# Patient Record
Sex: Female | Born: 1970 | Race: White | Hispanic: No | Smoking: Current every day smoker
Health system: Southern US, Community
[De-identification: ages and names within clinical notes are randomized; demographics above are authoritative.]

## PROBLEM LIST (undated history)

## (undated) DIAGNOSIS — R519 Headache, unspecified: Secondary | ICD-10-CM

## (undated) DIAGNOSIS — Z8619 Personal history of other infectious and parasitic diseases: Secondary | ICD-10-CM

## (undated) DIAGNOSIS — L0390 Cellulitis, unspecified: Secondary | ICD-10-CM

## (undated) DIAGNOSIS — A630 Anogenital (venereal) warts: Secondary | ICD-10-CM

## (undated) DIAGNOSIS — F32A Depression, unspecified: Secondary | ICD-10-CM

## (undated) DIAGNOSIS — F329 Major depressive disorder, single episode, unspecified: Secondary | ICD-10-CM

## (undated) DIAGNOSIS — F3181 Bipolar II disorder: Secondary | ICD-10-CM

## (undated) DIAGNOSIS — L709 Acne, unspecified: Secondary | ICD-10-CM

## (undated) DIAGNOSIS — E2740 Unspecified adrenocortical insufficiency: Secondary | ICD-10-CM

## (undated) DIAGNOSIS — R51 Headache: Secondary | ICD-10-CM

## (undated) DIAGNOSIS — E039 Hypothyroidism, unspecified: Secondary | ICD-10-CM

## (undated) HISTORY — DX: Hypothyroidism, unspecified: E03.9

## (undated) HISTORY — DX: Depression, unspecified: F32.A

## (undated) HISTORY — DX: Personal history of other infectious and parasitic diseases: Z86.19

## (undated) HISTORY — PX: INGUINAL HERNIA REPAIR: SUR1180

## (undated) HISTORY — DX: Major depressive disorder, single episode, unspecified: F32.9

## (undated) HISTORY — PX: TONSILLECTOMY: SUR1361

## (undated) HISTORY — DX: Anogenital (venereal) warts: A63.0

## (undated) HISTORY — PX: UMBILICAL HERNIA REPAIR: SHX196

---

## 1997-12-22 ENCOUNTER — Other Ambulatory Visit: Admission: RE | Admit: 1997-12-22 | Discharge: 1997-12-22 | Payer: Self-pay | Admitting: Obstetrics and Gynecology

## 2006-04-20 ENCOUNTER — Inpatient Hospital Stay (HOSPITAL_COMMUNITY): Admission: AD | Admit: 2006-04-20 | Discharge: 2006-04-27 | Payer: Self-pay | Admitting: Psychiatry

## 2006-04-20 ENCOUNTER — Ambulatory Visit: Payer: Self-pay | Admitting: Psychiatry

## 2009-01-30 ENCOUNTER — Inpatient Hospital Stay (HOSPITAL_COMMUNITY): Admission: AD | Admit: 2009-01-30 | Discharge: 2009-01-30 | Payer: Self-pay | Admitting: Obstetrics & Gynecology

## 2009-02-15 ENCOUNTER — Inpatient Hospital Stay (HOSPITAL_COMMUNITY): Admission: AD | Admit: 2009-02-15 | Discharge: 2009-02-15 | Payer: Self-pay | Admitting: Obstetrics

## 2009-05-12 ENCOUNTER — Encounter (INDEPENDENT_AMBULATORY_CARE_PROVIDER_SITE_OTHER): Payer: Self-pay | Admitting: Obstetrics & Gynecology

## 2009-05-12 ENCOUNTER — Inpatient Hospital Stay (HOSPITAL_COMMUNITY): Admission: AD | Admit: 2009-05-12 | Discharge: 2009-05-15 | Payer: Self-pay | Admitting: Obstetrics & Gynecology

## 2010-06-07 LAB — CBC
HCT: 27.6 % — ABNORMAL LOW (ref 36.0–46.0)
Hemoglobin: 12.3 g/dL (ref 12.0–15.0)
MCV: 99.1 fL (ref 78.0–100.0)
MCV: 99.3 fL (ref 78.0–100.0)
Platelets: 203 10*3/uL (ref 150–400)
Platelets: 261 10*3/uL (ref 150–400)
RBC: 3.62 MIL/uL — ABNORMAL LOW (ref 3.87–5.11)
RDW: 12.6 % (ref 11.5–15.5)
RDW: 12.7 % (ref 11.5–15.5)
WBC: 10.5 10*3/uL (ref 4.0–10.5)
WBC: 8.8 10*3/uL (ref 4.0–10.5)

## 2010-06-07 LAB — RPR: RPR Ser Ql: NONREACTIVE

## 2010-06-15 LAB — FETAL FIBRONECTIN: Fetal Fibronectin: NEGATIVE

## 2010-06-16 LAB — URINALYSIS, ROUTINE W REFLEX MICROSCOPIC
Glucose, UA: NEGATIVE mg/dL
Ketones, ur: NEGATIVE mg/dL
Nitrite: NEGATIVE
Specific Gravity, Urine: 1.01 (ref 1.005–1.030)
Urobilinogen, UA: 0.2 mg/dL (ref 0.0–1.0)

## 2010-07-30 NOTE — Discharge Summary (Signed)
NAMEEMERSON, BARRETTO NO.:  [id number]   MEDICAL RECORD NO.:  [id number]          PATIENT TYPE:  IPS   LOCATION:  0305                          FACILITY:  [facility]   PHYSICIAN:  Anselm Jungling, MD  DATE OF BIRTH:  Dec 30, 1970   DATE OF ADMISSION:  04/20/2006  DATE OF DISCHARGE:  04/27/2006                               DISCHARGE SUMMARY   IDENTIFYING DATA/REASON FOR ADMISSION:  This was an inpatient  psychiatric admission for Tina [name], a 40 year old single white female  admitted because of increasing depression, mood instability, and  irritability.  She was referred by her psychiatrist, Dr. Tomasa [name].  She had recently been begun on a trial of Lamictal, along with Depakote  and p.r.n. Klonopin.  She had a psychiatric history that stemmed back to  her teenage years.  She had had many, many unsuccessful psychotropic  medication trials in the past, including those of Tegretol, imipramine,  and Librium.  Please refer to the admission note for further details  pertaining to the symptoms, circumstances and history that led to her  hospitalization.   INITIAL DIAGNOSTIC IMPRESSION:  She was given an initial AXIS I  diagnosis of bipolar disorder not otherwise specified, depressed versus  mixed, without psychotic features.   MEDICAL/LABORATORY:  The patient was medically and physically assessed  by the psychiatric nurse practitioner.  Admission laboratory included a  negative pregnancy test, and CBC, electrolytes and urinalysis all within  normal limits.  There were no significant medical issues.   HOSPITAL COURSE:  The patient was admitted to the adult inpatient  psychiatric service.  She presented as a well-nourished, well-developed  adult female who was alert and fully oriented.  She was depressed, sad  and tearful.  Her thoughts and speech were normally organized.  There  were no signs or symptoms of psychosis or thought disorder.  She denied  suicidal intent or plan  at the time of admission and throughout the  remainder of her stay.  She verbalized a strong desire for help.   The patient had just been started on Lamictal shortly prior to  admission.  However, her initial Depakote level was subtherapeutic.  Because of this, it was felt that it made more sense to continue  Depakote and get it to therapeutic level, before proceeding ahead with a  full trial of Lamictal.  Also, to address depressive symptoms, she was  started on Prozac 10 mg daily.  Depakote was given at 1500 mg daily.  These appeared to be well-tolerated.   The patient participated in various therapeutic groups and activities  and was a good participant in treatment program.   On the fourth hospital day, there was a family session involving the  patient's husband, sister and mother.  Main concerns were around  providing education to the family and bipolar disorder, and its  medication management, as well as coping skills that the patient could  use to reduce stress after discharge.  Information sheets were given to  the patient and her family.  It was explained to the family that the  current treatment efforts  included adjustment of medications towards  better mood stability.  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  The patient spoke about  being across the street from her sister who also has small children and  utilizing that sister as a strong support.  She also spoke about finding  support groups for her and her family to attend.  The patient also  talked about wanting to find a different psychiatrist, and needing  referral to an individual therapist.  They discussed the possibility of  she getting her services at the [facility].  The family and the  patient were given information on suicide prevention as well as the  suicide prevention pamphlet and the crisis hotline numbers card.   The patient continued in the  program further for four more days.  She  complained during that time that she did not feel she was making much  progress.  However, she remained absent suicidal ideation and, on the  eighth hospital day, indicated that she felt ready for discharge.  She  agreed to the following aftercare plan.  The patient was to follow up at  the [facility], with an appointment on the day after discharge,  April 28, 2006, and another appointment at the [facility]  scheduled for May 09, 2006.   DISCHARGE MEDICATIONS:  1. Depakote ER 1500 mg q.h.s.  2. Prozac 10 mg daily.   DISCHARGE DIAGNOSES:  AXIS I:  Bipolar disorder not otherwise specified,  currently depressed without psychotic features.  AXIS II:  Deferred.  AXIS III:  No acute or chronic illnesses.  AXIS IV:  Stressors:  Severe.  AXIS V:  GAF on discharge 65.      Anselm Jungling, MD  Electronically Signed     SPB/MEDQ  D:  04/30/2006  T:  04/30/2006  Job:  [id number]

## 2010-07-30 NOTE — H&P (Signed)
NAMEARLENY, Tina [name] NO.:  [id number]   MEDICAL RECORD NO.:  [id number]          PATIENT TYPE:  IPS   LOCATION:  0305                          FACILITY:  [facility]   PHYSICIAN:  Anselm Jungling, MD  DATE OF BIRTH:  [birth year]   DATE OF ADMISSION:  04/20/2006  DATE OF DISCHARGE:                       PSYCHIATRIC ADMISSION ASSESSMENT   IDENTIFYING INFORMATION:  This is a 41 year old married white female.  This is a voluntary admission.   HISTORY OF PRESENT ILLNESS:  This 69 year old mother of 2 presented as a  walk-in yesterday referred by her primary outpatient psychiatrist for  severe mood swings, tearfulness, some impulsive anger, and  intrusiveness.  She had been calling his office frequently, crying,  upset.  He felt that she was not safe, possibly out of control.  The  patient herself endorses that she has had some wild mood swings over the  course of the past 2 weeks and is unable to cite any trigger except for  the fact that she had had a medication change, stopping her Wellbutrin  and her Lexapro and starting on Depakote.  She reports agitation,  variable sleep, feeling irritable, angry, a lot of tearfulness.  Yesterday had gotten upset with her husband, threatened to leave, locked  herself in the car and cried, then regretted her actions.  She  attributes the sleep problem also to the possibility of being the mother  of a 50-year-old and stressed out with parenting issues.  She was  agitated and oppositional with the nursing staff last night.  She  endorses some feelings of guarding and paranoia and is remorseful about  that today, believing that the nurses were against her.  She refused to  take medications last night.  She has a history of a prior overdose  attempt as a teen, denies having any suicidal ideation today, but is  concerned about her own impulsivity and mood swings, feeling unsafe.   PAST PSYCHIATRIC HISTORY:  The patient is currently followed  by Dr.  Tiajuana Amass here in [place].  This is her first admission to  [facility].  She does have a history of bipolar  disorder with prior treatment with Tegretol which stabilized her, which  she stopped prior to her last pregnancy about 18 months ago.  Since the  birth of her child a year ago, she was then placed on Lexapro 10 mg and  Wellbutrin XL 300 mg p.o. q.a.m. which was stopped a couple of weeks ago  when she was started on Depakote 1000 mg and within the last couple days  she has added Lamictal.  She has a history of at least 1 prior suicide  attempt as a teen and 2 hospitalizations at [facility] in the past  and a hospitalization in [place] in the past.   FAMILY HISTORY:  Remarkable for sister and mother with a history of  depression.   MEDICAL HISTORY:  The patient's primary care physician is [facility].  Medical problems are essentially none.  Past medical history  is remarkable for 2 prior C-sections.  This  is a healthy white female in  no distress, 5 feet tall, 122 pounds, temperature 97.5, pulse 55,  respirations 16, blood pressure 112/68.   REVIEW OF SYSTEMS:  Remarkable for sleep been decreased to about 1-2  hours at a time.  Hard to know if this is partially attributable to  childcare obligations.  She has a headache which she grades a 6-8/10  generalized across her head.  No throbbing she attributes this to  possible caffeine withdrawal.  Weight is stable.  No history of fever,  chills.  No dysuria, shortness of breath.  Bowels are regular every day  to every other day.  No dysuria, denies muscle aches or pains.  No joint  pains or swelling.  No tobacco use.  The patient denies any drug or  alcohol abuse.  Her weight is relatively stable at 122 pounds.   PAST MEDICAL HISTORY:  Remarkable for 2 C-sections; no history of  blackouts, memory loss, coma, or brain injury.  Hospitalizations for  childbirth only.   FULL  PHYSICAL EXAM:  GENERAL:  Well-nourished, well-developed female in  no acute distress, petite build.  Hygiene is adequate.  HEAD:  Normocephalic.  EENT:  PERRL.  Sclera nonicteric.  Extraocular movements are normal, no  rhinorrhea.  Oropharynx within normal limits.  No a.c. or p.c. nodes  palpable.  NECK:  Supple.  No thyromegaly.  CHEST:  Clear to auscultation.  BREAST EXAM:  Deferred.  CARDIOVASCULAR:  S1-S2 is heard.  No clicks, murmurs, or gallops.  ABDOMEN:  Soft, nontender, nondistended.  GENITOURINARY:  Deferred.  EXTREMITIES:  No clubbing, no cyanosis.  Peripheral pulses are 2+.  SKIN:  Pale in tone.  No signs of self-mutilation, no remarkable  lesions, no rashes.  NEUROLOGIC:  Cranial nerves 2-12 are intact.  Sensory and motor are intact.  Gait is within normal limits.  Romberg  without findings.  Cerebellar functions intact.  Neuro is nonfocal.   DIAGNOSTIC STUDIES:  CBC:  WBC 5.4, hemoglobin 13.2, hematocrit 36.2,  platelets 228,000.  Sodium 142, potassium 3.9, chloride 107, carbon  dioxide 30, BUN 15, creatinine 0.72, and random glucose is 84.  Liver  enzymes:  SGOT 18, SGPT 17, alkaline phosphatase is 65, and total  bilirubin is 0.7.  Urine drug screen negative for all substances.  Urine  pregnancy test is negative.  Depakote level is currently pending, and  routine UA is within normal limits.  TSH 3.802.   CURRENT MEDICATIONS:  1. Depakote 1000 mg ER p.o. q.h.s.  2. Klonopin 0.25 mg p.o. q.6 h p.r.n.  3. Multivitamin daily.  4. Lamictal.  She had been on a starter pack for the last 3 days 25 mg      p.o. every other day.   DRUG ALLERGIES:  VANCOMYCIN, PENICILLIN AND SULFA ALL OF WHICH CAUSE  RASH.   MENTAL STATUS EXAM:  Fully alert female, tearful, mildly agitated,  distractible but cooperative, directable.  Speech is normal in pace,  tone, amount, and production.  She is having some difficulty maintaining her train of thought, though, easily derailed.  Mood is  mildly agitated,  depressed.  Thought process logical, coherent.  Concentration is  decreased.  Calculation is decreased.  Impulse control is impaired.  Judgment is adequate, intellect within normal limits.   AXIS I:  Rule out bipolar disorder NOS, mixed state.  AXIS II:  Deferred.  AXIS III:  Headache NOS.  AXIS IV:  Moderate to severe parenting stress.  AXIS V:  Current  28 past year 72.   PLAN:  Voluntarily admit the patient with q. 15-minute checks in place.  We are going to check an a.m. Depakote level on her fasting.  We are  going to discontinue the Lamictal at this point and will make Geodon 20  mg IM  available for any acute agitation or 80 mg p.o. b.i.d. p.r.n. for acute  agitation until we can get her stabilized on the Depakote which she to.  We will not use any IM medications if she agrees to p.o.'s.  Meanwhile,  we are going to give her some ibuprofen for her headache.  Estimated  length of stay is 5 days.      Margaret A. Lorin Picket, [name]      Anselm Jungling, MD  Electronically Signed    MAS/MEDQ  D:  04/21/2006  T:  04/21/2006  Job:  [id number]

## 2010-09-29 ENCOUNTER — Inpatient Hospital Stay (HOSPITAL_COMMUNITY)
Admission: RE | Admit: 2010-09-29 | Discharge: 2010-10-06 | DRG: 430 | Disposition: A | Discharge status: Home / Self Care | Payer: BC Managed Care – PPO | Attending: Psychiatry | Admitting: Psychiatry

## 2010-09-29 DIAGNOSIS — Z882 Allergy status to sulfonamides status: Secondary | ICD-10-CM

## 2010-09-29 DIAGNOSIS — Z560 Unemployment, unspecified: Secondary | ICD-10-CM

## 2010-09-29 DIAGNOSIS — F3160 Bipolar disorder, current episode mixed, unspecified: Principal | ICD-10-CM

## 2010-09-29 DIAGNOSIS — Z79899 Other long term (current) drug therapy: Secondary | ICD-10-CM

## 2010-09-29 DIAGNOSIS — Z880 Allergy status to penicillin: Secondary | ICD-10-CM

## 2010-09-29 DIAGNOSIS — R45851 Suicidal ideations: Secondary | ICD-10-CM

## 2010-09-29 LAB — CBC
Hemoglobin: 13.5 g/dL (ref 12.0–15.0)
MCV: 87.6 fL (ref 78.0–100.0)
Platelets: 255 10*3/uL (ref 150–400)
RBC: 4.35 MIL/uL (ref 3.87–5.11)
RDW: 13.4 % (ref 11.5–15.5)
WBC: 7.4 10*3/uL (ref 4.0–10.5)

## 2010-09-29 LAB — COMPREHENSIVE METABOLIC PANEL
CO2: 22 mEq/L (ref 19–32)
Calcium: 9.6 mg/dL (ref 8.4–10.5)
Chloride: 104 mEq/L (ref 96–112)
GFR calc non Af Amer: >60 mL/min (ref >60)
Total Protein: 7.4 g/dL (ref 6.0–8.3)

## 2010-09-30 DIAGNOSIS — F3189 Other bipolar disorder: Secondary | ICD-10-CM

## 2010-10-02 LAB — T3 UPTAKE: T3 Uptake Ratio: 31.4 % (ref 22.5–37.0)

## 2010-10-02 LAB — TSH: TSH: 0.015 u[IU]/mL — ABNORMAL LOW (ref 0.350–4.500)

## 2010-10-02 LAB — T4, FREE: Free T4: 0.36 ng/dL — ABNORMAL LOW (ref 0.80–1.80)

## 2010-10-05 NOTE — Assessment & Plan Note (Signed)
Tina [name], PINON NO.:  [id number]  MEDICAL RECORD NO.:  [id number]  LOCATION:  0503                          FACILITY:  [facility]  PHYSICIAN:  [name], MD     DATE OF BIRTH:  [birth year]  DATE OF ADMISSION:  09/29/2010 DATE OF DISCHARGE:                      PSYCHIATRIC ADMISSION ASSESSMENT   REASON FOR ADMISSION:  Patient was admitted to [facility] for evaluation of her depression and possible suicidal thoughts.  She was thinking about cutting herself, reporting stressors at home that she did not elaborate on.  She was reporting problems with increasing sleep and increasing appetite and having severe depressive symptoms.  Denied any psychotic symptoms.  She has been compliant with her medications. Patient reports problems of crying spells, severe irritability, isolation, erratic sleep, feeling hopeless, and suicidal thoughts.  PAST PSYCHIATRIC HISTORY:  First admission to [facility]. She sees Dr. Derrek Gu who is managing her psychotropic medications.  SOCIAL HISTORY:  Patient is married.  She is unemployed.  She resides in [place] and she is a stay-at-home mom.  FAMILY HISTORY:  None.  ALCOHOL AND DRUG HISTORY:  Denies any alcohol or substance use.  PRIMARY CARE PROVIDER:  Dr. Derrek Gu.  MEDICAL PROBLEMS:  No known medical conditions.  MEDICATIONS: 1. Adderall 20 mg taking one b.i.d. 2. Symbyax 6/50 one q.h.s. 3. Topamax 100 mg taking two q.h.s. 4. Lamictal 100 mg taking two q.h.s. 5. Cytomel 50 mg one daily. 6. Wellbutrin XL 450 mg daily. 7. Probiotics daily. 8. Multivitamin daily. 9. Fish oil daily.  PHYSICAL EXAM:  This is a well-nourished middle-aged female who appears in no distress. HEAD:  Atraumatic, normocephalic. NECK:  Negative lymphadenopathy.  Neck is supple. CHEST:  Clear.  No wheezing.  No rales. BREAST:  Exam deferred. HEART:  Regular rate and rhythm.  No murmurs, gallops, or  rubs. ABDOMEN:  Soft, nontender abdomen. PELVIC:  Exam is deferred. GU:  Exam is deferred. EXTREMITIES:  Moves all extremities.  No clubbing.  No deformities. Pulses intact. SKIN:  No rashes or lacerations. NEUROLOGIC:  Findings are intact.  LABORATORY DATA:  Shows a TSH of 0.014.  CBC and CMP are within normal limits.  T4 of 0.36.  MENTAL STATUS EXAM:  Patient is alert and somewhat cooperative, lying in bed.  Her speech is soft spoken.  It took several attempts to get her to arouse.  She has poor eye contact at times and does not provide much history at this time but endorsing stressors.  Her thought process are coherent, goal directed, promises safety.  AXIS I:  Bipolar 1 disorder mixed, under poor control. AXIS II:  Deferred. AXIS III:  No known medical conditions. AXIS IV:  Psychosocial problems related to chronic mental illness. AXIS V:  Current is 35.  PLAN:  Review her medications, contact her support group, address her stressors, and increase her coping skills.  Patient to attend groups.  TENTATIVE LENGTH OF STAY:  At this time, is 4 to 6 days.     [name], [name]   ______________________________ [name], MD    JO/MEDQ  D:  10/03/2010  T:  10/03/2010  Job:  [id number]  Electronically Signed  by Limmie PatriciaP. on 10/05/2010 01:54:48 PM Electronically Signed by [name] MD on 10/05/2010 04:58:32 PM

## 2010-10-08 NOTE — Discharge Summary (Signed)
Tina [name], [name]:  [id number]  MEDICAL RECORD NO.:  [id number]  LOCATION:  0503                          FACILITY:  BH  PHYSICIAN:  [name], MD     DATE OF BIRTH:  [birth year]  DATE OF ADMISSION:  09/29/2010 DATE OF DISCHARGE:  10/06/2010                              DISCHARGE SUMMARY   REASON FOR ADMISSION:  This was a 40 year old female who was admitted for evaluation of her depression and suicidal thoughts.  She was thinking about cutting herself, reporting stressors at home that she did not elaborate on, having problems with increasing sleep, increasing appetite and having severe depressive symptoms, reporting crying spells, severe irritability, isolation, erratic sleep, feeling hopeless and again, suicidal thoughts.  FINAL IMPRESSION:   Axis I:  Bipolar I disorder, mixed, under poor control. Axis II:  Deferred. Axis III:  No known medical conditions. Axis IV:  Psychosocial problems related to chronic mental illness. Axis V:  Current is 55-60.  PERTINENT LABS:  CBC and CMP were within normal limits.  T4 of 0.36 and a TSH of 0.014.  SIGNIFICANT FINDINGS:  The patient was admitted to the adult milieu for safety and stabilization.  We reviewed her medications. Continued to identify her stressors and increased coping skills.  We contacted her family for support and concerns.  She was reporting good sleep.  Her appetite was decreased.  Having severe depressive symptoms, rating it a 10 on a scale of 1-10.  Had no suicidal thoughts on July 20th.  No psychotic symptoms.  She was tolerating her medications.  We initiated Effexor 37.5 mg to address her depressive symptoms and continued to monitor her mood and affect.  We contacted her husband. Reuel Boom, to assess any safety concerns and for [place] to provide information.  Husband reported that the patient was "fixated" on cutting herself and did not see much progress at that time and reported that  the patient was not normally suicidal.  On the weekend prior to discharge, the patient's sleep was improving.  Her appetite was improving.  She was rating her depression a 5.  Had no suicidal thoughts at that time, but she did state that she gets suicidal when she gets upset.  She denied any homicidal ideation.  On Sunday, she was feeling down with no reason, although she slept well and her appetite was good. She had no medication side effects.  She states that she did get angry at her sister who had told others that she was admitted to the hospital.  On July 24th, the patient's sleep was good.  Her appetite was good. Rating her depression a 1-1/2 on a scale of 1-10.  Denied any suicidal or homicidal thoughts.  She was expressing some anxiety that her good mood will go away.  We had some Klonopin available as needed for anxiety and continued with her other medication regimen.  Endocrinology was contacted to review the patient's thyroid results and to gather any further recommendations.  They felt that discontinuing her Cytomel and placing the patient on Armour Thyroid and having her thyroid values rechecked in approximately 4-6 weeks was appropriate.  On day of discharge, the  patient was seen in the interdisciplinary treatment team to address any safety issues.  The patient felt she was ready to be discharged.  She was reporting some anxiety about going home, that her depression would return, but she was reporting good sleep and good appetite.  Her depression had resolved, rating it a 1, adamantly denying any suicidal or homicidal thoughts.  Denying any auditory hallucinations or delusional thinking.  Her anxiety was mild, rating it a 3, and having no medication side effects.  DISCHARGE MEDICATIONS:  Included: 1. Klonopin 0.5 mg taking 1/2 tablet q.8 hours p.r.n. for anxiety. 2. Prozac 10 mg 1 daily that she was to take along with her Symbyax. 3. Armour Thyroid 30 mg 1 tablet daily. 4.  Effexor XR 75 mg 1 daily. 5. Wellbutrin XL 450 mg total. 6. Adderall 20 mg 1 b.i.d. 7. Lamictal 100 mg taking 2 q.h.s. 8. Multivitamin daily. 9. Home medicine of n-acetylcysteine 1000 mg b.i.d. 10.Symbyax 6/50 one q.h.s. 11.Topamax 100 mg taking 2 q.h.s.  The patient was to stop taking her Cytomel.  FOLLOWUP APPOINTMENT:  With [facility] with Dr. Dub Mikes with an appointment on November 16, 2010, and [facility] on Thursday, July 26, at 10:30.     Tina [name], [name]   ______________________________ [name], MD    JO/MEDQ  D:  10/07/2010  T:  10/07/2010  Job:  [id number]  Electronically Signed by Limmie PatriciaP. on 10/08/2010 10:51:01 AM Electronically Signed by [name] MD on 10/08/2010 04:32:55 PM

## 2010-10-17 ENCOUNTER — Ambulatory Visit (HOSPITAL_COMMUNITY)
Admission: RE | Admit: 2010-10-17 | Discharge: 2010-10-17 | Disposition: A | Discharge status: Home / Self Care | Payer: BC Managed Care – PPO | Source: Ambulatory Visit | Attending: Psychiatry | Admitting: Psychiatry

## 2010-10-17 ENCOUNTER — Emergency Department (HOSPITAL_COMMUNITY)
Admission: EM | Admit: 2010-10-17 | Discharge: 2010-10-18 | Disposition: A | Discharge status: Other Acute Inpatient Hospital | Payer: BC Managed Care – PPO | Attending: Emergency Medicine | Admitting: Emergency Medicine

## 2010-10-17 DIAGNOSIS — X789XXA Intentional self-harm by unspecified sharp object, initial encounter: Secondary | ICD-10-CM | POA: Insufficient documentation

## 2010-10-17 DIAGNOSIS — S61509A Unspecified open wound of unspecified wrist, initial encounter: Secondary | ICD-10-CM | POA: Insufficient documentation

## 2010-10-17 DIAGNOSIS — R45851 Suicidal ideations: Secondary | ICD-10-CM | POA: Insufficient documentation

## 2010-10-17 DIAGNOSIS — F315 Bipolar disorder, current episode depressed, severe, with psychotic features: Secondary | ICD-10-CM | POA: Insufficient documentation

## 2010-10-17 DIAGNOSIS — F3130 Bipolar disorder, current episode depressed, mild or moderate severity, unspecified: Secondary | ICD-10-CM | POA: Insufficient documentation

## 2010-10-17 LAB — URINALYSIS, ROUTINE W REFLEX MICROSCOPIC
Bilirubin Urine: NEGATIVE
Glucose, UA: NEGATIVE mg/dL
Hgb urine dipstick: NEGATIVE
Ketones, ur: NEGATIVE mg/dL
Leukocytes, UA: NEGATIVE
Nitrite: NEGATIVE
Protein, ur: NEGATIVE mg/dL
Specific Gravity, Urine: 1.01 (ref 1.005–1.030)
Urobilinogen, UA: 0.2 mg/dL (ref 0.0–1.0)
pH: 7 (ref 5.0–8.0)

## 2010-10-17 LAB — RAPID URINE DRUG SCREEN, HOSP PERFORMED
Amphetamines: POSITIVE — AB
Barbiturates: NOT DETECTED
Benzodiazepines: NOT DETECTED
Cocaine: NOT DETECTED
Opiates: NOT DETECTED
Tetrahydrocannabinol: NOT DETECTED

## 2010-10-17 LAB — POCT PREGNANCY, URINE: Preg Test, Ur: NEGATIVE

## 2010-10-18 ENCOUNTER — Inpatient Hospital Stay (HOSPITAL_COMMUNITY)
Admission: AD | Admit: 2010-10-18 | Discharge: 2010-10-25 | DRG: 430 | Disposition: A | Discharge status: Home / Self Care | Payer: BC Managed Care – PPO | Source: Ambulatory Visit | Attending: Psychiatry | Admitting: Psychiatry

## 2010-10-18 DIAGNOSIS — E039 Hypothyroidism, unspecified: Secondary | ICD-10-CM

## 2010-10-18 DIAGNOSIS — F3160 Bipolar disorder, current episode mixed, unspecified: Principal | ICD-10-CM

## 2010-10-18 DIAGNOSIS — Z882 Allergy status to sulfonamides status: Secondary | ICD-10-CM

## 2010-10-18 DIAGNOSIS — Z880 Allergy status to penicillin: Secondary | ICD-10-CM

## 2010-10-18 DIAGNOSIS — X838XXA Intentional self-harm by other specified means, initial encounter: Secondary | ICD-10-CM

## 2010-10-18 DIAGNOSIS — S61509A Unspecified open wound of unspecified wrist, initial encounter: Secondary | ICD-10-CM

## 2010-10-19 DIAGNOSIS — F3160 Bipolar disorder, current episode mixed, unspecified: Secondary | ICD-10-CM

## 2010-10-19 LAB — DIFFERENTIAL
Basophils Relative: 1 % (ref 0–1)
Eosinophils Absolute: 0 10*3/uL (ref 0.0–0.7)
Lymphocytes Relative: 31 % (ref 12–46)
Monocytes Relative: 9 % (ref 3–12)
Neutro Abs: 3.7 10*3/uL (ref 1.7–7.7)
Neutrophils Relative %: 60 % (ref 43–77)

## 2010-10-19 LAB — COMPREHENSIVE METABOLIC PANEL
ALT: 14 U/L (ref 0–35)
Alkaline Phosphatase: 81 U/L (ref 39–117)
Calcium: 10 mg/dL (ref 8.4–10.5)
Creatinine, Ser: 0.81 mg/dL (ref 0.50–1.10)
GFR calc Af Amer: >60 mL/min (ref >60)
Glucose, Bld: 96 mg/dL (ref 70–99)
Potassium: 4.4 mEq/L (ref 3.5–5.1)
Sodium: 139 mEq/L (ref 135–145)
Total Bilirubin: 0.2 mg/dL — ABNORMAL LOW (ref 0.3–1.2)
Total Protein: 7.2 g/dL (ref 6.0–8.3)

## 2010-10-19 LAB — CBC
Hemoglobin: 12.4 g/dL (ref 12.0–15.0)
MCH: 29.7 pg (ref 26.0–34.0)
MCHC: 32.5 g/dL (ref 30.0–36.0)
MCV: 91.4 fL (ref 78.0–100.0)
RDW: 13.5 % (ref 11.5–15.5)

## 2010-10-20 LAB — T4, FREE: Free T4: 0.59 ng/dL — ABNORMAL LOW (ref 0.80–1.80)

## 2010-10-20 LAB — T3, FREE: T3, Free: 1.9 pg/mL — ABNORMAL LOW (ref 2.3–4.2)

## 2010-10-20 NOTE — Assessment & Plan Note (Signed)
[name], BOROFF NO.:  [id number]  MEDICAL RECORD NO.:  [id number]  LOCATION:  0503                          FACILITY:  BH  PHYSICIAN:  [name], MD     DATE OF BIRTH:  02-13-1971  DATE OF ADMISSION:  10/18/2010 DATE OF DISCHARGE:                      PSYCHIATRIC ADMISSION ASSESSMENT   CHIEF COMPLAINT:  "I tried to kill myself."  HISTORY OF PRESENT ILLNESS:  [name] is a 40 year old married white female who presents to Behavioral Health  for readmission for evaluation of depression with suicidal ideations as well as anxiety.  The patient was recently hospitalized at [facility]  from October 03, 2010 to October 07, 2010 for a similar presentation.  The patient states that after her discharge she began to experience some rapid mood swings.  She states that she may be happy one  minute and then angry the next. She reports that over the weekend she was at the beach with family and began to feel that others were angry at her.  She states that this made the vacation uncomfortable and on Saturday she and her husband and the family returned home.  She states that Saturday night she took 4 mg of her Klonopin but did not notify her husband.  She states at that time she was attempting to sleep.  She reports that Sunday she was making waffles her son who asked her "Mom, why are you smiling."  She states she became very angry thinking that her son should not have to ask her why she is happy and that she should be happy all the time.  She states she began to think that her kids have a "crazy mother" and they would be better off without her.  She reports she went to the bathroom in her bedroom and locked the door and cut her left wrist.  She reports that her husband noticed that she had been in the bathroom and insisted that she come out at which time he discovered she has cut her wrist and brought her to the emergency department for evaluation.  She was  readmitted to [facility].  Currently the patient reports that she is having difficulty initiating and maintaining sleep, but reports that prior to admission she was having an increased desire to sleep.  She reports a good appetite and reports moderate feelings of sadness, anhedonia and depressed mood.  She reports frequent crying spells as well as periods of anger and "being mad at myself."  She also reported episodic suicidal ideations but without plan or intent.  The patient reports to experiencing moderate anxiety symptoms, stating that she feels angry at herself and the world and feels she needs to "take her anger out on someone."  The patient denies any auditory or visual hallucinations or delusional thinking.  She also denies any substance abuse history.  She presents for evaluation of her "mood swings" as well as her irritability and anxiety and suicidal ideations.  PAST PSYCHIATRIC HISTORY:  The patient has two  previous psychiatric hospitalizations at [facility],  one in February 2008 and most recently in July 2012 for similar presentation.  She is scheduled to see Dr. Dub Mikes for  outpatient care in September.  PAST MEDICAL HISTORY:  Current medications - 1. Adderall 20 mg p.o. q. 7 a.m. and 2 p.m. 2. Wellbutrin XL 450 mg p.o. in the morning. 3. Prozac 10 mg p.o. in the morning . 4. N-Acetylcysteine  1000 mg p.o. b.i.d. 5. Lamictal 200 mg p.o. at bedtime. 6. Symbyax 6/50 mg p.o. in the morning. 7. Armour Thyroid 30 mg p.o. in the morning. 8. Topamax 200 mg p.o. at bedtime. 9. Effexor XR 75 mg p.o. in the morning. 10.Klonopin 0.25 mg p.o. q.8 hours  - p.r.n. for anxiety.  ALLERGIES: 1. PENICILLIN - HIVES. 2. SULFA - RASH. 3. VANCOMYCIN - HEAD AND SCALP ITCHING.  PAST MEDICAL HISTORY:   No acute illnesses reported.  PAST SURGICAL HISTORY:  Cesarean sections x2.  FAMILY HISTORY:  The patient reports that both her sister and her mother have a  history of depression.  SOCIAL HISTORY:  The patient currently lives in [place] with her husband  and three children.  She is currently unemployed.  She is a stay-at-home mom.  She denies any past or current substance related issues.  MENTAL STATUS EXAM:  General - the patient was alert and oriented x3 and was cooperative with this provider.  Speech was appropriate in terms of rate and volume and no pressuring noted.  Mood appeared moderately depressed.  Affect was moderately constricted.  Thoughts - the patient denied any auditory or visual hallucinations or delusional thinking. She did report episodic suicidal ideations but with current plan or intent.  She denied any homicidal ideations.  Judgment and insight both appeared fair.  IMPRESSION: Axis I - Bipolar I  disorder - mixed - currently under fair to poor control. AXIS II:  - Deferred. AXIS III:  - No acute illnesses reported. AXIS IV:  - Chronic serious mental illness. AXIS V:  - GAF at time of admission approximately 35.  Highest GAF in past year approximately 60.  PLAN: 1. The patient was continued on the medication Adderall 20 mg p.o. q.     7 a.m. and 2:00 p.m. for ADHD. 2. The patient was continued on medication Wellbutrin XL at 450 mg     p.o. in the morning  for depression. 3. The patient was continued on medication Prozac 10 mg p.o. in the     morning  for depression. 4. The patient was continued on the medication Lamictal at 200 mg p.o.     at bedtime  for mood stabilizer. 5. The patient was continued on Symbyax  at 6/50 mg p.o. in the     morning  as a mood stabilizer and for depression. 6. The patient was continued on Armour Thyroid 30 mg p.o. in the     morning  to augment her antidepressant and to address possible     hypothyroidism. 7. The patient was continued on Topamax at 200 mg p.o. at bedtime  for     migraine headaches. 8. The patient was continued on Effexor XR 75 mg p.o. in the morning      also for depression. 9. The patient was continued on Klonopin 0.25 mg p.o. q.8 hours  -     p.r.n. for anxiety. 10.The patient was started on the medication lithium carbonate 300 mg     p.o. in the morning  to help with her mood stabilization. 11.The patient was continued on her home medication of N - N-     Acetylcysteine 1000 mg p.o. b.i.d. 12.The patient will continue  to be monitored for dangerousness to self     and/or others. 13.A TSH, free T3 and free T4 as well as a CMP and CBC with     differential was ordered today to assess her metabolic status. 14.The patient will participate in unit in group activities per unit     routine.    __________________________________ [name], MD     RR/MEDQ  D:  10/19/2010  T:  10/20/2010  Job:  [id number]  Electronically Signed by [name] MD on 10/20/2010 04:55:05 PM

## 2010-10-25 ENCOUNTER — Encounter (HOSPITAL_COMMUNITY): Payer: BC Managed Care – PPO | Admitting: Psychiatry

## 2010-10-26 ENCOUNTER — Encounter (HOSPITAL_COMMUNITY): Payer: BC Managed Care – PPO | Attending: Psychiatry | Admitting: Psychiatry

## 2010-10-26 DIAGNOSIS — R45851 Suicidal ideations: Secondary | ICD-10-CM | POA: Insufficient documentation

## 2010-10-26 DIAGNOSIS — E039 Hypothyroidism, unspecified: Secondary | ICD-10-CM | POA: Insufficient documentation

## 2010-10-26 DIAGNOSIS — F3160 Bipolar disorder, current episode mixed, unspecified: Secondary | ICD-10-CM | POA: Insufficient documentation

## 2010-10-27 ENCOUNTER — Encounter (HOSPITAL_COMMUNITY): Payer: BC Managed Care – PPO | Admitting: Psychiatry

## 2010-10-27 NOTE — Discharge Summary (Signed)
Tina [name], Tina [name] NO.:  [id number]  MEDICAL RECORD NO.:  [id number]  LOCATION:  0503                          FACILITY:  BH  PHYSICIAN:  [name], MD     DATE OF BIRTH:  July 05, 1970  DATE OF ADMISSION:  10/18/2010 DATE OF DISCHARGE:  10/25/2010                              DISCHARGE SUMMARY   REASON FOR ADMISSION:  This is a 40 year old female that presented for evaluation of her depression with suicidal ideations as well as anxiety. She was experiencing rapid mood swings.  She had cut her left wrist after the recent incident where her son asked her why she was smiling when she was cooking breakfast for him.  FINAL IMPRESSION:  Axis I:  Bipolar I disorder, mixed, currently under good control. Axis II:  Deferred. Axis III:  No acute illnesses, hypothyroidism. Axis IV: Chronic mental illness. Axis V:  GAF is 65.  PERTINENT LABS:  Urine pregnancy test was negative.  Urinalysis was negative.  Her urine drug screen was positive for amphetamines.  She had a 2 cm superficial laceration to left volar wrist that required suturing.  SIGNIFICANT FINDINGS:  The patient was admitted to the adult milieu for safety and stabilization.  We reviewed her medications.  We continued her psychotropic medications and started her on lithium carbonate 300 mg to help with mood stabilization.  We also ordered a TSH, free T3 and T4, a  CBC and CMP to address her metabolic status. .  The patient was reporting problems with "fitful sleep," with a good appetite, having mild depressive symptoms and crying spells, feeling very mad, angry and irritable, feeling emotionally cold, reporting her hopelessness as 7. She denied any psychotic symptoms and was having no medication side effects.  We changed her Symbyax to Prozac at bedtime and Zyprexa 10 mg at bedtime as a mood stabilizer.  We also had Ativan available for anxiety and irritability.  Her lithium at the time was  discontinued due to weight gain and for the thyroid issues and increased her Armour Thyroid to 60 mg.  The following day, her sleep was better but she had a hard time going to sleep.  Her hopelessness was rated a 5 on a scale of 1-10.  The patient want to get off her Effexor, finding that it made her angry and paranoid, and at that time we changed her Prozac to 60 mg daily.  There was contact with the patient's husband to assess any safety concerns and he had concerns about the patient's paranoia, whether she may become suicidal again.  On August 10, we discontinued her Effexor and increased her Ativan to help with anxiety. Over the weekend, the patient was reporting good sleep and good appetite, rating her depression a 1 on a scale 1-10, having no anxiety.  She said she felt a little paranoid when her husband came to lunch and continued to have a little anxiety over discontinuing her Effexor.  We removed her sutures, incision looked intact and clean.  On day of discharge, the patient was seen in the interdisciplinary treatment team.  Her sleep was good appetite was good.  Her depression had resolved, rating  it a 1 on a scale of 1-10.  Denied any suicidal or homicidal thoughts or auditory hallucinations.  No delusional thinking, having mild to moderate anxiety.  Was reporting that she was scared to go home and not sure what she would do, although she had a good plan to call and was anxious to go to the IOP program.  She did get a little tearful at the end, related to her anxiety of being discharge and returning back home.  DISCHARGE MEDICATIONS: 1. Prozac 20 mg taking 3 daily. 2. Lorazepam 1 mg one half to 1 tablet every 8 hours as needed for     anxiety. 3. Zyprexa 10 mg q.h.s. 4. Thyroid 30 mg taking 2 daily 5. Adderall 20 mg one b.i.d.. 6. Lamictal 100 mg taking two at bedtime. 7. Multivitamin one daily. 8. Topamax 100 mg taking 2 at bedtime. 9. Wellbutrin XL total 450 mg  daily. 10.N-acetylcysteine 1000 mg b.i.d.  The patient was to stop taking her Symbyax, Klonopin and Effexor.  FOLLOW-UP: 1. Appointment with Michaelle Copas at [facility], phone     number [phone]. 2. Dr. Dub Mikes on Tuesday September 4 at mental health IOP at phone     number [phone], beginning Tuesday August 14.     [name], [name]   ______________________________ [name], MD    JO/MEDQ  D:  10/26/2010  T:  10/27/2010  Job:  [id number]  Electronically Signed by Limmie Patricia.P. on 10/27/2010 08:54:17 AM Electronically Signed by [name] MD on 10/27/2010 04:06:55 PM

## 2010-10-28 ENCOUNTER — Encounter (HOSPITAL_COMMUNITY): Payer: BC Managed Care – PPO | Admitting: Psychiatry

## 2010-10-29 ENCOUNTER — Encounter (HOSPITAL_COMMUNITY): Payer: BC Managed Care – PPO | Admitting: Psychiatry

## 2010-11-01 ENCOUNTER — Telehealth: Payer: Self-pay | Admitting: Endocrinology

## 2010-11-01 ENCOUNTER — Encounter (HOSPITAL_COMMUNITY): Payer: BC Managed Care – PPO | Admitting: Psychiatry

## 2010-11-01 ENCOUNTER — Inpatient Hospital Stay (HOSPITAL_COMMUNITY)
Admission: RE | Admit: 2010-11-01 | Discharge: 2010-11-15 | DRG: 430 | Disposition: A | Discharge status: Home / Self Care | Payer: BC Managed Care – PPO | Attending: Psychiatry | Admitting: Psychiatry

## 2010-11-01 DIAGNOSIS — E039 Hypothyroidism, unspecified: Secondary | ICD-10-CM

## 2010-11-01 DIAGNOSIS — F3160 Bipolar disorder, current episode mixed, unspecified: Principal | ICD-10-CM

## 2010-11-01 DIAGNOSIS — R110 Nausea: Secondary | ICD-10-CM

## 2010-11-01 DIAGNOSIS — R45851 Suicidal ideations: Secondary | ICD-10-CM

## 2010-11-01 DIAGNOSIS — Z560 Unemployment, unspecified: Secondary | ICD-10-CM

## 2010-11-01 NOTE — Telephone Encounter (Signed)
Forwarded to Dr. Ellison for review. °

## 2010-11-02 ENCOUNTER — Encounter (HOSPITAL_COMMUNITY): Payer: BC Managed Care – PPO | Admitting: Psychiatry

## 2010-11-02 DIAGNOSIS — F3160 Bipolar disorder, current episode mixed, unspecified: Secondary | ICD-10-CM

## 2010-11-02 LAB — COMPREHENSIVE METABOLIC PANEL
ALT: 13 U/L (ref 0–35)
Albumin: 3.6 g/dL (ref 3.5–5.2)
Alkaline Phosphatase: 86 U/L (ref 39–117)
BUN: 16 mg/dL (ref 6–23)
Calcium: 9.2 mg/dL (ref 8.4–10.5)
GFR calc Af Amer: >60 mL/min (ref >60)
Potassium: 3.6 mEq/L (ref 3.5–5.1)
Sodium: 138 mEq/L (ref 135–145)
Total Protein: 7 g/dL (ref 6.0–8.3)

## 2010-11-02 LAB — CBC
MCH: 30.6 pg (ref 26.0–34.0)
MCHC: 33.6 g/dL (ref 30.0–36.0)
RDW: 13.6 % (ref 11.5–15.5)

## 2010-11-02 LAB — ETHANOL: Alcohol, Ethyl (B): <11 mg/dL (ref 0–11)

## 2010-11-03 ENCOUNTER — Encounter (HOSPITAL_COMMUNITY): Payer: BC Managed Care – PPO | Admitting: Psychiatry

## 2010-11-03 LAB — URINALYSIS, ROUTINE W REFLEX MICROSCOPIC
Hgb urine dipstick: NEGATIVE
Leukocytes, UA: NEGATIVE
Nitrite: NEGATIVE
Protein, ur: NEGATIVE mg/dL
Specific Gravity, Urine: 1.013 (ref 1.005–1.030)
Urobilinogen, UA: 0.2 mg/dL (ref 0.0–1.0)

## 2010-11-03 LAB — URINE DRUGS OF ABUSE SCREEN W ALC, ROUTINE (REF LAB)
Amphetamine Screen, Ur: NEGATIVE
Cocaine Metabolites: NEGATIVE
Creatinine,U: 54.2 mg/dL
Ethyl Alcohol: <10 mg/dL (ref <10)
Marijuana Metabolite: NEGATIVE
Opiate Screen, Urine: NEGATIVE
Propoxyphene: NEGATIVE

## 2010-11-04 ENCOUNTER — Ambulatory Visit (HOSPITAL_COMMUNITY): Payer: BC Managed Care – PPO | Attending: Psychiatry

## 2010-11-04 ENCOUNTER — Encounter (HOSPITAL_COMMUNITY): Payer: BC Managed Care – PPO | Admitting: Psychiatry

## 2010-11-04 DIAGNOSIS — H539 Unspecified visual disturbance: Secondary | ICD-10-CM | POA: Insufficient documentation

## 2010-11-04 LAB — T4, FREE: Free T4: 0.54 ng/dL — ABNORMAL LOW (ref 0.80–1.80)

## 2010-11-04 LAB — TSH: TSH: 1.647 u[IU]/mL (ref 0.350–4.500)

## 2010-11-04 MED ORDER — GADOBENATE DIMEGLUMINE 529 MG/ML IV SOLN: 7.0000 mL | Freq: Once | INTRAVENOUS | Status: AC | PRN

## 2010-11-04 MED ORDER — GADOBENATE DIMEGLUMINE 529 MG/ML IV SOLN
7.0000 mL | Freq: Once | INTRAVENOUS | Status: AC
  Administered 2010-11-04: 7 mL via INTRAVENOUS

## 2010-11-05 ENCOUNTER — Encounter (HOSPITAL_COMMUNITY): Payer: BC Managed Care – PPO | Admitting: Psychiatry

## 2010-11-05 LAB — DIFFERENTIAL
Basophils Absolute: 0 10*3/uL (ref 0.0–0.1)
Basophils Relative: 1 % (ref 0–1)
Eosinophils Absolute: 0 10*3/uL (ref 0.0–0.7)
Eosinophils Relative: 0 % (ref 0–5)
Monocytes Absolute: 0.5 10*3/uL (ref 0.1–1.0)
Monocytes Relative: 9 % (ref 3–12)
Neutro Abs: 3.4 10*3/uL (ref 1.7–7.7)

## 2010-11-05 LAB — CBC
Hemoglobin: 12 g/dL (ref 12.0–15.0)
MCH: 31.1 pg (ref 26.0–34.0)
MCHC: 33.9 g/dL (ref 30.0–36.0)
RDW: 13.5 % (ref 11.5–15.5)

## 2010-11-05 LAB — COMPREHENSIVE METABOLIC PANEL
ALT: 17 U/L (ref 0–35)
Albumin: 3.6 g/dL (ref 3.5–5.2)
Calcium: 9.7 mg/dL (ref 8.4–10.5)
GFR calc Af Amer: >60 mL/min (ref >60)
Glucose, Bld: 99 mg/dL (ref 70–99)
Potassium: 3.5 mEq/L (ref 3.5–5.1)
Sodium: 137 mEq/L (ref 135–145)
Total Protein: 7.4 g/dL (ref 6.0–8.3)

## 2010-11-06 LAB — LUTEINIZING HORMONE: LH: 4.1 m[IU]/mL

## 2010-11-06 LAB — PROGESTERONE: Progesterone: 0.2 ng/mL

## 2010-11-06 LAB — FOLLICLE STIMULATING HORMONE: FSH: 5.4 m[IU]/mL

## 2010-11-08 ENCOUNTER — Encounter (HOSPITAL_COMMUNITY): Payer: BC Managed Care – PPO | Admitting: Psychiatry

## 2010-11-09 ENCOUNTER — Encounter (HOSPITAL_COMMUNITY): Payer: BC Managed Care – PPO | Admitting: Psychiatry

## 2010-11-10 ENCOUNTER — Encounter (HOSPITAL_COMMUNITY): Payer: BC Managed Care – PPO | Admitting: Psychiatry

## 2010-11-10 LAB — COMPREHENSIVE METABOLIC PANEL
ALT: 22 U/L (ref 0–35)
AST: 19 U/L (ref 0–37)
Albumin: 3.8 g/dL (ref 3.5–5.2)
Alkaline Phosphatase: 90 U/L (ref 39–117)
Chloride: 102 mEq/L (ref 96–112)
Potassium: 3.8 mEq/L (ref 3.5–5.1)
Sodium: 134 mEq/L — ABNORMAL LOW (ref 135–145)
Total Bilirubin: 0.2 mg/dL — ABNORMAL LOW (ref 0.3–1.2)

## 2010-11-10 LAB — DIFFERENTIAL
Basophils Absolute: 0 10*3/uL (ref 0.0–0.1)
Eosinophils Absolute: 0 10*3/uL (ref 0.0–0.7)
Eosinophils Relative: 0 % (ref 0–5)
Lymphocytes Relative: 26 % (ref 12–46)
Neutrophils Relative %: 67 % (ref 43–77)

## 2010-11-10 LAB — CBC
Platelets: 308 10*3/uL (ref 150–400)
RBC: 3.95 MIL/uL (ref 3.87–5.11)
RDW: 13.6 % (ref 11.5–15.5)
WBC: 6.3 10*3/uL (ref 4.0–10.5)

## 2010-11-10 LAB — T4, FREE: Free T4: 0.6 ng/dL — ABNORMAL LOW (ref 0.80–1.80)

## 2010-11-11 ENCOUNTER — Encounter (HOSPITAL_COMMUNITY): Payer: BC Managed Care – PPO | Admitting: Psychiatry

## 2010-11-12 ENCOUNTER — Encounter (HOSPITAL_COMMUNITY): Payer: BC Managed Care – PPO | Admitting: Psychiatry

## 2010-11-13 DIAGNOSIS — F319 Bipolar disorder, unspecified: Secondary | ICD-10-CM

## 2010-11-14 LAB — CBC
MCH: 31.4 pg (ref 26.0–34.0)
MCHC: 34.6 g/dL (ref 30.0–36.0)
Platelets: 287 10*3/uL (ref 150–400)

## 2010-11-14 LAB — DIFFERENTIAL
Basophils Absolute: 0 10*3/uL (ref 0.0–0.1)
Basophils Relative: 1 % (ref 0–1)
Eosinophils Absolute: 0 10*3/uL (ref 0.0–0.7)
Monocytes Absolute: 0.4 10*3/uL (ref 0.1–1.0)
Monocytes Relative: 8 % (ref 3–12)
Neutrophils Relative %: 62 % (ref 43–77)

## 2010-11-14 LAB — COMPREHENSIVE METABOLIC PANEL
ALT: 14 U/L (ref 0–35)
AST: 15 U/L (ref 0–37)
Albumin: 3.5 g/dL (ref 3.5–5.2)
Alkaline Phosphatase: 76 U/L (ref 39–117)
Calcium: 8.6 mg/dL (ref 8.4–10.5)
Potassium: 3.9 mEq/L (ref 3.5–5.1)
Sodium: 130 mEq/L — ABNORMAL LOW (ref 135–145)
Total Protein: 6.9 g/dL (ref 6.0–8.3)

## 2010-11-16 ENCOUNTER — Ambulatory Visit: Payer: BC Managed Care – PPO | Admitting: Endocrinology

## 2010-11-16 ENCOUNTER — Encounter (HOSPITAL_COMMUNITY): Payer: BC Managed Care – PPO | Admitting: Psychiatry

## 2010-11-17 ENCOUNTER — Encounter (HOSPITAL_COMMUNITY): Payer: BC Managed Care – PPO | Admitting: Psychiatry

## 2010-11-18 ENCOUNTER — Encounter (HOSPITAL_COMMUNITY): Payer: BC Managed Care – PPO | Admitting: Psychiatry

## 2010-11-18 NOTE — Discharge Summary (Signed)
NAMEFELISIA, BALCOM NO.:  [id number]  MEDICAL RECORD NO.:  [id number]  LOCATION:  0501                          FACILITY:  BH  PHYSICIAN:  [name], MD     DATE OF BIRTH:  [birth year]  DATE OF ADMISSION:  11/01/2010 DATE OF DISCHARGE:  11/15/2010                              DISCHARGE SUMMARY   REASON FOR ADMISSION:  This was a 40 year old female who presented with an increase in suicidal thoughts over the past 3 days.  She reported feeling more and more depressed and was having strong urges to cut herself.  She did the day prior to admission make six superficial scratches to her wrist with a  box cutter.  She was reporting problems with increasing sadness, depressed mood, crying spells and feeling very anxious.  FINAL IMPRESSION:  Axis I:  Bipolar 1 disorder, mixed. AXIS II:  Deferred. Axis III:  Hypothyroidism. Axis IV:  Chronic mental illness. AXIS V:  GAF at discharge was 70.  PERTINENT LABORATORIES:  Hemoglobin 11.6, platelets 215,000.  Liver enzymes were normal.  Alcohol screen was negative.  The patient was admitted to the adult milieu for safety and stabilization.  We resumed her previous medications and continued to gather more information from her family.  She was reporting problems with sleep, feeling very restless, having a good appetite, having severe depressive symptoms, rating it a 9 on a scale of 1-10, having suicidal thoughts but no plan or intent.  She was rating her anxiety moderate, or a 5, and was having no medication side effects.  We decreased her Wellbutrin at this time, increased her Prozac to lessen her depressive symptoms, decreased her Lamictal to 200 mg at bedtime, and added Trileptal for a mood stabilizer.  The patient was attending group and actively participating.  She was reporting lots of dreams but reported good appetite and on August 23 did not have any suicidal thoughts and her anxiety was less.  We increased  her Trileptal at the time and also increased her Armour Thyroid.  We began to taper her Lamictal and contacted her OB/GYN doctor and ordered further labs.  We contacted her husband to address any safety issues and for [place] to provide information. We did an MRI of her head, which showed that her pituitary gland was grossly normal in size and had no other findings.  The patient was beginning to sleep better but still having some mood swings with a lot of anxiety.  She was having some difficulty with nausea, and we had Zofran available for her.  She continued to endorse occasional suicidal thoughts but no plan or intent.  We initiated Yaz as recommended by her OB/GYN for possible hormonal stabilization.  The patient continued to have fluctuating anxiety and we initiated Neurontin to lessen her anxiety and to help promote mood stabilization.  We also discontinued her Ativan at that time and increased her Trileptal.  The patient had decided to go to a treatment center in [place] with the name [facility], which is a 30-day mental health treatment in [place], and husband was to transport the patient to this facility.  On the day of discharge, the  patient's sleep was good.  Her appetite was good.  Her depression had resolved, rating it a 1 on a scale of 1-10, adamantly denying any suicidal or homicidal thoughts or psychotic symptoms.  She had no medication side effects.  Her discharge medications included ferrous sulfate 1 daily, Trileptal 300 mg 1 daily and 2 at bedtime, Adderall 20 mg 1 b.i.d., Armour Thyroid 30 mg taking 3 daily, fluoxetine 40 mg taking 2 daily, Lamictal 100 mg taking 1-1/2 q.h.s., Wellbutrin XL 300 mg 1 daily.  The patient was to continue her fish oil, over-the-counter multivitamins, olanzapine 10 mg q.h.s., Topamax 100 mg 2 at bedtime.  She was to stop taking her lorazepam.  Her followup appointment was with Lottie Dawson at phone number [phone]- [phone], in [place], [place].   The patient was to arrive there on September 4.     [name], [name]   ______________________________ [name], MD    JO/MEDQ  D:  11/17/2010  T:  11/17/2010  Job:  [id number]  Electronically Signed by Limmie PatriciaP. on 11/18/2010 09:09:40 AM Electronically Signed by [name] MD on 11/18/2010 01:39:15 PM

## 2010-11-19 ENCOUNTER — Encounter (HOSPITAL_COMMUNITY): Payer: BC Managed Care – PPO | Admitting: Psychiatry

## 2010-11-23 ENCOUNTER — Ambulatory Visit: Payer: BC Managed Care – PPO | Admitting: Endocrinology

## 2010-11-25 NOTE — Assessment & Plan Note (Signed)
NAMECHANTILLY, [name] NO.:  [id number]  MEDICAL RECORD NO.:  [id number]  LOCATION:  0501                          FACILITY:  BH  PHYSICIAN:  [name], MD     DATE OF BIRTH:  1971-01-23  DATE OF ADMISSION:  11/01/2010 DATE OF DISCHARGE:                      PSYCHIATRIC ADMISSION ASSESSMENT   DATE OF ASSESSMENT:  August 21st, 2012 at 1600.  IDENTIFYING INFORMATION:  This is a 40 year old Caucasian female.  This is a voluntary admission.  HISTORY OF THE PRESENT ILLNESS:  Tina [name] presents with an increase in suicidal thoughts over the past 3 days.  She reported to her family that she had been more and more depressed and was having very strong urges to cut herself.  Finally, yesterday, she did make 6 or so superficial scratches to her wrist with a small box cutter.  Family found her and brought her to the hospital.  She reports 1-2 weeks of increasing sadness, anhedonia, depressed mood, also having crying spells and getting quite anxious.  She has no history of substance abuse.  PAST PSYCHIATRIC HISTORY:  This is her 5th [facility] admission since 2008. She has a history of bipolar disorder with prominent episodes of depression.  She has a history of multiple medication trials including Prozac, Depakote, Lamictal, imipramine, Tegretol, Librium.  Most recently, she was discharged from our unit on August the 13th after being diagnosed with bipolar 1 disorder, mixed state and possible hypothyroidism.  She is followed as an outpatient at the [facility].  She has a history of previous suicide attempts many times, typically by cutting.  She has a history of previous admissions also at [facility] in [place].  SOCIAL HISTORY:  She lives in [place] with her husband and 3 children.  Currently unemployed.  She is a stay-at-home mother.  She denies any current or past substance abuse issues.  MEDICAL HISTORY: 1. Primary care physician is Dr. Derrek Gu. 2. Medical problems:  Rule out thyroid disorder.  PHYSICAL EXAM:  Done here in the unit and is noted in the record.  This is a fully alert Caucasian female in no acute distress.  Admitting vital signs are temperature 97.9, pulse 79, respirations 16, blood pressure 115/77.  Basic labs:  CBC is essentially normal with a hemoglobin of 11.6, platelets normal at 215,000.  Sodium 138, potassium 3.6, chloride 108, carbon dioxide 21, BUN 16, creatinine 0.83 and random glucose is 108.  Liver enzymes are normal.  Alcohol screen is negative.  UA, UPT and UDS are currently pending.  On her last stay here, she had a TSH on July the 20th at 0.015.  A repeat that was done on August the 7th was normal at 2.482.  Meanwhile, her free T4 was mildly decreased at 0.36 and T3 uptake at 31.4.  At that time, she reported she had taken Cytomel in the past to augment antidepressants, and she was started on 30 mg of Armour Thyroid which was titrated to 60 mg daily at discharge.  MENTAL STATUS EXAM:  Fully alert female, pleasant, cooperative, looks sad, tearful, hopeless, helpless.  Speech fairly articulate.  She is cooperative, asking for help with her depression.  She has  had fleeting suicidal thoughts.  Feels safe here on the unit with no intent to harm herself.  Thinking is nonpsychotic.  No evidence of hallucinations or internal distractions.  Axis I:  Bipolar disorder not otherwise specified, depressed. Axis II:  No diagnosis. Axis III:.  Rule out thyroid disorder not otherwise specified. Axis IV:  Deferred. Axis V:  Current is 40.  Past year not known.  PLAN:  Voluntarily admit her with a goal of alleviating her suicidal thoughts.  We are going to resume her previous medications, and we will get some more information from her family.     Tina [name], [name]   ______________________________ [name], MD    MAS/MEDQ  D:  11/02/2010  T:  11/02/2010  Job:   [id number]  Electronically Signed by Kari Baars [name] on 11/24/2010 04:46:53 PM Electronically Signed by [name] MD on 11/25/2010 08:32:34 AM

## 2010-12-23 ENCOUNTER — Encounter: Payer: Self-pay | Admitting: Endocrinology

## 2010-12-23 ENCOUNTER — Other Ambulatory Visit (INDEPENDENT_AMBULATORY_CARE_PROVIDER_SITE_OTHER): Payer: BC Managed Care – PPO

## 2010-12-23 ENCOUNTER — Ambulatory Visit (INDEPENDENT_AMBULATORY_CARE_PROVIDER_SITE_OTHER): Payer: BC Managed Care – PPO | Admitting: Endocrinology

## 2010-12-23 VITALS — BP 112/68 | HR 70 | Temp 99.1°F | Ht 60.0 in | Wt 147.0 lb

## 2010-12-23 DIAGNOSIS — R5381 Other malaise: Secondary | ICD-10-CM

## 2010-12-23 DIAGNOSIS — M791 Myalgia, unspecified site: Secondary | ICD-10-CM

## 2010-12-23 DIAGNOSIS — E569 Vitamin deficiency, unspecified: Secondary | ICD-10-CM

## 2010-12-23 DIAGNOSIS — E039 Hypothyroidism, unspecified: Secondary | ICD-10-CM

## 2010-12-23 DIAGNOSIS — F32A Depression, unspecified: Secondary | ICD-10-CM

## 2010-12-23 DIAGNOSIS — IMO0001 Reserved for inherently not codable concepts without codable children: Secondary | ICD-10-CM

## 2010-12-23 DIAGNOSIS — F329 Major depressive disorder, single episode, unspecified: Secondary | ICD-10-CM

## 2010-12-23 DIAGNOSIS — R5383 Other fatigue: Secondary | ICD-10-CM

## 2010-12-23 LAB — CBC WITH DIFFERENTIAL/PLATELET
Eosinophils Relative: 0.1 % (ref 0.0–5.0)
Lymphocytes Relative: 22.6 % (ref 12.0–46.0)
MCV: 93.7 fl (ref 78.0–100.0)
Monocytes Absolute: 0.3 10*3/uL (ref 0.1–1.0)
Monocytes Relative: 4.2 % (ref 3.0–12.0)
Neutrophils Relative %: 72.5 % (ref 43.0–77.0)
Platelets: 292 10*3/uL (ref 150.0–400.0)
WBC: 5.9 10*3/uL (ref 4.5–10.5)

## 2010-12-23 LAB — SEDIMENTATION RATE: Sed Rate: 31 mm/hr — ABNORMAL HIGH (ref 0–22)

## 2010-12-23 LAB — BASIC METABOLIC PANEL
Chloride: 111 mEq/L (ref 96–112)
Creatinine, Ser: 0.7 mg/dL (ref 0.4–1.2)
Sodium: 142 mEq/L (ref 135–145)

## 2010-12-23 LAB — TSH: TSH: 0.7 u[IU]/mL (ref 0.35–5.50)

## 2010-12-23 LAB — CK: Total CK: 53 U/L (ref 7–177)

## 2010-12-23 LAB — VITAMIN B12: Vitamin B-12: 576 pg/mL (ref 211–911)

## 2010-12-23 LAB — T3, FREE: T3, Free: 2.5 pg/mL (ref 2.3–4.2)

## 2010-12-23 NOTE — Progress Notes (Signed)
Subjective:    Patient ID: Tina [name], female    DOB: Jun 12, 1970, 40 y.o.   MRN: [id number]  HPI Pt was dx'ed with hypothyroidism approx 2 mos ago, on routine labs.  In retrospect, she has 2 mos of moderate hair loss throughout the head, and assoc fatigue.  She is 18 mos postpartum.  She is no longer breast-feeding. She was rx'ed synthroid.  She says sxs are slightly worse.  She was initally rx'ed with armour thyroid, but due to excessive diaphoresis, it was changed to synthroid.  She was stated on oral contraceptives due to freq but irreg menses. Past Medical History  Diagnosis Date  . History of chicken pox   . Depression   . Hypothyroidism   . Genital warts     Past Surgical History  Procedure Date  . Tonsillectomy   . Cesarean section 2003, 2007, 2011    History   Social History  . Marital Status: Married    Spouse Name: N/A    Number of Children: N/A  . Years of Education: 16   Occupational History  . HOMEMAKER    Social History Main Topics  . Smoking status: Current Everyday Smoker -- 0.5 packs/day    Types: Cigarettes  . Smokeless tobacco: Not on file  . Alcohol Use: No  . Drug Use: No  . Sexually Active: Not on file   Other Topics Concern  . Not on file   Social History Narrative   Regular exercise-yes    No current outpatient prescriptions on file prior to visit.    Allergies  Allergen Reactions  . Penicillins   . Sulfa Drugs Cross Reactors   . Vancomycin     Family History  Problem Relation Age of Onset  . Heart disease Other     Grandparent  hypothyroidism: mother  BP 112/68  Pulse 70  Temp(Src) 99.1 F (37.3 C) (Oral)  Ht 5' (1.524 m)  Wt 147 lb (66.679 kg)  BMI 28.71 kg/m2  SpO2 97%  LMP 11/13/2010     Review of Systems She reports myalgias, depression, doe, muscle cramps, blurry vision, easy bruising, dry skin, difficulty with concentration, weight gain, cold intolerance, and alternating constipation/diarrhea.  denies  numbness, rhinorrhea, and syncope.      Objective:   Physical Exam VS: see vs page GEN: no distress HEAD: head: no deformity.  Hair is normal to my exam. eyes: no periorbital swelling, no proptosis external nose and ears are normal mouth: no lesion seen NECK: supple, thyroid is not enlarged. CHEST WALL: no deformity LUNGS:  Clear to auscultation CV: reg rate and rhythm, no murmur ABD: abdomen is soft, nontender.  no hepatosplenomegaly.  not distended.  no hernia. MUSCULOSKELETAL: muscle bulk and strength are grossly normal.  no obvious joint swelling.  gait is normal and steady EXTEMITIES: no deformity.  no ulcer on the feet.  feet are of normal color and temp.  no edema PULSES: dorsalis pedis intact bilat.  no carotid bruit. NEURO:  cn 2-12 grossly intact.   readily moves all 4's.  sensation is intact to touch on the feet SKIN:  Normal texture and temperature.  No rash or suspicious lesion is visible.   NODES:  None palpable at the neck. PSYCH: alert, oriented x3.  Does not appear anxious nor depressed.    Lab Results  Component Value Date   TSH 0.70 12/23/2010   (i reviewed t4 and tsh values over the past 2 mos) Assessment & Plan:  Primary  hypothyroidism, well-replaced H/o simultaneous low t4 and tsh, prob due to armour thyroid rx.  She should avoid this.   Excessive diaphoresis, possibly due to oversupplementation with armour thyroid.

## 2010-12-23 NOTE — Patient Instructions (Addendum)
blood tests are being requested for you today.  please call [phone] to hear your test results.  You will be prompted to enter the 9-digit "MRN" number that appears at the top left of this page, followed by #.  Then you will hear the message. Cc dr Algie Coffer (update: i left message on phone-tree:  Same rx.  Recheck tft in 10 month).Marland Kitchen

## 2010-12-24 LAB — VITAMIN D 25 HYDROXY (VIT D DEFICIENCY, FRACTURES): Vit D, 25-Hydroxy: 36 ng/mL (ref 30–89)

## 2011-07-24 ENCOUNTER — Emergency Department (HOSPITAL_COMMUNITY)
Admission: EM | Admit: 2011-07-24 | Discharge: 2011-07-24 | Disposition: A | Discharge status: Home / Self Care | Payer: BC Managed Care – PPO | Attending: Emergency Medicine | Admitting: Emergency Medicine

## 2011-07-24 ENCOUNTER — Encounter (HOSPITAL_COMMUNITY): Payer: Self-pay

## 2011-07-24 ENCOUNTER — Emergency Department (HOSPITAL_COMMUNITY): Payer: BC Managed Care – PPO

## 2011-07-24 DIAGNOSIS — E039 Hypothyroidism, unspecified: Secondary | ICD-10-CM | POA: Insufficient documentation

## 2011-07-24 DIAGNOSIS — F17200 Nicotine dependence, unspecified, uncomplicated: Secondary | ICD-10-CM | POA: Insufficient documentation

## 2011-07-24 DIAGNOSIS — R259 Unspecified abnormal involuntary movements: Secondary | ICD-10-CM | POA: Insufficient documentation

## 2011-07-24 DIAGNOSIS — R209 Unspecified disturbances of skin sensation: Secondary | ICD-10-CM | POA: Insufficient documentation

## 2011-07-24 DIAGNOSIS — G43909 Migraine, unspecified, not intractable, without status migrainosus: Secondary | ICD-10-CM | POA: Insufficient documentation

## 2011-07-24 DIAGNOSIS — R5381 Other malaise: Secondary | ICD-10-CM | POA: Insufficient documentation

## 2011-07-24 DIAGNOSIS — R251 Tremor, unspecified: Secondary | ICD-10-CM

## 2011-07-24 LAB — URINALYSIS, ROUTINE W REFLEX MICROSCOPIC
Glucose, UA: NEGATIVE mg/dL
Hgb urine dipstick: NEGATIVE
Leukocytes, UA: NEGATIVE
pH: 7 (ref 5.0–8.0)

## 2011-07-24 LAB — COMPREHENSIVE METABOLIC PANEL
AST: 33 U/L (ref 0–37)
Albumin: 3.7 g/dL (ref 3.5–5.2)
Alkaline Phosphatase: 77 U/L (ref 39–117)
BUN: 12 mg/dL (ref 6–23)
Creatinine, Ser: 0.54 mg/dL (ref 0.50–1.10)
Potassium: 4.6 mEq/L (ref 3.5–5.1)
Total Protein: 7.2 g/dL (ref 6.0–8.3)

## 2011-07-24 LAB — CBC
Hemoglobin: 14.3 g/dL (ref 12.0–15.0)
MCH: 32 pg (ref 26.0–34.0)
MCHC: 36.9 g/dL — ABNORMAL HIGH (ref 30.0–36.0)
RDW: 11.8 % (ref 11.5–15.5)

## 2011-07-24 LAB — DIFFERENTIAL
Basophils Absolute: 0 10*3/uL (ref 0.0–0.1)
Basophils Relative: 0 % (ref 0–1)
Eosinophils Absolute: 0 10*3/uL (ref 0.0–0.7)
Monocytes Relative: 8 % (ref 3–12)
Neutro Abs: 5.6 10*3/uL (ref 1.7–7.7)
Neutrophils Relative %: 68 % (ref 43–77)

## 2011-07-24 MED ORDER — METOCLOPRAMIDE HCL 5 MG/ML IJ SOLN
10.0000 mg | Freq: Once | INTRAMUSCULAR | Status: AC
  Administered 2011-07-24: 10 mg via INTRAVENOUS
  Filled 2011-07-24: qty 2

## 2011-07-24 MED ORDER — DIPHENHYDRAMINE HCL 50 MG/ML IJ SOLN
25.0000 mg | Freq: Once | INTRAMUSCULAR | Status: AC
  Administered 2011-07-24: 50 mg via INTRAVENOUS
  Filled 2011-07-24: qty 1

## 2011-07-24 MED ORDER — DEXAMETHASONE SODIUM PHOSPHATE 10 MG/ML IJ SOLN
10.0000 mg | Freq: Once | INTRAMUSCULAR | Status: DC
  Filled 2011-07-24: qty 1

## 2011-07-24 MED ORDER — SODIUM CHLORIDE 0.9 % IV BOLUS (SEPSIS)
1000.0000 mL | Freq: Once | INTRAVENOUS | Status: AC
  Administered 2011-07-24: 1000 mL via INTRAVENOUS

## 2011-07-24 MED ORDER — LORAZEPAM 1 MG PO TABS: 1.0000 mg | ORAL_TABLET | Freq: Three times a day (TID) | ORAL | Status: AC | PRN

## 2011-07-24 MED ORDER — LORAZEPAM 2 MG/ML IJ SOLN
1.0000 mg | Freq: Once | INTRAMUSCULAR | Status: AC
  Administered 2011-07-24: 1 mg via INTRAVENOUS
  Filled 2011-07-24: qty 1

## 2011-07-24 NOTE — Discharge Instructions (Signed)
Tremor Tremor is a rhythmic, involuntary muscular contraction characterized by oscillations (to-and-fro movements) of a part of the body. The most common of all involuntary movements, tremor can affect various body parts such as the hands, head, facial structures, vocal cords, trunk, and legs; most tremors, however, occur in the hands. Tremor often accompanies neurological disorders associated with aging. Although the disorder is not life-threatening, it can be responsible for functional disability and social embarrassment. TREATMENT  There are many types of tremor and several ways in which tremor is classified. The most common classification is by behavioral context or position. There are five categories of tremor within this classification: resting, postural, kinetic, task-specific, and psychogenic. Resting or static tremor occurs when the muscle is at rest, for example when the hands are lying on the lap. This type of tremor is often seen in patients with Parkinson's disease. Postural tremor occurs when a patient attempts to maintain posture, such as holding the hands outstretched. Postural tremors include physiological tremor, essential tremor, tremor with basal ganglia disease (also seen in patients with Parkinson's disease), cerebellar postural tremor, tremor with peripheral neuropathy, post-traumatic tremor, and alcoholic tremor. Kinetic or intention (action) tremor occurs during purposeful movement, for example during finger-to-nose testing. Task-specific tremor appears when performing goal-oriented tasks such as handwriting, speaking, or standing. This group consists of primary writing tremor, vocal tremor, and orthostatic tremor. Psychogenic tremor occurs in both older and younger patients. The key feature of this tremor is that it dramatically lessens or disappears when the patient is distracted. PROGNOSIS There are some treatment options available for tremor; the appropriate treatment depends on  accurate diagnosis of the cause. Some tremors respond to treatment of the underlying condition, for example in some cases of psychogenic tremor treating the patient's underlying mental problem may cause the tremor to disappear. Also, patients with tremor due to Parkinson's disease may be treated with Levodopa drug therapy. Symptomatic drug therapy is available for several other tremors as well. For those cases of tremor in which there is no effective drug treatment, physical measures such as teaching the patient to brace the affected limb during the tremor are sometimes useful. Surgical intervention such as thalamotomy or deep brain stimulation may be useful in certain cases. Document Released: 02/18/2002 Document Revised: 02/17/2011 Document Reviewed: 02/28/2005 [employer] Patient Information 2012 [employer]. 

## 2011-07-24 NOTE — ED Provider Notes (Signed)
History     CSN: [id number]  Arrival date & time 07/24/11  1231   First MD Initiated Contact with Patient 07/24/11 1319      Chief Complaint  Patient presents with  . Tremors    (Consider location/radiation/quality/duration/timing/severity/associated sxs/prior treatment) HPI Comments: 41 yo female who presents with a migraine-type headache, generalized myalgias, and tremors. She states the tremors are relatively diffuse but worse in her lower extremities. They are also present in her upper terminate. She said similar episodes in the past which resolved him around. She was diagnosed with Lyme disease and 02/2011 has been receiving treatment in [place]. Her infectious disease physician in [place] he has ordered MRI to be performed as an outpatient. She has completed a course of azithromycin, doxycycline, Flagyl. She has no other complaints.  The history is provided by the patient. No language interpreter was used.    Past Medical History  Diagnosis Date  . History of chicken pox   . Hypothyroidism   . Genital warts   . Depression     Past Surgical History  Procedure Date  . Tonsillectomy   . Cesarean section 2003, 2007, 2011    Family History  Problem Relation Age of Onset  . Heart disease Other     Grandparent    History  Substance Use Topics  . Smoking status: Current Everyday Smoker -- 0.5 packs/day    Types: Cigarettes  . Smokeless tobacco: Not on file  . Alcohol Use: No    OB History    Grav Para Term Preterm Abortions TAB SAB Ect Mult Living                  Review of Systems  Constitutional: Positive for fatigue. Negative for fever, chills, activity change and appetite change.  HENT: Negative for congestion, sore throat, rhinorrhea, neck pain and neck stiffness.   Respiratory: Negative for cough, chest tightness and shortness of breath.   Cardiovascular: Negative for chest pain and palpitations.  Gastrointestinal: Negative for nausea,  vomiting, abdominal pain, diarrhea and constipation.  Genitourinary: Negative for dysuria, urgency, frequency and flank pain.  Musculoskeletal: Negative for myalgias, back pain and arthralgias.  Neurological: Positive for tremors and numbness. Negative for dizziness, weakness, light-headedness and headaches.  All other systems reviewed and are negative.    Allergies  Penicillins; Vancomycin; and Sulfa drugs cross reactors  Home Medications   Current Outpatient Rx  Name Route Sig Dispense Refill  . VITAMIN C PO Oral Take 1 capsule by mouth daily.    Marland Kitchen FLUOXETINE HCL 20 MG PO CAPS Oral Take 20 mg by mouth daily.    Marland Kitchen HYDROCORTISONE 5 MG PO TABS Oral Take 5 mg by mouth 2 (two) times daily.    Marland Kitchen LAMOTRIGINE 200 MG PO TABS Oral Take 200 mg by mouth at bedtime.    . MULTI-VITAMIN/MINERALS PO TABS Oral Take 1 tablet by mouth daily.    Marland Kitchen PRESCRIPTION MEDICATION Oral Take 1 tablet by mouth daily. Low dose nalteixone    . PRESCRIPTION MEDICATION Oral Take 1 tablet by mouth daily. Synthroid Compounded T4 150 mg and  T3 75 mg    . PRESCRIPTION MEDICATION Topical Apply 1 application topically daily. Testosterone cream compounded in pharmacy    . PRESCRIPTION MEDICATION Topical Apply 1 application topically daily. Progesterone cream compounded in pharmacy. Takes on cycle days 14-28    . PRESCRIPTION MEDICATION Injection Inject 1 application as directed 3 (three) times a week. Vit B-12 injection    .  LEVOTHYROXINE SODIUM 50 MCG PO TABS Oral Take 50 mcg by mouth daily.      Marland Kitchen LORAZEPAM 1 MG PO TABS Oral Take 1 tablet (1 mg total) by mouth 3 (three) times daily as needed for anxiety. 10 tablet 0    BP 116/75  Pulse 98  Temp(Src) 98 F (36.7 C) (Oral)  Resp 20  SpO2 96%  LMP 07/10/2011  Physical Exam  Nursing note and vitals reviewed. Constitutional: She is oriented to person, place, and time. She appears well-developed and well-nourished. No distress.  HENT:  Head: Normocephalic and  atraumatic.  Mouth/Throat: Oropharynx is clear and moist.  Eyes: Conjunctivae and EOM are normal. Pupils are equal, round, and reactive to light.  Neck: Normal range of motion. Neck supple.  Cardiovascular: Normal rate, regular rhythm, normal heart sounds and intact distal pulses.  Exam reveals no gallop and no friction rub.   No murmur heard. Pulmonary/Chest: Effort normal and breath sounds normal. No respiratory distress. She exhibits no tenderness.  Abdominal: Soft. Bowel sounds are normal. She exhibits no distension. There is no tenderness.  Musculoskeletal: Normal range of motion. She exhibits no edema and no tenderness.  Neurological: She is alert and oriented to person, place, and time. No cranial nerve deficit.       In the lower extremities bilaterally. She also has slightly increased reflexes on the patella bilaterally. No clonus. On later evaluation the tremor resolves in her lower extremities and moved to her right upper extremity  Skin: Skin is warm and dry. No rash noted.    ED Course  Procedures (including critical care time)  Labs Reviewed  CBC - Abnormal; Notable for the following:    MCHC 36.9 (*)    All other components within normal limits  COMPREHENSIVE METABOLIC PANEL - Abnormal; Notable for the following:    Glucose, Bld 108 (*)    All other components within normal limits  DIFFERENTIAL  URINALYSIS, ROUTINE W REFLEX MICROSCOPIC   Ct Head Wo Contrast  07/24/2011  *RADIOLOGY REPORT*  Clinical Data: Trauma, being treated for  Lyme disease  CT HEAD WITHOUT CONTRAST  Technique:  Contiguous axial images were obtained from the base of the skull through the vertex without contrast.  Comparison: Brain MRI 11/04/2010  Findings: No skull fracture is noted.  Paranasal sinuses and mastoid air cells are unremarkable.  No intracranial hemorrhage, mass effect or midline shift.  The gray and white matter differentiation is preserved.  No acute infarction.  No mass lesion is noted on  this unenhanced scan.  IMPRESSION: No acute intracranial abnormality.  Original Report Authenticated By: Natasha Mead, M.D.     1. Migraine   2. Tremor       MDM  Tremor which resolved with a migraine cocktail and Ativan. Could be due to complex migraine however this could be a component of her Lyme disease. She has an MRI scheduled as an outpatient. There is no indication to continue workup. Emergency department as her symptoms have resolved. There could be a psychogenic component as well. Instructed to followup with her primary care physician. Provided strict return precautions        Dayton Bailiff, MD 07/24/11 1455

## 2011-07-24 NOTE — ED Notes (Addendum)
Pt. Has Lyme disease and started developing tremors around 10:30 this morning, also is having a  Migraine and generalized body aches.   Pt. Is rocking back and forth in the triage chair.

## 2012-08-23 ENCOUNTER — Emergency Department (HOSPITAL_COMMUNITY)
Admission: EM | Admit: 2012-08-23 | Discharge: 2012-08-25 | Disposition: A | Discharge status: Other Acute Inpatient Hospital | Payer: BC Managed Care – PPO | Attending: Emergency Medicine | Admitting: Emergency Medicine

## 2012-08-23 ENCOUNTER — Encounter (HOSPITAL_COMMUNITY): Payer: Self-pay

## 2012-08-23 DIAGNOSIS — F319 Bipolar disorder, unspecified: Secondary | ICD-10-CM | POA: Insufficient documentation

## 2012-08-23 DIAGNOSIS — F17200 Nicotine dependence, unspecified, uncomplicated: Secondary | ICD-10-CM | POA: Insufficient documentation

## 2012-08-23 DIAGNOSIS — F411 Generalized anxiety disorder: Secondary | ICD-10-CM

## 2012-08-23 DIAGNOSIS — F22 Delusional disorders: Secondary | ICD-10-CM

## 2012-08-23 DIAGNOSIS — Z8639 Personal history of other endocrine, nutritional and metabolic disease: Secondary | ICD-10-CM | POA: Insufficient documentation

## 2012-08-23 DIAGNOSIS — Z880 Allergy status to penicillin: Secondary | ICD-10-CM | POA: Insufficient documentation

## 2012-08-23 DIAGNOSIS — Z3202 Encounter for pregnancy test, result negative: Secondary | ICD-10-CM | POA: Insufficient documentation

## 2012-08-23 DIAGNOSIS — Z862 Personal history of diseases of the blood and blood-forming organs and certain disorders involving the immune mechanism: Secondary | ICD-10-CM | POA: Insufficient documentation

## 2012-08-23 DIAGNOSIS — Z8619 Personal history of other infectious and parasitic diseases: Secondary | ICD-10-CM | POA: Insufficient documentation

## 2012-08-23 DIAGNOSIS — R443 Hallucinations, unspecified: Secondary | ICD-10-CM

## 2012-08-23 DIAGNOSIS — F39 Unspecified mood [affective] disorder: Secondary | ICD-10-CM

## 2012-08-23 HISTORY — DX: Bipolar II disorder: F31.81

## 2012-08-23 LAB — COMPREHENSIVE METABOLIC PANEL
ALT: 15 U/L (ref 0–35)
Alkaline Phosphatase: 71 U/L (ref 39–117)
CO2: 24 mEq/L (ref 19–32)
Chloride: 97 mEq/L (ref 96–112)
GFR calc Af Amer: >90 mL/min (ref >90)
GFR calc non Af Amer: >90 mL/min (ref >90)
Glucose, Bld: 91 mg/dL (ref 70–99)
Potassium: 3.6 mEq/L (ref 3.5–5.1)
Sodium: 135 mEq/L (ref 135–145)
Total Bilirubin: 0.6 mg/dL (ref 0.3–1.2)
Total Protein: 7.9 g/dL (ref 6.0–8.3)

## 2012-08-23 LAB — RAPID URINE DRUG SCREEN, HOSP PERFORMED
Barbiturates: NOT DETECTED
Cocaine: NOT DETECTED
Tetrahydrocannabinol: POSITIVE — AB

## 2012-08-23 LAB — CBC
Hemoglobin: 15.8 g/dL — ABNORMAL HIGH (ref 12.0–15.0)
RBC: 4.81 MIL/uL (ref 3.87–5.11)

## 2012-08-23 LAB — ACETAMINOPHEN LEVEL: Acetaminophen (Tylenol), Serum: <15 ug/mL (ref 10–30)

## 2012-08-23 MED ORDER — ACETAMINOPHEN 325 MG PO TABS: 650.0000 mg | ORAL_TABLET | ORAL | Status: DC | PRN

## 2012-08-23 MED ORDER — IBUPROFEN 600 MG PO TABS: 600.0000 mg | ORAL_TABLET | Freq: Three times a day (TID) | ORAL | Status: DC | PRN

## 2012-08-23 MED ORDER — ZOLPIDEM TARTRATE 5 MG PO TABS: 5.0000 mg | ORAL_TABLET | Freq: Every evening | ORAL | Status: DC | PRN

## 2012-08-23 MED ORDER — ONDANSETRON HCL 4 MG PO TABS: 4.0000 mg | ORAL_TABLET | Freq: Three times a day (TID) | ORAL | Status: DC | PRN

## 2012-08-23 MED ORDER — NICOTINE 21 MG/24HR TD PT24
21.0000 mg | MEDICATED_PATCH | Freq: Every day | TRANSDERMAL | Status: DC
Start: 2012-08-23 — End: 2012-08-25
  Filled 2012-08-23: qty 1

## 2012-08-23 MED ORDER — LORAZEPAM 1 MG PO TABS
1.0000 mg | ORAL_TABLET | Freq: Three times a day (TID) | ORAL | Status: DC | PRN
  Filled 2012-08-23: qty 1

## 2012-08-23 MED ORDER — ALUM & MAG HYDROXIDE-SIMETH 200-200-20 MG/5ML PO SUSP: 30.0000 mL | ORAL | Status: DC | PRN

## 2012-08-23 NOTE — ED Notes (Signed)
Pt. Unable urinate at this time. Nurse was notified.
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
## 2012-08-23 NOTE — Consult Note (Signed)
Reason for Consult: Evaluation for inpatient treatment Referring Physician: EDP  Tina [name] is an 42 y.o. female.  HPI: Patient brought in IVC that was taken out by patients husband.  Patient states that she is here because of "blood relatives think I'm crazy.  They are the ones that are crazy.  I was having thoughts and I just had to call my dad out on it and when I don't want to go along with them; I know 100% of what's going on; then they say I crazy."  Patient states that she was receiving outpatient services with Dr. Dub Mikes until he left to go to [facility].  Now she is not seeing anyone and has not taking her medications for 3-4 months. Patient states "I don't need medications; why would I take them; have you read the S.E on the pamphlets.  When asked about stopping her medications patient state "I been on meds since I was 42 yr old and I know how to stop taking, taper; I am not stupid."  Patient states that she is a stay at home mother and has 3 children who are with her husband (separated).  States that the police busted up in her back yard and handcuffed her for no reason; but she trusted them and that is the only reason she is her.  Past Medical History  Diagnosis Date  . History of chicken pox   . Hypothyroidism   . Genital warts   . Depression   . Bipolar 2 disorder     Past Surgical History  Procedure Laterality Date  . Tonsillectomy    . Cesarean section  2003, 2007, 2011    Family History  Problem Relation Age of Onset  . Heart disease Other     Grandparent    Social History:  reports that she has been smoking Cigarettes.  She has been smoking about 0.50 packs per day. She does not have any smokeless tobacco history on file. She reports that she does not drink alcohol or use illicit drugs.  Allergies:  Allergies  Allergen Reactions  . Penicillins Hives  . Vancomycin Itching  . Sulfa Drugs Cross Reactors Rash    Medications: I have reviewed the patient's current  medications.  Results for orders placed during the hospital encounter of 08/23/12 (from the past 48 hour(s))  ACETAMINOPHEN LEVEL     Status: None   Collection Time    08/23/12 11:28 AM      Result Value Range   Acetaminophen (Tylenol), Serum <15.0  10 - 30 ug/mL   Comment:            THERAPEUTIC CONCENTRATIONS VARY     SIGNIFICANTLY. A RANGE OF 10-30     ug/mL MAY BE AN EFFECTIVE     CONCENTRATION FOR MANY PATIENTS.     HOWEVER, SOME ARE BEST TREATED     AT CONCENTRATIONS OUTSIDE THIS     RANGE.     ACETAMINOPHEN CONCENTRATIONS     >150 ug/mL AT 4 HOURS AFTER     INGESTION AND >50 ug/mL AT 12     HOURS AFTER INGESTION ARE     OFTEN ASSOCIATED WITH TOXIC     REACTIONS.  CBC     Status: Abnormal   Collection Time    08/23/12 11:28 AM      Result Value Range   WBC 10.1  4.0 - 10.5 K/uL   RBC 4.81  3.87 - 5.11 MIL/uL   Hemoglobin 15.8 (*) 12.0 -  15.0 g/dL   HCT 40.9  81.1 - 91.4 %   MCV 92.9  78.0 - 100.0 fL   MCH 32.8  26.0 - 34.0 pg   MCHC 35.3  30.0 - 36.0 g/dL   RDW 78.2  95.6 - 21.3 %   Platelets 266  150 - 400 K/uL  COMPREHENSIVE METABOLIC PANEL     Status: None   Collection Time    08/23/12 11:28 AM      Result Value Range   Sodium 135  135 - 145 mEq/L   Potassium 3.6  3.5 - 5.1 mEq/L   Chloride 97  96 - 112 mEq/L   CO2 24  19 - 32 mEq/L   Glucose, Bld 91  70 - 99 mg/dL   BUN 7  6 - 23 mg/dL   Creatinine, Ser 0.86  0.50 - 1.10 mg/dL   Calcium 9.9  8.4 - 57.8 mg/dL   Total Protein 7.9  6.0 - 8.3 g/dL   Albumin 4.3  3.5 - 5.2 g/dL   AST 22  0 - 37 U/L   ALT 15  0 - 35 U/L   Alkaline Phosphatase 71  39 - 117 U/L   Total Bilirubin 0.6  0.3 - 1.2 mg/dL   GFR calc non Af Amer >90  >90 mL/min   GFR calc Af Amer >90  >90 mL/min   Comment:            The eGFR has been calculated     using the CKD EPI equation.     This calculation has not been     validated in all clinical     situations.     eGFR's persistently     <90 mL/min signify     possible Chronic  Kidney Disease.  ETHANOL     Status: None   Collection Time    08/23/12 11:28 AM      Result Value Range   Alcohol, Ethyl (B) <11  0 - 11 mg/dL   Comment:            LOWEST DETECTABLE LIMIT FOR     SERUM ALCOHOL IS 11 mg/dL     FOR MEDICAL PURPOSES ONLY  SALICYLATE LEVEL     Status: Abnormal   Collection Time    08/23/12 11:28 AM      Result Value Range   Salicylate Lvl <2.0 (*) 2.8 - 20.0 mg/dL  URINE RAPID DRUG SCREEN (HOSP PERFORMED)     Status: Abnormal   Collection Time    08/23/12 11:51 AM      Result Value Range   Opiates NONE DETECTED  NONE DETECTED   Cocaine NONE DETECTED  NONE DETECTED   Benzodiazepines NONE DETECTED  NONE DETECTED   Amphetamines NONE DETECTED  NONE DETECTED   Tetrahydrocannabinol POSITIVE (*) NONE DETECTED   Barbiturates NONE DETECTED  NONE DETECTED   Comment:            DRUG SCREEN FOR MEDICAL PURPOSES     ONLY.  IF CONFIRMATION IS NEEDED     FOR ANY PURPOSE, NOTIFY LAB     WITHIN 5 DAYS.                LOWEST DETECTABLE LIMITS     FOR URINE DRUG SCREEN     Drug Class       Cutoff (ng/mL)     Amphetamine      1000     Barbiturate  200     Benzodiazepine   200     Tricyclics       300     Opiates          300     Cocaine          300     THC              50  POCT PREGNANCY, URINE     Status: None   Collection Time    08/23/12 12:13 PM      Result Value Range   Preg Test, Ur NEGATIVE  NEGATIVE   Comment:            THE SENSITIVITY OF THIS     METHODOLOGY IS >24 mIU/mL    No results found.  Review of Systems  Psychiatric/Behavioral: Positive for substance abuse (denies). Depression: Denies. Suicidal ideas: Denies. Hallucinations: Denies. Memory loss: Denies. Nervous/anxious: Denies. Insomnia: Denies.        When asked about hallucinations patient responded "I don hear voices I am just very intuitive to pick up on others feelings even if they don know.  Paranoia patient responds "Yes, but legit paranoia; I know they are talking about  me." HI patient states "NO".  Sleep patient states "I don't have a problem with sleep.  I might stay up all night, few hour of sleep, or 12 hours of sleep.  No I am not bipolar and not manic either. I have lyme disease; yes I was treated but it is something that stays in your system forever you never get rid of it."  All other systems reviewed and are negative.   Blood pressure 118/96, pulse 83, temperature 97.4 F (36.3 C), temperature source Oral, resp. rate 18, SpO2 99.00%. Physical Exam  Constitutional: She is oriented to person, place, and time. She appears well-developed.  HENT:  Head: Normocephalic.  Eyes: Pupils are equal, round, and reactive to light.  Neck: Normal range of motion.  Respiratory: Effort normal.  Musculoskeletal: Normal range of motion.  Neurological: She is alert and oriented to person, place, and time.  Skin: Skin is warm and dry.  Psychiatric: Her mood appears anxious. Her affect is angry. Her speech is rapid and/or pressured. She is agitated and actively hallucinating. Thought content is paranoid. She expresses no homicidal and no suicidal ideation. She exhibits abnormal recent memory.    Assessment/Plan:  Face to face interview and consulted to with Dr. Lolly Mustache Recommendations:  Inpatient treatment  1. Admit for crisis management and stabilization.  2. Review and initiate  medications pertinent to patient illness and treatment.  3. Medication management to reduce current symptoms to base line and improve the         patient's overall level of functioning.  Accepted to [facility] after 7:30 PM   Rankin, Shuvon FNP-BC 08/23/2012, 1:11 PM     I agreed with the findings and involved in the treatment plan.

## 2012-08-23 NOTE — ED Notes (Signed)
Pt brought here by [facility] with IVC papers, pt sent here from [facility]; states pt has hx of Bipolar, SA x3, pt not taking her meds

## 2012-08-23 NOTE — ED Notes (Signed)
ACT team attempted to explain to pt. That she will be going to [facility] later, pt. Came out into the hallway and loudly yelling that she will not go there, yelling "if you want to see someone F___ing go off, I will if you send me back there.  NT attempted to escort pt. Back to room,  Pt. Redirected to room.

## 2012-08-23 NOTE — BH Assessment (Signed)
BHH Assessment Progress Note    Patient accepted to [facility] by Alvy Beal, NP to Dr. Jannifer Franklin. The room assignment is 407-1. Pt must not go over to [facility] until after 7:30pm. Support paperwork and patient's first opinion needs to be completed prior to transport to [facility]. Passed information to oncoming ACT staff-Ava.

## 2012-08-23 NOTE — ED Notes (Signed)
Pt. Making phone call to father.  Pt. Became very mad at father and yelled into phone "we are done, don't ever call me again" and hung up on father.

## 2012-08-23 NOTE — ED Provider Notes (Signed)
History     CSN: [id number]  Arrival date & time 08/23/12  1104   First MD Initiated Contact with Patient 08/23/12 1113      Chief Complaint  Patient presents with  . Medical Clearance    (Consider location/radiation/quality/duration/timing/severity/associated sxs/prior treatment) HPI A LEVEL 5 CAVEAT PERTAINS DUE TO POOR HISTORIAN/UNCOOPERATIVE Pt presenting with [place] under IVC papers that were taken out by her husband.  Pt not cooperative with history and states that she was handcuffed in her backyard and doesn't know why.  She will only tell me "it's all lies".  Per IVC papers pt has bipolar type II, is not taking any medications.  She also has been abusing substances and will not accept help.    Past Medical History  Diagnosis Date  . History of chicken pox   . Hypothyroidism   . Genital warts   . Depression   . Bipolar 2 disorder     Past Surgical History  Procedure Laterality Date  . Tonsillectomy    . Cesarean section  2003, 2007, 2011    Family History  Problem Relation Age of Onset  . Heart disease Other     Grandparent    History  Substance Use Topics  . Smoking status: Current Every Day Smoker -- 0.50 packs/day    Types: Cigarettes  . Smokeless tobacco: Not on file  . Alcohol Use: No    OB History   Grav Para Term Preterm Abortions TAB SAB Ect Mult Living                  Review of Systems UNABLE TO OBTAIN ROS DUE TO LEVEL 5 CAVEAT  Allergies  Penicillins; Vancomycin; and Sulfa drugs cross reactors  Home Medications   No current outpatient prescriptions on file.  BP 118/96  Pulse 83  Temp(Src) 97.4 F (36.3 C) (Oral)  Resp 18  SpO2 99% Vitals reviewed Physical Exam Physical Examination: General appearance - alert, well appearing, and in no distress Mental status - alert, oriented to person, place, and time Eyes - no conjunctival injection, no scleral icterus Mouth - mucous membranes moist, pharynx normal without lesions Chest  - clear to auscultation, no wheezes, rales or rhonchi, symmetric air entry Heart - normal rate, regular rhythm, normal S1, S2, no murmurs, rubs, clicks or gallops Abdomen - soft, nontender, nondistended, no masses or organomegaly Extremities - peripheral pulses normal, no pedal edema, no clubbing or cyanosis Skin - normal coloration and turgor, no rashes Psych- flat affect, calm  ED Course  Procedures (including critical care time)  12:16 PM d/w ACT team to evaluate patient, all labs obtained are reassuring- awaiting UDS but she is medically cleared now.   Labs Reviewed  CBC - Abnormal; Notable for the following:    Hemoglobin 15.8 (*)    All other components within normal limits  SALICYLATE LEVEL - Abnormal; Notable for the following:    Salicylate Lvl <2.0 (*)    All other components within normal limits  URINE RAPID DRUG SCREEN (HOSP PERFORMED) - Abnormal; Notable for the following:    Tetrahydrocannabinol POSITIVE (*)    All other components within normal limits  ACETAMINOPHEN LEVEL  COMPREHENSIVE METABOLIC PANEL  ETHANOL  POCT PREGNANCY, URINE   No results found.   No diagnosis found.    MDM  Pt presenting under IVC due to bipolar off meds, substance abuse. Pt is medically clear, will need to be evaluated by ACT team.  Ethelda Chick, MD 08/23/12 1416

## 2012-08-23 NOTE — ED Notes (Signed)
Report attempted to psych ed, states unable to take pt at this time, will call back

## 2012-08-24 MED ORDER — HALOPERIDOL LACTATE 5 MG/ML IJ SOLN
5.0000 mg | Freq: Four times a day (QID) | INTRAMUSCULAR | Status: DC | PRN
  Administered 2012-08-24: 5 mg via INTRAMUSCULAR
  Filled 2012-08-24: qty 1

## 2012-08-24 MED ORDER — LORAZEPAM 2 MG/ML IJ SOLN
INTRAMUSCULAR | Status: AC
  Filled 2012-08-24: qty 1

## 2012-08-24 MED ORDER — LORAZEPAM 1 MG PO TABS
1.0000 mg | ORAL_TABLET | Freq: Three times a day (TID) | ORAL | Status: DC | PRN
  Filled 2012-08-24: qty 1

## 2012-08-24 MED ORDER — LORAZEPAM 2 MG/ML IJ SOLN
1.0000 mg | Freq: Once | INTRAMUSCULAR | Status: AC
  Administered 2012-08-24: 1 mg via INTRAMUSCULAR

## 2012-08-24 NOTE — ED Notes (Signed)
Verbal order given by Donell Sievert, PA to order telepsych.

## 2012-08-24 NOTE — ED Notes (Signed)
Patient talking to writer patient has angry affect but laughing inappropriately. Patient has rapid pressured speech. Patient changes story frequently. Patient also admits having some confusion.

## 2012-08-24 NOTE — BHH Counselor (Signed)
This Clinical research associate talked with Tyrell Antonio, RN regarding transfer for this pt to [facility].  Per Rashell, pt became aggressive, pt did not CIRT while in the ED, however was adamant that she would fight if she was admitted to [facility].  Per Sonnie Alamo K(RN) and Donell Sievert, (PA), both agreed that pt should remain in the psych ed until morning and be re-evaluated again for admission to [facility] or perhaps seek other placement. Telepsych was completed to determine if pt could be discharged and follow up outpatient, telepsych recommends inpt admission due to pt.'s unpredictable, manic and erratic behavior.  Also per Rashell, 1st opinion has been completed and is in the chart.  Pt

## 2012-08-24 NOTE — ED Provider Notes (Signed)
Pt has been accepted to [facility] by Dr Jannifer Franklin.  Rolan Bucco, MD 08/24/12 [phone]

## 2012-08-24 NOTE — ED Notes (Signed)
Telepsych in progress. 

## 2012-08-24 NOTE — ED Notes (Signed)
Patient at nurse station at crying, yelling and acting belligerent at nurses station. Writer goes out to comfort patient. Writer lets patient know that she must control her outburst so that medication does not have to be forced on her. Patient gets up and walk with writer to room. Writer sits and listen to patient. Patient has disorganized thoughts.

## 2012-08-24 NOTE — ED Notes (Signed)
Patient at window wanted to know how she get out of here. Patient states that she does not want to go to behavior health. Writer informed patient that Donell Sievert would be over and she could speak with him about her admission. Patient verbalized understanding and no new request made.

## 2012-08-24 NOTE — ED Notes (Signed)
Donell Sievert, Pa Minerva Areola, RN, [facility] at patient bedside explaining about admission and patient IVC status. Patient heard by writer yelling and screaming at [facility].

## 2012-08-24 NOTE — Progress Notes (Signed)
Patient ID: Tina [name], female   DOB: 02-Dec-1970, 42 y.o.   MRN: [id number] Unable to complete consultation note at this time.  Seen early this am briefly but was not able to engage her in an assessment or interview.  Patient is still sleeping since morning after receiving Haldol and Ativan in am for severe agitation.  Patient was angry and aggressive towards staff when she realized she was not going to be discharged home.  She lacked insight and her judgement was poor. She was given injections of Haldol and Ativan and since then she has been sleeping .  Attempted x3 to wake her up for assessment but she opened her eyes and went back to sleep.   Consult and face to face interview was done with Dr Lolly Mustache. Plan:  We will continue to monitor for safety and consult will be attempted by oncoming staff member. Dahlia Byes  PMHNP-BCI personally seen the patient agreed with the findings and involved in the treatment plan.

## 2012-08-25 ENCOUNTER — Inpatient Hospital Stay (HOSPITAL_COMMUNITY)
Admission: AD | Admit: 2012-08-25 | Discharge: 2012-09-03 | DRG: 430 | Disposition: A | Discharge status: Home / Self Care | Payer: BC Managed Care – PPO | Source: Intra-hospital | Attending: Psychiatry | Admitting: Psychiatry

## 2012-08-25 ENCOUNTER — Encounter (HOSPITAL_COMMUNITY): Payer: Self-pay | Admitting: *Deleted

## 2012-08-25 DIAGNOSIS — Z79899 Other long term (current) drug therapy: Secondary | ICD-10-CM

## 2012-08-25 DIAGNOSIS — M791 Myalgia, unspecified site: Secondary | ICD-10-CM

## 2012-08-25 DIAGNOSIS — R5383 Other fatigue: Secondary | ICD-10-CM

## 2012-08-25 DIAGNOSIS — E039 Hypothyroidism, unspecified: Secondary | ICD-10-CM

## 2012-08-25 DIAGNOSIS — F329 Major depressive disorder, single episode, unspecified: Secondary | ICD-10-CM

## 2012-08-25 DIAGNOSIS — E569 Vitamin deficiency, unspecified: Secondary | ICD-10-CM

## 2012-08-25 DIAGNOSIS — F319 Bipolar disorder, unspecified: Secondary | ICD-10-CM

## 2012-08-25 DIAGNOSIS — F32A Depression, unspecified: Secondary | ICD-10-CM

## 2012-08-25 DIAGNOSIS — F3110 Bipolar disorder, current episode manic without psychotic features, unspecified: Principal | ICD-10-CM

## 2012-08-25 MED ORDER — MAGNESIUM HYDROXIDE 400 MG/5ML PO SUSP: 30.0000 mL | Freq: Every day | ORAL | Status: DC | PRN

## 2012-08-25 MED ORDER — MAGNESIUM HYDROXIDE 400 MG/5ML PO SUSP
30.0000 mL | Freq: Every day | ORAL | Status: DC | PRN
  Administered 2012-08-26 – 2012-08-31 (×3): 30 mL via ORAL

## 2012-08-25 MED ORDER — LORAZEPAM 2 MG/ML IJ SOLN
1.0000 mg | Freq: Four times a day (QID) | INTRAMUSCULAR | Status: DC | PRN
  Filled 2012-08-25: qty 1

## 2012-08-25 MED ORDER — ALUM & MAG HYDROXIDE-SIMETH 200-200-20 MG/5ML PO SUSP: 30.0000 mL | ORAL | Status: DC | PRN

## 2012-08-25 MED ORDER — ACETAMINOPHEN 325 MG PO TABS: 650.0000 mg | ORAL_TABLET | Freq: Four times a day (QID) | ORAL | Status: DC | PRN

## 2012-08-25 MED ORDER — LORAZEPAM 1 MG PO TABS: 1.0000 mg | ORAL_TABLET | Freq: Four times a day (QID) | ORAL | Status: DC | PRN

## 2012-08-25 MED ORDER — DIVALPROEX SODIUM 250 MG PO DR TAB
250.0000 mg | DELAYED_RELEASE_TABLET | Freq: Two times a day (BID) | ORAL | Status: DC
  Filled 2012-08-25 (×4): qty 1

## 2012-08-25 MED ORDER — CITALOPRAM HYDROBROMIDE 10 MG PO TABS
10.0000 mg | ORAL_TABLET | Freq: Every day | ORAL | Status: DC
  Administered 2012-08-26: 10 mg via ORAL
  Filled 2012-08-25 (×3): qty 1

## 2012-08-25 MED ORDER — LORAZEPAM 1 MG PO TABS: 1.0000 mg | ORAL_TABLET | Freq: Three times a day (TID) | ORAL | Status: DC | PRN

## 2012-08-25 NOTE — BH Assessment (Signed)
BHH Assessment Progress Note  Pt accepted to [facility] 405-1 Karleen Hampshire accepted to Dr. Jannifer Franklin.  Pt will go by [employer]. Pt can go after 1500.  Nurse in ED is going to call report around 1430.

## 2012-08-25 NOTE — Consult Note (Signed)
Reason for Consult: Depression and SI Referring Physician: Leeann Must  08/24/2012  Tina [name] is an 42 y.o. female.  HPI:  Patient was not able to participate in assessment yesterday due to agitation and aggressive behavior towards staff.  She was a danger to herself and staff and was medicated with Haldol and Ativan injections.  Brief contact with patient before she became agitated she stated that she was here because  "blood relatives think I'm crazy". "They are the ones that are crazy." Patient is the mother of 3 children and currently separated from her spouse. She is a stay at home mother stating she resting in her back yard, relaxing, drinking beer and [employer] approached her. She was told that she was under IVC and must come to [facility] for an assessment. Patient appeared  manic, speaking rapidly, loud at times, and also sporadically unable to remain on various topics yesterday. She was not able to control her emotions constantly coming to the nursing station window asking to be discharged home.  This writer witnessed patient seemingly calm, pleasant, and in a matter of minutes quickly switching to someone whom appeared angry. Patient seems to have extreme difficulty controlling her moods.  She told this Clinical research associate she has not been taking any psychiatric medications but uses Marijuana daily to calm herself down.  Today she reports sleeping very well last night and eating well.  She also reports she has been busy calling friends to come over and secure a discharge for her.  She seems calm today and denies SI/HI/AVH.  She does not believe in taking any medications  and did not give the names of any medications she is taking or has taken in the past.  We will admit to our inpatient unit for stabilization and safety.   Past Medical History  Diagnosis Date  . History of chicken pox   . Hypothyroidism   . Genital warts   . Depression   . Bipolar 2 disorder     Past Surgical History  Procedure Laterality Date   . Tonsillectomy    . Cesarean section  2003, 2007, 2011    Family History  Problem Relation Age of Onset  . Heart disease Other     Grandparent    Social History:  reports that she has been smoking Cigarettes.  She has been smoking about 0.50 packs per day. She does not have any smokeless tobacco history on file. She reports that she does not drink alcohol or use illicit drugs.  Allergies:  Allergies  Allergen Reactions  . Penicillins Hives  . Vancomycin Itching  . Sulfa Drugs Cross Reactors Rash    Medications: I have reviewed the patient's current medications.  Results for orders placed during the hospital encounter of 08/23/12 (from the past 48 hour(s))  ACETAMINOPHEN LEVEL     Status: None   Collection Time    08/23/12 11:28 AM      Result Value Range   Acetaminophen (Tylenol), Serum <15.0  10 - 30 ug/mL   Comment:            THERAPEUTIC CONCENTRATIONS VARY     SIGNIFICANTLY. A RANGE OF 10-30     ug/mL MAY BE AN EFFECTIVE     CONCENTRATION FOR MANY PATIENTS.     HOWEVER, SOME ARE BEST TREATED     AT CONCENTRATIONS OUTSIDE THIS     RANGE.     ACETAMINOPHEN CONCENTRATIONS     >150 ug/mL AT 4 HOURS AFTER  INGESTION AND >50 ug/mL AT 12     HOURS AFTER INGESTION ARE     OFTEN ASSOCIATED WITH TOXIC     REACTIONS.  CBC     Status: Abnormal   Collection Time    08/23/12 11:28 AM      Result Value Range   WBC 10.1  4.0 - 10.5 K/uL   RBC 4.81  3.87 - 5.11 MIL/uL   Hemoglobin 15.8 (*) 12.0 - 15.0 g/dL   HCT 16.1  09.6 - 04.5 %   MCV 92.9  78.0 - 100.0 fL   MCH 32.8  26.0 - 34.0 pg   MCHC 35.3  30.0 - 36.0 g/dL   RDW 40.9  81.1 - 91.4 %   Platelets 266  150 - 400 K/uL  COMPREHENSIVE METABOLIC PANEL     Status: None   Collection Time    08/23/12 11:28 AM      Result Value Range   Sodium 135  135 - 145 mEq/L   Potassium 3.6  3.5 - 5.1 mEq/L   Chloride 97  96 - 112 mEq/L   CO2 24  19 - 32 mEq/L   Glucose, Bld 91  70 - 99 mg/dL   BUN 7  6 - 23 mg/dL    Creatinine, Ser 7.82  0.50 - 1.10 mg/dL   Calcium 9.9  8.4 - 95.6 mg/dL   Total Protein 7.9  6.0 - 8.3 g/dL   Albumin 4.3  3.5 - 5.2 g/dL   AST 22  0 - 37 U/L   ALT 15  0 - 35 U/L   Alkaline Phosphatase 71  39 - 117 U/L   Total Bilirubin 0.6  0.3 - 1.2 mg/dL   GFR calc non Af Amer >90  >90 mL/min   GFR calc Af Amer >90  >90 mL/min   Comment:            The eGFR has been calculated     using the CKD EPI equation.     This calculation has not been     validated in all clinical     situations.     eGFR's persistently     <90 mL/min signify     possible Chronic Kidney Disease.  ETHANOL     Status: None   Collection Time    08/23/12 11:28 AM      Result Value Range   Alcohol, Ethyl (B) <11  0 - 11 mg/dL   Comment:            LOWEST DETECTABLE LIMIT FOR     SERUM ALCOHOL IS 11 mg/dL     FOR MEDICAL PURPOSES ONLY  SALICYLATE LEVEL     Status: Abnormal   Collection Time    08/23/12 11:28 AM      Result Value Range   Salicylate Lvl <2.0 (*) 2.8 - 20.0 mg/dL  URINE RAPID DRUG SCREEN (HOSP PERFORMED)     Status: Abnormal   Collection Time    08/23/12 11:51 AM      Result Value Range   Opiates NONE DETECTED  NONE DETECTED   Cocaine NONE DETECTED  NONE DETECTED   Benzodiazepines NONE DETECTED  NONE DETECTED   Amphetamines NONE DETECTED  NONE DETECTED   Tetrahydrocannabinol POSITIVE (*) NONE DETECTED   Barbiturates NONE DETECTED  NONE DETECTED   Comment:            DRUG SCREEN FOR MEDICAL PURPOSES     ONLY.  IF CONFIRMATION  IS NEEDED     FOR ANY PURPOSE, NOTIFY LAB     WITHIN 5 DAYS.                LOWEST DETECTABLE LIMITS     FOR URINE DRUG SCREEN     Drug Class       Cutoff (ng/mL)     Amphetamine      1000     Barbiturate      200     Benzodiazepine   200     Tricyclics       300     Opiates          300     Cocaine          300     THC              50  POCT PREGNANCY, URINE     Status: None   Collection Time    08/23/12 12:13 PM      Result Value Range   Preg  Test, Ur NEGATIVE  NEGATIVE   Comment:            THE SENSITIVITY OF THIS     METHODOLOGY IS >24 mIU/mL    No results found.  Review of Systems  Constitutional: Negative.  Negative for fever, chills, weight loss, malaise/fatigue and diaphoresis.  HENT: Negative for hearing loss, ear pain, nosebleeds, congestion, sore throat, neck pain, tinnitus and ear discharge.   Eyes: Negative.  Negative for blurred vision, double vision, photophobia, pain, discharge and redness.  Respiratory: Negative.  Negative for cough, hemoptysis, sputum production, shortness of breath, wheezing and stridor.   Cardiovascular: Negative.  Negative for chest pain, palpitations, orthopnea, claudication, leg swelling and PND.  Gastrointestinal: Negative.  Negative for heartburn, nausea, vomiting, abdominal pain, diarrhea, blood in stool and melena.  Genitourinary: Negative.  Negative for dysuria, urgency, frequency, hematuria and flank pain.  Musculoskeletal: Negative.  Negative for myalgias, back pain, joint pain and falls.  Skin: Negative.  Negative for itching and rash.  Neurological: Positive for headaches. Negative for dizziness, tingling, tremors, sensory change, speech change, focal weakness, seizures, loss of consciousness and weakness.  Endo/Heme/Allergies: Negative.  Negative for environmental allergies and polydipsia.  Psychiatric/Behavioral: Negative for depression, suicidal ideas, hallucinations, memory loss and substance abuse. The patient is nervous/anxious (Rates anxiety 8/10.. Want to go home). The patient does not have insomnia.    Blood pressure 96/62, pulse 57, temperature 98.3 F (36.8 C), temperature source Oral, resp. rate 18, SpO2 96.00%. Physical Exam  Constitutional: She is oriented to person, place, and time. She appears well-developed and well-nourished. No distress.  HENT:  Head: Normocephalic and atraumatic.  Mouth/Throat: No oropharyngeal exudate.  Eyes: Conjunctivae and EOM are normal.   Neck: Normal range of motion. Neck supple. No JVD present. No tracheal deviation present. No thyromegaly present.  Cardiovascular: Normal rate and regular rhythm.   Respiratory: Effort normal and breath sounds normal. No respiratory distress. She has no wheezes. She has no rales. She exhibits no tenderness.  GI: Soft. Bowel sounds are normal. She exhibits no distension and no mass. There is no tenderness. There is no rebound and no guarding.  Musculoskeletal: Normal range of motion. She exhibits no edema and no tenderness.  Lymphadenopathy:    She has no cervical adenopathy.  Neurological: She is alert and oriented to person, place, and time. She has normal reflexes. She displays normal reflexes. No cranial nerve deficit. She exhibits normal muscle  tone. Coordination normal.  Skin: Skin is dry. No rash noted. She is not diaphoretic. No erythema. No pallor.    Assessment/Plan:  Consult with Dr Lolly Mustache yesterday and face to face  Recommendation:  Admit for safety and stability when bed becomes available.  Dahlia Byes, C  PMHNP-BC 08/25/2012, 10:30 AM

## 2012-08-25 NOTE — ED Notes (Signed)
Report called to [facility] Ms RN-charge nurse-will send to [facility] at 3pm via [employer] as she is IVC.

## 2012-08-25 NOTE — Progress Notes (Signed)
Patient ID: Tina [name], female   DOB: [birth year], 42 y.o.   MRN: [id number] 08-25-12 @ 1631 nursing admission note; pt came [facility] from [facility] involuntarily. She was brought in [employer]. She was crying and very emotional on adm. This admission is incomplete. She denied any si/hi/av. She is a smoker at 1ppd and declined the nic patch. She had no pain, is allergic to vancomycin, has never had a seizure, is not a fall risk and does not have diabetes. Pt was escorted to the 400 hall. Report was given to [facility].

## 2012-08-25 NOTE — ED Notes (Signed)
[employer] notified of transportation need to [facility].

## 2012-08-25 NOTE — Progress Notes (Signed)
Patient ID: Tina [name], female   DOB: February 24, 1971, 42 y.o.   MRN: [id number]  D: Pt denies SI/HI/AVH. Pt is pleasant and cooperative.  "My Ex and my mother think I am losing my mind", " I almost died from Lyme disease, he (ex) wants to keep me sick, and my mom playing along with this" " I REFUSE TO TAKE PSYCH MEDS, it may work for some people, but not for me, Dr Dub Mikes knows me, he will tell you, the plan was not for me to be on medication always, so I weaned myself off"   A: Pt was offered support and encouragement.  Pt was encourage to attend groups. Q 15 minute checks were done for safety.   R:Pt attends groups and interacts  with peers. Pt is not  taking medication.Pt not receptive to treatment and safety maintained on unit.

## 2012-08-25 NOTE — [facility] Counselor (Signed)
Newt Lukes, NP and Reita Cliche in the psych ED to make them both aware of patient's acceptance to [facility] adult unit. Patient's room assignment is 405-1. Per AC-Shalita patient to come after 3pm.
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
## 2012-08-25 NOTE — Progress Notes (Signed)
Patient ID: Tina [name], female   DOB: [birth year], 42 y.o.   MRN: [id number]  D: Pt denies SI/HI/AVH. Pt is   Some what cooperative. Pt argumentative, pt not receptive to treatment, pt isolates in room, pt labile and tangential.  A: Pt was offered support and encouragement. Pt was offered scheduled medications. Pt was encourage to attend groups. Q 15 minute checks were done for safety.   R: Pt is not taking medication.Pt not receptive to treatment and safety maintained on unit.

## 2012-08-25 NOTE — ED Notes (Signed)
[employer] here to transport to [facility]. Belongings x1 bag given to [employer]

## 2012-08-26 ENCOUNTER — Encounter (HOSPITAL_COMMUNITY): Payer: Self-pay | Admitting: Physician Assistant

## 2012-08-26 MED ORDER — HYDROXYZINE HCL 50 MG PO TABS: 50.0000 mg | ORAL_TABLET | Freq: Every evening | ORAL | Status: DC | PRN

## 2012-08-26 MED ORDER — LAMOTRIGINE 25 MG PO TABS
25.0000 mg | ORAL_TABLET | Freq: Every day | ORAL | Status: DC
  Administered 2012-08-26 – 2012-08-27 (×2): 25 mg via ORAL
  Filled 2012-08-26 (×4): qty 1
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
## 2012-08-26 NOTE — Progress Notes (Signed)
Patient ID: [name], female   DOB: 02-25-1971, 42 y.o.   MRN: [id number] Psychoeducational Group Note  Date:  08/26/2012 Time:  1000am  Group Topic/Focus:  Making Healthy Choices:   The focus of this group is to help patients identify negative/unhealthy choices they were using prior to admission and identify positive/healthier coping strategies to replace them upon discharge.  Participation Level:  Did Not Attend  Participation Quality:    Affect:  Cognitive:   Insight:  Engagement in Group:  Additional Comments:  Inventory and Psychoeducational group- pt was with the extender    Valente David 08/26/2012,10:41 AM

## 2012-08-26 NOTE — Progress Notes (Signed)
Patient ID: Tina [name], female   DOB: [birth year], 42 y.o.   MRN: [id number] 08-26-12 nursing shift note: D: this is a new patient and she came onto the 400 hall yesterday. She met with MD and extender this am and her medications were changed. She stated the Depakote did not agree with her. Her medications was changed to Lamictal and she got her 1st dose at 1100 am. Thus far she has not had nay adverse effects for the medication. She continues to ruminate about being discharged and stated she should not be here.  A: staff has encouraged her to take her medications and continue to support and encourage her. RN will monitor and Q 15 min ck's

## 2012-08-26 NOTE — Progress Notes (Signed)
Psychoeducational Group Note  Date:  08/25/2012 Time:  2000  Group Topic/Focus:  Wrap-Up Group:   The focus of this group is to help patients review their daily goal of treatment and discuss progress on daily workbooks.  Participation Level: Did Not Attend  Participation Quality:  Not Applicable  Affect:  Not Applicable  Cognitive:  Not Applicable  Insight:  Not Applicable  Engagement in Group: Not Applicable  Additional Comments:  The patient slept in her bedroom rather than attend group.   Quintina Hakeem S 08/26/2012, 12:24 AM

## 2012-08-26 NOTE — [facility] Group Notes (Signed)
[facility] LCSW Group Therapy  08/26/2012  10:00  Type of Therapy:  Group Therapy  Participation Level:  Active  Participation Quality:  Attentive  Affect:  Appropriate  Cognitive:  Oriented  Insight:  Limited  Engagement in Therapy:  Engaged  Modes of Intervention:  Discussion, Exploration and Socialization  Summary of Progress/Problems:   The main focus of today's process group was to identify the patient's current support system and decide on other supports that can be put in place. Four definitions/levels of support were discussed and an exercise was utilized to show how much stronger we become with additional supports. An emphasis was placed on using counselor, doctor, therapy groups, 12-step groups, and problem-specific support groups to expand supports, as well as doing something different than has been done before. Tina [name] is oppositional and argumentative, but not overly so.  For instance, when I todl the fathers "Happy Father's Day", she said we should also celebrate Mother's day today because without mothers there would be no fathers.  She has extensive knowledge of 12 step programs as demonstrated by giving another group member feedback about how to get help from that group.  She was quick to point out that she gets no support from family, but was not sure who to get increased support from.  "I'm still working on my options."  [employer], LCSW 08/26/2012 12:51 PM

## 2012-08-26 NOTE — [facility] Suicide Risk Assessment (Signed)
Suicide Risk Assessment  Admission Assessment     Nursing information obtained from:   EMR  Demographic factors:    caucasian, divorced, unemployed  Current Mental Status:    General Appearance: Casual  Eye Contact:: Good  Speech: Clear and Coherent and Pressured  Volume: Normal  Mood: Anxious, Irritable and Mildly manic  Affect: Blunt, Inappropriate, Labile, Full Range and Tearful  Thought Process: Circumstantial, Irrelevant, Loose and Tangential  Orientation: Full (Time, Place, and Person)  Thought Content: Obsessions, Paranoid Ideation and Rumination  Suicidal Thoughts: No  Homicidal Thoughts: No Memory: Immediate; Good  Recent; Good  Remote; Good  Judgement: Impaired  Insight: Lacking  Psychomotor Activity: Normal  Concentration: Good  Recall: Good  Akathisia: No  Handed: Right  AIMS (if indicated): Assets: Communication Skills  Resilience  Sleep: Number of Hours: 5   Loss Factors:   Divorce, Inadequate housing, financial stressors.  Historical Factors:   Previous substance Abuse, Possible continued substance abuse Risk Reduction Factors:   Children under 18 depending on patient for care, Social supports  CLINICAL FACTORS:   Bipolar Disorder:   Mixed State Alcohol/Substance Abuse/Dependencies  COGNITIVE FEATURES THAT CONTRIBUTE TO RISK:  Closed-mindedness Loss of executive function Polarized thinking Thought constriction (tunnel vision)    SUICIDE RISK:   Minimal: No identifiable suicidal ideation.  Patients presenting with no risk factors but with morbid ruminations; may be classified as minimal risk based on the severity of the depressive symptoms  PLAN OF CARE: Admit to inpatient unit.   Will complete second IVC Restart patient on Lamictal Today.  I certify that inpatient services furnished can reasonably be expected to improve the patient's condition.  Tina [name] 08/26/2012, 10:45 AM

## 2012-08-26 NOTE — Progress Notes (Signed)
Patient ID: Tina [name], female   DOB: 1970-11-23, 42 y.o.   MRN: [id number] D: Patient lying in bed with eyes closed. Respirations even and non-labored. A: Staff will monitor on q 15 minute checks, follow treatment plan, and give meds as ordered. R: Appears asleep at present.

## 2012-08-27 DIAGNOSIS — F319 Bipolar disorder, unspecified: Secondary | ICD-10-CM

## 2012-08-27 LAB — TSH: TSH: 3.811 u[IU]/mL (ref 0.350–4.500)

## 2012-08-27 LAB — T3, FREE: T3, Free: 2.3 pg/mL (ref 2.3–4.2)

## 2012-08-27 MED ORDER — ARIPIPRAZOLE 5 MG PO TABS
5.0000 mg | ORAL_TABLET | Freq: Every day | ORAL | Status: DC
  Filled 2012-08-27 (×5): qty 1

## 2012-08-27 MED ORDER — LAMOTRIGINE 25 MG PO TABS
25.0000 mg | ORAL_TABLET | Freq: Two times a day (BID) | ORAL | Status: DC
  Administered 2012-08-30 – 2012-08-31 (×3): 25 mg via ORAL
  Filled 2012-08-27 (×12): qty 1

## 2012-08-27 NOTE — Progress Notes (Signed)
Recreation Therapy Notes  Date: 06.16.2014 Time:9:30am  Location: [address] Dayroom  Group Topic/Focus: Stress Managment   Participation Level:  Did not attend - As group was starting patient was asked to leave session by RN to attend treatment team. Patient did not return to session.   Jearl Klinefelter, LRT/ CTRS  [place], Dandrea Widdowson L 08/27/2012 10:22 AM

## 2012-08-27 NOTE — Progress Notes (Signed)
D:Pt stated to writer that she does not have bipolar when asked about mania. She started talking about lyme disease and her husband not understanding. When writer asked about pt's symptoms of lyme disease, she responded bipolar and quickly said "but I am in remission." Pt is guarded and blames her mother and husband for her hospitalization. Pt said that she she decided to separate from her husband and it was not a mutual decision.  A:Supported pt to discuss feelings. Offered encouragement and 15 minute checks. R:Pt is taking medication as ordered and tolerating well. She denies si and hi. She denies hallucinations. Pt is up in the dayroom interacting. Safety maintained on the unit.

## 2012-08-27 NOTE — [facility] Group Notes (Signed)
[facility] LCSW Aftercare Discharge Planning Group Note   08/27/2012 4:44 PM  Participation Quality:  Engaged  Mood/Affect:  Irritable  Depression Rating:  denies  Anxiety Rating:  denies  Thoughts of Suicide:  No Will you contract for safety?   NA  Current AVH:  No  Plan for Discharge/Comments:  States she does not need to be here.  H and M took out IVC papers on her "because they are crossing my boundaries re: my children.  They think I am not a good mother.  I love my children more than anything."  Admits to d/cing meds 3 months ago, and not seeing psychiatrist for even longer.  Limited insight.  Agrees to allow me to call family.  Transportation Means: family  Supports: family  [place], Baldo Daub
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
## 2012-08-27 NOTE — Progress Notes (Signed)
Pt refused Lamictal, "I'm not taking it twice, I only take it once a day"

## 2012-08-27 NOTE — Progress Notes (Signed)
[facility] Group Notes:  (Nursing/MHT/Case Management/Adjunct)  Date:  08/26/2012 Time:  2000  Type of Therapy:  Psychoeducational Skills  Participation Level:  Minimal  Participation Quality:  Inattentive  Affect:  Depressed  Cognitive:  Appropriate  Insight:  Limited  Engagement in Group:  Limited  Modes of Intervention:  Education  Summary of Progress/Problems: The patient suggested that she didn't have a very good day. She mentioned that she is going to "try to make it through" the day. Prior to group, she had a bad telephone call. Her goal for tomorrow is to stay positive.   Tina [name] S 08/27/2012, 2:40 AM
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
## 2012-08-27 NOTE — Progress Notes (Signed)
Child/Adolescent Psychoeducational Group Note  Date:  08/27/2012 Time:  12:06 PM  Group Topic/Focus:  Self Care:   The focus of this group is to help patients understand the importance of self-care in order to improve or restore emotional, physical, spiritual, interpersonal, and financial health.  Participation Level:  Active  Participation Quality:  Appropriate and Attentive  Affect:  Appropriate  Cognitive:  Alert and Appropriate  Insight:  Appropriate  Engagement in Group:  Engaged  Modes of Intervention:  Discussion and Education  Additional Comments:  Pt attended group and participated in group discussion.  Shelly Bombard D 08/27/2012, 12:06 PM

## 2012-08-27 NOTE — [facility] Group Notes (Signed)
[facility] LCSW Group Therapy  08/27/2012 1:15 pm  Type of Therapy: Process Group Therapy  Participation Level:  Active  Participation Quality:  Appropriate  Affect:  Excited  Cognitive:  Oriented  Insight:  None  Engagement in Group:  Limited  Engagement in Therapy:  Limited  Modes of Intervention:  Activity, Clarification, Education, Problem-solving and Support  Summary of Progress/Problems: Today's group addressed the issue of overcoming obstacles.  Patients were asked to identify their biggest obstacle post d/c that stands in the way of their on-going success, and then problem solve as to how to manage this.  Tina [name] externalizes all problems.  She is blaming family for everything, and takes no responsibility of her own.  Not willing to listen to group members pointing out that if she can't control the behavior of others, at least she could control her reaction to them.  Later, though, presenting as if it is her own idea, states that "wherever you go, there you are."  Interpreted this as meaning that your problems follow you, and you need to deal with them in order to be able to succeed.  Not willing to give an example for herself.  [name] 08/27/2012   4:47 PM
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
## 2012-08-27 NOTE — Progress Notes (Signed)
Patient ID: NIYA BEHLER, female   DOB: March 05, 1971, 42 y.o.   MRN: [id number]  D: Pt was anxious and stated she was angry. Pt had just finished tele conv with her husband. Stated she wanted to speak to her son, and her husband asked "why".  Pt stated she became angry, and explained that she misses her children and wanted to speak.  Pt went on to explain the circumstances surrounding her adm. 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 Stated, "hmmm do you think I could just be human". Stating she was slightly overwhelmed. Pt is a pt of Dr Runell Gess. Stated that it was planned that at "some point and time she was to wean herself off her meds. Stated that she continued to forget because of the packing, so decided to use that as her opportunity. Stated she hasn't felt or noticed any side effects from stopping her meds. Pt asked about sleep meds. Informed the writer that Ativan is like "a sugar pill a placebo". Pt requested benadryl and received an order for vistaril.   A:  Support and encouragement was offered. 15 min checks continued for safety.  R: Pt remains safe.

## 2012-08-28 DIAGNOSIS — F3110 Bipolar disorder, current episode manic without psychotic features, unspecified: Principal | ICD-10-CM

## 2012-08-28 NOTE — [facility] Group Notes (Signed)
[facility] LCSW Aftercare Discharge Planning Group Note   08/28/2012 5:14 PM  Participation Quality:  Minimal  Mood/Affect:  Irritable and [place]  Depression Rating:    Anxiety Rating:    Thoughts of Suicide:  No  Will you contract for safety?   NA  Current AVH:  No  Plan for Discharge/Comments:  Tina [name] is in a bad mood, and laying for a fight.  She is mad at her husband, mad at her mother, mad at the Dr., and is looking for opportunities to reinforce the message.  She refused Abilify because the Dr did not tell her he was starting her on it, and she is looking forward to meeting with him to let him know herself.  Also demanding that she be transferred to Dr Dub Mikes.  Told her she would need to talk to Dr about that.  Transportation Means:   Supports:  Tina [name] B

## 2012-08-28 NOTE — [facility] Suicide Risk Assessment (Addendum)
[facility] Adult Inpatient Family/Significant Other Suicide Prevention Education  Suicide Prevention Education:   Patient Refusal for Family/Significant Other Suicide Prevention Education: The patient has refused to provide written consent for family/significant other to be provided Family/Significant Other Suicide Prevention Education during admission and/or prior to discharge.  Physician notified.  CSW provided suicide prevention information with patient.    The suicide prevention education provided includes the following:  Suicide risk factors  Suicide prevention and interventions  National Suicide Hotline telephone number  [facility] assessment telephone number  [facility] Emergency Assistance 911  [facility] and/or Residential Mobile Crisis Unit telephone number   Tina [name], [place] 08/31/2012 9:06 AM

## 2012-08-28 NOTE — Clinical Social Work Note (Signed)
Spoke with husband who states her behavior has steadily been deteriorating since going off of meds 3 months ago.  Besides labile mood and irritability and rage, he also gives example of grandiosity and thoughts that are not grounded in reality [delusions]. For example, she believed Oprah was coming to interview her.   When she gets this way, she is neglectful of children as she is so preoccupied with her own internal process.  They are to close on the sale of the house next Thursday, and he is hopeful that she will be stabilized by that time.

## 2012-08-28 NOTE — Progress Notes (Signed)
Patient ID: Tina [name], female   DOB: [birth year], 42 y.o.   MRN: [id number]    D: Pt refused her abilify. When asked if the dr explained what was added and why the pt stated "no". Stated, "he didn't need to tell me about abilify, because I know all about it". Stated that she has history with abilify. Pt began to cry and tell details about the relationship between her and her husband. Stated he's making the wrong choice by letting her mother and her sister keep/help with the kids.   A:  Support and encouragement was offered. 15 min checks continued for safety.  R: Pt remains safe.

## 2012-08-28 NOTE — Progress Notes (Deleted)
Adult Psychoeducational Group Note  Date:  08/28/2012 Time:  10:32 AM  Group Topic/Focus:  Recovery Goals:   The focus of this group is to identify appropriate goals for recovery and establish a plan to achieve them.  Participation Level:  Active  Participation Quality:  Redirectable  Affect:  Appropriate  Cognitive:  Appropriate  Insight: Good  Engagement in Group:  Distracting, Engaged and Off Topic  Modes of Intervention:  Discussion  Additional Comments:  Pt would not directly discuss personal goals. Pt would give advice and comments to her peers and not self. Pt was redirected many times during group and referred to herself as "Mother Hen".   Tora Perches N 08/28/2012, 10:32 AM

## 2012-08-28 NOTE — [facility] Group Notes (Signed)
[facility] LCSW Group Therapy  08/28/2012 , 5:20 PM   Type of Therapy:  Group Therapy  Participation Level:  Active  Participation Quality:  Attentive  Affect:  Appropriate  Cognitive:  Alert  Insight:  Improving  Engagement in Therapy:  Engaged  Modes of Intervention:  Discussion, Exploration and Socialization  Summary of Progress/Problems: Today's group focused on the term Diagnosis.  Participants were asked to define the term, and then pronounce whether it is a negative, positive or neutral term.  Sofi was not angry like this AM.  She did not engage specifically in the conversation about diagnosis other than to make it clear that she rejects the medical profession, labels, diagnoses and medications in general.  However, she did do a nice job of questioning other patients about their beliefs-not in a challenging way but a thought-provoking way.  She also gave them positive feedback.  Daryel Gerald B 08/28/2012 , 5:20 PM

## 2012-08-28 NOTE — Progress Notes (Signed)
D: Patient denies SI/HI and A/V hallucinations; patient reports sleep is well; reports appetite is good; reports energy level is normal ; reports ability to pay attention is good; patient denies depression, hopelessness, and anxiety; patient reports " I do not want to take my lamictal because Im ramping up to fast and it can cause Trudie Buckler syndrome I really don't want these medicines pushed on me because I dont need them"  A: Monitored q 15 minutes; patient encouraged to attend groups; patient educated about medications; patient given medications per physician orders; patient encouraged to express feelings and/or concerns  R: Patient can be labile; patient can be pleasant and cooperative until approaching her about medications; patient was defensive when refusing medications but suddenly had a smile on her face once she realized her concerns were made clear patient's interaction with staff and peers is appropriate and very minimal;  patient is refusing to take his medications as prescribed; patient is attending some of groups

## 2012-08-28 NOTE — Progress Notes (Signed)
Pt began speaking with chaplain in 400 day room.   Seated on couch, talkative.    Expressed frustration around admission, feeling that her husband and mother are conspiring to make her seem "crazy."  Spoke at length about feeling that she must choose between taking medications that she feels she does not need / are harmful in order to be discharged, and staying in hospital where she is not able to be present with children and feels they are going to be harmed / corrupted by family.   Chaplain provided empathic presence, support around pt's fears, feeling of being overwhelmed / anxiety, reinforced ability to rest at [facility] and find ways to feel more grounded.    Pt decided to attend gym time with other hall members in order to get off unit and "change scenery."  Will continue to follow for support.

## 2012-08-28 NOTE — Progress Notes (Signed)
Adult Psychoeducational Group Note  Date:  08/28/2012 Time:  11:36 AM  Group Topic/Focus:  Recovery Goals:   The focus of this group is to identify appropriate goals for recovery and establish a plan to achieve them.  Participation Level:  Minimal  Participation Quality:  Appropriate  Affect:  Appropriate  Cognitive:  Appropriate  Insight: Appropriate  Engagement in Group:  Engaged  Modes of Intervention:  Discussion  Additional Comments:  Pt was late to group but did state one of her goals was working on "being herself".   Tora Perches N 08/28/2012, 11:36 AM

## 2012-08-28 NOTE — Progress Notes (Signed)
[facility] MD Progress Note  08/28/2012 12:14 PM Tina [name]  MRN:  [id number] Subjective:  Tina [name] admits that she is refusing to take her medication. She states she does not like the way it is being handled, and the increase in the dose of Lamictal, as well as the initiation of Abilify were not discussed with her. She states that she was surprised by these changes. She expresses significant concern about the development of Stevens-Johnson syndrome, although she admits that she has not had problems while taking Lamictal in the past. She states that she does not need medication and she does not need to be here.  Diagnosis:   Axis I: Bipolar, Manic Axis II: Cluster B Traits Axis III:  Past Medical History  Diagnosis Date  . History of chicken pox   . Hypothyroidism   . Genital warts   . Depression   . Bipolar 2 disorder     ADL's:  Intact  Sleep: Fair  Appetite:  Good  Suicidal Ideation:  Patient ended interview for further suicidal ideation to be determined Homicidal Ideation:  Patient ended interview before homicidal ideation could be determined AEB (as evidenced by):  Psychiatric Specialty Exam: Review of Systems  Constitutional: Negative.   HENT: Negative.   Eyes: Negative.   Respiratory: Negative.   Cardiovascular: Negative.   Gastrointestinal: Negative.   Genitourinary: Negative.   Musculoskeletal: Negative.   Skin: Negative.   Neurological: Negative.   Endo/Heme/Allergies: Negative.     Blood pressure 104/76, pulse 71, temperature 98.2 F (36.8 C), temperature source Oral, resp. rate 16, height 5' (1.524 m), weight 49.896 kg (110 lb), SpO2 99.00%.Body mass index is 21.48 kg/(m^2).  General Appearance: Disheveled  Eye Contact::  Good  Speech:  Clear and Coherent and Pressured  Volume:  Normal  Mood:  Irritable and manic  Affect:  Congruent  Thought Process:  Circumstantial and Irrelevant  Orientation:  Full (Time, Place, and Person)  Thought Content:  Obsessions   Suicidal Thoughts:  unknown  Homicidal Thoughts:  unknown  Memory:  Immediate;   Good Recent;   Good Remote;   Good  Judgement:  Poor  Insight:  Shallow  Psychomotor Activity:  Normal  Concentration:  Good  Recall:  Good  Akathisia:  No  Handed:  Right  AIMS (if indicated):     Assets:  Communication Skills Physical Health  Sleep:  Number of Hours: 5   Current Medications: Current Facility-Administered Medications  Medication Dose Route Frequency Provider Last Rate Last Dose  . acetaminophen (TYLENOL) tablet 650 mg  650 mg Oral Q6H PRN Earney Navy, NP      . alum & mag hydroxide-simeth (MAALOX/MYLANTA) 200-200-20 MG/5ML suspension 30 mL  30 mL Oral Q4H PRN Earney Navy, NP      . ARIPiprazole (ABILIFY) tablet 5 mg  5 mg Oral QHS Mojeed Akintayo      . hydrOXYzine (ATARAX/VISTARIL) tablet 50 mg  50 mg Oral QHS PRN,MR X 1 Court Joy, PA-C      . lamoTRIgine (LAMICTAL) tablet 25 mg  25 mg Oral BID Mojeed Akintayo      . LORazepam (ATIVAN) injection 1 mg  1 mg Intramuscular Q6H PRN Earney Navy, NP      . LORazepam (ATIVAN) tablet 1 mg  1 mg Oral Q8H PRN Shuvon Rankin, NP      . magnesium hydroxide (MILK OF MAGNESIA) suspension 30 mL  30 mL Oral Daily PRN Shuvon Rankin, NP   30 mL  at 08/26/12 1707    Lab Results:  Results for orders placed during the hospital encounter of 08/25/12 (from the past 48 hour(s))  TSH     Status: None   Collection Time    08/26/12  7:35 PM      Result Value Range   TSH 3.811  0.350 - 4.500 uIU/mL  T3, FREE     Status: None   Collection Time    08/26/12  7:35 PM      Result Value Range   T3, Free 2.3  2.3 - 4.2 pg/mL  T4, FREE     Status: None   Collection Time    08/26/12  7:35 PM      Result Value Range   Free T4 0.98  0.80 - 1.80 ng/dL    Physical Findings: AIMS: Facial and Oral Movements Muscles of Facial Expression: None, normal Lips and Perioral Area: None, normal Jaw: None, normal Tongue: None,  normal,Extremity Movements Upper (arms, wrists, hands, fingers): None, normal Lower (legs, knees, ankles, toes): None, normal, Trunk Movements Neck, shoulders, hips: None, normal, Overall Severity Severity of abnormal movements (highest score from questions above): None, normal Incapacitation due to abnormal movements: None, normal Patient's awareness of abnormal movements (rate only patient's report): No Awareness, Dental Status Current problems with teeth and/or dentures?: No Does patient usually wear dentures?: No  CIWA:  CIWA-Ar Total: 1 COWS:  COWS Total Score: 1  Treatment Plan Summary: Daily contact with patient to assess and evaluate symptoms and progress in treatment Medication management  Plan: We will continue her current plan of care and crit encourage her to take her medications as ordered. We will monitor for changes in mood and behavior, and treat as appropriate.  Medical Decision Making Problem Points:  Established problem, worsening (2) and Review of psycho-social stressors (1) Data Points:  Review or order clinical lab tests (1) Review of medication regiment & side effects (2)  I certify that inpatient services furnished can reasonably be expected to improve the patient's condition.   Tina [name] 08/28/2012, 12:14 PM

## 2012-08-29 DIAGNOSIS — F3110 Bipolar disorder, current episode manic without psychotic features, unspecified: Secondary | ICD-10-CM

## 2012-08-29 NOTE — Progress Notes (Signed)
Patient ID: Tina [name], female   DOB: May 08, 1970, 42 y.o.   MRN: [id number] [facility] MD Progress Note  08/29/2012 10:16 AM SHARNA GABRYS  MRN:  [id number] Subjective: "I am not going to take any medication, I am allergic to all of them." Objective: Patient has been refusing  to take her medications since yesterday, she is claiming to be allergic to all of them. Patient is labile, rude to the providers, oppositional and defiant. She became agitated and verbally abusive when she was told the need for her to take her medications. Patient has difficulty getting along with peers and providers. She has no insight into her problem and showing poor judgment.  Diagnosis:  Axis I: Bipolar 1 disorder-recent episode manic                                Cluster B traits  ADL's:  Intact  Sleep: fair  Appetite:  Fair  Suicidal Ideation:  Plan:  denies Intent:  denies Means:  denies Homicidal Ideation:  Plan:  denies Intent:  denies Means:  denies AEB (as evidenced by):  Psychiatric Specialty Exam: Review of Systems  Constitutional: Negative.   HENT: Negative.   Eyes: Negative.   Respiratory: Negative.   Cardiovascular: Negative.   Gastrointestinal: Negative.   Genitourinary: Negative.   Musculoskeletal: Negative.   Skin: Negative.   Neurological: Negative.   Endo/Heme/Allergies: Negative.   Psychiatric/Behavioral: The patient is nervous/anxious and has insomnia.     Blood pressure 104/76, pulse 71, temperature 98.2 F (36.8 C), temperature source Oral, resp. rate 16, height 5' (1.524 m), weight 49.896 kg (110 lb), SpO2 99.00%.Body mass index is 21.48 kg/(m^2).  General Appearance: Fairly Groomed  Patent attorney::  Good  Speech:  Pressured and talkative  Volume:  Increased  Mood:  Irritable  Affect:  Labile and Full Range  Thought Process:  Circumstantial  Orientation:  Full (Time, Place, and Person)  Thought Content:  NA  Suicidal Thoughts:  No  Homicidal Thoughts:  No  Memory:   Immediate;   Fair Recent;   Fair Remote;   Fair  Judgement:  Poor  Insight:  Lacking  Psychomotor Activity:  Increased  Concentration:  Fair  Recall:  Fair  Akathisia:  No  Handed:  Right  AIMS (if indicated):     Assets:  Physical Health Social Support  Sleep:  Number of Hours: 6   Current Medications: Current Facility-Administered Medications  Medication Dose Route Frequency Provider Last Rate Last Dose  . acetaminophen (TYLENOL) tablet 650 mg  650 mg Oral Q6H PRN Earney Navy, NP      . alum & mag hydroxide-simeth (MAALOX/MYLANTA) 200-200-20 MG/5ML suspension 30 mL  30 mL Oral Q4H PRN Earney Navy, NP      . ARIPiprazole (ABILIFY) tablet 5 mg  5 mg Oral QHS Haley Fuerstenberg      . hydrOXYzine (ATARAX/VISTARIL) tablet 50 mg  50 mg Oral QHS PRN,MR X 1 Court Joy, PA-C      . lamoTRIgine (LAMICTAL) tablet 25 mg  25 mg Oral BID Meghin Thivierge      . LORazepam (ATIVAN) injection 1 mg  1 mg Intramuscular Q6H PRN Earney Navy, NP      . LORazepam (ATIVAN) tablet 1 mg  1 mg Oral Q8H PRN Shuvon Rankin, NP      . magnesium hydroxide (MILK OF MAGNESIA) suspension 30 mL  30 mL Oral  Daily PRN Shuvon Rankin, NP   30 mL at 08/26/12 1707    Lab Results:  No results found for this or any previous visit (from the past 48 hour(s)).  Physical Findings: AIMS: Facial and Oral Movements Muscles of Facial Expression: None, normal Lips and Perioral Area: None, normal Jaw: None, normal Tongue: None, normal,Extremity Movements Upper (arms, wrists, hands, fingers): None, normal Lower (legs, knees, ankles, toes): None, normal, Trunk Movements Neck, shoulders, hips: None, normal, Overall Severity Severity of abnormal movements (highest score from questions above): None, normal Incapacitation due to abnormal movements: None, normal Patient's awareness of abnormal movements (rate only patient's report): No Awareness, Dental Status Current problems with teeth and/or dentures?:  No Does patient usually wear dentures?: No  CIWA:  CIWA-Ar Total: 1 COWS:  COWS Total Score: 1  Treatment Plan Summary: Daily contact with patient to assess and evaluate symptoms and progress in treatment Medication management  Plan:1. Admit for crisis management and stabilization. 2. Medication management to reduce current symptoms to base line and improve the     patient's overall level of functioning 3. Treat health problems as indicated. 4. Develop treatment plan to decrease risk of relapse upon discharge and the need for     readmission. 5. Psycho-social education regarding relapse prevention and self care. 6. Health care follow up as needed for medical problems. 7. Restart home medications where appropriate. 8. Increase Lamictal 25 mg po BID 9. Initiate Abilify 5mg  po QHS for mood stabilization 10. Patient encouraged to take her medications, will seek second opinion for forced medication if patient continues to refuse 11. Case manager to put patient on the list for transfer to [facility] hospital.  Medical Decision Making Problem Points:  Established problem, worsening (1), Review of last therapy session (1) and Review of psycho-social stressors (1) Data Points:  Order Aims Assessment (2) Review of medication regiment & side effects (2) Review of new medications or change in dosage (2)  I certify that inpatient services furnished can reasonably be expected to improve the patient's condition.   Thedore Mins, MD 08/29/2012, 10:16 AM

## 2012-08-29 NOTE — [facility] Group Notes (Signed)
[facility] LCSW Group Therapy  08/29/2012  1:15 PM   Type of Therapy:  Group Therapy  Participation Level:  Active  Participation Quality:  Appropriate and Attentive  Affect:  Appropriate but tearful when the presenter played the guitar  Cognitive:  Alert and Appropriate  Insight:  Developing/Improving  Engagement in Therapy:  Developing/Improving  Modes of Intervention:  Activity, Clarification, Discussion, Education, Exploration, Limit-setting, Orientation, Problem-solving, Rapport Building, Dance movement psychotherapist, Socialization and Support  Summary of Progress/Problems: Patient was attentive and engaged with speaker from [employer].  Presenter shared his story in regards to mental health and played the guitar for the group.  Patient expressed interest in their programs and services and received information on their agency. Patient was attentive and actively listened to presenter.      [name], LCSWA 08/29/2012 2:54 PM

## 2012-08-29 NOTE — [facility] Group Notes (Signed)
[facility] LCSW Aftercare Discharge Planning Group Note   08/29/2012 8:45 AM  Participation Quality:  Alert and Appropriate   Mood/Affect:  Appropriate but Flat   Depression Rating:  0  Anxiety Rating:  0  Thoughts of Suicide:  Pt denies SI/HI  Will you contract for safety?   Yes  Current AVH:  Pt denies AVH  Plan for Discharge/Comments:  Pt attended discharge planning group and actively participated in group.  CSW provided pt with today's workbook.  Pt presented guarded and vague with answers to questions.  Pt states that the police brought her here.  Pt states that she will not take medication so therefore doesn't need a referral for outpatient services.  CSW will continue to assess pt's needs as she become more stable.  No further needs voiced by pt at this time.    Transportation Means: Pt reports having access to transportation  Supports: No supports mentioned at this time  [name], LCSWA 08/29/2012 10:37 AM

## 2012-08-29 NOTE — Progress Notes (Signed)
Seen and agreed. Krrish Freund, MD 

## 2012-08-29 NOTE — Progress Notes (Signed)
Recreation Therapy Notes  Date: 06.18.2014  Time: 10:30am Location: 400 Hall Dayroom     Group Topic/Focus: Self Esteem  Participation Level: Did not attend  Jearl Klinefelter, LRT/CTRS  Jearl Klinefelter 08/29/2012 4:39 PM

## 2012-08-29 NOTE — Tx Team (Signed)
Interdisciplinary Treatment Plan Update (Adult)  Date: 08/29/2012  Time Reviewed:  9:45 AM  Progress in Treatment: Attending groups: Yes Participating in groups:  Yes Taking medication as prescribed:  Yes Tolerating medication:  Yes Family/Significant othe contact made: Yes Patient understands diagnosis:  Yes Discussing patient identified problems/goals with staff:  Yes Medical problems stabilized or resolved:  Yes Denies suicidal/homicidal ideation: Yes Issues/concerns per patient self-inventory:  Yes Other:  New problem(s) identified: N/A  Discharge Plan or Barriers: CSW assessing for appropriate referrals.  Pt will be put on forced meds today and put on the [facility] waiting list due to labile mood and refusing to take meds.    Reason for Continuation of Hospitalization: Anxiety Depression Medication Stabilization  Comments: N/A  Estimated length of stay: 3-5 days  For review of initial/current patient goals, please see plan of care.  Attendees: Patient:  Tina [name]  08/29/2012 9:48 AM   Family:     Physician:  Dr. Thedore Mins 08/29/2012 9:48 AM   Nursing:   Burnetta Sabin, RN 08/29/2012 9:48 AM   Clinical Social Worker:  [name], LCSWA 08/29/2012 9:48 AM   Other: Liborio Nixon, RN 08/29/2012 9:48 AM   Other:     Other:     Other:     Other:    Other:    Other:    Other:    Other:    Other:     Scribe for Treatment Team:   Carmina Miller, 08/29/2012 9:48 AM

## 2012-08-29 NOTE — Clinical Social Work Note (Signed)
CSW made referral to [facility] on this date, per discussion with treatment team today.  CSW will monitor this referral.    [name], LCSWA 08/29/2012  3:16 PM

## 2012-08-29 NOTE — Progress Notes (Signed)
D: Patient denies SI/HI and A/V hallucinations; patient reports sleep is well; reports appetite is good; reports energy level is normal ; reports ability to pay attention is good; rates depression as 0/10; rates hopelessness 0/10; rates anxiety as 0/10;   A: Monitored q 15 minutes; patient encouraged to attend groups; patient educated about medications; patient given medications per physician orders; patient encouraged to express feelings and/or concerns  R: Patient is calm and cooperatives most of the time; patient's interaction with staff and peers is appropriate;  patient is not taking medications as prescribed ; patient is attending all groups and engaging

## 2012-08-30 MED ORDER — HALOPERIDOL 5 MG PO TABS
5.0000 mg | ORAL_TABLET | Freq: Two times a day (BID) | ORAL | Status: DC
  Filled 2012-08-30 (×2): qty 1

## 2012-08-30 MED ORDER — HALOPERIDOL LACTATE 5 MG/ML IJ SOLN
5.0000 mg | Freq: Two times a day (BID) | INTRAMUSCULAR | Status: DC
  Filled 2012-08-30 (×2): qty 1

## 2012-08-30 MED ORDER — LORAZEPAM 2 MG/ML IJ SOLN
INTRAMUSCULAR | Status: AC
  Administered 2012-08-30: 2 mg
  Filled 2012-08-30: qty 1

## 2012-08-30 MED ORDER — HALOPERIDOL LACTATE 5 MG/ML IJ SOLN
INTRAMUSCULAR | Status: AC
  Administered 2012-08-30: 5 mg
  Filled 2012-08-30: qty 1

## 2012-08-30 MED ORDER — HALOPERIDOL 5 MG PO TABS
5.0000 mg | ORAL_TABLET | Freq: Every day | ORAL | Status: DC
  Administered 2012-08-30 – 2012-09-02 (×4): 5 mg via ORAL
  Filled 2012-08-30: qty 1
  Filled 2012-08-30: qty 7
  Filled 2012-08-30 (×5): qty 1

## 2012-08-30 MED ORDER — DIPHENHYDRAMINE HCL 50 MG/ML IJ SOLN
INTRAMUSCULAR | Status: AC
  Administered 2012-08-30: 50 mg
  Filled 2012-08-30: qty 1

## 2012-08-30 MED ORDER — HALOPERIDOL LACTATE 5 MG/ML IJ SOLN
5.0000 mg | Freq: Every day | INTRAMUSCULAR | Status: DC
  Filled 2012-08-30 (×6): qty 1

## 2012-08-30 NOTE — [facility] Group Notes (Signed)
[facility] LCSW Group Therapy  08/30/2012  1:15 PM   Type of Therapy:  Group Therapy  Participation Level:  Active  Participation Quality:  Appropriate and Attentive  Affect:  Appropriate  Cognitive:  Alert and Appropriate  Insight:  Developing/Improving and Engaged  Engagement in Therapy:  Developing/Improving and Engaged  Modes of Intervention:  Activity, Clarification, Confrontation, Discussion, Education, Exploration, Limit-setting, Orientation, Problem-solving, Rapport Building, Dance movement psychotherapist, Socialization and Support  Summary of Progress/Problems: The topic for group was balance in life.  Pt first reviewed quotes referring to balance and related it to their own lives.  Pt participated in the discussion about when their life was in balance and out of balance and how this feels.  Pt discussed ways to get back in balance and short term goals they can work on to get where they want to be.  Pt did not share anything personally and states that she is having issues that may cause her to come in and out of group today.  Pt asked to just listen to group and not participate today.  Pt actively listened to group discussion and was engaged in what others shared.    Tina [name], [place] 08/30/2012 2:32 PM

## 2012-08-30 NOTE — [facility] Group Notes (Signed)
[facility] LCSW Aftercare Discharge Planning Group Note   08/30/2012 8:45 AM  Participation Quality:  Alert and Appropriate   Mood/Affect:  Appropriate but Manic  Depression Rating:  0  Anxiety Rating:  0  Thoughts of Suicide:  Pt denies SI/HI  Will you contract for safety?   Yes  Current AVH:  Pt denies AVH  Plan for Discharge/Comments:  Pt attended discharge planning group and actively participated in group.  CSW provided pt with today's workbook.  Pt states that she is having a crappy day because she feels betrayed by everyone.  Pt states that her home has been cleared out and her stuff put in storage.  Pt states that she is basically homeless but reports she has family she could stay with or her husband's apartment, but chooses not to.  Pt states that her mom got her way by putting her in here.  Pt states that she will make this her permanent resident since we want her here.  Pt is hyperverbal today and did not voice any d/c plan at this time.  [facility] referral has been made.  No further needs voiced by pt at this time.    Transportation Means: Pt reports having access to transportation  Supports: No supports mentioned at this time  [name], LCSWA 08/30/2012 10:37 AM

## 2012-08-30 NOTE — Progress Notes (Signed)
Patient ID: Tina [name], female   DOB: 1971-01-26, 42 y.o.   MRN: [id number]   D:  Writer discussed pt's refusal of meds. Pt stated that her Dr "pulled it out of her".  Stated, "I don't know what he wanted me to say." Pt stated she felt like the Dr wanted her to say she was allergic to all the meds. Writer asked pt what the Dr would have to gain by wanted that. Pt stated, "I don't know, but I don't think he got what he wanted, then he got mad".  Pt stated, " to tell the truth, I'm not really allergic to them". However, pt stated they don't work for her. Stated she's been on several different meds and none of them seem to help. Informed pt that it's not wise to say she's allergic, if in fact she isn't.   A:  Support and encouragement was offered. 15 min checks continued for safety.  R: Pt remains safe.

## 2012-08-30 NOTE — Progress Notes (Addendum)
Patient ID: Tina [name], female   DOB: October 19, 1970, 42 y.o.   MRN: [id number] [facility] MD Progress Note  08/30/2012 3:15 PM Tina [name]  MRN:  [id number] Subjective: "I'm not going to take any medication, so I don't need to talk." Objective: Patient has been refusing  to take her medications since yesterday, she is rude hostile and labile with the staff. She reports that she has Lyme disease and that's why she has symptoms. Shows no judgement and poor insight.  Diagnosis:  Axis I: Bipolar 1 disorder-recent episode manic                                Cluster B traits  ADL's:  Intact  Sleep: fair  Appetite:  Fair  Suicidal Ideation:  Plan:  denies Intent:  denies Means:  denies Homicidal Ideation:  Plan:  denies Intent:  denies Means:  denies AEB (as evidenced by):  Psychiatric Specialty Exam: Review of Systems  Constitutional: Negative.  Negative for fever, chills, weight loss, malaise/fatigue and diaphoresis.  HENT: Negative.  Negative for congestion and sore throat.   Eyes: Negative.  Negative for blurred vision, double vision and photophobia.  Respiratory: Negative.  Negative for cough, shortness of breath and wheezing.   Cardiovascular: Positive for leg swelling. Negative for chest pain, palpitations and PND.  Gastrointestinal: Negative.  Negative for heartburn, nausea, vomiting, abdominal pain, diarrhea and constipation.  Genitourinary: Negative.   Musculoskeletal: Negative.  Negative for myalgias, joint pain and falls.  Skin: Negative.   Neurological: Negative.  Negative for dizziness, tingling, tremors, sensory change, speech change, focal weakness, seizures, loss of consciousness, weakness and headaches.  Endo/Heme/Allergies: Negative.  Negative for polydipsia. Does not bruise/bleed easily.  Psychiatric/Behavioral: Negative for depression, suicidal ideas, hallucinations, memory loss and substance abuse. The patient is nervous/anxious and has insomnia.     Blood pressure  109/77, pulse 70, temperature 98.5 F (36.9 C), temperature source Oral, resp. rate 16, height 5' (1.524 m), weight 49.896 kg (110 lb), SpO2 99.00%.Body mass index is 21.48 kg/(m^2).  General Appearance: Fairly Groomed  Patent attorney::  Good  Speech:  Clear and goal directed/  Volume:  Increased  Mood:  Irritable  Affect:  Labile and Full Range  Thought Process:  Circumstantial  Orientation:  Full (Time, Place, and Person)  Thought Content:  NA  Suicidal Thoughts:  No  Homicidal Thoughts:  No  Memory:  Immediate;   Fair Recent;   Fair Remote;   Fair  Judgement:  Poor  Insight:  Lacking  Psychomotor Activity:  Increased  Concentration:  Fair  Recall:  Fair  Akathisia:  No  Handed:  Right  AIMS (if indicated):     Assets:  Physical Health Social Support  Sleep:  Number of Hours: 5.25   Current Medications: Current Facility-Administered Medications  Medication Dose Route Frequency Provider Last Rate Last Dose  . acetaminophen (TYLENOL) tablet 650 mg  650 mg Oral Q6H PRN Earney Navy, NP      . alum & mag hydroxide-simeth (MAALOX/MYLANTA) 200-200-20 MG/5ML suspension 30 mL  30 mL Oral Q4H PRN Earney Navy, NP      . ARIPiprazole (ABILIFY) tablet 5 mg  5 mg Oral QHS Mojeed Akintayo      . hydrOXYzine (ATARAX/VISTARIL) tablet 50 mg  50 mg Oral QHS PRN,MR X 1 Court Joy, PA-C      . lamoTRIgine (LAMICTAL) tablet 25 mg  25  mg Oral BID Mojeed Akintayo      . LORazepam (ATIVAN) injection 1 mg  1 mg Intramuscular Q6H PRN Earney Navy, NP      . LORazepam (ATIVAN) tablet 1 mg  1 mg Oral Q8H PRN Shuvon Rankin, NP      . magnesium hydroxide (MILK OF MAGNESIA) suspension 30 mL  30 mL Oral Daily PRN Shuvon Rankin, NP   30 mL at 08/30/12 1200    Lab Results:  No results found for this or any previous visit (from the past 48 hour(s)).  Physical Findings: AIMS: Facial and Oral Movements Muscles of Facial Expression: None, normal Lips and Perioral Area: None,  normal Jaw: None, normal Tongue: None, normal,Extremity Movements Upper (arms, wrists, hands, fingers): None, normal Lower (legs, knees, ankles, toes): None, normal, Trunk Movements Neck, shoulders, hips: None, normal, Overall Severity Severity of abnormal movements (highest score from questions above): None, normal Incapacitation due to abnormal movements: None, normal Patient's awareness of abnormal movements (rate only patient's report): No Awareness, Dental Status Current problems with teeth and/or dentures?: No Does patient usually wear dentures?: No  CIWA:  CIWA-Ar Total: 1 COWS:  COWS Total Score: 1  Treatment Plan Summary: Daily contact with patient to assess and evaluate symptoms and progress in treatment Medication management  Plan: 1. Admit for crisis management and stabilization. 2. Medication management to reduce current symptoms to base line and improve the     patient's overall level of functioning 3. Treat health problems as indicated. 4. Develop treatment plan to decrease risk of relapse upon discharge and the need for     readmission. 5. Psycho-social education regarding relapse prevention and self care. 6. Health care follow up as needed for medical problems. 7. Restart home medications where appropriate. 8. Increase Lamictal 25 mg po BID 9. Haldol 5mg  IM to start is given after MD completed 2nd opinion. 10. 11. Case manager to put patient on the list for transfer to [facility] hospital. 12. Will give Haldol 5mg  po at hs tonight, and discuss further intervention tomorrow if patient is willing and able to discuss her care. Medical Decision Making Problem Points:  Established problem, worsening (1), Review of last therapy session (1) and Review of psycho-social stressors (1) Data Points:  Order Aims Assessment (2) Review of medication regiment & side effects (2) Review of new medications or change in dosage (2)  I certify that inpatient services furnished can  reasonably be expected to improve the patient's condition.   Rona Ravens. Mashburn RPAC 3:26 PM 08/30/2012   Patient is seen and case discussed with treatment team, agree with forced medication as she has no insight and easily getting upset and agitated and refusing medications ordered to her and Reviewed the information documented and agree with the treatment plan.   Caroline Matters,JANARDHAHA R. 08/30/2012 5:25 PM

## 2012-08-30 NOTE — Progress Notes (Signed)
D: Patient denies SI/HI and auditory and visual hallucinations. The patient has a labile mood and affect. The patient rates her depression and hopelessness both a 0 out of 10 (1 low/10 high). The patient reports sleeping well and writes that her appetite is good and that her energy level is normal. The patient is refusing to take her medications. The patient states that she "is fine" and that she "doesn't need anything." The patient is still slightly disorganized in her thinking and reports that she "doesn't need to be here."  A: Patient given emotional support from RN. Patient encouraged to come to staff with concerns and/or questions. Patient's medication routine continued. Patient's orders and plan of care reviewed. MD notified of patient's refusal to take medications.   R: Patient remains safe. No new orders given by MD for medication refusal. Will continue to monitor patient q15 minutes for safety.

## 2012-08-31 MED ORDER — LAMOTRIGINE 25 MG PO TABS
50.0000 mg | ORAL_TABLET | Freq: Two times a day (BID) | ORAL | Status: DC
  Administered 2012-09-01 – 2012-09-03 (×5): 50 mg via ORAL
  Filled 2012-08-31 (×2): qty 2
  Filled 2012-08-31: qty 28
  Filled 2012-08-31 (×5): qty 2
  Filled 2012-08-31: qty 28

## 2012-08-31 NOTE — Progress Notes (Signed)
Recreation Therapy Notes  Date: 06.20.2014 Time: 9:30am Location: [address] Dayroom      Group Topic/Focus: Leisure Education  Participation Level: Active  Participation Quality: Appropriate  Affect: Euthymic  Cognitive: Appropriate   Additional Comments: Activity: Adapted On Deck ; Explanation: Patients were asked to either draw or act out a leisure/recreation activity, action was determined by rolling a large adapted di. Patients selected leisure/recreation activities out of a container. Peers were asked to identify the activity patient acted out or drew.   Patient actively participated in group activity. Patient acted out or drew Financial trader activities appropriately. Patient successfully guessed peers actions or drawings. Patient interacted well with peers in group. Patient contributed to wrap up discussion about the importance and benefit of leisure/recreation. Patient stated her kids are a big part of her leisure/recreation. Patient stated her and her children often break out into song. Patient shared a lengthy story about her kids playing with side walk chalk. Patient described the drawings her children drew and became animated while sharing the story. Patient rose from her chair to act out the story she was sharing. LRT redirected patient back to wrap up discussion, patient needed additional prompt to focus attention on wrap up discussion.   Prior to group starting patient shared with LRT that a group the adolescent patients participated in on the courtyard made her feel very emotional. Patient shared that the activity made her tear up and she thought it was so nice to see the nice things the adolescents had written about themselves and their peers in chalk on the courtyard. Patient shared that she was a patient here when [facility] was [facility] and they did not participate in activities like that so she was impressed to see what we were doing with the adolescent patients here. Patient  additionally shared she has kept in touch with patients from pervious admissions and unfortunately some of them have chosen to end their lives. Patient suggested completing activity on courtyard with all patients to help boost patient self-esteem. LRT thanked patient for her input.   Tina [name], LRT/CTRS   Tina [name] 08/31/2012 12:40 PM

## 2012-08-31 NOTE — Progress Notes (Signed)
Patient ID: Tina [name], female   DOB: May 06, 1970, 42 y.o.   MRN: [id number]  D: Pt denies SI/HI/AVH/pain. Pt is pleasant and cooperative. Pt states she understands having to take the medication even though she disagrees. Pt states she is sad about the situation, but will cooperate.   A: Pt was offered support and encouragement. Pt was given scheduled medications. Pt was encourage to attend groups. Q 15 minute checks were done for safety.   R:Pt attends groups and interacts well with peers and staff. Pt is taking medication. Pt has no complaints at this time.Pt receptive to treatment and safety maintained on unit.

## 2012-08-31 NOTE — [facility] Group Notes (Signed)
[facility] LCSW Group Therapy  08/31/2012  1:15 PM   Type of Therapy:  Group Therapy  Participation Level:  Active  Participation Quality:  Alert and Attentive  Affect:  Appropriate and Calm  Cognitive:  Alert and Appropriate  Insight:  Developing/Improving and Engaged  Engagement in Therapy:  Developing/Improving and Engaged  Modes of Intervention:  Activity, Clarification, Confrontation, Discussion, Education, Exploration, Limit-setting, Orientation, Problem-solving, Rapport Building, Dance movement psychotherapist, Socialization and Support  Summary of Progress/Problems: The topic for today was feelings about relapse.  Pt discussed what relapse prevention is to them and identified triggers that they are on the path to relapse.  Pt processed their feeling towards relapse and was able to relate to peers.  Pt discussed coping skills that can be used for relapse prevention.   Pt shared that her triggers for relapse are her mother, sister and husband at times.  Pt states that she has tried to set boundaries with them by saying "please leave me alone" but they don't which escalates her to yelling and screaming profanity at them.  Pt was able to relate this behavior to ending up here, but describes it as her mother's way of punishing her.  Pt states that she plans to have less contact with her family when she goes home.  Pt actively participated in group discussion today.    [name], LCSWA 08/31/2012 2:13 PM

## 2012-08-31 NOTE — [facility] Group Notes (Signed)
[facility] LCSW Aftercare Discharge Planning Group Note   08/31/2012 8:45 AM  Participation Quality:  Alert and Appropriate   Mood/Affect:  Appropriate but Flat  Depression Rating:  0  Anxiety Rating:  0  Thoughts of Suicide:  Pt denies SI/HI  Will you contract for safety?   Yes  Current AVH:  Pt denies AVH  Plan for Discharge/Comments:  Pt attended discharge planning group and actively participated in group.  CSW provided pt with today's workbook.  Pt states that she is sad today about her treatment but is not depressed.  Pt states that she will return home in [place].  Pt is on the [facility] waiting list. No further needs voiced by pt at this time.    Transportation Means: Pt reports having access to transportation  Supports: No supports mentioned at this time  [name], LCSWA 08/31/2012 9:58 AM

## 2012-08-31 NOTE — Tx Team (Signed)
Interdisciplinary Treatment Plan Update (Adult)  Date: 08/31/2012  Time Reviewed:  9:45 AM  Progress in Treatment: Attending groups: Yes Participating in groups:  Yes Taking medication as prescribed:  Yes Tolerating medication:  Yes Family/Significant othe contact made: Pt refused  Patient understands diagnosis:  Yes Discussing patient identified problems/goals with staff:  Yes Medical problems stabilized or resolved:  Yes Denies suicidal/homicidal ideation: Yes Issues/concerns per patient self-inventory:  Yes Other:  New problem(s) identified: N/A  Discharge Plan or Barriers: CSW assessing for appropriate referrals.  [facility] referral made.    Reason for Continuation of Hospitalization: Anxiety Depression Medication Stabilization  Comments: N/A  Estimated length of stay: 4-5 days  For review of initial/current patient goals, please see plan of care.  Attendees: Patient:   08/31/2012 9:14 AM   Family:     Physician:  Dr. Thedore Mins 08/31/2012 9:14 AM   Nursing:   Nestor Ramp, RN 08/31/2012 9:14 AM   Clinical Social Worker:  [name], LCSWA 08/31/2012 9:14 AM   Other:  08/31/2012 9:14 AM   Other:     Other:     Other:     Other:    Other:    Other:    Other:    Other:    Other:     Scribe for Treatment Team:   Carmina Miller, 08/31/2012 9:14 AM

## 2012-08-31 NOTE — Progress Notes (Signed)
D: Patient denies SI/HI and auditory and visual hallucinations. The patient has a labile mood and affect. The patient refused to fill out her self-inventory form. After being informed about the forced medication order, the patient is willing to take her medications. After taking medications, the patient stated that she was having "an allergic reaction to the medications." Patient's vitals obtained (BP 97/68, HR 69). Patient exhibited no skin rash or redness. The patient is still slightly disorganized in her thinking and still reports that she "doesn't need to be here."   A: Patient given emotional support from RN. Patient encouraged to come to staff with concerns and/or questions. Patient's medication routine continued. Patient's orders and plan of care reviewed. MD notified of patient's concerns about medications ("they're unsafe" and that "they give me allergic reactions").  R: Patient remains safe. Will continue to monitor patient q15 minutes for safety.

## 2012-08-31 NOTE — Progress Notes (Signed)
Adult Psychoeducational Group Note  Date:  08/31/2012 Time:  2000  Group Topic/Focus:  Wrap-Up Group:   The focus of this group is to help patients review their daily goal of treatment and discuss progress on daily workbooks.  Participation Level:  Active  Participation Quality:  Appropriate  Affect:  Appropriate  Cognitive:  Appropriate  Insight: Good  Engagement in Group:  Engaged  Modes of Intervention:  Discussion  Additional Comments:  Pt described her day as being "pretty good". Pt shared that her parents came to visit her this evening.   Clark Cuff A 08/31/2012, 9:24 PM

## 2012-08-31 NOTE — Progress Notes (Signed)
Patient ID: Tina [name], female   DOB: 12/16/1970, 42 y.o.   MRN: [id number]  D: Pt denies SI/HI/AVH/pain. Pt is pleasant and cooperative. Pt appears coherent, pt has improved hygiene, pt overall attitude seems better. Pt says she will comply with therapy and  A: Pt was offered support and encouragement. Pt was given scheduled medications. Pt was encourage to attend groups. Q 15 minute checks were done for safety.  R:Pt attends groups and interacts well with peers and staff. Pt is taking medication. Pt has no complaints.Pt receptive to treatment and safety maintained on unit.

## 2012-08-31 NOTE — Progress Notes (Signed)
Psychoeducational Group Note  Date:  08/31/2012 Time:  2000  Group Topic/Focus:  Karaoke  Participation Level: Did Not Attend  Participation Quality:  Not Applicable  Affect:  Not Applicable  Cognitive:  Not Applicable  Insight:  Not Applicable  Engagement in Group: Not Applicable  Additional Comments:    Flonnie Hailstone 08/31/2012, 12:03 AM

## 2012-08-31 NOTE — Progress Notes (Signed)
Patient ID: Tina [name], female   DOB: [birth year], 42 y.o.   MRN: [id number] [facility] MD Progress Note  08/31/2012 5:06 PM Tina [name]  MRN:  [id number] Subjective: "I'm going to take oral medication, I don't want anymore shots.:" Objective: Patient is more cooperative today, denies side effects wit the medication and has agreed to take the increased dose of Lamictal and the 5mg  of Haldol at hs. Diagnosis:  Axis I: Bipolar 1 disorder-recent episode manic                                Cluster B traits  ADL's:  Intact  Sleep: fair  Appetite:  Fair  Suicidal Ideation:  Plan:  denies Intent:  denies Means:  denies Homicidal Ideation:  Plan:  denies Intent:  denies Means:  denies AEB (as evidenced by):  Psychiatric Specialty Exam: Review of Systems  Constitutional: Negative.  Negative for fever, chills, weight loss, malaise/fatigue and diaphoresis.  HENT: Negative.  Negative for congestion and sore throat.   Eyes: Negative.  Negative for blurred vision, double vision and photophobia.  Respiratory: Negative.  Negative for cough, shortness of breath and wheezing.   Cardiovascular: Positive for leg swelling. Negative for chest pain, palpitations and PND.  Gastrointestinal: Negative.  Negative for heartburn, nausea, vomiting, abdominal pain, diarrhea and constipation.  Genitourinary: Negative.   Musculoskeletal: Negative.  Negative for myalgias, joint pain and falls.  Skin: Negative.   Neurological: Negative.  Negative for dizziness, tingling, tremors, sensory change, speech change, focal weakness, seizures, loss of consciousness, weakness and headaches.  Endo/Heme/Allergies: Negative.  Negative for polydipsia. Does not bruise/bleed easily.  Psychiatric/Behavioral: Negative for depression, suicidal ideas, hallucinations, memory loss and substance abuse. The patient is nervous/anxious and has insomnia.     Blood pressure 88/59, pulse 92, temperature 97.4 F (36.3 C), temperature source  Oral, resp. rate 16, height 5' (1.524 m), weight 49.896 kg (110 lb), SpO2 99.00%.Body mass index is 21.48 kg/(m^2).  General Appearance: Fairly Groomed  Patent attorney::  Good  Speech:  Clear and goal directed/  Volume:  Increased  Mood:  Irritable  Affect:  Labile and Full Range  Thought Process:  Circumstantial  Orientation:  Full (Time, Place, and Person)  Thought Content:  NA  Suicidal Thoughts:  No  Homicidal Thoughts:  No  Memory:  Immediate;   Fair Recent;   Fair Remote;   Fair  Judgement:  Poor  Insight:  Lacking  Psychomotor Activity:  Increased  Concentration:  Fair  Recall:  Fair  Akathisia:  No  Handed:  Right  AIMS (if indicated):     Assets:  Physical Health Social Support  Sleep:  Number of Hours: 6.75   Current Medications: Current Facility-Administered Medications  Medication Dose Route Frequency Provider Last Rate Last Dose  . acetaminophen (TYLENOL) tablet 650 mg  650 mg Oral Q6H PRN Earney Navy, NP      . alum & mag hydroxide-simeth (MAALOX/MYLANTA) 200-200-20 MG/5ML suspension 30 mL  30 mL Oral Q4H PRN Earney Navy, NP      . ARIPiprazole (ABILIFY) tablet 5 mg  5 mg Oral QHS Mojeed Akintayo      . hydrOXYzine (ATARAX/VISTARIL) tablet 50 mg  50 mg Oral QHS PRN,MR X 1 Court Joy, PA-C      . lamoTRIgine (LAMICTAL) tablet 25 mg  25 mg Oral BID Mojeed Akintayo      . LORazepam (ATIVAN) injection  1 mg  1 mg Intramuscular Q6H PRN Earney Navy, NP      . LORazepam (ATIVAN) tablet 1 mg  1 mg Oral Q8H PRN Shuvon Rankin, NP      . magnesium hydroxide (MILK OF MAGNESIA) suspension 30 mL  30 mL Oral Daily PRN Shuvon Rankin, NP   30 mL at 08/30/12 1200    Lab Results:  No results found for this or any previous visit (from the past 48 hour(s)).  Physical Findings: AIMS: Facial and Oral Movements Muscles of Facial Expression: None, normal Lips and Perioral Area: None, normal Jaw: None, normal Tongue: None, normal,Extremity Movements Upper  (arms, wrists, hands, fingers): None, normal Lower (legs, knees, ankles, toes): None, normal, Trunk Movements Neck, shoulders, hips: None, normal, Overall Severity Severity of abnormal movements (highest score from questions above): None, normal Incapacitation due to abnormal movements: None, normal Patient's awareness of abnormal movements (rate only patient's report): No Awareness, Dental Status Current problems with teeth and/or dentures?: No Does patient usually wear dentures?: No  CIWA:  CIWA-Ar Total: 1 COWS:  COWS Total Score: 1  Treatment Plan Summary: Daily contact with patient to assess and evaluate symptoms and progress in treatment Medication management  Plan: 1. Admit for crisis management and stabilization. 2. Medication management to reduce current symptoms to base line and improve the     patient's overall level of functioning 3. Treat health problems as indicated. 4. Develop treatment plan to decrease risk of relapse upon discharge and the need for     readmission. 5. Psycho-social education regarding relapse prevention and self care. 6. Health care follow up as needed for medical problems. 7. Restart home medications where appropriate. 8. Increase Lamictal 50 mg po BID 9. Haldol 5mg  po at hs. Medical Decision Making Problem Points:  Established problem, worsening (1), Review of last therapy session (1) and Review of psycho-social stressors (1) Data Points:  Order Aims Assessment (2) Review of medication regiment & side effects (2) Review of new medications or change in dosage (2)  I certify that inpatient services furnished can reasonably be expected to improve the patient's condition.   Rona Ravens. Christina Gintz RPAC 5:06 PM 08/31/2012

## 2012-09-01 NOTE — [facility] Group Notes (Signed)
[facility] LCSW Group Therapy  09/01/2012   Type of Therapy:  Group Therapy 11:15 to 11:45 AM  Participation Level:  Active  Participation Quality:  Monopolizing  Affect:  Anxious  Cognitive:  Alert and Oriented  Insight:  Limited  Engagement in Therapy:  None, left group after being redirected from monopolizing  Modes of Intervention:  Discussion, Exploration, Rapport Building, Socialization and Support  Summary of Progress/Problems: Topic for group today was readiness for change and the different stages (Not Ready, Readiness, Action and Maintenace) were then discussed and described by group. [name] preferred to focus on the specifics of her admittance which was involuntary vs what she sees as her progress. Patient was invested in mistakes family has made regarding admission such as "bringing the law into the mix which was totally unnecessary." Once redirected she left soon thereafter.   Tina [name]

## 2012-09-01 NOTE — Progress Notes (Signed)
Patient ID: Tina [name], female   DOB: 02-22-1971, 42 y.o.   MRN: [id number] [facility] MD Progress Note  09/01/2012 5:04 PM Tina [name]  MRN:  [id number] Subjective: "I'm doing well, I slept well but I woke up puffy." Objective: Patient is more cooperative today, makes good eye contact and speech is clear and goal directed. She is asking about her thyroid medication. Diagnosis:  Axis I: Bipolar 1 disorder-recent episode manic                                Cluster B traits  ADL's:  Intact  Sleep: fair  Appetite:  Fair  Suicidal Ideation:  Plan:  denies Intent:  denies Means:  denies Homicidal Ideation:  Plan:  denies Intent:  denies Means:  denies AEB (as evidenced by):  Psychiatric Specialty Exam: Review of Systems  Constitutional: Negative.  Negative for fever, chills, weight loss, malaise/fatigue and diaphoresis.  HENT: Negative.  Negative for congestion and sore throat.   Eyes: Negative.  Negative for blurred vision, double vision and photophobia.  Respiratory: Negative.  Negative for cough, shortness of breath and wheezing.   Cardiovascular: Positive for leg swelling. Negative for chest pain, palpitations and PND.  Gastrointestinal: Negative.  Negative for heartburn, nausea, vomiting, abdominal pain, diarrhea and constipation.  Genitourinary: Negative.   Musculoskeletal: Negative.  Negative for myalgias, joint pain and falls.  Skin: Negative.   Neurological: Negative.  Negative for dizziness, tingling, tremors, sensory change, speech change, focal weakness, seizures, loss of consciousness, weakness and headaches.  Endo/Heme/Allergies: Negative.  Negative for polydipsia. Does not bruise/bleed easily.  Psychiatric/Behavioral: Negative for depression, suicidal ideas, hallucinations, memory loss and substance abuse. The patient is nervous/anxious and has insomnia.     Blood pressure 83/56, pulse 73, temperature 97.8 F (36.6 C), temperature source Oral, resp. rate 18, height  5' (1.524 m), weight 49.896 kg (110 lb), SpO2 99.00%.Body mass index is 21.48 kg/(m^2).  General Appearance: Fairly Groomed  Patent attorney::  Good  Speech:  Clear and goal directed/  Volume:  Increased  Mood:  Irritable  Affect:  Labile and Full Range  Thought Process:  Circumstantial  Orientation:  Full (Time, Place, and Person)  Thought Content:  NA  Suicidal Thoughts:  No  Homicidal Thoughts:  No  Memory:  Immediate;   Fair Recent;   Fair Remote;   Fair  Judgement:  Poor  Insight:  Lacking  Psychomotor Activity:  Increased  Concentration:  Fair  Recall:  Fair  Akathisia:  No  Handed:  Right  AIMS (if indicated):     Assets:  Physical Health Social Support  Sleep:  Number of Hours: 6.75   Current Medications: Current Facility-Administered Medications  Medication Dose Route Frequency Provider Last Rate Last Dose  . acetaminophen (TYLENOL) tablet 650 mg  650 mg Oral Q6H PRN Earney Navy, NP      . alum & mag hydroxide-simeth (MAALOX/MYLANTA) 200-200-20 MG/5ML suspension 30 mL  30 mL Oral Q4H PRN Earney Navy, NP      . ARIPiprazole (ABILIFY) tablet 5 mg  5 mg Oral QHS Mojeed Akintayo      . hydrOXYzine (ATARAX/VISTARIL) tablet 50 mg  50 mg Oral QHS PRN,MR X 1 Court Joy, PA-C      . lamoTRIgine (LAMICTAL) tablet 25 mg  25 mg Oral BID Mojeed Akintayo      . LORazepam (ATIVAN) injection 1 mg  1 mg  Intramuscular Q6H PRN Earney Navy, NP      . LORazepam (ATIVAN) tablet 1 mg  1 mg Oral Q8H PRN Shuvon Rankin, NP      . magnesium hydroxide (MILK OF MAGNESIA) suspension 30 mL  30 mL Oral Daily PRN Shuvon Rankin, NP   30 mL at 08/30/12 1200    Lab Results:  Reviewed Thyroid results.   Physical Findings: AIMS: Facial and Oral Movements Muscles of Facial Expression: None, normal Lips and Perioral Area: None, normal Jaw: None, normal Tongue: None, normal,Extremity Movements Upper (arms, wrists, hands, fingers): None, normal Lower (legs, knees, ankles,  toes): None, normal, Trunk Movements Neck, shoulders, hips: None, normal, Overall Severity Severity of abnormal movements (highest score from questions above): None, normal Incapacitation due to abnormal movements: None, normal Patient's awareness of abnormal movements (rate only patient's report): No Awareness, Dental Status Current problems with teeth and/or dentures?: No Does patient usually wear dentures?: No  CIWA:  CIWA-Ar Total: 1 COWS:  COWS Total Score: 1  Treatment Plan Summary: Daily contact with patient to assess and evaluate symptoms and progress in treatment Medication management  Plan: 1. Admit for crisis management and stabilization. 2. Medication management to reduce current symptoms to base line and improve the patient's overall level of functioning 3. Treat health problems as indicated. 4. Develop treatment plan to decrease risk of relapse upon discharge and the need for readmission. 5. Psycho-social education regarding relapse prevention and self care. 6. Health care follow up as needed for medical problems. 7. Restart home medications where appropriate. 8. Increase Lamictal 50 mg po BID 9. Haldol 5mg  po at hs. Medical Decision Making Problem Points:  Established problem, worsening (1), Review of last therapy session (1) and Review of psycho-social stressors (1) Data Points:  Order Aims Assessment (2) Review of medication regiment & side effects (2) Review of new medications or change in dosage (2)  I certify that inpatient services furnished can reasonably be expected to improve the patient's condition.   Rona Ravens. Karalina Tift RPAC 5:04 PM 09/01/2012

## 2012-09-01 NOTE — Progress Notes (Signed)
Pt states she slept well on the Haldol, but she feels puffy. "I don't like the way it makes me feel, I feel swollen, and don't feel right, isn't there anything else i can take, I know I was on trazodone some time before". Pt informed to discuss this feeling with the Dr.

## 2012-09-01 NOTE — Progress Notes (Signed)
Reviewed the information documented and agree with the treatment plan.  Ulysses Alper,JANARDHAHA R. 09/01/2012 6:59 PM

## 2012-09-01 NOTE — Progress Notes (Signed)
Adult Psychoeducational Group Note  Date:  09/01/2012 Time:  3:15PM  Group Topic/Focus:  Self Care:   The focus of this group is to help patients understand the importance of self-care in order to improve or restore emotional, physical, spiritual, interpersonal, and financial health.  Participation Level:  Active  Participation Quality:  Appropriate  Affect:  Appropriate  Cognitive:  Appropriate  Insight: Appropriate  Engagement in Group:  Engaged  Modes of Intervention:  Discussion  Additional Comments:  Pt participated in group discussion and was active throughout group   Nicolaus Andel K 09/01/2012, 3:53 PM

## 2012-09-01 NOTE — Progress Notes (Signed)
[facility] Group Notes:  (Nursing/MHT/Case Management/Adjunct)  Date:  09/01/2012  Time:  2000  Type of Therapy:  Psychoeducational Skills  Participation Level:  Active  Participation Quality:  Appropriate  Affect:  Appropriate  Cognitive:  Appropriate  Insight:  Appropriate  Engagement in Group:  Engaged  Modes of Intervention:  Education  Summary of Progress/Problems: The patient shared with the group that she is unclear about her discharge plans. In addition, she mentioned that their are issues with her housing following discharge from the hospital. She described her day as having been a "great day". For one, she stated that she had a good family visit. Secondly, she shared with the group that her children are doing well and that they are headed to the beach. Her goal for tomorrow is to speak with the case manager regarding her discharge plans.   Hazle Coca S 09/01/2012, 9:23 PM

## 2012-09-01 NOTE — Progress Notes (Signed)
Patient ID: Tina [name], female   DOB: 1970/12/11, 42 y.o.   MRN: [id number] Psychoeducational Group Note  Date:  09/01/2012 Time:1000am  Group Topic/Focus:  Identifying Needs:   The focus of this group is to help patients identify their personal needs that have been historically problematic and identify healthy behaviors to address their needs.  Participation Level:  Active  Participation Quality:  Appropriate  Affect:  Appropriate  Cognitive:  Appropriate  Insight:  Supportive  Engagement in Group:  Supportive  Additional Comments:  Inventory; and Psychoeducational group   Valente David 09/01/2012,10:36 AM

## 2012-09-01 NOTE — Progress Notes (Signed)
Patient ID: Tina [name], female   DOB: 05-22-1970, 42 y.o.   MRN: [id number] 09-01-12 @ [phone] nursing shift note: D: pt continues be resistant to take medications, but is taking them. A: staff continues to support and encourage. R: she had no complaints of pain. She is complaining of water retention from her medications. On her inventory sheet she wrote slept well, appetite good, energy normal with her depression and hopelessness both at "0". She denies any si/hi/av. After discharge she plans to" find a home, job and take care of my children".  She is wanting to discharge today. RN will monitor and Q 15 min ck's continue.

## 2012-09-02 MED ORDER — LAMOTRIGINE 25 MG PO TABS: ORAL_TABLET | ORAL | Status: DC

## 2012-09-02 MED ORDER — HALOPERIDOL 5 MG PO TABS: 5.0000 mg | ORAL_TABLET | Freq: Every day | ORAL | Status: DC

## 2012-09-02 NOTE — [facility] Group Notes (Signed)
[facility] LCSW Group Therapy  09/02/2012   11:00 AM   Type of Therapy:  Group Therapy  Participation Level:  Active  Participation Quality:  Appropriate and Attentive  Affect:  Appropriate but Flat  Cognitive:  Alert and Appropriate  Insight:  Developing/Improving and Engaged  Engagement in Therapy:  Developing/Improving and Engaged  Modes of Intervention:  Clarification, Confrontation, Discussion, Education, Exploration, Limit-setting, Orientation, Problem-solving, Rapport Building, Dance movement psychotherapist, Socialization and Support  Summary of Progress/Problems: The main focus of today's process group was to identify the patient's current support system and decide on other supports that can be put in place.  An emphasis was placed on using counselor, doctor, therapy groups, 12-step groups, and problem-specific support groups to expand supports, as well as doing something different than has been done before.  Pt also discussed positive supports versus negative supports, and the importance of knowing the difference.  Pt shared that she has received positive support from other patients here, as they are able to relate to one another.  Pt was able to process the issues with her family and how she feels that they think they are being supportive to her but pt feels they are toxic to her.  Pt actively participate in group discussion.    [name], [place] 09/02/2012 12:26 PM

## 2012-09-02 NOTE — Progress Notes (Signed)
Patient ID: Tina [name], female   DOB: October 13, 1970, 42 y.o.   MRN: [id number] [facility] MD Progress Note  09/02/2012 8:40 PM Tina [name]  MRN:  [id number] Subjective: "I'm having very wild dreams, weird, but not nightmares." Objective: Patient continues to request to discontinue the antipsychotic medication, does not show any insight into her illness. She is made aware that she is doing well and anticipating a discharge on Monday at the latest Tuesday as her house closes on the 26th.  She states she can return home or to her father's house. Diagnosis:  Axis I: Bipolar 1 disorder-recent episode manic                                Cluster B traits  ADL's:  Intact  Sleep: fair  Appetite:  Fair  Suicidal Ideation:  Plan:  denies Intent:  denies Means:  denies Homicidal Ideation:  Plan:  denies Intent:  denies Means:  denies AEB (as evidenced by):  Psychiatric Specialty Exam: Review of Systems  Constitutional: Negative.  Negative for fever, chills, weight loss, malaise/fatigue and diaphoresis.  HENT: Negative.  Negative for congestion and sore throat.   Eyes: Negative.  Negative for blurred vision, double vision and photophobia.  Respiratory: Negative.  Negative for cough, shortness of breath and wheezing.   Cardiovascular: Positive for leg swelling. Negative for chest pain, palpitations and PND.  Gastrointestinal: Negative.  Negative for heartburn, nausea, vomiting, abdominal pain, diarrhea and constipation.  Genitourinary: Negative.   Musculoskeletal: Negative.  Negative for myalgias, joint pain and falls.  Skin: Negative.   Neurological: Negative.  Negative for dizziness, tingling, tremors, sensory change, speech change, focal weakness, seizures, loss of consciousness, weakness and headaches.  Endo/Heme/Allergies: Negative.  Negative for polydipsia. Does not bruise/bleed easily.  Psychiatric/Behavioral: Negative for depression, suicidal ideas, hallucinations, memory loss and  substance abuse. The patient is nervous/anxious and has insomnia.     Blood pressure 92/59, pulse 71, temperature 99 F (37.2 C), temperature source Oral, resp. rate 16, height 5' (1.524 m), weight 49.896 kg (110 lb), SpO2 99.00%.Body mass index is 21.48 kg/(m^2).  General Appearance: Fairly Groomed  Patent attorney::  Good  Speech:  Clear and goal directed/  Volume:  Increased  Mood:  euthymic  Affect: pleasant and cooperative  Thought Process:  Circumstantial  Orientation:  Full (Time, Place, and Person)  Thought Content:  NA  Suicidal Thoughts:  No  Homicidal Thoughts:  No  Memory:  Immediate;   Fair Recent;   Fair Remote;   Fair  Judgement:  Poor  Insight:  Lacking  Psychomotor Activity:  Increased  Concentration:  Fair  Recall:  Fair  Akathisia:  No  Handed:  Right  AIMS (if indicated):     Assets:  Physical Health Social Support  Sleep:  Number of Hours: 6.75   Current Medications: Current Facility-Administered Medications  Medication Dose Route Frequency Provider Last Rate Last Dose  . acetaminophen (TYLENOL) tablet 650 mg  650 mg Oral Q6H PRN Earney Navy, NP      . alum & mag hydroxide-simeth (MAALOX/MYLANTA) 200-200-20 MG/5ML suspension 30 mL  30 mL Oral Q4H PRN Earney Navy, NP      . ARIPiprazole (ABILIFY) tablet 5 mg  5 mg Oral QHS Mojeed Akintayo      . hydrOXYzine (ATARAX/VISTARIL) tablet 50 mg  50 mg Oral QHS PRN,MR X 1 Court Joy, PA-C      .  lamoTRIgine (LAMICTAL) tablet 25 mg  25 mg Oral BID Mojeed Akintayo      . LORazepam (ATIVAN) injection 1 mg  1 mg Intramuscular Q6H PRN Earney Navy, NP      . LORazepam (ATIVAN) tablet 1 mg  1 mg Oral Q8H PRN Shuvon Rankin, NP      . magnesium hydroxide (MILK OF MAGNESIA) suspension 30 mL  30 mL Oral Daily PRN Shuvon Rankin, NP   30 mL at 08/30/12 1200    Lab Results:  Reviewed Thyroid results.   Physical Findings: AIMS: Facial and Oral Movements Muscles of Facial Expression: None,  normal Lips and Perioral Area: None, normal Jaw: None, normal Tongue: None, normal,Extremity Movements Upper (arms, wrists, hands, fingers): None, normal Lower (legs, knees, ankles, toes): None, normal, Trunk Movements Neck, shoulders, hips: None, normal, Overall Severity Severity of abnormal movements (highest score from questions above): None, normal Incapacitation due to abnormal movements: None, normal Patient's awareness of abnormal movements (rate only patient's report): No Awareness, Dental Status Current problems with teeth and/or dentures?: No Does patient usually wear dentures?: No  CIWA:  CIWA-Ar Total: 1 COWS:  COWS Total Score: 1  Treatment Plan Summary: Daily contact with patient to assess and evaluate symptoms and progress in treatment Medication management  Plan: 1. Continue crisis management and stabilization. 2. Medication management to reduce current symptoms to base line and improve the patient's overall level of functioning 3. Treat health problems as indicated. 4. Develop treatment plan to decrease risk of relapse upon discharge and the need for readmission. 5. Psycho-social education regarding relapse prevention and self care. 6. Health care follow up as needed for medical problems. 7. Restart home medications where appropriate. 8. Continue Lamictal 50 mg po BID 9. Haldol 5mg  po at hs. 10. Patient informed about her thyroid panel.  No changes at this time. 11. Patient is much improved, plan for D/C in AM pending MD approval. 12. As she does not feel that she needs this medication and is not invested in further treatment at this time and no longer meets the requirement for continued hospitalization, d/c in AM is recommended.  It is very likely that she will require readmission in the future. Medical Decision Making Problem Points:  Established problem, worsening (1), Review of last therapy session (1) and Review of psycho-social stressors (1) Data Points:   Order Aims Assessment (2) Review of medication regiment & side effects (2) Review of new medications or change in dosage (2)  I certify that inpatient services furnished can reasonably be expected to improve the patient's condition.   Rona Ravens. Aiyonna Lucado RPAC 8:40 PM 09/02/2012

## 2012-09-02 NOTE — Progress Notes (Signed)
Patient ID: Tina [name], female   DOB: 1970-08-06, 42 y.o.   MRN: [id number] D. The patient is pleasant and interacting in the milieu. Stated that she enjoyed going outside today and was happy that her children were going to the the beach with her husband and her parents. However, she was concerned about her discharge date, stating that she has a house closing next Thursday and she needs to be there because her signature is needed on the papers.  A. Met with patient 1:1. Encouraged to attend evening wrap up group. Given verbal support. Reviewed and explained need for medication. Administered P.O. Haldol. R. The patient attended and actively participated in evening group. Initially refused HS Haldol stating it made her face puffy. Medication reviewed with P.A. on call. Forced medication order explained to the Pt. After initially refusing medication, was compliant with taking it orally. Will continue to monitor.

## 2012-09-02 NOTE — Progress Notes (Signed)
Patient ID: Tina [name], female   DOB: 12-Jan-1971, 42 y.o.   MRN: [id number] D: Pt is awake and active on the unit this AM. Pt denies SI/HI and A/V hallucinations. Pt rates their depression at 2 and hopelessness at 0. Pt's mood is anxious and her affect is anxious/preoccupied. Pt writes that she plans to "take care of business" after discharge. She also wants a "clear discharge date because her house is closing on June 26, and she needs to find a place to live. Pt is preoccupied with discharge and believes that the MD told her she could go home. Pt is redirectable and is cooperative with staff. Pt is attending groups as well.  A: Encouraged pt to discuss feelings with staff and administered medication per MD orders. Writer also encouraged pt to participate in groups.  R: Pt is attending groups and tolerating medications well. Writer will continue to monitor. 15 minute checks are ongoing for safety.

## 2012-09-02 NOTE — [facility] Group Notes (Signed)
Adult Psychoeducational Group Note  Date:  09/02/2012 Time:  12:58 PM  Group Topic/Focus:  Healthy Communication:   The focus of this group is to discuss communication, barriers to communication, as well as healthy ways to communicate with others.  Participation Level:  Active  Participation Quality:  Appropriate  Affect:  Angry  Cognitive:  Alert  Insight: Appropriate  Engagement in Group:  Engaged  Modes of Intervention:  Discussion  Additional Comments: Pt was very concerned about being put on Haldol and does not like the way it makes her feel. Pt discussed her toxic relationship with her mom and how she would prefer not to have contact with her. Pt stated that her mom never takes ownership for any wrongdoings and all of this could have been avoided meaning committing her to [facility]. Pt stated she has a lot of family matters to deal with and by being here nothing is getting done. She does appear very anxious as she did discuss with staff in regards to the whole situation.   Rodman Key [facility] 09/02/2012, 12:58 PM

## 2012-09-02 NOTE — Progress Notes (Signed)
Pt is calm and appropriate tonight. She is quiet, sleepy and asking for hs meds so that she can go onto bed. No outbursts or somatic complaints. Did attend evening wrap up group, was medicated without difficulty. Support offered. Denies SI/HI/AVH and remains safe. States she is looking to discharge tomorrow or possibly Tuesday. Tina [name]

## 2012-09-03 MED ORDER — LAMOTRIGINE 100 MG PO TABS: ORAL_TABLET | ORAL | Status: DC

## 2012-09-03 NOTE — Progress Notes (Signed)
[facility] Adult Case Management Discharge Plan :  Will you be returning to the same living situation after discharge: Yes,  returning home At discharge, do you have transportation home?:Yes,  father will pick pt up Do you have the ability to pay for your medications:Yes,  access to meds  Release of information consent forms completed and in the chart;  Patient's signature needed at discharge.  Patient to Follow up at: Follow-up Information   Schedule an appointment as soon as possible for a visit with [facility]. (Office was closed at the time CSW tried to schedule the appointment.  CSW will contact you once scheduled.  )    Contact information:   [address], [place], [place] [address] Phone: [phone] FaX: [phone]      Schedule an appointment as soon as possible for a visit with [facility]. (Schedule appointment for therapy with Arbutus Ped)    Contact information:   [address], [address] [place], [place] [address]  Phone: [phone] Fax: [phone]      Patient denies SI/HI:   Yes,  denies SI/HI    Safety Planning and Suicide Prevention discussed:  Yes,  discussed with pt, pt refused contact with family.  See suicide prevention note.  Pt was unable to verbalize a d/c plan until today so CSW was unable to secure pt's follow up until she agreed to the above providers today.  Pt states that she's seen both of them in the past and is active with both providers.  Pt did not want a therapy appointment scheduled by CSW at this time but agreed to do so later and have records sent to [facility].  CSW attempted to schedule pt's follow up with [facility] but the office is closed today.  CSW will schedule this appointment and call pt with the appointment as soon as possible.    Carmina Miller 09/03/2012, 11:09 AM

## 2012-09-03 NOTE — Progress Notes (Signed)
Seen and agreed. Lexy Meininger, MD 

## 2012-09-03 NOTE — [facility] Suicide Risk Assessment (Signed)
Suicide Risk Assessment  Discharge Assessment     Demographic Factors:  Unemployed and female  Mental Status Per Nursing Assessment::   On Admission:     Current Mental Status by Physician: patient denies suicidal ideation, intent or plan  Loss Factors: Decrease in vocational status  Historical Factors: Impulsivity  Risk Reduction Factors:   Sense of responsibility to family, Living with another person, especially a relative and Positive social support  Continued Clinical Symptoms:  Bipolar Disorder:   Bipolar II  Cognitive Features That Contribute To Risk:  Closed-mindedness Polarized thinking    Suicide Risk:  Minimal: No identifiable suicidal ideation.  Patients presenting with no risk factors but with morbid ruminations; may be classified as minimal risk based on the severity of the depressive symptoms  Discharge Diagnoses:   AXIS I:  Bipolar I disorder, most recent episode (or current) manic  AXIS II:  Deferred AXIS III:   Past Medical History  Diagnosis Date  . History of chicken pox   . Hypothyroidism   . Genital warts   . Depression   . Bipolar 2 disorder    AXIS IV:  other psychosocial or environmental problems and problems related to social environment AXIS V:  61-70 mild symptoms  Plan Of Care/Follow-up recommendations:  Activity:  as tolerated Diet:  healthy Tests:  routine Other:  patient denies suicidal ideation, intent or plan  Is patient on multiple antipsychotic therapies at discharge:  No   Has Patient had three or more failed trials of antipsychotic monotherapy by history:  No  Recommended Plan for Multiple Antipsychotic Therapies: N/A  Gael Londo,MD 09/03/2012, 8:56 AM

## 2012-09-03 NOTE — Discharge Summary (Signed)
Physician Discharge Summary Note  Patient:  Tina [name] is an 42 y.o., female MRN:  [id number] DOB:  08/02/1970 Patient phone:  [phone] (home)  Patient address:   [address] [place] [address],   Date of Admission:  08/25/2012 Date of Discharge: 09/03/2012  Reason for Admission: Bipolar disorder most recent episode manic Discharge Diagnoses: Principal Problem:   Bipolar I disorder, most recent episode (or current) manic  Review of Systems  Constitutional: Negative.  Negative for fever, chills, weight loss, malaise/fatigue and diaphoresis.  HENT: Negative for congestion and sore throat.   Eyes: Negative for blurred vision, double vision and photophobia.  Respiratory: Negative for cough, shortness of breath and wheezing.   Cardiovascular: Negative for chest pain, palpitations and PND.  Gastrointestinal: Negative for heartburn, nausea, vomiting, abdominal pain, diarrhea and constipation.  Musculoskeletal: Negative for myalgias, joint pain and falls.  Neurological: Negative for dizziness, tingling, tremors, sensory change, speech change, focal weakness, seizures, loss of consciousness, weakness and headaches.  Endo/Heme/Allergies: Negative for polydipsia. Does not bruise/bleed easily.  Psychiatric/Behavioral: Negative for depression, suicidal ideas, hallucinations, memory loss and substance abuse. The patient is not nervous/anxious and does not have insomnia.   Discharge Diagnoses:  AXIS I: Bipolar I disorder, most recent episode (or current) manic  AXIS II: Deferred  AXIS III:  Past Medical History   Diagnosis  Date   .  History of chicken pox    .  Hypothyroidism    .  Genital warts    .  Depression    .  Bipolar 2 disorder     AXIS IV: other psychosocial or environmental problems and problems related to social environment  AXIS V: 61-70 mild symptoms  Level of Care:  OP   Hospital Course:        Tina [name] is a 42 year old white female who was involuntarily  committed and accepted on transfer from the hospital where she presented accompanied by law enforcement after her family had taken out commitment papers on her. Tina [name] stated that she thought she was brought to the hospital because her family felt she needed to be back on her medications for her bipolar disorder. She had been off of her medication since April. She has had multiple psychiatric admissions he reports a least one suicide attempt in 2012. She denied suicidal or homicidal ideation or auditory or visual hallucinations. She did endorse paranoid delusions feeling that her family was trying to harm her and noted multiple stressors, including putting her house on the market and finding a place for her and her 3 children to live.      She was given medical clearance and transferred to behavioral health hospital for further stabilization. Her labs were unremarkable, but her UDS was positive for marijuana. Tina [name] was found to be having irritability, grandiosity, pressured speech, delusional, oppositional and was resistant to medication. She refused to take anything other than Topamax and a second opinion was completed.       She was given a 5 mg injection of Haldol, Benadryl 50 mg, and Ativan 2 mg. After that [facility] slept for several hours, and stated that she would prefer to take oral medication and would do so without difficulty or opposition. She was started on how tall 5 mg at at bedtime and her lamotrigine was increased to 100 mg.  [name] became less agitated and less irritable her speech became less pressured but she remained resistant to medication however she was compliant. She noted multiple somatic issues which  she attributed to the Haldol all of which were evaluated and found to be unlikely side effects.       By the day of discharge Tina [name] denied suicidal or homicidal ideation, denied auditory or visual hallucinations. She was fully oriented and goal-directed, however she continued to voice that she did  not need the medication and would likely not continue it upon discharge. She was felt to be stable for discharge followup plans as noted below.  Consults:  None  Significant Diagnostic Studies:  None  Discharge Vitals:   Blood pressure 91/62, pulse 78, temperature 98.3 F (36.8 C), temperature source Oral, resp. rate 16, height 5' (1.524 m), weight 49.896 kg (110 lb), SpO2 99.00%. Body mass index is 21.48 kg/(m^2). Lab Results:   No results found for this or any previous visit (from the past 72 hour(s)).  Physical Findings: AIMS: Facial and Oral Movements Muscles of Facial Expression: None, normal Lips and Perioral Area: None, normal Jaw: None, normal Tongue: None, normal,Extremity Movements Upper (arms, wrists, hands, fingers): None, normal Lower (legs, knees, ankles, toes): None, normal, Trunk Movements Neck, shoulders, hips: None, normal, Overall Severity Severity of abnormal movements (highest score from questions above): None, normal Incapacitation due to abnormal movements: None, normal Patient's awareness of abnormal movements (rate only patient's report): No Awareness, Dental Status Current problems with teeth and/or dentures?: No Does patient usually wear dentures?: No  CIWA:  CIWA-Ar Total: 1 COWS:  COWS Total Score: 1  Psychiatric Specialty Exam: See Psychiatric Specialty Exam and Suicide Risk Assessment completed by Attending Physician prior to discharge.  Discharge destination:  Home  Is patient on multiple antipsychotic therapies at discharge:  No   Has Patient had three or more failed trials of antipsychotic monotherapy by history:  No  Recommended Plan for Multiple Antipsychotic Therapies: Not applicable  Discharge Orders   Future Orders Complete By Expires     [facility] pharmacy consult for medication samples  As directed     Scheduling Instructions:      3 days of samples please.    Diet - low sodium heart healthy  As directed     Discharge instructions  As  directed     Comments:      Take all of your medications as directed. Be sure to keep all of your follow up appointments.  If you are unable to keep your follow up appointment, call your Doctor's office to let them know, and reschedule.  Make sure that you have enough medication to last until your appointment. Be sure to get plenty of rest. Going to bed at the same time each night will help. Try to avoid sleeping during the day.  Increase your activity as tolerated. Regular exercise will help you to sleep better and improve your mental health. Eating a heart healthy diet is recommended. Try to avoid salty or fried foods. Be sure to avoid all alcohol and illegal drugs.    Increase activity slowly  As directed         Medication List    TAKE these medications     Indication   haloperidol 5 MG tablet  Commonly known as:  HALDOL  Take 1 tablet (5 mg total) by mouth at bedtime.   Indication:  Bipolar disorder     lamoTRIgine 100 MG tablet  Commonly known as:  LAMICTAL  Take 1/2 tablet by mouth twice a day.   Indication:  Manic-Depression           Follow-up Information  Schedule an appointment as soon as possible for a visit with [facility]. (Office was closed at the time CSW tried to schedule the appointment.  CSW will contact you once scheduled.  )    Contact information:   [address], [place], [place] [address] Phone: [phone] FaX: [phone]      Schedule an appointment as soon as possible for a visit with [facility]. (Schedule appointment for therapy with Arbutus Ped)    Contact information:   [address], [address] [place], [place] [address]  Phone: [phone] Fax: [phone]      Follow-up recommendations:   Activities: Resume activity as tolerated. Diet: Heart healthy low sodium diet Tests: Follow up testing will be determined by your out patient provider. Comments:  It is likely that Tina [name] will need  readmission in the future as she has a fixed delusion that she does not need medication for her bipolar disorder.   otal Discharge Time:  Greater than 30 minutes.  Signed: Rona Ravens. Krystian Younglove RPAC 11:24 AM 09/09/2012

## 2012-09-03 NOTE — Progress Notes (Signed)
Patient ID: Tina [name], female   DOB: 01-22-1971, 42 y.o.   MRN: [id number] Patient to be discharged home today per MD order.  Patient's father will be picking her up after lunch.  Patient denies any depression/SI/HI/AVH.  Her mood is neutral/stable.  She has been in the day room interacting well with others.  She is attending groups and participating in her treatment.  Patient did not have any complaints this am.    Initiate discharge paperwork for patient to sign.  Patient is due medication samples from pharmacy.  Safety checks continued every 15 minutes per protocol.  Patient is receptive to staff and others; her behavior is appropriate.

## 2012-09-03 NOTE — BHH Group Notes (Signed)
[facility] LCSW Aftercare Discharge Planning Group Note   09/03/2012 8:45 AM  Participation Quality:  Alert and Appropriate   Mood/Affect:  Appropriate  Depression Rating:  0  Anxiety Rating:  0  Thoughts of Suicide:  Pt denies SI/HI  Will you contract for safety?   Yes  Current AVH:  Pt denies AVH  Plan for Discharge/Comments:  Pt attended discharge planning group and actively participated in group.  CSW provided pt with today's workbook.  Pt is happy that she is discharging today.  Pt states that she will be staying in [place].  Pt states that she follows up with NP at [facility] and a therapist at [facility].  CSW will schedule these appointments.  No further needs voiced by pt at this time.    Transportation Means: Pt states that her dad will pick her up today  Supports: No supports mentioned at this time  [name], LCSWA 09/03/2012 9:31 AM

## 2012-09-03 NOTE — Progress Notes (Signed)
Patient ID: Tina [name], female   DOB: 21-Aug-1970, 42 y.o.   MRN: [id number] Patient discharged home.  Patient received all discharge instructions, prescriptions and medication samples.  She denies any SI/HI/AVH.  Reviewed medications with patient and she indicated understanding.  She left ambulatory with her father without incident.

## 2012-09-03 NOTE — Progress Notes (Signed)
Adult Psychoeducational Group Note  Date:  09/02/2012 Time:  800 pm  Group Topic/Focus:  Wrap-Up Group:   The focus of this group is to help patients review their daily goal of treatment and discuss progress on daily workbooks.  Participation Level:  Active  Participation Quality:  Appropriate  Affect:  Appropriate  Cognitive:  Appropriate  Insight: Appropriate  Engagement in Group:  Engaged  Modes of Intervention:  Discussion  Additional Comments:  Pt reported that she had a good day.  Pt reported that although she was tired throughout the day and ready for bed at the present time that she attended all groups.  Pt participated and was able to provide input on what she has learned on fall risks and how to remain safe and avoid falls in various situations.  Tina [name] A 09/03/2012, 5:27 AM

## 2012-09-03 NOTE — Tx Team (Signed)
Interdisciplinary Treatment Plan Update (Adult)  Date: 09/03/2012  Time Reviewed:  9:45 AM  Progress in Treatment: Attending groups: Yes Participating in groups:  Yes Taking medication as prescribed:  Yes Tolerating medication:  Yes Family/Significant othe contact made: No Patient understands diagnosis:  Yes Discussing patient identified problems/goals with staff:  Yes Medical problems stabilized or resolved:  Yes Denies suicidal/homicidal ideation: Yes Issues/concerns per patient self-inventory:  Yes Other:  New problem(s) identified: N/A  Discharge Plan or Barriers: Pt states that she sees NP at [facility] and [facility] for medication management and therapy.  CSW will schedule these appointments prior to d/c today.    Reason for Continuation of Hospitalization: Stable to d/c today  Comments: N/A  Estimated length of stay: D/C today  For review of initial/current patient goals, please see plan of care.  Attendees: Patient:   09/03/2012 9:38 AM   Family:     Physician:  Dr. Thedore Mins 09/03/2012 9:38 AM   Nursing:   Celso Sickle, RN 09/03/2012 9:38 AM   Clinical Social Worker:  [name], LCSWA 09/03/2012 9:38 AM   Other: Liborio Nixon, RN 09/03/2012 9:38 AM   Other:  Verne Spurr, PA 09/03/2012 9:39 AM   Other:     Other:     Other:    Other:    Other:    Other:    Other:    Other:     Scribe for Treatment Team:   Carmina Miller, 09/03/2012 9:38 AM

## 2012-09-03 NOTE — Progress Notes (Signed)
Recreation Therapy Notes  Date: 06.23.2014        Time: 9:30am Location: [address] Dayroom      Group Topic/Focus: Self Expression  Participation Level: Active  Participation Quality: Appropriate  Affect: Euthymic  Cognitive: Appropriate   Additional Comments: Activity: Colors within Me; Explanation: Patients were asked to assign a color to a specific emotion. Using the colors they identified there were asked to color a blank face to represent the emotions most present in them. Classical music was played to enhance therapeutic environment.  Patient actively participated in group activity. Patient drew a facial features on her worksheet and shaded the worksheet in various colors. Patient interacted appropriately with peers. At approximately 9:55am patient excused herself from group. Patient did not return to group session.   Marykay Lex Tahir Blank, LRT/CTRS  Jazlynn Nemetz L 09/03/2012 1:03 PM

## 2012-09-04 NOTE — H&P (Signed)
Psychiatric Admission Assessment Adult  Psychiatric Admission Assessment Adult  08/26/2012 10:15 AM   Tina [name]  MRN: [id number]   Subjective: Tina [name] is a 42 year old white female who is an involuntary transfer from the emergency department where she presented accompanied by law enforcement after her husband had taken out commitment papers. She reports that she is here because her family thinks that she needs to be back on her psychiatric medications. She is a patient of Dr. Madie Reno [facility], and was taking Lomotil and Prozac, as well as Topamax until she weaned herself off. She has had no medication since April of this year. She has had multiple psychiatric hospitalizations, and has had at least one suicide attempt, the last one being in 2012 where she tried to cut her wrist. She denies any current suicidal or homicidal ideation. She denies any auditory or visual hallucinations. She does endorse paranoid delusions of her family trying to harm her and states "that's why I'm here."  Chistina endorses multiple recent stressors including being separated from her husband of 15 years 44 months, being a single mom of 3 children, recently preparing her house for sale and facilitating that sale, and looking for a place for her and her children to live.   Diagnosis:  Axis I: Bipolar, Manic  Axis II: Deferred  Axis III:  Past Medical History   Diagnosis  Date   .  History of chicken pox    .  Hypothyroidism    .  Genital warts    .  Depression    .  Bipolar 2 disorder    ADL's: Intact  Sleep: Poor  Appetite: Good  Suicidal Ideation:  Patient denies any thought, plan, or intent  Homicidal Ideation:  Patient denies any thought, plan, or intent  AEB (as evidenced by):   Physical Exam: As noted below  ROS As noted below   Psychiatric Specialty Exam:  Review of Systems  Constitutional: Negative.  HENT: Negative.  Eyes: Negative.  Respiratory: Negative.  Cardiovascular: Negative.   Gastrointestinal: Negative.  Genitourinary: Negative.  Musculoskeletal: Negative.  Skin: Negative.  Neurological: Negative.  Endo/Heme/Allergies: Negative.  Psychiatric/Behavioral: The patient has insomnia.  I met face-to-face with this patient and reviewed the medical history and physical exam as performed in the emergency department by Ethelda Chick, MD on 08/23/12 at 1416 hours. I agree with the findings of this exam.   Blood pressure 114/81, pulse 63, temperature 98.3 F (36.8 C), temperature source Oral, resp. rate 18, height 5' (1.524 m), weight 49.896 kg (110 lb), SpO2 99.00%.Body mass index is 21.48 kg/(m^2).   General Appearance: Casual   Eye Contact:: Good   Speech: Clear and Coherent and Pressured   Volume: Normal   Mood: Anxious, Irritable and Mildly manic   Affect: Blunt, Inappropriate, Labile, Full Range and Tearful   Thought Process: Circumstantial, Irrelevant, Loose and Tangential   Orientation: Full (Time, Place, and Person)   Thought Content: Obsessions, Paranoid Ideation and Rumination   Suicidal Thoughts: No   Homicidal Thoughts: No   Memory: Immediate; Good  Recent; Good  Remote; Good   Judgement: Impaired   Insight: Lacking   Psychomotor Activity: Normal   Concentration: Good   Recall: Good   Akathisia: No   Handed: Right   AIMS (if indicated):   Assets: Communication Skills  Resilience   Sleep: Number of Hours: 5   Current Medications:  Current Facility-Administered Medications   Medication  Dose  Route  Frequency  Provider  Last Rate  Last Dose   .  acetaminophen (TYLENOL) tablet 650 mg  650 mg  Oral  Q6H PRN  Earney Navy, NP     .  alum & mag hydroxide-simeth (MAALOX/MYLANTA) 200-200-20 MG/5ML suspension 30 mL  30 mL  Oral  Q4H PRN  Earney Navy, NP     .  lamoTRIgine (LAMICTAL) tablet 25 mg  25 mg  Oral  Daily  Jorje Guild, PA-C     .  LORazepam (ATIVAN) injection 1 mg  1 mg  Intramuscular  Q6H PRN  Earney Navy, NP     .   LORazepam (ATIVAN) tablet 1 mg  1 mg  Oral  Q8H PRN  Shuvon Rankin, NP     .  magnesium hydroxide (MILK OF MAGNESIA) suspension 30 mL  30 mL  Oral  Daily PRN  Shuvon Rankin, NP     Lab Results: No results found for this or any previous visit (from the past 48 hour(s)).   Physical Findings:   AIMS: Facial and Oral Movements  Muscles of Facial Expression: None, normal  Lips and Perioral Area: None, normal  Jaw: None, normal  Tongue: None, normal,Extremity Movements  Upper (arms, wrists, hands, fingers): None, normal  Lower (legs, knees, ankles, toes): None, normal, Trunk Movements  Neck, shoulders, hips: None, normal, Overall Severity  Severity of abnormal movements (highest score from questions above): None, normal  Incapacitation due to abnormal movements: None, normal  Patient's awareness of abnormal movements (rate only patient's report): No Awareness, Dental Status  Current problems with teeth and/or dentures?: No  Does patient usually wear dentures?: No   CIWA: CIWA-Ar Total: 1  COWS:  Treatment Plan Summary:  Daily contact with patient to assess and evaluate symptoms and progress in treatment  Medication management   Plan:  The patient is currently refusing to take the Depakote that has been ordered for her, and reluctantly took a dose of Celexa this morning after much prompting by her nurse. She is willing to resume Lamictal, but as a condition of taking that she wants to discontinue the Celexa. We will begin to titrate Lamictal at 25 mg daily.   Medical Decision Making  Problem Points: Established problem, worsening (2) and Review of psycho-social stressors (1)  Data Points: Review or order clinical lab tests (1)  Review of new medications or change in dosage (2)   I certify that inpatient services furnished can reasonably be expected to improve the patient's condition.  WATT,ALAN  08/26/2012, 10:15 AM   I have seen, examined and discussed this patient with the above  provider and agree with the findings and plan.  Jacqulyn Cane, M.D.  08/26/2012 8:09 PM

## 2012-09-06 NOTE — Progress Notes (Addendum)
Patient Discharge Instructions:  After Visit Summary (AVS):   Faxed to:  09/06/12 Psychiatric Admission Assessment Note:   Faxed to:  09/06/12 Suicide Risk Assessment - Discharge Assessment:   Faxed to:  09/06/12 Faxed/Sent to the Next Level Care provider:  09/06/12 Faxed to [facility] @ [phone] Faxed to [facility] @ [phone]  Jerelene Redden, 09/06/2012, 11:54 AM

## 2012-09-06 NOTE — Clinical Social Work Note (Signed)
CSW received a call from Arbutus Ped with [facility] on this date.  Ms. Dortha Kern appeared irritated, stating that she told previous CSW that pt was not allowed to follow up with her and that she didn't want pt's d/c paperwork, and thought this would be relayed to pt.  CSW explained that pt requested the info to be sent and that she wanted to follow up with her, but if this was not option that was fine as pt also has a medication management appointment.  Ms. Dortha Kern stated that she wanted it documented that pt is not to follow up with her and that she would be faxing the paperwork to pt's psychiatrist as well.  CSW stated that was fine.    Tina [name], [place] 09/06/2012  2:24 PM

## 2012-09-10 NOTE — Discharge Summary (Signed)
Seen and agreed. Sandip Power, MD 

## 2012-10-15 NOTE — Consult Note (Signed)
Seen and agreed. Ephriam Turman, MD 

## 2013-02-04 DIAGNOSIS — A6920 Lyme disease, unspecified: Secondary | ICD-10-CM | POA: Insufficient documentation

## 2013-02-14 ENCOUNTER — Encounter (HOSPITAL_COMMUNITY): Payer: Self-pay | Admitting: Emergency Medicine

## 2013-02-14 ENCOUNTER — Inpatient Hospital Stay (HOSPITAL_COMMUNITY)
Admission: RE | Admit: 2013-02-14 | Discharge: 2013-02-20 | DRG: 885 | Disposition: A | Discharge status: Home / Self Care | Payer: BC Managed Care – HMO | Attending: Psychiatry | Admitting: Psychiatry

## 2013-02-14 ENCOUNTER — Emergency Department (HOSPITAL_COMMUNITY)
Admission: EM | Admit: 2013-02-14 | Discharge: 2013-02-14 | Disposition: A | Discharge status: Other Acute Inpatient Hospital | Payer: BC Managed Care – PPO | Attending: Emergency Medicine | Admitting: Emergency Medicine

## 2013-02-14 ENCOUNTER — Encounter (HOSPITAL_COMMUNITY): Payer: Self-pay | Admitting: *Deleted

## 2013-02-14 DIAGNOSIS — Z008 Encounter for other general examination: Secondary | ICD-10-CM | POA: Insufficient documentation

## 2013-02-14 DIAGNOSIS — L709 Acne, unspecified: Secondary | ICD-10-CM | POA: Diagnosis present

## 2013-02-14 DIAGNOSIS — L0390 Cellulitis, unspecified: Secondary | ICD-10-CM | POA: Diagnosis present

## 2013-02-14 DIAGNOSIS — E039 Hypothyroidism, unspecified: Secondary | ICD-10-CM | POA: Diagnosis present

## 2013-02-14 DIAGNOSIS — R112 Nausea with vomiting, unspecified: Secondary | ICD-10-CM | POA: Insufficient documentation

## 2013-02-14 DIAGNOSIS — F3110 Bipolar disorder, current episode manic without psychotic features, unspecified: Secondary | ICD-10-CM | POA: Diagnosis present

## 2013-02-14 DIAGNOSIS — R519 Headache, unspecified: Secondary | ICD-10-CM | POA: Diagnosis present

## 2013-02-14 DIAGNOSIS — F32A Depression, unspecified: Secondary | ICD-10-CM | POA: Diagnosis present

## 2013-02-14 DIAGNOSIS — R51 Headache: Secondary | ICD-10-CM | POA: Diagnosis present

## 2013-02-14 DIAGNOSIS — Z79899 Other long term (current) drug therapy: Secondary | ICD-10-CM

## 2013-02-14 DIAGNOSIS — F319 Bipolar disorder, unspecified: Secondary | ICD-10-CM

## 2013-02-14 DIAGNOSIS — F17200 Nicotine dependence, unspecified, uncomplicated: Secondary | ICD-10-CM | POA: Insufficient documentation

## 2013-02-14 DIAGNOSIS — Z3202 Encounter for pregnancy test, result negative: Secondary | ICD-10-CM | POA: Insufficient documentation

## 2013-02-14 DIAGNOSIS — L708 Other acne: Secondary | ICD-10-CM | POA: Diagnosis present

## 2013-02-14 DIAGNOSIS — F329 Major depressive disorder, single episode, unspecified: Secondary | ICD-10-CM

## 2013-02-14 DIAGNOSIS — E2740 Unspecified adrenocortical insufficiency: Secondary | ICD-10-CM

## 2013-02-14 DIAGNOSIS — L0291 Cutaneous abscess, unspecified: Secondary | ICD-10-CM | POA: Diagnosis present

## 2013-02-14 DIAGNOSIS — E2749 Other adrenocortical insufficiency: Secondary | ICD-10-CM | POA: Diagnosis present

## 2013-02-14 DIAGNOSIS — Z8619 Personal history of other infectious and parasitic diseases: Secondary | ICD-10-CM | POA: Insufficient documentation

## 2013-02-14 HISTORY — DX: Headache, unspecified: R51.9

## 2013-02-14 HISTORY — DX: Personal history of other infectious and parasitic diseases: Z86.19

## 2013-02-14 HISTORY — DX: Unspecified adrenocortical insufficiency: E27.40

## 2013-02-14 HISTORY — DX: Headache: R51

## 2013-02-14 HISTORY — DX: Cellulitis, unspecified: L03.90

## 2013-02-14 HISTORY — DX: Acne, unspecified: L70.9

## 2013-02-14 LAB — RAPID URINE DRUG SCREEN, HOSP PERFORMED
Barbiturates: NOT DETECTED
Cocaine: NOT DETECTED
Opiates: NOT DETECTED

## 2013-02-14 LAB — COMPREHENSIVE METABOLIC PANEL
ALT: 25 U/L (ref 0–35)
Alkaline Phosphatase: 51 U/L (ref 39–117)
BUN: 12 mg/dL (ref 6–23)
CO2: 24 mEq/L (ref 19–32)
Calcium: 9.7 mg/dL (ref 8.4–10.5)
Creatinine, Ser: 0.51 mg/dL (ref 0.50–1.10)
GFR calc Af Amer: >90 mL/min (ref >90)
GFR calc non Af Amer: >90 mL/min (ref >90)
Glucose, Bld: 105 mg/dL — ABNORMAL HIGH (ref 70–99)
Potassium: 4 mEq/L (ref 3.5–5.1)
Sodium: 139 mEq/L (ref 135–145)
Total Bilirubin: 0.4 mg/dL (ref 0.3–1.2)
Total Protein: 7 g/dL (ref 6.0–8.3)

## 2013-02-14 LAB — CBC
Hemoglobin: 14.6 g/dL (ref 12.0–15.0)
MCH: 32.5 pg (ref 26.0–34.0)
MCHC: 36.3 g/dL — ABNORMAL HIGH (ref 30.0–36.0)
MCV: 89.5 fL (ref 78.0–100.0)

## 2013-02-14 LAB — SALICYLATE LEVEL: Salicylate Lvl: <2 mg/dL — ABNORMAL LOW (ref 2.8–20.0)

## 2013-02-14 LAB — ETHANOL: Alcohol, Ethyl (B): <11 mg/dL (ref 0–11)

## 2013-02-14 MED ORDER — HYDROCORTISONE 5 MG PO TABS
5.0000 mg | ORAL_TABLET | Freq: Two times a day (BID) | ORAL | Status: DC
  Administered 2013-02-15: 5 mg via ORAL
  Filled 2013-02-14 (×6): qty 1

## 2013-02-14 MED ORDER — LIOTHYRONINE SODIUM 25 MCG PO TABS
25.0000 ug | ORAL_TABLET | Freq: Every day | ORAL | Status: DC
  Administered 2013-02-15: 25 ug via ORAL
  Filled 2013-02-14 (×4): qty 1

## 2013-02-14 MED ORDER — THYROID 120 MG PO TABS
120.0000 mg | ORAL_TABLET | Freq: Every day | ORAL | Status: DC
  Administered 2013-02-15 – 2013-02-20 (×6): 120 mg via ORAL
  Filled 2013-02-14 (×4): qty 1
  Filled 2013-02-14: qty 4
  Filled 2013-02-14 (×4): qty 1

## 2013-02-14 MED ORDER — MAGNESIUM HYDROXIDE 400 MG/5ML PO SUSP
30.0000 mL | Freq: Every day | ORAL | Status: DC | PRN
  Administered 2013-02-17: 30 mL via ORAL

## 2013-02-14 MED ORDER — DRONABINOL 5 MG PO CAPS
10.0000 mg | ORAL_CAPSULE | Freq: Two times a day (BID) | ORAL | Status: DC
Start: 2013-02-15 — End: 2013-02-15
  Administered 2013-02-15: 10 mg via ORAL
  Filled 2013-02-14: qty 2

## 2013-02-14 MED ORDER — ALUM & MAG HYDROXIDE-SIMETH 200-200-20 MG/5ML PO SUSP: 30.0000 mL | ORAL | Status: DC | PRN

## 2013-02-14 MED ORDER — ACETAMINOPHEN 325 MG PO TABS: 650.0000 mg | ORAL_TABLET | Freq: Four times a day (QID) | ORAL | Status: DC | PRN

## 2013-02-14 NOTE — Progress Notes (Signed)
D: Pt in bed resting with eyes closed. Respirations even and unlabored. Pt appears to be in no signs of distress at this time. A: Q62min checks remains for this pt. R: Pt remains safe at this time.   Writer will continue to monitor pt while asleep. There are no scheduled medications to be given at this time.

## 2013-02-14 NOTE — ED Notes (Signed)
Pt arrived from [facility] with a assessment counselor from [facility] who states pt has a bed at [facility] but needed to be medically cleared.  Pt states she took herself off of her bipolar medication in April and went back on them last week.  After going to her PCP it was recommended that she come here for further evaluation and treatment.

## 2013-02-14 NOTE — ED Notes (Signed)
[facility] called for transport. 

## 2013-02-14 NOTE — Tx Team (Signed)
Initial Interdisciplinary Treatment Plan  PATIENT STRENGTHS: (choose at least two) Ability for insight Average or above average intelligence Capable of independent living  PATIENT STRESSORS: Medication change or noncompliance   PROBLEM LIST: Problem List/Patient Goals Date to be addressed Date deferred Reason deferred Estimated date of resolution  Bipolar disorder 02/14/13                                                      DISCHARGE CRITERIA:  Ability to meet basic life and health needs Improved stabilization in mood, thinking, and/or behavior Verbal commitment to aftercare and medication compliance  PRELIMINARY DISCHARGE PLAN: Attend aftercare/continuing care group Return to previous living arrangement  PATIENT/FAMIILY INVOLVEMENT: This treatment plan has been presented to and reviewed with the patient, Tina [name], and/or family member, .  The patient and family have been given the opportunity to ask questions and make suggestions.  Tina [name], [place] 02/14/2013, 6:06 PM

## 2013-02-14 NOTE — ED Provider Notes (Signed)
CSN: [id number]     Arrival date & time 02/14/13  1348 History  This chart was scribed for non-physician practitioner working with Junius Argyle, MD by Ashley Jacobs, ED scribe. This patient was seen in room WOTF/NONE and the patient's care was started at 3:21 PM.  First MD Initiated Contact with Patient 02/14/13 1356     Chief Complaint  Patient presents with  . Medical Clearance   (Consider location/radiation/quality/duration/timing/severity/associated sxs/prior Treatment) HPI HPI Comments: Tina [name] is a 42 y.o. female who arrived from [facility] with an assessment counselor to the Emergency Department for Medical Clearance. Per Nurse's notes she stopping taking her medications and now she is back on them. After going to her PCP, Dr. Consuelo Pandy she was sent to the ED for further evaluation. She has a past medical hx of hypothyroidism, bipolar 2 disorder and depression. Pt denies SI and HI "at this moment". She takes Marinol 10 mg and Cytomel 25 mcg regularly.  Pt complains that she vomited once and feeling nauseous throughout the week. She smokes everyday and drinks alcohol week.    Past Medical History  Diagnosis Date  . History of chicken pox   . Hypothyroidism   . Genital warts   . Depression   . Bipolar 2 disorder    Past Surgical History  Procedure Laterality Date  . Tonsillectomy    . Cesarean section  2003, 2007, 2011  . Umbilical hernia repair    . Inguinal hernia repair Bilateral    Family History  Problem Relation Age of Onset  . Heart disease Other     Grandparent   History  Substance Use Topics  . Smoking status: Current Every Day Smoker -- 3.00 packs/day    Types: Cigarettes  . Smokeless tobacco: Not on file  . Alcohol Use: Yes     Comment: social drinker-few drinks a week    OB History   Grav Para Term Preterm Abortions TAB SAB Ect Mult Living                 Review of Systems  Constitutional: Negative for fever.  Gastrointestinal: Positive for  nausea and vomiting.  Psychiatric/Behavioral: Negative for suicidal ideas.    Allergies  Vancomycin and Sulfa antibiotics  Home Medications   No current outpatient prescriptions on file. BP 117/72  Pulse 65  Temp(Src) 98.5 F (36.9 C) (Oral)  Resp 16  SpO2 96%  LMP 02/10/2013 Physical Exam  Nursing note and vitals reviewed. Constitutional: She is oriented to person, place, and time. She appears well-developed and well-nourished. No distress.  HENT:  Head: Normocephalic and atraumatic.  Eyes: Conjunctivae are normal. No scleral icterus.  Neck: Normal range of motion.  Cardiovascular: Normal rate, regular rhythm and normal heart sounds.   Pulmonary/Chest: Effort normal. No respiratory distress.  Musculoskeletal: Normal range of motion.  Neurological: She is alert and oriented to person, place, and time.  Skin: Skin is warm and dry. She is not diaphoretic.  Psychiatric: Her behavior is normal. Thought content normal. She exhibits a depressed mood. She expresses no homicidal and no suicidal ideation. She expresses no suicidal plans and no homicidal plans.    ED Course  Procedures (including critical care time) DIAGNOSTIC STUDIES: Oxygen Saturation is 96% on room air, normal by my interpretation.    COORDINATION OF CARE: 3:25 PM Discussed course of care with pt . Pt understands and agrees.  Labs Review Labs Reviewed  CBC - Abnormal; Notable for the following:  MCHC 36.3 (*)    All other components within normal limits  COMPREHENSIVE METABOLIC PANEL - Abnormal; Notable for the following:    Glucose, Bld 105 (*)    All other components within normal limits  SALICYLATE LEVEL - Abnormal; Notable for the following:    Salicylate Lvl <2.0 (*)    All other components within normal limits  URINE RAPID DRUG SCREEN (HOSP PERFORMED) - Abnormal; Notable for the following:    Tetrahydrocannabinol POSITIVE (*)    All other components within normal limits  ACETAMINOPHEN LEVEL   ETHANOL  POCT PREGNANCY, URINE   Imaging Review No results found.  EKG Interpretation   None       MDM   1. Medical clearance for psychiatric admission    Pt sent to ED from [facility] for med clearance.  Pt is poor historian.  States she is not SI/HI "at the moment."  Pt does have bed assigned at [facility].  Pt medically cleared for transfer.  I personally performed the services described in this documentation, which was scribed in my presence. The recorded information has been reviewed and is accurate.     Junius Finner, PA-C 02/15/13 [phone]

## 2013-02-14 NOTE — Progress Notes (Signed)
42 year old female pt admitted on voluntary basis. Pt reports that she had been off her medicine since leaving this facility in June and only as recently as a week ago started taking them again. Pt reports that when she was here in June her family thought she was having a manic episode but she did not and did not want to keep taking the medications. Pt reports that now she is on a downward spiral and endorses feelings of depression, however she denies SI and is able to contract for safety on the unit. Pt does have a rash on back and states that her doctor said it may be a staph infection and says she has been taking doxycycline for it. Her skin is intact and there are no open sores on back on admission. Pt was oriented to the unit and safety maintained.

## 2013-02-14 NOTE — BH Assessment (Signed)
Assessment Note  Tina [name] is an 42 y.o. female presents voluntarily as a walk-in to [facility] for assessment of depression with suicidal ideation.  Tina [name] reports that she has a diagnosis of bipolar disorder and was involuntarily committed at [facility] in March, but was very resistant to treatment because she did not think she needed it.  She currently states, "I was a terrible patient here.  I guess I was manic because now I'm so not.  I'm at the bottom of the barrel.  I feel like I'm drowning." She reports she has been fantasizing about committing suicide any way possible including hanging herself, jumping off a parking garage, and jumping in front of traffic.  She has history of two prior attempts, most recently in 2012 with the primary trigger being her difficulties with her marriage.  She endorses multiple stressors primarily connected to her separation including personal, occupational, and financial stressors.  She has not taken medication since March when discharged from [facility] and reports that she went to her PCP, Tina [name] at [facility], who started her on Lamictal 50mg  and Prozac 20mg , but after a week and a half her symptoms worsened and he told her to stop taking the medication and go to [facility] for inpatient treatment.  Tina [name] was accepted for admission to [facility] by Fransisca Kaufmann to [phone] under the service of Dr Addison Naegeli.  She was sent to [facility] for medical clearance related to significant weight loss-she reports dropping from a size six to barely a size two in a few mos.  She also reports a history of DHEA levels being high, hypothyroidism, and chronic Lyme disease.  Report called to charge nurse in ED and Dr Ladona Ridgel.  Axis I: Bipolar, Depressed Axis II: Deferred Axis III:  Past Medical History  Diagnosis Date  . History of chicken pox   . Hypothyroidism   . Genital warts   . Depression   . Bipolar 2 disorder    Axis IV: economic problems, occupational problems, problems with access to health care  services and problems with primary support group Axis V: 31-40 impairment in reality testing  Past Medical History:  Past Medical History  Diagnosis Date  . History of chicken pox   . Hypothyroidism   . Genital warts   . Depression   . Bipolar 2 disorder     Past Surgical History  Procedure Laterality Date  . Tonsillectomy    . Cesarean section  2003, 2007, 2011  . Umbilical hernia repair    . Inguinal hernia repair Bilateral     Family History:  Family History  Problem Relation Age of Onset  . Heart disease Other     Grandparent    Social History:  reports that she has been smoking Cigarettes.  She has been smoking about 3.00 packs per day. She does not have any smokeless tobacco history on file. She reports that she drinks alcohol. She reports that she does not use illicit drugs.  Additional Social History:  Alcohol / Drug Use History of alcohol / drug use?: No history of alcohol / drug abuse  CIWA:   COWS:    Allergies:  Allergies  Allergen Reactions  . Vancomycin Itching  . Sulfa Antibiotics Rash    Home Medications:  (Not in a hospital admission)  OB/GYN Status:  Patient's last menstrual period was 02/10/2013.  General Assessment Data Location of Assessment: [facility] Assessment Services Is this a Tele or Face-to-Face Assessment?: Face-to-Face Is this an Initial Assessment  or a Re-assessment for this encounter?: Initial Assessment Living Arrangements: Alone Can pt return to current living arrangement?: Yes Admission Status: Voluntary Is patient capable of signing voluntary admission?: Yes Transfer from: Acute Hospital Referral Source: Self/Family/Friend     [facility] Crisis Care Plan Living Arrangements: Alone  Education Status Is patient currently in school?: No Highest grade of school patient has completed: College Educ[employer]ino  Risk to self Suicidal Ideation: Yes-Currently Present Suicidal Intent: Yes-Currently Present Is patient at risk for suicide?:  Yes Suicidal Plan?: Yes-Currently Present Specify Current Suicidal Plan: jump off parking garage, walk in front of a veheicle, hang self Access to Means: Yes Specify Access to Suicidal Means: enviromental What has been your use of drugs/alcohol within the last 12 months?: n Previous Attempts/Gestures: Yes How many times?: 2 Triggers for Past Attempts: Family contact;Other personal contacts;Other (Comment) (mania) Intentional Self Injurious Behavior: None Family Suicide History: No Recent stressful life event(s): Other (Comment);Turmoil (Comment);Recent negative physical changes (separated from spouse) Persecutory voices/beliefs?: No Depression: Yes Depression Symptoms: Despondent;Tearfulness;Insomnia;Isolating;Fatigue;Guilt;Loss of interest in usual pleasures;Feeling worthless/self pity Substance abuse history and/or treatment for substance abuse?: No Suicide prevention information given to non-admitted patients: Not applicable  Risk to Others Homicidal Ideation: No Thoughts of Harm to Others: No Current Homicidal Intent: No Current Homicidal Plan: No Access to Homicidal Means: No History of harm to others?: No Assessment of Violence: None Noted Does patient have access to weapons?: No Criminal Charges Pending?: No Does patient have a court date: No  Psychosis Hallucinations: None noted Delusions: None noted  Mental Status Report Appear/Hygiene: Disheveled Eye Contact: Fair Motor Activity: Freedom of movement Speech: Logical/coherent;Soft Level of Consciousness: Quiet/awake Mood: Depressed Affect: Depressed;Blunted Anxiety Level: Panic Attacks Panic attack frequency: daily Most recent panic attack: wakes up with a panic attack each morning Thought Processes: Coherent;Relevant Judgement: Unimpaired Orientation: Person;Place;Time;Situation Obsessive Compulsive Thoughts/Behaviors: Minimal  Cognitive Functioning Concentration: Decreased Memory: Recent Impaired;Remote  Impaired IQ: Average Insight: Fair Impulse Control: Fair Appetite: Poor Weight Loss: 40 Weight Gain: 0 Sleep: Decreased Total Hours of Sleep: 8 (broken) Vegetative Symptoms: Staying in bed;Decreased grooming  ADLScreening [facility] Assessment Services) Patient's cognitive ability adequate to safely complete daily activities?: Yes Patient able to express need for assistance with ADLs?: Yes Independently performs ADLs?: Yes (appropriate for developmental age)  Prior Inpatient Therapy Prior Inpatient Therapy: Yes Prior Therapy Dates: 2014, 2012 Prior Therapy Facilty/Provider(s): [facility], [facility], [place] Reason for Treatment: Depression, SI, Mania  Prior Outpatient Therapy Prior Outpatient Therapy: Yes Prior Therapy Dates: March 2013 Prior Therapy Facilty/Provider(s): [place] Reason for Treatment: Bipolar disorder  ADL Screening (condition at time of admission) Patient's cognitive ability adequate to safely complete daily activities?: Yes Patient able to express need for assistance with ADLs?: Yes Independently performs ADLs?: Yes (appropriate for developmental age)       Abuse/Neglect Assessment (Assessment to be complete while patient is alone) Physical Abuse: Denies Verbal Abuse: Denies Sexual Abuse: Denies Exploitation of patient/patient's resources: Denies     Merchant navy officer (For Healthcare) Advance Directive: Patient does not have advance directive;Patient would not like information Pre-existing out of facility DNR order (yellow form or pink MOST form): No Nutrition Screen- [facility] Adult/[facility]/AP Patient's home diet: Regular  Additional Information 1:1 In Past 12 Months?: No CIRT Risk: No Elopement Risk: No Does patient have medical clearance?: Yes     Disposition:  Disposition Initial Assessment Completed for this Encounter: Yes Disposition of Patient: Inpatient treatment program Type of inpatient treatment program: Adult  On Site Evaluation by:  Reviewed with Physician:    Steward Ros 02/14/2013 2:23 PM

## 2013-02-15 ENCOUNTER — Encounter (HOSPITAL_COMMUNITY): Payer: Self-pay | Admitting: Internal Medicine

## 2013-02-15 DIAGNOSIS — E039 Hypothyroidism, unspecified: Secondary | ICD-10-CM

## 2013-02-15 DIAGNOSIS — F1210 Cannabis abuse, uncomplicated: Secondary | ICD-10-CM

## 2013-02-15 DIAGNOSIS — L0291 Cutaneous abscess, unspecified: Secondary | ICD-10-CM

## 2013-02-15 DIAGNOSIS — L0390 Cellulitis, unspecified: Secondary | ICD-10-CM | POA: Diagnosis present

## 2013-02-15 DIAGNOSIS — L709 Acne, unspecified: Secondary | ICD-10-CM | POA: Diagnosis present

## 2013-02-15 DIAGNOSIS — E2740 Unspecified adrenocortical insufficiency: Secondary | ICD-10-CM | POA: Diagnosis present

## 2013-02-15 DIAGNOSIS — Z8619 Personal history of other infectious and parasitic diseases: Secondary | ICD-10-CM | POA: Insufficient documentation

## 2013-02-15 DIAGNOSIS — F3160 Bipolar disorder, current episode mixed, unspecified: Secondary | ICD-10-CM

## 2013-02-15 DIAGNOSIS — E2749 Other adrenocortical insufficiency: Secondary | ICD-10-CM

## 2013-02-15 MED ORDER — QUETIAPINE FUMARATE 25 MG PO TABS: 25.0000 mg | ORAL_TABLET | Freq: Three times a day (TID) | ORAL | Status: DC

## 2013-02-15 MED ORDER — LURASIDONE HCL 40 MG PO TABS
40.0000 mg | ORAL_TABLET | Freq: Every day | ORAL | Status: DC
  Filled 2013-02-15 (×2): qty 1

## 2013-02-15 MED ORDER — ENSURE COMPLETE PO LIQD
237.0000 mL | Freq: Two times a day (BID) | ORAL | Status: DC
  Administered 2013-02-15 – 2013-02-19 (×9): 237 mL via ORAL

## 2013-02-15 MED ORDER — GABAPENTIN 300 MG PO CAPS
300.0000 mg | ORAL_CAPSULE | Freq: Two times a day (BID) | ORAL | Status: DC
  Filled 2013-02-15 (×4): qty 1

## 2013-02-15 MED ORDER — CLINDAMYCIN HCL 150 MG PO CAPS
300.0000 mg | ORAL_CAPSULE | Freq: Three times a day (TID) | ORAL | Status: DC
  Administered 2013-02-15 – 2013-02-20 (×15): 300 mg via ORAL
  Filled 2013-02-15 (×6): qty 1
  Filled 2013-02-15: qty 14
  Filled 2013-02-15 (×5): qty 1
  Filled 2013-02-15: qty 14
  Filled 2013-02-15: qty 1
  Filled 2013-02-15: qty 14
  Filled 2013-02-15 (×2): qty 1
  Filled 2013-02-15: qty 2
  Filled 2013-02-15 (×3): qty 1
  Filled 2013-02-15 (×2): qty 14

## 2013-02-15 MED ORDER — CHLORHEXIDINE GLUCONATE CLOTH 2 % EX PADS
6.0000 | MEDICATED_PAD | Freq: Every day | CUTANEOUS | Status: DC
  Administered 2013-02-17 – 2013-02-19 (×2): 6 via TOPICAL
  Filled 2013-02-15 (×7): qty 6

## 2013-02-15 MED ORDER — HYDROCORTISONE 5 MG PO TABS
5.0000 mg | ORAL_TABLET | Freq: Two times a day (BID) | ORAL | Status: DC
  Administered 2013-02-16 – 2013-02-20 (×10): 5 mg via ORAL
  Filled 2013-02-15 (×15): qty 1

## 2013-02-15 MED ORDER — HYDROCORTISONE 5 MG PO TABS
5.0000 mg | ORAL_TABLET | Freq: Two times a day (BID) | ORAL | Status: DC
  Filled 2013-02-15 (×3): qty 1

## 2013-02-15 MED ORDER — TRAZODONE HCL 100 MG PO TABS
100.0000 mg | ORAL_TABLET | Freq: Every day | ORAL | Status: DC
  Administered 2013-02-15: 100 mg via ORAL
  Filled 2013-02-15 (×3): qty 1

## 2013-02-15 MED ORDER — MUPIROCIN 2 % EX OINT
TOPICAL_OINTMENT | Freq: Two times a day (BID) | CUTANEOUS | Status: DC
  Administered 2013-02-15: 21:00:00 via NASAL
  Administered 2013-02-17: 1 via NASAL
  Administered 2013-02-19 (×2): via NASAL
  Filled 2013-02-15 (×3): qty 22

## 2013-02-15 MED ORDER — QUETIAPINE FUMARATE 100 MG PO TABS: 100.0000 mg | ORAL_TABLET | Freq: Every day | ORAL | Status: DC

## 2013-02-15 NOTE — Progress Notes (Signed)
D: Patient currently denies SI/HI/AVH. She was tearful while presenting to window for meds.  Pt. Requesting to know when the doctor was going to be in.  Pt. States she is bipolar and has been off of her medication for several months.  States she had an involuntary admission in June 2014 for manic state but did not stay on medication regimen once discharged.  Pt. Is calm and cooperative and interacting appropriately with others.  A: Patient given emotional support from RN. Patient encouraged to come to staff with concerns and/or questions. Patient's medication routine continued. Patient's orders and plan of care reviewed.   R: Patient remains appropriate and cooperative. Will continue to monitor patient q15 minutes for safety.

## 2013-02-15 NOTE — [facility] Group Notes (Signed)
[facility] LCSW Group Therapy  Feelings Around Relapse  02/15/2013 2:22 PM  Type of Therapy:  Group Therapy  Participation Level:  Did Not Attend  Wynn Banker 02/15/2013, 2:22 PM

## 2013-02-15 NOTE — [facility] Suicide Risk Assessment (Signed)
Suicide Risk Assessment  Admission Assessment     Nursing information obtained from:    Demographic factors:    Current Mental Status:    Loss Factors:    Historical Factors:    Risk Reduction Factors:     CLINICAL FACTORS:   Severe Anxiety and/or Agitation Bipolar Disorder:   Mixed State Depression:   Anhedonia Hopelessness Impulsivity Insomnia Recent sense of peace/wellbeing Severe Unstable or Poor Therapeutic Relationship Previous Psychiatric Diagnoses and Treatments Medical Diagnoses and Treatments/Surgeries  COGNITIVE FEATURES THAT CONTRIBUTE TO RISK:  Closed-mindedness Loss of executive function Polarized thinking    SUICIDE RISK:   Moderate:  Frequent suicidal ideation with limited intensity, and duration, some specificity in terms of plans, no associated intent, good self-control, limited dysphoria/symptomatology, some risk factors present, and identifiable protective factors, including available and accessible social support.  PLAN OF CARE: Patient admitted for crisis stabilization, safety monitoring and medication management for her bipolar disorder most recent episode is mixed.  I certify that inpatient services furnished can reasonably be expected to improve the patient's condition.  Nehemiah Settle., M.D. 02/15/2013, 12:21 PM

## 2013-02-15 NOTE — H&P (Signed)
Psychiatric Admission Assessment Adult  Patient Identification:  Tina [name] Date of Evaluation:  02/15/2013 Chief Complaint:  Bipolar History of Present Illness:  Tina [name] is a 42 year old WF who comes in to [facility] after presenting to the [facility] reporting that her PCP Dr. Consuelo Pandy suggested that she come in to have her symptoms evaluated. She has a history of bipolar disorder and was admitted to [facility] in July of this year in a manic state. She did not continue her medications as she felt that they were not needed.  She was resistant to treatment at the time and a forced medication order was written. She did respond well at the time, but clearly has decompensated.         Of particular notice is that she is now taking Marinol and Cytomel which is new for her.  Currently she endorses crying 7/7 days racing thoughts, poor focus, worsening depression, increased sleep, she is currently endorsing suicidal ideation, but has no plan, she also endorses extreme panic with out agoraphobia.      Tina [name] reports she was on the Marinol for her chronic Lyme's disease for which she is followed by a doctor in [place]. She has never tested positive for LD. She is followed for her thyroid condition by a NP [place] who sent her to the ED. Elements:  Location:  Adult unit. Quality:  chronic. Severity:  severe. Timing:  worsening since April. Duration:  years. Context:  patient is having difficulties at work, home life and physically.. Associated Signs/Synptoms: Depression Symptoms:  depressed mood, anhedonia, hypersomnia, psychomotor retardation, feelings of worthlessness/guilt, difficulty concentrating, hopelessness, suicidal thoughts without plan, weight loss, (Hypo) Manic Symptoms:  Distractibility, Flight of Ideas, Impulsivity, Irritable Mood, Labiality of Mood, Anxiety Symptoms:  Excessive Worry, Panic Symptoms, Psychotic Symptoms:  denies PTSD Symptoms: NA  Psychiatric Specialty Exam: Physical Exam   Constitutional: She appears well-developed and well-nourished.  Psychiatric:  The patient is seen and the chart is reviewed. I agree with the exam completed by the provider in the ED with no exceptions.    ROS  Blood pressure 101/78, pulse 90, temperature 97.5 F (36.4 C), temperature source Oral, resp. rate 16, height 5' (1.524 m), weight 45.813 kg (101 lb), last menstrual period 02/10/2013.Body mass index is 19.73 kg/(m^2).  General Appearance: Disheveled  Eye Solicitor::  Fair  Speech:  Pressured  Volume:  Normal  Mood:  Anxious and Depressed  Affect:  Labile and Tearful  Thought Process:  Circumstantial  Orientation:  Full (Time, Place, and Person)  Thought Content:  WDL  Suicidal Thoughts:  Yes.  without intent/plan  Homicidal Thoughts:  No  Memory:  NA  Judgement:  Impaired  Insight:  Lacking  Psychomotor Activity:  Restlessness  Concentration:  Poor  Recall:  Fair  Akathisia:  No  Handed:  Right  AIMS (if indicated):     Assets:  Communication Skills Desire for Improvement Housing Vocational/Educational  Sleep:  Number of Hours: 6.75    Past Psychiatric History: Diagnosis:  Hospitalizations:  Outpatient Care:  Substance Abuse Care:  Self-Mutilation:  Suicidal Attempts:  Violent Behaviors:   Past Medical History:   Past Medical History  Diagnosis Date  . History of chicken pox   . Hypothyroidism   . Genital warts   . Depression   . Bipolar 2 disorder    None. Allergies:   Allergies  Allergen Reactions  . Vancomycin Itching  . Sulfa Antibiotics Rash   PTA Medications: Prescriptions prior to admission  Medication Sig Dispense Refill  . dronabinol (MARINOL) 10 MG capsule Take 10 mg by mouth 2 (two) times daily before a meal.      . hydrocortisone (CORTEF) 5 MG tablet Take 5 mg by mouth 2 (two) times daily.      Marland Kitchen lamoTRIgine (LAMICTAL) 100 MG tablet Take 50 mg by mouth daily. Take 1/2 tablet by mouth twice a day.      . liothyronine (CYTOMEL) 25 MCG  tablet Take 25 mcg by mouth daily.      Marland Kitchen thyroid (ARMOUR) 120 MG tablet Take 120 mg by mouth daily before breakfast.        Previous Psychotropic Medications:  Medication/Dose    Haldol               Substance Abuse History in the last 12 months:  no  Consequences of Substance Abuse: NA  Social History:  reports that she has been smoking Cigarettes.  She has been smoking about 3.00 packs per day. She does not have any smokeless tobacco history on file. She reports that she drinks alcohol. She reports that she does not use illicit drugs. Additional Social History: History of alcohol / drug use?: No history of alcohol / drug abuse  Current Place of Residence:   Place of Birth:   Family Members: Marital Status:  Single Children:  Sons:  Daughters: Relationships: Education:   Educational Problems/Performance: Religious Beliefs/Practices: History of Abuse (Emotional/Phsycial/Sexual) Occupational Experiences; Military History:   Legal History: Hobbies/Interests:  Family History:   Family History  Problem Relation Age of Onset  . Heart disease Other     Grandparent    Results for orders placed during the hospital encounter of 02/14/13 (from the past 72 hour(s))  URINE RAPID DRUG SCREEN (HOSP PERFORMED)     Status: Abnormal   Collection Time    02/14/13  2:13 PM      Result Value Range   Opiates NONE DETECTED  NONE DETECTED   Cocaine NONE DETECTED  NONE DETECTED   Benzodiazepines NONE DETECTED  NONE DETECTED   Amphetamines NONE DETECTED  NONE DETECTED   Tetrahydrocannabinol POSITIVE (*) NONE DETECTED   Barbiturates NONE DETECTED  NONE DETECTED   Comment:            DRUG SCREEN FOR MEDICAL PURPOSES     ONLY.  IF CONFIRMATION IS NEEDED     FOR ANY PURPOSE, NOTIFY LAB     WITHIN 5 DAYS.                LOWEST DETECTABLE LIMITS     FOR URINE DRUG SCREEN     Drug Class       Cutoff (ng/mL)     Amphetamine      1000     Barbiturate      200     Benzodiazepine    200     Tricyclics       300     Opiates          300     Cocaine          300     THC              50  POCT PREGNANCY, URINE     Status: None   Collection Time    02/14/13  2:21 PM      Result Value Range   Preg Test, Ur NEGATIVE  NEGATIVE   Comment:  THE SENSITIVITY OF THIS     METHODOLOGY IS >24 mIU/mL  ACETAMINOPHEN LEVEL     Status: None   Collection Time    02/14/13  2:45 PM      Result Value Range   Acetaminophen (Tylenol), Serum <15.0  10 - 30 ug/mL   Comment:            THERAPEUTIC CONCENTRATIONS VARY     SIGNIFICANTLY. A RANGE OF 10-30     ug/mL MAY BE AN EFFECTIVE     CONCENTRATION FOR MANY PATIENTS.     HOWEVER, SOME ARE BEST TREATED     AT CONCENTRATIONS OUTSIDE THIS     RANGE.     ACETAMINOPHEN CONCENTRATIONS     >150 ug/mL AT 4 HOURS AFTER     INGESTION AND >50 ug/mL AT 12     HOURS AFTER INGESTION ARE     OFTEN ASSOCIATED WITH TOXIC     REACTIONS.  CBC     Status: Abnormal   Collection Time    02/14/13  2:45 PM      Result Value Range   WBC 6.4  4.0 - 10.5 K/uL   RBC 4.49  3.87 - 5.11 MIL/uL   Hemoglobin 14.6  12.0 - 15.0 g/dL   HCT 16.1  09.6 - 04.5 %   MCV 89.5  78.0 - 100.0 fL   MCH 32.5  26.0 - 34.0 pg   MCHC 36.3 (*) 30.0 - 36.0 g/dL   RDW 40.9  81.1 - 91.4 %   Platelets 260  150 - 400 K/uL  COMPREHENSIVE METABOLIC PANEL     Status: Abnormal   Collection Time    02/14/13  2:45 PM      Result Value Range   Sodium 139  135 - 145 mEq/L   Potassium 4.0  3.5 - 5.1 mEq/L   Chloride 104  96 - 112 mEq/L   CO2 24  19 - 32 mEq/L   Glucose, Bld 105 (*) 70 - 99 mg/dL   BUN 12  6 - 23 mg/dL   Creatinine, Ser 7.82  0.50 - 1.10 mg/dL   Calcium 9.7  8.4 - 95.6 mg/dL   Total Protein 7.0  6.0 - 8.3 g/dL   Albumin 4.0  3.5 - 5.2 g/dL   AST 21  0 - 37 U/L   ALT 25  0 - 35 U/L   Alkaline Phosphatase 51  39 - 117 U/L   Total Bilirubin 0.4  0.3 - 1.2 mg/dL   GFR calc non Af Amer >90  >90 mL/min   GFR calc Af Amer >90  >90 mL/min   Comment:  (NOTE)     The eGFR has been calculated using the CKD EPI equation.     This calculation has not been validated in all clinical situations.     eGFR's persistently <90 mL/min signify possible Chronic Kidney     Disease.  ETHANOL     Status: None   Collection Time    02/14/13  2:45 PM      Result Value Range   Alcohol, Ethyl (B) <11  0 - 11 mg/dL   Comment:            LOWEST DETECTABLE LIMIT FOR     SERUM ALCOHOL IS 11 mg/dL     FOR MEDICAL PURPOSES ONLY  SALICYLATE LEVEL     Status: Abnormal   Collection Time    02/14/13  2:45 PM  Result Value Range   Salicylate Lvl <2.0 (*) 2.8 - 20.0 mg/dL   Psychological Evaluations:  Assessment:   DSM5:  Schizophrenia Disorders:   Obsessive-Compulsive Disorders:   Trauma-Stressor Disorders:   Substance/Addictive Disorders:   Depressive Disorders:  Bipolar disorder most recent episode mixed.  AXIS I:  Bipolar disorder MRE mixed, cannabis abuse. AXIS II:  Deferred AXIS III:   Past Medical History  Diagnosis Date  . History of chicken pox   . Hypothyroidism   . Genital warts   . Depression   . Bipolar 2 disorder    AXIS IV:  problems related to social environment and problems with primary support group AXIS V:  41-50 serious symptoms  Treatment Plan/Recommendations:   1. Admit for crisis management and stabilization. 2. Medication management to reduce current symptoms to base line and improve the patient's overall level of functioning. 3. Treat health problems as indicated. 4. Develop treatment plan to decrease risk of relapse upon discharge and to reduce the need for readmission. 5. Psycho-social education regarding relapse prevention and self care. 6. Health care follow up as needed for medical problems. 7. Restart home medications where appropriate.  Treatment Plan Summary: Daily contact with patient to assess and evaluate symptoms and progress in treatment Medication management Current Medications:  Current  Facility-Administered Medications  Medication Dose Route Frequency Provider Last Rate Last Dose  . acetaminophen (TYLENOL) tablet 650 mg  650 mg Oral Q6H PRN Nehemiah Settle, MD      . alum & mag hydroxide-simeth (MAALOX/MYLANTA) 200-200-20 MG/5ML suspension 30 mL  30 mL Oral Q4H PRN Nehemiah Settle, MD      . dronabinol (MARINOL) capsule 10 mg  10 mg Oral BID AC Nehemiah Settle, MD      . hydrocortisone (CORTEF) tablet 5 mg  5 mg Oral BID Nehemiah Settle, MD   5 mg at 02/15/13 6962  . liothyronine (CYTOMEL) tablet 25 mcg  25 mcg Oral Daily Nehemiah Settle, MD      . magnesium hydroxide (MILK OF MAGNESIA) suspension 30 mL  30 mL Oral Daily PRN Nehemiah Settle, MD      . thyroid (ARMOUR) tablet 120 mg  120 mg Oral QAC breakfast Nehemiah Settle, MD   120 mg at 02/15/13 9528    Observation Level/Precautions:  routine  Laboratory:  TSH, free T3, free T4  Psychotherapy:  Individual and group  Medications:  D/C Marinol, will start Seroquel 50mg  po TID and 100mg  at hs for mania, anxiety, and bipolar disorder  Consultations:  Internal Medicine, Considering Endocrine  Discharge Concerns:  Access to care  Estimated LOS:   5-7 days  Other:     I certify that inpatient services furnished can reasonably be expected to improve the patient's condition.   Rona Ravens. Mashburn RPAC 6:07 PM 02/15/2013  Patient is seen for psychiatric evaluation, suicide risk assessment, this discussed with the treatment team and formulated treatment plan. Reviewed the information documented and agree with the treatment plan.  Nehemiah Settle., M.D. 02/18/2013 3:14 PM

## 2013-02-15 NOTE — Progress Notes (Signed)
Adult Psychoeducational Group Note  Date:  02/15/2013 Time:  11:05 PM  Group Topic/Focus:  Wrap-Up Group:   The focus of this group is to help patients review their daily goal of treatment and discuss progress on daily workbooks.  Participation Level:  Minimal  Participation Quality:  Attentive  Affect:  Flat  Cognitive:  Alert  Insight: Limited  Engagement in Group:  Limited  Modes of Intervention:  Discussion  Additional Comments:  Pt revealed that she's here to work on her depression and rated her day a 4. She didn't have much to say during group and she had had a flat affect.   [place] Shi K 02/15/2013, 11:05 PM

## 2013-02-15 NOTE — Tx Team (Signed)
Interdisciplinary Treatment Plan Update   Date Reviewed:  02/15/2013  Time Reviewed:  10:21 AM  Progress in Treatment:   Attending groups: Yes Participating in groups: Yes Taking medication as prescribed: Yes  Tolerating medication: Yes Family/Significant other contact made: .counselor to assess for collateral contact Patient understands diagnosis: Yes  Discussing patient identified problems/goals with staff: Yes Medical problems stabilized or resolved: Yes Denies suicidal/homicidal ideation: Yes Patient has not harmed self or others: Yes  For review of initial/current patient goals, please see plan of care.  Estimated Length of Stay:  3-5 days  Reasons for Continued Hospitalization:  Anxiety Depression Medication stabilization Suicidal ideation  New Problems/Goals identified:    Discharge Plan or Barriers:   Home with outpatient follow up to be determined  Additional Comments: Tina [name] is an 42 y.o. female presents voluntarily as a walk-in to [facility] for assessment of depression with suicidal ideation. Tina [name] reports that she has a diagnosis of bipolar disorder and was involuntarily committed at [facility] in March, but was very resistant to treatment because she did not think she needed it. She currently states, "I was a terrible patient here. I guess I was manic because now I'm so not. I'm at the bottom of the barrel. I feel like I'm drowning." She reports she has been fantasizing about committing suicide any way possible including hanging herself, jumping off a parking garage, and jumping in front of traffic. She has history of two prior attempts, most recently in 2012 with the primary trigger being her difficulties with her marriage. She endorses multiple stressors primarily connected to her separation including personal, occupational, and financial stressors. She has not taken medication since March when discharged from [facility] and reports that she went to her PCP, Tina [name] at  [facility], who started her on Lamictal 50mg  and Prozac 20mg , but after a week and a half her symptoms worsened and he told her to stop taking the medication and go to [facility] for inpatient treatment.     Attendees:  Patient:  02/15/2013 10:21 AM   Signature: Mervyn Gay, MD 02/15/2013 10:21 AM  Signature:  Verne Spurr, PA 02/15/2013 10:21 AM  Signature:   02/15/2013 10:21 AM  Signature:  Jonne Ply, RN 02/15/2013 10:21 AM  Signature:   02/15/2013 10:21 AM  Signature:  Juline Patch, LCSW 02/15/2013 10:21 AM  Signature:  [name], LCSW 02/15/2013 10:21 AM  Signature:  02/15/2013 10:21 AM  Signature:   02/15/2013 10:21 AM  Signature:  02/15/2013  10:21 AM  Signature:   Onnie Boer, RN URCM 02/15/2013  10:21 AM   02/15/2013  10:21 AM    Scribe for Treatment Team:   Juline Patch,  02/15/2013 10:21 AM

## 2013-02-15 NOTE — Consult Note (Signed)
[facility] Medical Consultation  VERNICA WACHTEL KVQ:[id number] DOB: 1970-04-05 DOA: 02/14/2013 PCP: Elie Goody, NP   Requesting physician: Verne Spurr Date of consultation: 02/15/2013 Reason for consultation: management of endocrine problems  Impression/Recommendations Active Problems:   Depression   Active Problems:   Depression  Bipolar with delusions, manic and depressive symptoms.  Agree that patient's recent thyroid medication changes could have caused her to become hypo or hyper thyroid, thus exacerbating her symptoms. -  Management of psychiatric illness per [facility]  Hypothyroidism, appears to have been possible central etiology several years ago with low TSH, fT4 and fT3 drawn simultaneously, however, her TSH levels have risen. She has been on Armour Thyroid for several years and her TSH levels have been close to one to 2 and her free T3 and free T4 levels have also been checked and have been low to normal. It appears that she does best when her TSH is suppressed to a level of approximately 0.7-1.  Recently she was started on Cymotal.   -  TSH -  D/c liothyronine -  Continue armour at current dose and adjust dose based on TSH level -  Needs endocrinology f/u  Adrenal insufficiency, not diagnosed with typical methods and did not have a confirmatory stim test, but reports that her morning cortisol level was low. -  Would not discontinue medication in the setting of acute psychiatric illness, but DO recommend that she see an Endocrinologist who will stop her medication and do confirmatory cortisol stim testing to verify diagnosis.   -  Change hydrocortisone to 5mg  qAM and 5mg  at  and lunch  Chronic headache, sounds consistent with rebound headache -  Tylenol prn headache -  Stop all NSAIDS - Patient advised that she may have severe headache for several days, but will eventually improve -  Agree with not using marinol for headache.  Cellulitis, possibly due to  superinfection with staph  -  Clindamycin x 7 days -  Chlorhexidine wipes once daily for next week -  Mupirocin nasal ointment bid  I am concerned that the diagnosis of this patient's endocrine problems has not been as rigorous as what is standard practice (such as cortisol stim test) prior to initiation of treatment and in some cases has been possibly overrigorous, such as repeated testing of testosterone and DHEA and full TFT panels when a simple TSH may have been adequate.  I strongly encourage her to see an Endocrinologist for a formal diagnosis of her endocrine problems and further management.    I will followup again tomorrow to review TSH. Please contact me if I can be of assistance in the meanwhile. Thank you for this consultation.  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 Symptoms included reporting racing thoughts, and focal defocusing, crying frequently, sleeping more than usual. At the time of admission she was endorsing suicidal ideation.    Weight at time of admission is 45.8 kg.  Patient reports taking liothyronine 25 mcg daily and Armour Thyroid 120 mg once daily.  May have lost some weight, she has had diarrhea for the last week. She denies any hair loss.    Possible adrenal insufficiency diagnosed with saliva tests taken 4 times per day, the first test was not until 8 AM.    Complains of daily headaches for which he takes Excedrin twice  daily. She states that she first started having headaches when she was diagnosed with possible Lyme disease several years ago. She tried multiple medications for her headaches and eventually was given a prescription for Marinol to use as needed for headaches, however she states that she has continued to have daily headaches. She has been taking Excedrin  twice daily.   Review of Systems:   Denies fevers, chills, changes to hearing and vision.  + weight gain.  Denies rhinorrhea, sinus congestion, sore throat.  Denies chest pain and palpitations.  Denies SOB, wheezing, cough.  Denies nausea, vomiting, constipation.  Denies dysuria, frequency, urgency, polyuria, polydipsia.  Denies hematemesis, blood in stools, melena, abnormal bruising or bleeding.  Denies lymphadenopathy.  Denies arthralgias, myalgias.  Acne-like rash on her back worse over last week.  Denies lower extremity edema.  Denies focal numbness, weakness, slurred speech, confusion, facial droop.  Manic and depressive symptoms.    Past Medical History  Diagnosis Date  . History of chicken pox   . Hypothyroidism   . Genital warts   . Depression   . Bipolar 2 disorder    Past Surgical History  Procedure Laterality Date  . Tonsillectomy    . Cesarean section  2003, 2007, 2011  . Umbilical hernia repair    . Inguinal hernia repair Bilateral    Social History:  reports that she has been smoking Cigarettes.  She has been smoking about 3.00 packs per day. She does not have any smokeless tobacco history on file. She reports that she drinks alcohol. She reports that she does not use illicit drugs.  Allergies  Allergen Reactions  . Vancomycin Itching  . Sulfa Antibiotics Rash   Family History  Problem Relation Age of Onset  . Heart disease Other     Grandparent    Prior to Admission medications   Medication Sig Start Date End Date Taking? Authorizing Provider  dronabinol (MARINOL) 10 MG capsule Take 10 mg by mouth 2 (two) times daily before a meal.    Historical Provider, MD  hydrocortisone (CORTEF) 5 MG tablet Take 5 mg by mouth 2 (two) times daily.    Historical Provider, MD  lamoTRIgine (LAMICTAL) 100 MG tablet Take 50 mg by mouth daily. Take 1/2 tablet by mouth twice a day. 09/03/12   Verne Spurr, PA-C  liothyronine (CYTOMEL) 25 MCG tablet Take 25 mcg by mouth daily.     Historical Provider, MD  thyroid (ARMOUR) 120 MG tablet Take 120 mg by mouth daily before breakfast.    Historical Provider, MD   Physical Exam: Blood pressure 101/78, pulse 90, temperature 97.5 F (36.4 C), temperature source Oral, resp. rate 16, height 5' (1.524 m), weight 45.813 kg (101 lb), last menstrual period 02/10/2013. Filed Vitals:   02/14/13 1742 02/15/13 0600 02/15/13 0601  BP: 109/75 97/68 101/78  Pulse: 68 72 90  Temp: 97.8 F (36.6 C) 97.5 F (36.4 C)   TempSrc: Oral    Resp: 18 16   Height: 5' (1.524 m)    Weight: 45.813 kg (101 lb)       General:  Thin CF, NAD  Eyes:  PERRL, anicteric, non-injected.  ENT:  Nares clear.  OP clear, non-erythematous without plaques or exudates.  MMM.  Neck:  Supple without TM or JVD.    Lymph:  No cervical, supraclavicular, or submandibular LAD.  Cardiovascular:  RRR, normal S1, S2, without m/r/g.  2+ pulses, warm extremities  Respiratory:  CTA bilaterally without increased WOB.  Abdomen:  NABS.  Soft, ND/NT.    Skin:  Acneiform rash with surrounding cellulitic rash 8cm diameter left lower back.  Also acne on face and chest.  Musculoskeletal:  Normal bulk and tone.  No LE edema.  Psychiatric:  A & O x 4.  Appropriate affect.  Neurologic:  CN 3-12 intact.  5/5 strength.  Sensation intact.  Labs on Admission:  Basic Metabolic Panel:  Recent Labs Lab 02/14/13 1445  NA 139  K 4.0  CL 104  CO2 24  GLUCOSE 105*  BUN 12  CREATININE 0.51  CALCIUM 9.7   Liver Function Tests:  Recent Labs Lab 02/14/13 1445  AST 21  ALT 25  ALKPHOS 51  BILITOT 0.4  PROT 7.0  ALBUMIN 4.0   No results found for this basename: LIPASE, AMYLASE,  in the last 168 hours No results found for this basename: AMMONIA,  in the last 168 hours CBC:  Recent Labs Lab 02/14/13 1445  WBC 6.4  HGB 14.6  HCT 40.2  MCV 89.5  PLT 260   Cardiac Enzymes: No results found for this basename: CKTOTAL, CKMB, CKMBINDEX, TROPONINI,  in the  last 168 hours BNP: No components found with this basename: POCBNP,  CBG: No results found for this basename: GLUCAP,  in the last 168 hours  Radiological Exams on Admission: No results found.   Time spent: 75 min  Renae Fickle [facility] Pager [phone]  If 7PM-7AM, please contact night-coverage [email or web address] Password [email or web address] 02/15/2013, 6:19 PM

## 2013-02-15 NOTE — BHH Group Notes (Signed)
[facility] LCSW Aftercare Discharge Planning Group Note   02/15/2013 11:27 AM    Participation Quality:  Appropraite  Mood/Affect:  Depressed Tearful  Depression Rating:  10  Anxiety Rating:  9  Thoughts of Suicide:  Yes  Will you contract for safety?   Yes  Current AVH:  No  Plan for Discharge/Comments:  Patient attended discharge planning group and actively participated in group.  She reports she had been off medications for a while.  She is being followed by PCP but had seen Lauris Poag NP earlier in the year.  CSW provided all participants with daily workbook.   Transportation Means: Patient has transportation.   Supports:  Patient has a support system.   Dayle Mcnerney, Joesph July

## 2013-02-15 NOTE — Progress Notes (Signed)
Nutrition Brief Note  Interventions:   Discussed the importance of nutrition and encouraged intake of food and beverages.     Discussed strategies to improve appetite such as getting on a regular meal schedule   Supplements: Ensure Complete BID   Recommend MD discuss Marinol dosage with pt and she reports current amount is making her feel too sedated   Patient identified on the Malnutrition Screening Tool (MST) Report.  Wt Readings from Last 10 Encounters:  02/14/13 101 lb (45.813 kg)  08/25/12 110 lb (49.896 kg)  12/23/10 147 lb (66.679 kg)   Body mass index is 19.73 kg/(m^2). Patient meets criteria for normal weight based on current BMI.   Discussed intake PTA with patient and compared to intake presently.  Discussed changes in intake, if any, and encouraged adequate intake of meals and snacks. Current diet order is regular and pt is also offered choice of unit snacks mid-morning and mid-afternoon.  Pt is eating as desired.   Labs and medications reviewed.   Pt is a 42 y.o. female who arrived from [facility] with an assessment counselor to the Emergency Department for Medical Clearance. Per Nurse's notes she stopping taking her medications and now she is back on them. After going to her PCP, Dr. Consuelo Pandy she was sent to the ED for further evaluation. She has a past medical hx of hypothyroidism, bipolar 2 disorder and depression. Pt denies SI and HI "at this moment". She takes Marinol 10 mg and Cytomel 25 mcg regularly. Pt complains that she vomited once and feeling nauseous throughout the week. She smokes everyday and drinks alcohol week.   Met with pt who reports eating small meals throughout the day PTA, typically 2.5 meals. States she developed nausea on Sunday PTA which is improved today. Reports being on Marinol since January of this year but denies any significant improvement in her appetite. Thinks she has lost 25 pounds this year due to stress.   Nutrition Dx:  Unintended wt change r/t  suboptimal oral intake AEB pt report  No additional nutrition interventions warranted at this time. If nutrition issues arise, please consult RD.   Levon Hedger MS, RD, LDN [phone] Pager [phone] After Hours Pager

## 2013-02-15 NOTE — [facility] Counselor (Signed)
Adult Psychosocial Assessment Update Interdisciplinary Team  [facility] admissions[facility]discharges:  Admissions Discharges  Date:  08/25/12 Date: 09/03/12  Date: Date:  Date: Date:  Date: Date:  Date: Date:   Changes since the last Psychosocial Assessment (including adherence to outpatient mental health and/or substance abuse treatment, situational issues contributing to decompensation and/or relapse). Patient advised of increased depression and SI.  She stated stressors are separation from husband of 15 years in February of this year, raising three children on her own and finances.             Discharge Plan 1. Will you be returning to the same living situation after discharge?   Yes: No:      If no, what is your plan?    Yes, patient will return to her home at discharge.       2. Would you like a referral for services when you are discharged? Yes:     If yes, for what services?  No:       Uncertain.  Patient advised she would like to continue seeing Lauris Poag NP if she is still practicing.  Otherwise, she may need a referral for outpatient service or else she will continue with her PCP for follow up.       Summary and Recommendations (to be completed by the evaluator)   Tina [name] is a 42 year old Caucasian female admitted with Bipolar Disorder.  She will benefit from crisis stabilization, evaluation for medication, psycho-education groups for coping skills development, group therapy and case management for discharge planning.                      Signature:  Wynn Banker, 02/15/2013 11:05 AM

## 2013-02-15 NOTE — ED Provider Notes (Signed)
Medical screening examination/treatment/procedure(s) were performed by non-physician practitioner and as supervising physician I was immediately available for consultation/collaboration.  EKG Interpretation   None         Casmere Hollenbeck S Lachlan Pelto, MD 02/15/13 1403 

## 2013-02-15 NOTE — Progress Notes (Signed)
Writer spoke with patient 1:1 and she reports her day being ok and is hopeful that her medications will be straightened out. Patient received her scheduled 2000 meds. Patient reports being off her medications for a while and now realizes that she needs to stay on them. Patient reports she is currently going through a separation and will now be on her on and she reports that at first while she was off her meds she felt good but with the reality of her situation now she plans to stay on her meds. Patient reports passive si and verbally contracts for safety with Press photographer. Patient denies hi/a/v hallucinations. Encouraged patient to stay on her meds after discharge and if needing assistance with any other issues to speak with her Child psychotherapist. Patient was receptive. Safety maintianed on unit with 15 min checks.

## 2013-02-16 DIAGNOSIS — F3130 Bipolar disorder, current episode depressed, mild or moderate severity, unspecified: Secondary | ICD-10-CM

## 2013-02-16 DIAGNOSIS — F411 Generalized anxiety disorder: Secondary | ICD-10-CM

## 2013-02-16 DIAGNOSIS — F319 Bipolar disorder, unspecified: Secondary | ICD-10-CM | POA: Diagnosis present

## 2013-02-16 LAB — TSH: TSH: <0.008 u[IU]/mL — ABNORMAL LOW (ref 0.350–4.500)

## 2013-02-16 MED ORDER — QUETIAPINE FUMARATE 25 MG PO TABS
25.0000 mg | ORAL_TABLET | Freq: Three times a day (TID) | ORAL | Status: DC
  Administered 2013-02-16: 25 mg via ORAL
  Filled 2013-02-16 (×8): qty 1

## 2013-02-16 MED ORDER — HALOPERIDOL 2 MG PO TABS: 2.0000 mg | ORAL_TABLET | Freq: Every day | ORAL | Status: DC

## 2013-02-16 MED ORDER — QUETIAPINE FUMARATE 100 MG PO TABS
100.0000 mg | ORAL_TABLET | Freq: Every day | ORAL | Status: DC
  Administered 2013-02-16: 100 mg via ORAL
  Filled 2013-02-16 (×3): qty 1

## 2013-02-16 NOTE — [facility] Group Notes (Signed)
[facility] Group Notes:  (Clinical Social Work)  02/16/2013   3:00-4:00PM  Summary of Progress/Problems:   The main focus of today's process group was for the patient to identify ways in which they have sabotaged their own mental health wellness/recovery.  Motivational interviewing was used to explore the reasons they engage in this behavior, and reasons they may have for wanting to change.  The Stages of Change were explained to the group using a handout, and patients identified where they are with regard to changing self-defeating behaviors.  The patient expressed that she was hospitalized after having a terrible year and going into a downward spiral in her depression.  She stated she stopped her medications, including about 70 meds daily for long-term Lyme's disease.  She engaged in a lot of negative self talk also.  She was then called out of the room to see a physician extender.  Type of Therapy:  Process Group  Participation Level:  Active  Participation Quality:  Attentive and Sharing  Affect:  Blunted and Depressed  Cognitive:  Alert  Insight:  Improving  Engagement in Therapy:  Engaged  Modes of Intervention:  Education, Motivational Interviewing   Ambrose Mantle, LCSW 02/16/2013, 4:00pm

## 2013-02-16 NOTE — Progress Notes (Signed)
[facility] PROGRESS NOTE  Tina [name] EAV:[id number] DOB: 07-29-1970 DOA: 02/14/2013 PCP: Elie Goody, NP  Assessment/Plan: #1 bipolar disorder/depression Management per primary team.  #2 hypothyroidism Appears to possibly be a from a central etiology several years ago with low TSH, free T4 and free T3 drawn simultaneously. Patient was on armour thyroid for several years and recently started on Cytomel. Repeat TSH is essentially undetectable at less than 0.008. Patient's Cytomel was discontinued yesterday. It has been recommended to either discontinue patient's armour or decrease the dose with close outpatient followup with an endocrinologist. Patient is resistant to discontinuing or adjusting the dose of her armour thyroid at this time. Cytomel has been discontinued. Patient states she's not stopping her thyroid medication. Patient states she'll followup with her PCP on discharge and have thyroid medications addressed and adjusted at that time.  #3 questionable adrenal insufficiency Patient was not diagnosed with typical methods of her confirmatory stimulation test. Continue current dose of hydrocortisone. Will need outpatient followup.  #4 chronic headache Sounds like a rebound headache. Tylenol as needed.  #5 cellulitis Continue clindamycin for: 7 days. Continue chlorhexidine wipes once daily for one week. Continue mupirocin nasal ointment twice daily.  #6 borderline hypotension Patient chronically has low blood pressures. Patient was also on trazodone last night. Patient mentating well. Patient ambulating. Patient is asymptomatic. Follow blood pressures.  Nothing further to add at this time will sign off. Please call questions.  Code Status: Full Family Communication: Updated patient no family present. Disposition Plan: Per primary team   Consultants:  [facility] Dr. Malachi Bonds 02/15/2013  Procedures:  None  Antibiotics:  Clindamycin  02/15/2013  HPI/Subjective: Patient states was given some trazodone last night for sleep which helped with her sleep however made her tired today and drop her blood pressure. No other complaints.  Objective: Filed Vitals:   02/16/13 0700  BP: 85/57  Pulse: 69  Temp: 97.8 F (36.6 C)  Resp: 16   No intake or output data in the 24 hours ending 02/16/13 1518 Filed Weights   02/14/13 1742  Weight: 45.813 kg (101 lb)    Exam:   General:  No apparent distress  Cardiovascular: Regular rate rhythm no murmurs rubs or gallops  Respiratory: Clear to auscultation bilaterally. No wheezes, no crackles, no rhonchi.  Abdomen: Soft, nontender, nondistended, positive bowel sounds.  Musculoskeletal: No clubbing cyanosis or edema  Data Reviewed: Basic Metabolic Panel:  Recent Labs Lab 02/14/13 1445  NA 139  K 4.0  CL 104  CO2 24  GLUCOSE 105*  BUN 12  CREATININE 0.51  CALCIUM 9.7   Liver Function Tests:  Recent Labs Lab 02/14/13 1445  AST 21  ALT 25  ALKPHOS 51  BILITOT 0.4  PROT 7.0  ALBUMIN 4.0   No results found for this basename: LIPASE, AMYLASE,  in the last 168 hours No results found for this basename: AMMONIA,  in the last 168 hours CBC:  Recent Labs Lab 02/14/13 1445  WBC 6.4  HGB 14.6  HCT 40.2  MCV 89.5  PLT 260   Cardiac Enzymes: No results found for this basename: CKTOTAL, CKMB, CKMBINDEX, TROPONINI,  in the last 168 hours BNP (last 3 results) No results found for this basename: PROBNP,  in the last 8760 hours CBG: No results found for this basename: GLUCAP,  in the last 168 hours  No results found for this or any previous visit (from the past 240 hour(s)).   Studies: No results found.  Scheduled Meds: .  Chlorhexidine Gluconate Cloth  6 each Topical Q0600  . clindamycin  300 mg Oral TID  . feeding supplement (ENSURE COMPLETE)  237 mL Oral BID BM  . hydrocortisone  5 mg Oral BID  . mupirocin ointment   Nasal BID  . QUEtiapine  100 mg  Oral QHS  . QUEtiapine  25 mg Oral TID  . thyroid  120 mg Oral QAC breakfast  . traZODone  100 mg Oral QHS   Continuous Infusions:   Principal Problem:   Bipolar disorder, unspecified Active Problems:   Depression   Unspecified hypothyroidism   Bipolar I disorder, most recent episode (or current) manic   Adrenal insufficiency   Acne   Cellulitis   Headache    Time spent: 30 mins    Nyeisha Goodall M.D. [facility] Pager [phone]. If 7PM-7AM, please contact night-coverage at [email or web address], password [facility] 02/16/2013, 3:18 PM  LOS: 2 days

## 2013-02-16 NOTE — [facility] Group Notes (Signed)
[facility] Group Notes:  (Nursing/MHT/Case Management/Adjunct)  Date:  02/16/2013  Time:  2:41 PM  Type of Therapy:  Nurse Education  Participation Level:  Active  Participation Quality:  Appropriate  Affect:  Appropriate  Cognitive:  Alert  Insight:  Appropriate  Engagement in Group:  Engaged  Modes of Intervention:  Education  Summary of Progress/Problems:Pt stated she was trying to eat better and eat snacks between meals to help get her energy back. She wants to know when she will start on her pschy meds.   Rodman Key [facility] 02/16/2013, 2:41 PM

## 2013-02-16 NOTE — Progress Notes (Signed)
[facility] MD Progress Note  02/16/2013 3:28 PM Tina [name]  MRN:  [id number] Subjective:  Sleep was restless due to increase dreams but decreased anxiety, appetite is "slowly getting better, I don't feel like I am going to barf every time I eat."  Depression is improved with decrease in anxiety--"feels in a zone".  Lots of changes this year, separated--moved, new job, new family role, medical issues. 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 History of chicken pox   . Hypothyroidism   . Genital warts   . Depression   . Bipolar 2 disorder   . Adrenal insufficiency   . Acne   . Cellulitis   . Hx of Lyme disease   . Headache    Axis IV: other psychosocial or environmental problems, problems related to social environment and problems with primary support group Axis V: 31-40 impairment in reality testing  ADL's:  Intact  Sleep: Good  Appetite:  Poor  Suicidal Ideation:  Denies Homicidal Ideation:  Denies  Psychiatric Specialty Exam: Review of Systems  Constitutional: Negative.   HENT: Negative.   Eyes: Negative.   Respiratory: Negative.   Cardiovascular: Negative.   Gastrointestinal: Negative.   Genitourinary: Negative.   Musculoskeletal: Negative.   Skin: Negative.   Neurological: Negative.   Endo/Heme/Allergies: Negative.   Psychiatric/Behavioral: Positive for depression and substance abuse. The patient is nervous/anxious.     Blood pressure 85/57, pulse 69, temperature 97.8 F (36.6 C), temperature source Oral, resp. rate 16, height 5' (1.524 m), weight 45.813 kg (101 lb), last menstrual period 02/10/2013.Body mass index is 19.73 kg/(m^2).  General Appearance: Casual  Eye Contact::  Fair  Speech:  Normal Rate  Volume:  Decreased  Mood:  Anxious and Depressed  Affect:  Congruent  Thought Process:  Coherent  Orientation:  Full (Time, Place, and Person)  Thought  Content:  WDL  Suicidal Thoughts:  No  Homicidal Thoughts:  No  Memory:  Immediate;   Fair Recent;   Fair Remote;   Fair  Judgement:  Impaired  Insight:  Fair  Psychomotor Activity:  Decreased  Concentration:  Fair  Recall:  Fair  Akathisia:  No  Handed:  Right  AIMS (if indicated):     Assets:  Leisure Time Physical Health Resilience Social Support  Sleep:  Number of Hours: 6.75   Current Medications: Current Facility-Administered Medications  Medication Dose Route Frequency Provider Last Rate Last Dose  . acetaminophen (TYLENOL) tablet 650 mg  650 mg Oral Q6H PRN Nehemiah Settle, MD      . alum & mag hydroxide-simeth (MAALOX/MYLANTA) 200-200-20 MG/5ML suspension 30 mL  30 mL Oral Q4H PRN Nehemiah Settle, MD      . Chlorhexidine Gluconate Cloth 2 % PADS 6 each  6 each Topical Q0600 Renae Fickle, MD      . clindamycin (CLEOCIN) capsule 300 mg  300 mg Oral TID Renae Fickle, MD   300 mg at 02/16/13 1244  . feeding supplement (ENSURE COMPLETE) (ENSURE COMPLETE) liquid 237 mL  237 mL Oral BID BM Lavena Bullion, RD   237 mL at 02/15/13 2011  . hydrocortisone (CORTEF) tablet 5 mg  5 mg Oral BID Renae Fickle, MD   5 mg at 02/16/13 1244  . magnesium hydroxide (MILK OF MAGNESIA) suspension 30 mL  30 mL Oral Daily PRN Nehemiah Settle, MD      .  mupirocin ointment (BACTROBAN) 2 %   Nasal BID Renae Fickle, MD      . QUEtiapine (SEROQUEL) tablet 100 mg  100 mg Oral QHS Nanine Means, NP      . QUEtiapine (SEROQUEL) tablet 25 mg  25 mg Oral TID Nanine Means, NP      . thyroid (ARMOUR) tablet 120 mg  120 mg Oral QAC breakfast Nehemiah Settle, MD   120 mg at 02/16/13 0907  . traZODone (DESYREL) tablet 100 mg  100 mg Oral QHS Nehemiah Settle, MD   100 mg at 02/15/13 2134    Lab Results:  Results for orders placed during the hospital encounter of 02/14/13 (from the past 48 hour(s))  TSH     Status: Abnormal   Collection Time     02/15/13  7:44 PM      Result Value Range   TSH <0.008 (*) 0.350 - 4.500 uIU/mL   Comment: Performed at [facility]    Physical Findings: AIMS: Facial and Oral Movements Muscles of Facial Expression: None, normal Lips and Perioral Area: None, normal Jaw: None, normal Tongue: None, normal,Extremity Movements Upper (arms, wrists, hands, fingers): None, normal Lower (legs, knees, ankles, toes): None, normal, Trunk Movements Neck, shoulders, hips: None, normal, Overall Severity Severity of abnormal movements (highest score from questions above): None, normal Incapacitation due to abnormal movements: None, normal Patient's awareness of abnormal movements (rate only patient's report): No Awareness, Dental Status Current problems with teeth and/or dentures?: No Does patient usually wear dentures?: No  CIWA:    COWS:     Treatment Plan Summary: Daily contact with patient to assess and evaluate symptoms and progress in treatment Medication management  Plan:  Review of chart, vital signs, medications, and notes. 1-Individual and group therapy 2-Medication management for depression and anxiety:  Medications reviewed with the patient and Cytomel discontinued per consultant, Seroquel added, Trazodone discontinued per her request 3-Coping skills for depression, anxiety 4-Continue crisis stabilization and management 5-Address health issues--monitoring vital signs, stable 6-Treatment plan in progress to prevent relapse of depression and anxiety  Medical Decision Making Problem Points:  Established problem, stable/improving (1) and Review of psycho-social stressors (1) Data Points:  Review of medication regiment & side effects (2)  I certify that inpatient services furnished can reasonably be expected to improve the patient's condition.   Nanine Means, PMH-NP 02/16/2013, 3:28 PM I agreed with findings and treatment plan of this patient

## 2013-02-16 NOTE — Progress Notes (Signed)
Adult Psychoeducational Group Note  Date:  02/16/2013 Time:  3:50 PM  Group Topic/Focus:  Healthy Communication:   The focus of this group is to discuss communication, barriers to communication, as well as healthy ways to communicate with others.  Participation Level:  Active  Participation Quality:  Appropriate  Affect:  Flat  Cognitive:  Appropriate  Insight: Good  Engagement in Group:  Engaged  Modes of Intervention:  Activity and Discussion   Elijio Miles 02/16/2013, 3:50 PM

## 2013-02-16 NOTE — Progress Notes (Signed)
Adult Psychoeducational Group Note  Date:  02/16/2013 Time:  8:00 pm  Group Topic/Focus:  Wrap-Up Group:   The focus of this group is to help patients review their daily goal of treatment and discuss progress on daily workbooks.  Participation Level:  Did Not Attend   Modena Nunnery 02/16/2013, 11:21 PM

## 2013-02-16 NOTE — Progress Notes (Signed)
D) Pt stating she was feeling a bit unstable on her feet this morning. "Dizzy". Believes that it is because of the Trazodone.  Concerned that she has not been put on her "psych meds" yet. Talked about the rash she has on her back. States that it started several months ago when she started working a new job. States it started gradually, and with only one or two and has turned into what it is now. Has attended the groups and interacts with her peers appropriately. Rates her depression and hopelessness both at an 8 and denies SI and HI. A) given support, reassurance and praise. Encouraged to speak with her doctor and to let him know her concerns. Educated on how to stand and ambulate since Pt is feeling dizzy.  R) Denies SI and HI.

## 2013-02-17 MED ORDER — LAMOTRIGINE 25 MG PO TABS
25.0000 mg | ORAL_TABLET | Freq: Every day | ORAL | Status: DC
  Administered 2013-02-17 – 2013-02-20 (×4): 25 mg via ORAL
  Filled 2013-02-17: qty 4
  Filled 2013-02-17 (×6): qty 1

## 2013-02-17 MED ORDER — ESCITALOPRAM OXALATE 5 MG PO TABS
5.0000 mg | ORAL_TABLET | Freq: Every day | ORAL | Status: DC
  Administered 2013-02-17: 5 mg via ORAL
  Filled 2013-02-17 (×4): qty 1

## 2013-02-17 NOTE — Progress Notes (Signed)
[facility] MD Progress Note  02/17/2013 1:23 PM COVA KNIERIEM  MRN:  [id number] Subjective:  Patient was too sedated from her Seroquel and her vision was blurry, discontinued---restarted Lamictal and Lexapro due this combination working in the past.  Depression remains with feeling like a Zombie, anxiety almost gone, did her 3 positives at bedtime and will do three more tomorrow. Diagnosis:   DSM5:  Axis I: Bipolar, Depressed Axis II: Deferred Axis III:  Past Medical History  Diagnosis Date  . History of chicken pox   . Hypothyroidism   . Genital warts   . Depression   . Bipolar 2 disorder   . Adrenal insufficiency   . Acne   . Cellulitis   . Hx of Lyme disease   . Headache    Axis IV: other psychosocial or environmental problems, problems related to social environment and problems with primary support group Axis V: 41-50 serious symptoms  ADL's:  Intact  Sleep: Good  Appetite:  Fair  Suicidal Ideation:  Denies Homicidal Ideation:  Denies  Psychiatric Specialty Exam: Review of Systems  Constitutional: Negative.   HENT: Negative.   Eyes: Negative.   Respiratory: Negative.   Cardiovascular: Negative.   Gastrointestinal: Negative.   Genitourinary: Negative.   Musculoskeletal: Negative.   Skin: Negative.   Neurological: Negative.   Endo/Heme/Allergies: Negative.   Psychiatric/Behavioral: Positive for depression. The patient is nervous/anxious.     Blood pressure 85/57, pulse 69, temperature 97.8 F (36.6 C), temperature source Oral, resp. rate 16, height 5' (1.524 m), weight 45.813 kg (101 lb), last menstrual period 02/10/2013.Body mass index is 19.73 kg/(m^2).  General Appearance: Casual  Eye Contact::  Fair  Speech:  Normal Rate  Volume:  Normal  Mood:  Anxious and Depressed  Affect:  Congruent  Thought Process:  Coherent  Orientation:  Full (Time, Place, and Person)  Thought Content:  WDL  Suicidal Thoughts:  No  Homicidal Thoughts:  No  Memory:  Immediate;    Fair Recent;   Fair Remote;   Fair  Judgement:  Fair  Insight:  Fair  Psychomotor Activity:  Decreased  Concentration:  Fair  Recall:  Fair  Akathisia:  No  Handed:  Right  AIMS (if indicated):     Assets:  Resilience Social Support  Sleep:  Number of Hours: 6.75   Current Medications: Current Facility-Administered Medications  Medication Dose Route Frequency Provider Last Rate Last Dose  . acetaminophen (TYLENOL) tablet 650 mg  650 mg Oral Q6H PRN Nehemiah Settle, MD      . alum & mag hydroxide-simeth (MAALOX/MYLANTA) 200-200-20 MG/5ML suspension 30 mL  30 mL Oral Q4H PRN Nehemiah Settle, MD      . Chlorhexidine Gluconate Cloth 2 % PADS 6 each  6 each Topical Q0600 Renae Fickle, MD      . clindamycin (CLEOCIN) capsule 300 mg  300 mg Oral TID Renae Fickle, MD   300 mg at 02/17/13 0840  . escitalopram (LEXAPRO) tablet 5 mg  5 mg Oral Daily Nanine Means, NP      . feeding supplement (ENSURE COMPLETE) (ENSURE COMPLETE) liquid 237 mL  237 mL Oral BID BM Lavena Bullion, RD   237 mL at 02/16/13 2123  . hydrocortisone (CORTEF) tablet 5 mg  5 mg Oral BID Renae Fickle, MD   5 mg at 02/17/13 0839  . lamoTRIgine (LAMICTAL) tablet 25 mg  25 mg Oral Daily Nanine Means, NP      . magnesium hydroxide (MILK OF  MAGNESIA) suspension 30 mL  30 mL Oral Daily PRN Nehemiah Settle, MD   30 mL at 02/17/13 1001  . mupirocin ointment (BACTROBAN) 2 %   Nasal BID Renae Fickle, MD      . thyroid (ARMOUR) tablet 120 mg  120 mg Oral QAC breakfast Nehemiah Settle, MD   120 mg at 02/17/13 4098    Lab Results:  Results for orders placed during the hospital encounter of 02/14/13 (from the past 48 hour(s))  TSH     Status: Abnormal   Collection Time    02/15/13  7:44 PM      Result Value Range   TSH <0.008 (*) 0.350 - 4.500 uIU/mL   Comment: Performed at [facility]    Physical Findings: AIMS: Facial and Oral Movements Muscles of Facial Expression:  None, normal Lips and Perioral Area: None, normal Jaw: None, normal Tongue: None, normal,Extremity Movements Upper (arms, wrists, hands, fingers): None, normal Lower (legs, knees, ankles, toes): None, normal, Trunk Movements Neck, shoulders, hips: None, normal, Overall Severity Severity of abnormal movements (highest score from questions above): None, normal Incapacitation due to abnormal movements: None, normal Patient's awareness of abnormal movements (rate only patient's report): No Awareness, Dental Status Current problems with teeth and/or dentures?: No Does patient usually wear dentures?: No  CIWA:    COWS:     Treatment Plan Summary: Daily contact with patient to assess and evaluate symptoms and progress in treatment Medication management  Plan:  Review of chart, vital signs, medications, and notes. 1-Individual and group therapy 2-Medication management for depression and anxiety:  Medications reviewed with the patient and Seroquel discontinued, Lamictal and Lexapro started per request and similar combination working for her in the past 3-Coping skills for depression, anxiety 4-Continue crisis stabilization and management 5-Address health issues--monitoring vital signs, stable 6-Treatment plan in progress to prevent relapse of depression and anxiety  Medical Decision Making Problem Points:  Established problem, stable/improving (1) and Review of psycho-social stressors (1) Data Points:  Review of new medications or change in dosage (2)  I certify that inpatient services furnished can reasonably be expected to improve the patient's condition.   Nanine Means, PMH-NP 02/17/2013, 1:23 PM I agreed with findings and treatment plan of this patient

## 2013-02-17 NOTE — Progress Notes (Signed)
Adult Psychoeducational Group Note  Date:  02/17/2013 Time:  08:00pm Group Topic/Focus:  Wrap-Up Group:   The focus of this group is to help patients review their daily goal of treatment and discuss progress on daily workbooks.  Participation Level:  Did Not Attend  Participation Quality:    Affect:    Cognitive:    Insight:   Engagement in Group:   Modes of Intervention:   Additional Comments:  Pt did not attend group.  Shelly Bombard D 02/17/2013, 9:25 PM

## 2013-02-17 NOTE — Progress Notes (Signed)
Psychoeducational Group Note  Date: 02/17/2013 Time:  0930  Group Topic/Focus:  Gratefulness:  The focus of this group is to help patients identify what two things they are most grateful for in their lives. What helps ground them and to center them on their work to their recovery.  Participation Level:  Active  Participation Quality:  Appropriate  Affect:  Appropriate  Cognitive:  Oriented  Insight:  Lacking  Engagement in Group:  Engaged  Additional Comments:   Renata Gambino A  

## 2013-02-17 NOTE — Progress Notes (Signed)
D) Pt states that she is not going to take the Seroquel anymore because "I feel terrible" Pt was tearfully making the statement. Denies SI and HI. Put on other medications this morning and is very willing to try them, hoping that they will help her. A) Given support and reassurance throughout the day. Given encouragement to speak with the provider about her concerns over the medications. Praised when appropriate. R) Pt denies SI and HI. Feeling better this afternoon and more hopeful.

## 2013-02-17 NOTE — Progress Notes (Signed)
D Pt. Denies SI and HI, no complaints of pain or discomfort other than being constipated. Pt. Has received MOM and prune juice. Writer will leave a note to the MD for additional medications.  Pt. Has lost a lot of weight and does not have a lot of intake.  A Writer offered support and encouragement.  Instructed pt. To drink a lot of liquids and to ambulate often to increase motility.    R Pt. Remains safe on the unit.  States she has lost so much weight and had a loss of appetite due to stress of separation from her spouse Pt. States her anxiety is down from a 10 to a 4 and her depression is down from a 10 to a 7.

## 2013-02-17 NOTE — Progress Notes (Signed)
Writer entered patients room and observed her lying in bed asleep with eyes closed and respirations even and unlabored. Writer called patients name several times before she woke up. Patient reports being started on seroquel today and she has been feeling very tired and wiped out most of the day. Writer encouraged her to continue to rest and reminded her to dangle before getting up. Writer informed her to use call bell if necessary. Patient voiced no other complaints. Safety maintained on unit with 15 min checks.

## 2013-02-17 NOTE — [facility] Group Notes (Signed)
[facility] Group Notes: (Clinical Social Work)   02/17/2013   3:00-4:00PM  Summary of Progress/Problems: The main focus of today's process group was to identify the patient's current support system and explore what other supports can be put in place. There was also an extensive discussion about what constitutes a healthy support versus an unhealthy support. With some group members' miscomprehension about the role of counseling in recovery, a great deal of the time was spent on discussing expectations for self and others in treatment. The patient stated the current supports in place are her mother, sister, father and stepmother.  She stated she felt "zombified" from last night's medications.  Nonetheless made an effort to participate in group and gave good feedback, although difficult and not well received, to another patient.  Type of Therapy: Process Group with Motivational Interviewing   Participation Level: Active   Participation Quality: Attentive and Sharing   Affect: Blunted   Cognitive: Oriented   Insight: Improving   Engagement in Therapy: Engaged  Modes of Intervention: Support and Processing   Ambrose Mantle, LCSW 02/17/2013, 4:27 PM

## 2013-02-17 NOTE — Progress Notes (Signed)
Psychoeducational Group Note  Date: 02/17/2013 Time: 1015  Group Topic/Focus:  Making Healthy Choices:   The focus of this group is to help patients identify negative/unhealthy choices they were using prior to admission and identify positive/healthier coping strategies to replace them upon discharge.  Participation Level:  Active  Participation Quality:  Appropriate  Affect:  Appropriate  Cognitive:  Appropriate  Insight:  Engaged  Engagement in Group:  Engaged  Additional Comments:    02/17/2013,7:42 PM Graylon Amory, Joie Bimler

## 2013-02-17 NOTE — Progress Notes (Signed)
Adult Psychoeducational Group Note  Date:  02/17/2013 Time:  2:13 PM  Group Topic/Focus:  Therapeutic Activity  Participation Level:  Minimal  Participation Quality:  Appropriate  Affect:  Appropriate  Cognitive:  Appropriate  Insight: Good  Engagement in Group:  Improving  Modes of Intervention:  Activity  Additional Comments:  Pt came in at the end of group because she was speaking with the Doctor. When pt returned she didn't actively participate in group activity.   Tora Perches N 02/17/2013, 2:13 PM

## 2013-02-18 MED ORDER — QUETIAPINE FUMARATE 50 MG PO TABS
50.0000 mg | ORAL_TABLET | Freq: Every day | ORAL | Status: DC
  Administered 2013-02-18 – 2013-02-19 (×2): 50 mg via ORAL
  Filled 2013-02-18: qty 1
  Filled 2013-02-18: qty 4
  Filled 2013-02-18 (×2): qty 1

## 2013-02-18 MED ORDER — QUETIAPINE FUMARATE 25 MG PO TABS
12.5000 mg | ORAL_TABLET | Freq: Three times a day (TID) | ORAL | Status: DC | PRN
  Administered 2013-02-18 – 2013-02-20 (×5): 12.5 mg via ORAL
  Filled 2013-02-18: qty 1
  Filled 2013-02-18: qty 4
  Filled 2013-02-18 (×4): qty 1

## 2013-02-18 NOTE — Progress Notes (Signed)
Adult Psychoeducational Group Note  Date:  02/18/2013 Time:  11:00 AM Group Topic/Focus:  Dimensions of Wellness:   The focus of this group is to introduce the topic of wellness and discuss the role each dimension of wellness plays in total health.  Participation Level:  Minimal  Participation Quality:  Appropriate  Affect:  Blunted  Cognitive:  Alert  Insight: Limited  Engagement in Group:  Limited  Modes of Intervention:  Education  Additional Comments:  Patient attended group but did not contribute to the activity.  Merleen Milliner 02/18/2013, 3:05 PM

## 2013-02-18 NOTE — Progress Notes (Signed)
Patient ID: Tina [name], female   DOB: May 23, 1970, 42 y.o.   MRN: [id number] [facility] MD Progress Note  02/18/2013 1:35 PM Tina [name]  MRN:  [id number]  Subjective:  Today the patient notes that she is feeling more anxious and upset. She does feel that the Lexapro is her primary issue now having just taken it. She is tearful and depressed. Reviewed her labs with her and her TSH was <0.008 and she is clearly in a hyperthyroid state now. The Cytomel was discontinued on Friday by Dr. Malachi Bonds but it will take time for her thyroid to normalize.  Did discuss discontinuing the SSRI for now, and restarting the Seroquel at a lower dose to avoid sedation. She will take 12.5mg  po prn TID for anxiety, and 50mg  at hs.  She is also encouraged to continue taking the antibiotics for her acne which does look better today.  Diagnosis:   DSM5:  Axis I: Bipolar, Depressed Axis II: Deferred Axis III:  Past Medical History  Diagnosis Date  . History of chicken pox   . Hypothyroidism   . Genital warts   . Depression   . Bipolar 2 disorder   . Adrenal insufficiency   . Acne   . Cellulitis   . Hx of Lyme disease   . Headache    Axis IV: other psychosocial or environmental problems, problems related to social environment and problems with primary support group Axis V: 41-50 serious symptoms  ADL's:  Intact  Sleep: Good  Appetite:  Fair  Suicidal Ideation:  Denies Homicidal Ideation:  Denies  Psychiatric Specialty Exam: Review of Systems  Constitutional: Negative.  Negative for fever, chills, weight loss, malaise/fatigue and diaphoresis.  HENT: Negative.  Negative for congestion and sore throat.   Eyes: Negative.  Negative for blurred vision, double vision and photophobia.  Respiratory: Negative.  Negative for cough, shortness of breath and wheezing.   Cardiovascular: Negative.  Negative for chest pain, palpitations and PND.  Gastrointestinal: Negative.  Negative for heartburn, nausea, vomiting,  abdominal pain, diarrhea and constipation.  Genitourinary: Negative.   Musculoskeletal: Negative.  Negative for falls, joint pain and myalgias.  Skin: Negative.   Neurological: Negative.  Negative for dizziness, tingling, tremors, sensory change, speech change, focal weakness, seizures, loss of consciousness, weakness and headaches.  Endo/Heme/Allergies: Negative.  Negative for polydipsia. Does not bruise/bleed easily.  Psychiatric/Behavioral: Positive for depression. Negative for suicidal ideas, hallucinations, memory loss and substance abuse. The patient is nervous/anxious. The patient does not have insomnia.     Blood pressure 99/67, pulse 74, temperature 97.6 F (36.4 C), temperature source Oral, resp. rate 18, height 5' (1.524 m), weight 45.813 kg (101 lb), last menstrual period 02/10/2013.Body mass index is 19.73 kg/(m^2).  General Appearance: Casual  Eye Contact::  Fair  Speech:  Normal Rate  Volume:  Normal  Mood:  Anxious and Depressed  Affect:  Congruent  Thought Process:  Coherent  Orientation:  Full (Time, Place, and Person)  Thought Content:  WDL  Suicidal Thoughts:  No  Homicidal Thoughts:  No  Memory:  Immediate;   Fair Recent;   Fair Remote;   Fair  Judgement:  Fair  Insight:  Fair  Psychomotor Activity:  Decreased  Concentration:  Fair  Recall:  Fair  Akathisia:  No  Handed:  Right  AIMS (if indicated):     Assets:  Resilience Social Support  Sleep:  Number of Hours: 6.75   Current Medications: Current Facility-Administered Medications  Medication Dose Route Frequency  Provider Last Rate Last Dose  . acetaminophen (TYLENOL) tablet 650 mg  650 mg Oral Q6H PRN Nehemiah Settle, MD      . alum & mag hydroxide-simeth (MAALOX/MYLANTA) 200-200-20 MG/5ML suspension 30 mL  30 mL Oral Q4H PRN Nehemiah Settle, MD      . Chlorhexidine Gluconate Cloth 2 % PADS 6 each  6 each Topical Q0600 Renae Fickle, MD   6 each at 02/17/13 1931  . clindamycin  (CLEOCIN) capsule 300 mg  300 mg Oral TID Renae Fickle, MD   300 mg at 02/18/13 1127  . feeding supplement (ENSURE COMPLETE) (ENSURE COMPLETE) liquid 237 mL  237 mL Oral BID BM Lavena Bullion, RD   237 mL at 02/17/13 2032  . hydrocortisone (CORTEF) tablet 5 mg  5 mg Oral BID Renae Fickle, MD   5 mg at 02/18/13 1127  . lamoTRIgine (LAMICTAL) tablet 25 mg  25 mg Oral Daily Nanine Means, NP   25 mg at 02/18/13 1126  . magnesium hydroxide (MILK OF MAGNESIA) suspension 30 mL  30 mL Oral Daily PRN Nehemiah Settle, MD   30 mL at 02/17/13 1001  . mupirocin ointment (BACTROBAN) 2 %   Nasal BID Renae Fickle, MD   1 application at 02/17/13 1800  . QUEtiapine (SEROQUEL) tablet 12.5 mg  12.5 mg Oral TID PRN Verne Spurr, PA-C      . QUEtiapine (SEROQUEL) tablet 50 mg  50 mg Oral QHS Verne Spurr, PA-C      . thyroid (ARMOUR) tablet 120 mg  120 mg Oral QAC breakfast Nehemiah Settle, MD   120 mg at 02/18/13 0700    Lab Results:  No results found for this or any previous visit (from the past 48 hour(s)).  Physical Findings: AIMS: Facial and Oral Movements Muscles of Facial Expression: None, normal Lips and Perioral Area: None, normal Jaw: None, normal Tongue: None, normal,Extremity Movements Upper (arms, wrists, hands, fingers): None, normal Lower (legs, knees, ankles, toes): None, normal, Trunk Movements Neck, shoulders, hips: None, normal, Overall Severity Severity of abnormal movements (highest score from questions above): None, normal Incapacitation due to abnormal movements: None, normal Patient's awareness of abnormal movements (rate only patient's report): No Awareness, Dental Status Current problems with teeth and/or dentures?: No Does patient usually wear dentures?: No  CIWA:    COWS:     Treatment Plan Summary: Daily contact with patient to assess and evaluate symptoms and progress in treatment Medication management  Plan:  Review of chart, vital signs,  medications, and notes. 1. Continue medication as written with the following changes. 2. D/C Lexapro. 3. Restart Seroquel at a lower dose, 50mg  at hs, and 12.5 po q 8 hrs prn anxiety. 4. Will continue to follow.   Medical Decision Making Problem Points:  Established problem, stable/improving (1) and Review of psycho-social stressors (1) Data Points:  Review of new medications or change in dosage (2)  I certify that inpatient services furnished can reasonably be expected to improve the patient's condition.  Rona Ravens. [place] RPAC 1:40 PM 02/18/2013  Reviewed the information documented and agree with the treatment plan.  Amonie Wisser,JANARDHAHA R. 02/18/2013 3:17 PM

## 2013-02-18 NOTE — Progress Notes (Addendum)
D: Pt reports a decrease in her anxiety level with the use of Seroquel. Pt was pleasant and reading in bed when greeted by Clinical research associate. Pt denied any SI/HI/AVH. She actively participated in group this evening. She reports eating well at dinner, and therefore declined her scheduled Ensure for tonight.  A: Writer administered scheduled medications to pt. Continued support and availability as needed was extended to this pt. Staff continue to monitor pt with q31min checks.  R: No adverse drug reactions noted. Pt receptive to treatment. Pt remains safe at this time.

## 2013-02-18 NOTE — Progress Notes (Signed)
Patient ID: Tina [name], female   DOB: 10-Apr-1970, 42 y.o.   MRN: [id number] D- Patient reports she slept well and that her appetite is improving.  Her energy level is low and she rates her depression at 7/10.  She denies thoughts of self harm.  Patient says she woke this am feeling anxious and tearful.  Says this is the first time in 3 days this has happened and she started lexapro yesterday.  She was having this problem when she was taking prozac that was stopped  three days ago.  She declined to take lexapro until talking about it with MD.  She initially refused bactoban to back but then agreed to use it with help.  Sores  on back are not open or fluid filled.  They appear to be improving.

## 2013-02-18 NOTE — [facility] Suicide Risk Assessment (Signed)
[facility] Adult Inpatient Family/Significant Other Suicide Prevention Education  Suicide Prevention Education:   Patient Refusal for Family/Significant Other Suicide Prevention Education: The patient has refused to provide written consent for family/significant other to be provided Family/Significant Other Suicide Prevention Education during admission and/or prior to discharge.  Physician notified.  CSW provided suicide prevention information with patient.    The suicide prevention education provided includes the following:  Suicide risk factors  Suicide prevention and interventions  National Suicide Hotline telephone number  [facility] assessment telephone number  [facility] Emergency Assistance 911  [facility] and/or Residential Mobile Crisis Unit telephone number   [name], [place] 02/18/2013 2:08 PM

## 2013-02-18 NOTE — Progress Notes (Signed)
Recreation Therapy Notes  Date: 12.08.2014 Time: 3:00pm Location: [address] Dayroom  Group Topic: Wellness  Goal Area(s) Addresses:  Patient will define components of whole wellness. Patient will verbalize benefit to self of whole wellness.  Behavioral Response:  Appropriate   Intervention: Mind Map  Activity: Patients created a group flow chart relating to wellness and what makes up wellness. Patients were then asked to identify what they do to invest in each part of wellness.    Education: Wellness, Discharge Planning  Education Outcome: Acknowledges Education   Clinical Observations/Feedback: Patient presented with flat, withdrawn affect. Patient made no contributions to group discussion and was observed to stare blankly at the floor in front of her for entire group session.   Marykay Lex Macey Wurtz, LRT/CTRS  Jearl Klinefelter 02/18/2013 4:17 PM

## 2013-02-18 NOTE — Tx Team (Signed)
Interdisciplinary Treatment Plan Update (Adult)  Date: 02/18/2013  Time Reviewed:  9:45 AM  Progress in Treatment: Attending groups: Yes Participating in groups:  Yes Taking medication as prescribed:  Yes Tolerating medication:  Yes Family/Significant othe contact made: CSW will make attempts Patient understands diagnosis:  Yes Discussing patient identified problems/goals with staff:  Yes Medical problems stabilized or resolved:  Yes Denies suicidal/homicidal ideation: Yes Issues/concerns per patient self-inventory:  Yes Other:  New problem(s) identified: N/A  Discharge Plan or Barriers: CSW assessing for appropriate referrals.  Reason for Continuation of Hospitalization: Anxiety Depression Medication Stabilization  Comments: N/A  Estimated length of stay: 2-3 days  For review of initial/current patient goals, please see plan of care.  Attendees: Patient:     Family:     Physician:  Dr. Javier Glazier 02/18/2013 10:43 AM   Nursing:   Neill Loft, RN 02/18/2013 10:43 AM   Clinical Social Worker:  [name], LCSW 02/18/2013 10:43 AM   Other: Verne Spurr, PA 02/18/2013 10:43 AM   Other:  Elizbeth Squires, care coordination 02/18/2013 10:43 AM   Other:  Linton Rump, RN 02/18/2013 10:43 AM   Other:  Quintella Reichert, RN 02/18/2013 10:43 AM   Other:    Other:    Other:    Other:    Other:    Other:     Scribe for Treatment Team:   Carmina Miller, 02/18/2013 10:43 AM

## 2013-02-18 NOTE — Progress Notes (Signed)
Adult Psychoeducational Group Note  Date:  02/18/2013 Time:  9:16 PM  Group Topic/Focus:  Wrap-Up Group:   The focus of this group is to help patients review their daily goal of treatment and discuss progress on daily workbooks.  Participation Level:  Active  Participation Quality:  Appropriate  Affect:  Appropriate  Cognitive:  Appropriate  Insight: Appropriate  Engagement in Group:  Improving  Modes of Intervention:  Support  Additional Comments:  Pt stated that one thing she learned today is that her mother is actually trying to help her  And to be a good support system.  Harshita Bernales 02/18/2013, 9:16 PM

## 2013-02-18 NOTE — [facility] Group Notes (Signed)
[facility] LCSW Group Therapy  02/18/2013  1:15 PM   Type of Therapy:  Group Therapy  Participation Level:  Active  Participation Quality:  Attentive, Sharing and Supportive  Affect:  Depressed and Flat  Cognitive:  Alert and Oriented  Insight:  Developing/Improving and Engaged  Engagement in Therapy:  Developing/Improving and Engaged  Modes of Intervention:  Clarification, Confrontation, Discussion, Education, Exploration, Limit-setting, Orientation, Problem-solving, Rapport Building, Dance movement psychotherapist, Socialization and Support  Summary of Progress/Problems: Pt identified obstacles faced currently and processed barriers involved in overcoming these obstacles. Pt identified steps necessary for overcoming these obstacles and explored motivation (internal and external) for facing these difficulties head on. Pt further identified one area of concern in their lives and chose a goal to focus on for today.  Pt shared that her biggest obstacle is finances and making ends meet.  Pt didn't want to elaborate further.  Pt appeared to actively listen to group discussion.    [name], LCSW 02/18/2013 3:09 PM

## 2013-02-18 NOTE — [facility] Group Notes (Signed)
[facility] LCSW Aftercare Discharge Planning Group Note   02/18/2013 8:45 AM  Participation Quality:  Alert and Appropriate   Mood/Affect:  Flat and Depressed  Depression Rating:  5  Anxiety Rating:  5  Thoughts of Suicide:  Pt denies SI/HI  Will you contract for safety?   Yes  Current AVH:  Pt denies  Plan for Discharge/Comments:  Pt attended discharge planning group and actively participated in group.  CSW provided pt with today's workbook.  Pt initially asked to "pass" and not share in group.  CSW encouraged pt to share.  Pt states that she is not doing well today due to being up and down and crying this morning with increased anxiety.  Pt states that she follows up with [facility] for medication management.  No further needs voiced by pt at this time.    Transportation Means: Pt reports access to transportation  Supports: No supports mentioned at this time  [name], LCSW 02/18/2013 9:59 AM

## 2013-02-19 MED ORDER — MIRTAZAPINE 15 MG PO TABS
7.5000 mg | ORAL_TABLET | Freq: Every day | ORAL | Status: DC
  Administered 2013-02-19: 7.5 mg via ORAL
  Filled 2013-02-19: qty 1
  Filled 2013-02-19: qty 2
  Filled 2013-02-19: qty 1

## 2013-02-19 NOTE — [facility] Group Notes (Signed)
[facility] LCSW Group Therapy  02/19/2013  1:15 PM   Type of Therapy:  Group Therapy  Participation Level:  Active  Participation Quality:  Attentive, Sharing and Supportive  Affect:  Depressed and Tearful  Cognitive:  Alert and Oriented  Insight:  Developing/Improving and Engaged  Engagement in Therapy:  Developing/Improving and Engaged  Modes of Intervention:  Clarification, Confrontation, Discussion, Education, Exploration, Limit-setting, Orientation, Problem-solving, Rapport Building, Dance movement psychotherapist, Socialization and Support  Summary of Progress/Problems: The topic for group therapy was feelings about diagnosis.  Pt actively participated in group discussion on their past and current diagnosis and how they feel towards this.  Pt also identified how society and family members judge them, based on their diagnosis as well as stereotypes and stigmas.   Pt shared that she goes back and forth between accepting her diagnosis and treating it appropriately and ignoring her diagnosis and pretending she doesn't have it.  Pt explained that she took herself off her medication in April and was IVC'd here in June.  Pt states that she knows she needs her medication but gets frustrated with the process of constantly having to change them and having to be on them.  Pt expressed feelings of hopelessness, that things will never get better.  Pt actively participated and was engaged in group discussion.    [name], LCSW 02/19/2013 2:54 PM

## 2013-02-19 NOTE — Progress Notes (Addendum)
D:  Patient's self inventory sheet, patient has fair sleep, improving appetite, low energy level, poor attention span.  Rated depression, hopeless, anxiety #10.  Denied SI.  Rash rash on back.  No discharge plans.  No problems taking meds after discharge. A:  Medications administered per MD orders.  Emotional support and encouragement given patient. R:  Denied SI and HI.  Denied A/V hallucinations.  Denied pain.  Will continue to monitor patient for safety with 15 minute checks.  Safety maintained. Patient very tearful, depressed, anxious this morning.  Emotional support and encouragement continually given to patient this morning.

## 2013-02-19 NOTE — Progress Notes (Signed)
[facility] MD Progress Note  02/19/2013 10:06 AM Tina [name]  MRN:  [id number]  Subjective:  Patient was in bed reading.  She stated her sleep was interrupted with bad dreams and she woke up crying, feels very depressed and tearful.  Appetite has improved and she is making her self eat, anxiety is much improved.  She requests something for her depression, Remeron added at bedtime to help her depression without increasing her anxiety.  Diagnosis:   DSM5:  Axis I: Bipolar, Depressed Axis II: Deferred Axis III:  Past Medical History  Diagnosis Date  . History of chicken pox   . Hypothyroidism   . Genital warts   . Depression   . Bipolar 2 disorder   . Adrenal insufficiency   . Acne   . Cellulitis   . Hx of Lyme disease   . Headache    Axis IV: other psychosocial or environmental problems, problems related to social environment and problems with primary support group Axis V: 41-50 serious symptoms  ADL's:  Intact  Sleep: Fair  Appetite:  Good  Suicidal Ideation:  Plan:  vague Intent:  none Means:  none Homicidal Ideation:  Denies   Psychiatric Specialty Exam: Review of Systems  Constitutional: Negative.   HENT: Negative.   Eyes: Negative.   Cardiovascular: Negative.   Gastrointestinal: Negative.   Genitourinary: Negative.   Musculoskeletal: Negative.   Skin: Negative.   Neurological: Negative.   Endo/Heme/Allergies: Negative.   Psychiatric/Behavioral: Positive for depression. The patient is nervous/anxious.     Blood pressure 117/78, pulse 76, temperature 97.6 F (36.4 C), temperature source Oral, resp. rate 18, height 5' (1.524 m), weight 45.813 kg (101 lb), last menstrual period 02/10/2013.Body mass index is 19.73 kg/(m^2).  General Appearance: Casual  Eye Contact::  Fair  Speech:  Normal Rate  Volume:  Normal  Mood:  Anxious and Depressed  Affect:  Congruent  Thought Process:  Coherent  Orientation:  Full (Time, Place, and Person)  Thought Content:  WDL   Suicidal Thoughts:  Yes.  without intent/plan  Homicidal Thoughts:  No  Memory:  Immediate;   Fair Recent;   Fair Remote;   Fair  Judgement:  Fair  Insight:  Fair  Psychomotor Activity:  Decreased  Concentration:  Fair  Recall:  Fair  Akathisia:  No  Handed:  Right  AIMS (if indicated):     Assets:  Resilience Social Support  Sleep:  Number of Hours: 6   Current Medications: Current Facility-Administered Medications  Medication Dose Route Frequency Provider Last Rate Last Dose  . acetaminophen (TYLENOL) tablet 650 mg  650 mg Oral Q6H PRN Nehemiah Settle, MD      . alum & mag hydroxide-simeth (MAALOX/MYLANTA) 200-200-20 MG/5ML suspension 30 mL  30 mL Oral Q4H PRN Nehemiah Settle, MD      . Chlorhexidine Gluconate Cloth 2 % PADS 6 each  6 each Topical Q0600 Renae Fickle, MD   6 each at 02/17/13 1931  . clindamycin (CLEOCIN) capsule 300 mg  300 mg Oral TID Renae Fickle, MD   300 mg at 02/19/13 4098  . feeding supplement (ENSURE COMPLETE) (ENSURE COMPLETE) liquid 237 mL  237 mL Oral BID BM Lavena Bullion, RD   237 mL at 02/18/13 1607  . hydrocortisone (CORTEF) tablet 5 mg  5 mg Oral BID Renae Fickle, MD   5 mg at 02/19/13 1191  . lamoTRIgine (LAMICTAL) tablet 25 mg  25 mg Oral Daily Nanine Means, NP   25  mg at 02/19/13 9147  . magnesium hydroxide (MILK OF MAGNESIA) suspension 30 mL  30 mL Oral Daily PRN Nehemiah Settle, MD   30 mL at 02/17/13 1001  . mirtazapine (REMERON) tablet 7.5 mg  7.5 mg Oral QHS Nanine Means, NP      . mupirocin ointment (BACTROBAN) 2 %   Nasal BID Renae Fickle, MD      . QUEtiapine (SEROQUEL) tablet 12.5 mg  12.5 mg Oral TID PRN Verne Spurr, PA-C   12.5 mg at 02/19/13 0829  . QUEtiapine (SEROQUEL) tablet 50 mg  50 mg Oral QHS Verne Spurr, PA-C   50 mg at 02/18/13 2103  . thyroid (ARMOUR) tablet 120 mg  120 mg Oral QAC breakfast Nehemiah Settle, MD   120 mg at 02/19/13 0800    Lab Results: No results  found for this or any previous visit (from the past 48 hour(s)).  Physical Findings: AIMS: Facial and Oral Movements Muscles of Facial Expression: None, normal Lips and Perioral Area: None, normal Jaw: None, normal Tongue: None, normal,Extremity Movements Upper (arms, wrists, hands, fingers): None, normal Lower (legs, knees, ankles, toes): None, normal, Trunk Movements Neck, shoulders, hips: None, normal, Overall Severity Severity of abnormal movements (highest score from questions above): None, normal Incapacitation due to abnormal movements: None, normal Patient's awareness of abnormal movements (rate only patient's report): No Awareness, Dental Status Current problems with teeth and/or dentures?: No Does patient usually wear dentures?: No  CIWA:  CIWA-Ar Total: 4 COWS:  COWS Total Score: 2  Treatment Plan Summary: Daily contact with patient to assess and evaluate symptoms and progress in treatment Medication management  Plan:  Review of chart, vital signs, medications, and notes. 1-Individual and group therapy 2-Medication management for depression and anxiety:  Medications reviewed with the patient and Remeron added at bedtime 3-Coping skills for depression, anxiety 4-Continue crisis stabilization and management 5-Address health issues--monitoring vital signs, stable 6-Treatment plan in progress to prevent relapse of depression and anxiety  Medical Decision Making Problem Points:  Established problem, stable/improving (1) and Review of psycho-social stressors (1) Data Points:  Review of new medications or change in dosage (2)  I certify that inpatient services furnished can reasonably be expected to improve the patient's condition.   Nanine Means, PMH-NP 02/19/2013, 10:06 AM  Reviewed the information documented and agree with the treatment plan.  Deivi Huckins,JANARDHAHA R. 02/19/2013 3:44 PM

## 2013-02-19 NOTE — Progress Notes (Signed)
Adult Psychoeducational Group Note  Date:  02/19/2013 Time:  6:21 PM  Group Topic/Focus:  Recovery Goals:   The focus of this group is to identify appropriate goals for recovery and establish a plan to achieve them.   Additional Comments:  Pt did not attend recovery group this morning.   Kinzey Sheriff A 02/19/2013, 6:21 PM

## 2013-02-19 NOTE — Progress Notes (Signed)
The focus of this group is to educate the patient on the purpose and policies of crisis stabilization and provide a format to answer questions about their admission.  The group details unit policies and expectations of patients while admitted.  Patient did not attend group. 

## 2013-02-19 NOTE — Progress Notes (Signed)
Recreation Therapy Notes  Animal-Assisted Activity/Therapy (AAA/T) Program Checklist/Progress Notes Patient Eligibility Criteria Checklist & Daily Group note for Rec Tx Intervention  Date: 12.09.2014  Time: 2:45pm Location: [address]    AAA/T Program Assumption of Risk Form signed by Patient/ or Parent Legal Guardian yes  Patient is free of allergies or sever asthma  yes  Patient reports no fear of animals yes  Patient reports no history of cruelty to animals yes  Patient understands his/her participation is voluntary yes  Patient washes hands before animal contact yes  Patient washes hands after animal contact yes  Behavioral Response: Appropriate  Education: Hand Washing, Appropriate Animal Interaction   Education Outcome: Acknowledges understanding.   Tina [name] Tina [name], LRT/CTRS  Tina [name] 02/19/2013 4:15 PM

## 2013-02-19 NOTE — Progress Notes (Signed)
Patient ID: EYVA CALIFANO, female   DOB: Jul 11, 1970, 42 y.o.   MRN: [id number]  D: Pt denies SI/HI/AVH. Pt is pleasant and cooperative. Pt stated she was just depressed, just been sleeping most of the day. Pt forwards little.   A: Pt was offered support and encouragement. Pt was given scheduled medications. Pt was encourage to attend groups. Q 15 minute checks were done for safety.   R: Pt is taking medication.Pt receptive to treatment and safety maintained on unit.

## 2013-02-20 DIAGNOSIS — F3110 Bipolar disorder, current episode manic without psychotic features, unspecified: Principal | ICD-10-CM

## 2013-02-20 MED ORDER — HYDROCORTISONE 5 MG PO TABS: 5.0000 mg | ORAL_TABLET | Freq: Two times a day (BID) | ORAL | Status: DC

## 2013-02-20 MED ORDER — MUPIROCIN 2 % EX OINT: TOPICAL_OINTMENT | Freq: Two times a day (BID) | CUTANEOUS | Status: DC

## 2013-02-20 MED ORDER — MIRTAZAPINE 7.5 MG PO TABS: 7.5000 mg | ORAL_TABLET | Freq: Every day | ORAL | Status: DC

## 2013-02-20 MED ORDER — QUETIAPINE 12.5 MG HALF TABLET: 12.5000 mg | ORAL_TABLET | Freq: Three times a day (TID) | ORAL | Status: DC | PRN

## 2013-02-20 MED ORDER — LAMOTRIGINE 25 MG PO TABS: 25.0000 mg | ORAL_TABLET | Freq: Every day | ORAL | Status: DC

## 2013-02-20 MED ORDER — THYROID 120 MG PO TABS: 120.0000 mg | ORAL_TABLET | Freq: Every day | ORAL | Status: DC

## 2013-02-20 MED ORDER — CLINDAMYCIN HCL 300 MG PO CAPS: 300.0000 mg | ORAL_CAPSULE | Freq: Three times a day (TID) | ORAL | Status: DC

## 2013-02-20 MED ORDER — QUETIAPINE FUMARATE 50 MG PO TABS: 50.0000 mg | ORAL_TABLET | Freq: Every day | ORAL | Status: DC

## 2013-02-20 NOTE — Discharge Summary (Signed)
Physician Discharge Summary Note  Patient:  Tina [name] is an 42 y.o., female MRN:  [id number] DOB:  09/25/1970 Patient phone:  [phone] (home)  Patient address:   [address] [place] [address],   Date of Admission:  02/14/2013 Date of Discharge: 02/15/13  Reason for Admission: Mood stabilization  Discharge Diagnoses: Principal Problem:   Bipolar disorder, unspecified Active Problems:   Depression   Unspecified hypothyroidism   Bipolar I disorder, most recent episode (or current) manic   Adrenal insufficiency   Acne   Cellulitis   Headache  Review of Systems  Constitutional: Negative.   HENT: Negative.   Eyes: Negative.   Respiratory: Negative.   Cardiovascular: Negative.   Gastrointestinal: Negative.   Genitourinary: Negative.   Musculoskeletal: Negative.   Skin: Negative.   Neurological: Negative.   Endo/Heme/Allergies: Negative.   Psychiatric/Behavioral: Positive for depression (Stabilized with medication prior to discharge). Negative for suicidal ideas, hallucinations, memory loss and substance abuse. The patient is nervous/anxious (Stabilized with medication prior to discharge ) and has insomnia ( Stabilized with medication prior to discharge).     DSM5: Schizophrenia Disorders:  NA Obsessive-Compulsive Disorders:  NA Trauma-Stressor Disorders:  NA Substance/Addictive Disorders:  NA Depressive Disorders:  Bipolar affective disorder, manic episode, most recent  Axis Diagnosis:  AXIS I:  Bipolar affective disorder, manic episode, most recent AXIS II:  Deferred AXIS III:   Past Medical History  Diagnosis Date  . History of chicken pox   . Hypothyroidism   . Genital warts   . Depression   . Bipolar 2 disorder   . Adrenal insufficiency   . Acne   . Cellulitis   . Hx of Lyme disease   . Headache    AXIS IV:  Chronic health problems AXIS V:  63  Level of Care:  OP  Hospital Course:  Tina [name] is a 42 year old WF who comes  in to [facility] after presenting to the [facility] reporting that her PCP Dr. Consuelo Pandy suggested that she come in to have her symptoms evaluated. She has a history of bipolar disorder and was admitted to [facility] in July of this year in a manic state. She did not continue her medications as she felt that they were not needed. She was resistant to treatment at the time and a forced medication order was written. She did respond well at the time, but clearly has decompensated.  After admission assessments/evaluations, it was determined that Tina [name] is depressed and will need some treatment regimen to help combat and re-stabilize her current depressive mood symptoms. She was ordered and received Seroquel 50 mg Q bedtime for mood control, Seroquel 12.5 mg tid for anxiety issues and Mirtazapine 7.5 mg Q bedtime for sleep/depression. She also was enrolled in group counseling sessions and activities in which she participated actively on daily basis. As her treatment regimen progressed, it was determined that patient is responding well to her treatment plan/regimen. This is evidenced by her daily reports of improved mood, reduction of symptoms and presentation of good affects and eye contact. Besides treatment for her depressive mood symptoms, Tina [name] also received medication management and monitoring for her other medical issues and concerns that she presented. She tolerated her treatment regimen without any significant adverse effects and or reactions reported.  Patient attended treatment team meeting this a.m and met with treatment team staff. Her reason for admission, present symptoms, treatment plan, response to treatment and discharge plans discussed with patient. Anshi endorsed that  her symptoms have improved. Pt also stated that she is stable for discharge to pursue the next phase of her psychiatric care on an outpatient basis.  It was agreed upon that patient will continue psychiatric care on outpatient basis to maintain  stability. She will follow-up care at the [facility] here in [place], [place] with Lauris Poag Thursdays of every week. The address and contact information for this clinic provided for patient.  As to what patient learned from being in this hospital, she reported that from this hospital stay she had learned to take care of herself by staying on her medications, report any possible adverse effects to her outpatient provider and keep her appointments as scheduled with her psychiatrist. Upon discharge, patient adamantly denies suicidal, homicidal ideations, auditory, visual hallucinations and or delusional thought. She left [facility] with all personal belongings via personal arranged transportation in no apparent distress.  Consults:  psychiatry  Significant Diagnostic Studies:  labs: CBC with diff, CMP, UDS, Toxicology tests, TSH, U/A  Discharge Vitals:   Blood pressure 106/69, pulse 64, temperature 98 F (36.7 C), temperature source Oral, resp. rate 20, height 5' (1.524 m), weight 45.813 kg (101 lb), last menstrual period 02/10/2013. Body mass index is 19.73 kg/(m^2). Lab Results:   No results found for this or any previous visit (from the past 72 hour(s)).  Physical Findings: AIMS: Facial and Oral Movements Muscles of Facial Expression: None, normal Lips and Perioral Area: None, normal Jaw: None, normal Tongue: None, normal,Extremity Movements Upper (arms, wrists, hands, fingers): None, normal Lower (legs, knees, ankles, toes): None, normal, Trunk Movements Neck, shoulders, hips: None, normal, Overall Severity Severity of abnormal movements (highest score from questions above): None, normal Incapacitation due to abnormal movements: None, normal Patient's awareness of abnormal movements (rate only patient's report): No Awareness, Dental Status Current problems with teeth and/or dentures?: No Does patient usually wear dentures?: No  CIWA:  CIWA-Ar Total: 4 COWS:  COWS Total Score:  2  Psychiatric Specialty Exam: See Psychiatric Specialty Exam and Suicide Risk Assessment completed by Attending Physician prior to discharge.  Discharge destination:  Home  Is patient on multiple antipsychotic therapies at discharge:  No   Has Patient had three or more failed trials of antipsychotic monotherapy by history:  No  Recommended Plan for Multiple Antipsychotic Therapies: NA     Medication List    STOP taking these medications       dronabinol 10 MG capsule  Commonly known as:  MARINOL     liothyronine 25 MCG tablet  Commonly known as:  CYTOMEL      TAKE these medications     Indication   clindamycin 300 MG capsule  Commonly known as:  CLEOCIN  Take 1 capsule (300 mg total) by mouth 3 (three) times daily. For infection   Indication:  For infection     hydrocortisone 5 MG tablet  Commonly known as:  CORTEF  Take 1 tablet (5 mg total) by mouth 2 (two) times daily. For adrenal insufficiency   Indication:  Decreased Function of the Adrenal Gland     lamoTRIgine 25 MG tablet  Commonly known as:  LAMICTAL  Take 1 tablet (25 mg total) by mouth daily. For mood stabilization   Indication:  Manic-Depression, Mood stabilization     mirtazapine 7.5 MG tablet  Commonly known as:  REMERON  Take 1 tablet (7.5 mg total) by mouth at bedtime. For depression/sleep   Indication:  Trouble Sleeping, Major Depressive Disorder  mupirocin ointment 2 %  Commonly known as:  BACTROBAN  Place into the nose 2 (two) times daily. For skin infection   Indication:  Minor Skin Infection due to a Bacteria     QUEtiapine 12.5 mg Tabs tablet  Commonly known as:  SEROQUEL  Take 0.5 tablets (12.5 mg total) by mouth 3 (three) times daily as needed (anxiety).   Indication:  Bipolar 1 related anxiety     QUEtiapine 50 MG tablet  Commonly known as:  SEROQUEL  Take 1 tablet (50 mg total) by mouth at bedtime. For mood control   Indication:  Trouble Sleeping, Mood control     thyroid  120 MG tablet  Commonly known as:  ARMOUR  Take 1 tablet (120 mg total) by mouth daily before breakfast. For hypothyroidism   Indication:  Underactive Thyroid       Follow-up Information   Follow up with Lauris Poag [facility]. (Patient to contact Dr. Flora Lipps for follow up as she is only in the office on Thursday.)    Contact information:   [address] [place], [place]   [address]  [phone]     Follow-up recommendations:  Activity:  As tolerated Diet: As recommended by your primary care doctor. Keep all scheduled follow-up appointments as recommended.  Comments: Take all your medications as prescribed by your mental healthcare provider. Report any adverse effects and or reactions from your medicines to your outpatient provider promptly. Patient is instructed and cautioned to not engage in alcohol and or illegal drug use while on prescription medicines. In the event of worsening symptoms, patient is instructed to call the crisis hotline, 911 and or go to the nearest ED for appropriate evaluation and treatment of symptoms. Follow-up with your primary care provider for your other medical issues, concerns and or health care needs.   Total Discharge Time:  Greater than 30 minutes.  Signed: Sanjuana Kava, PMHNP, FNP-BC 02/20/2013, 11:43 AM  The patient was seen for psychiatric evaluation, suicide risk assessment and formulated treatment plan, after discussing the case with the treatment team made disposition and treatment plan. Reviewed the information documented and agree with the treatment plan.  Jontue Crumpacker,JANARDHAHA R. 02/21/2013 1:29 PM

## 2013-02-20 NOTE — [facility] Group Notes (Addendum)
[facility] LCSW Aftercare Discharge Planning Group Note   02/20/2013 2:44 PM    Participation Quality:  Appropraite  Mood/Affect:  Appropriate  Depression Rating:  5  Anxiety Rating:  5  Thoughts of Suicide:  No  Will you contract for safety?   NA  Current AVH:  No  Plan for Discharge/Comments:  Patient attended discharge planning group and actively participated in group.  She reports doing well and hopes to discharge home today.  She advised she will contact outpatient provider, Lauris Poag for follow up as Dr. Flora Lipps is only in the office on Thursday. CSW provided all participants with daily workbook.   Transportation Means: Patient has transportation.   Supports:  Patient has a support system.   Carlena Ruybal, Joesph July

## 2013-02-20 NOTE — Progress Notes (Signed)
Pt was discharged home today.  She denied any S/I H/I or A/V hallucinations.  She was given f/u appointment, rx, sample medications, hotline info booklet, and letter provided by the case manager.  She voiced understanding to all instructions provided.  She declined the need for smoking cessation materials.

## 2013-02-20 NOTE — [facility] Suicide Risk Assessment (Signed)
Suicide Risk Assessment  Discharge Assessment     Demographic Factors:  Adolescent or young adult and Caucasian  Mental Status Per Nursing Assessment::   On Admission:     Current Mental Status by Physician: Patient is calm, quiet and cooperative. Patient stated mood is good and affect is appropriate bright and foot. Patient has normal speech and thought process. Patient has no suicidal or homicidal ideation, intention or plan. Patient has no evidence of psychotic symptoms.  Loss Factors: NA  Historical Factors: Prior suicide attempts, Family history of mental illness or substance abuse and Impulsivity  Risk Reduction Factors:   Sense of responsibility to family, Religious beliefs about death, Employed, Living with another person, especially a relative, Positive social support, Positive therapeutic relationship and Positive coping skills or problem solving skills  Continued Clinical Symptoms:  Bipolar Disorder:   Depressive phase Depression:   Recent sense of peace/wellbeing  Cognitive Features That Contribute To Risk:  Polarized thinking    Suicide Risk:  Minimal: No identifiable suicidal ideation.  Patients presenting with no risk factors but with morbid ruminations; may be classified as minimal risk based on the severity of the depressive symptoms  Discharge Diagnoses:   AXIS I:  Bipolar, Manic AXIS II:  Deferred AXIS III:   Past Medical History  Diagnosis Date  . History of chicken pox   . Hypothyroidism   . Genital warts   . Depression   . Bipolar 2 disorder   . Adrenal insufficiency   . Acne   . Cellulitis   . Hx of Lyme disease   . Headache    AXIS IV:  other psychosocial or environmental problems, problems related to social environment and problems with primary support group AXIS V:  61-70 mild symptoms  Plan Of Care/Follow-up recommendations:  Activity:  As tolerated Diet:  Regular  Is patient on multiple antipsychotic therapies at discharge:  No    Has Patient had three or more failed trials of antipsychotic monotherapy by history:  No  Recommended Plan for Multiple Antipsychotic Therapies: NA  Zali Kamaka,JANARDHAHA R., and the 02/20/2013, 12:56 PM

## 2013-02-20 NOTE — Clinical Social Work Note (Signed)
Call from Dr. Verlon Au O'Neal's office advising of an appointment on Thursday, March 21, 2012 at 9:20 AM.  They will put patient on a cancellation list for an earlier appointment if one comes available.  Message left on patient's VM to call writer for appointment confirmation.  Dr. Marylene Buerger office was also asked to contact patient with appointment.

## 2013-02-20 NOTE — Progress Notes (Signed)
Adult Psychoeducational Group Note  Date:  02/19/2013 Time:  8:00 pm  Group Topic/Focus:  Wrap-Up Group:   The focus of this group is to help patients review their daily goal of treatment and discuss progress on daily workbooks.  Participation Level:  Did Not Attend   Modena Nunnery 02/20/2013, 12:10 AM

## 2013-02-25 NOTE — Progress Notes (Signed)
Patient Discharge Instructions:  After Visit Summary (AVS):   Faxed to:  02/25/13 Discharge Summary Note:   Faxed to:  02/25/13 Psychiatric Admission Assessment Note:   Faxed to:  02/25/13 Suicide Risk Assessment - Discharge Assessment:   Faxed to:  02/25/13 Faxed/Sent to the Next Level Care provider:  02/25/13 Faxed to [facility] @ [phone]  Jerelene Redden, 02/25/2013, 3:34 PM

## 2014-06-23 ENCOUNTER — Other Ambulatory Visit (HOSPITAL_COMMUNITY): Payer: BLUE CROSS/BLUE SHIELD | Admitting: Psychology

## 2014-06-23 ENCOUNTER — Encounter (HOSPITAL_COMMUNITY): Payer: Self-pay | Admitting: Psychology

## 2014-06-23 DIAGNOSIS — F1020 Alcohol dependence, uncomplicated: Secondary | ICD-10-CM | POA: Insufficient documentation

## 2014-06-23 DIAGNOSIS — F419 Anxiety disorder, unspecified: Secondary | ICD-10-CM | POA: Insufficient documentation

## 2014-06-23 DIAGNOSIS — Z8619 Personal history of other infectious and parasitic diseases: Secondary | ICD-10-CM

## 2014-06-23 DIAGNOSIS — F319 Bipolar disorder, unspecified: Secondary | ICD-10-CM | POA: Insufficient documentation

## 2014-06-23 DIAGNOSIS — F3110 Bipolar disorder, current episode manic without psychotic features, unspecified: Secondary | ICD-10-CM

## 2014-06-23 DIAGNOSIS — F1120 Opioid dependence, uncomplicated: Secondary | ICD-10-CM | POA: Insufficient documentation

## 2014-06-24 ENCOUNTER — Encounter (HOSPITAL_COMMUNITY): Payer: Self-pay | Admitting: Psychology

## 2014-06-24 DIAGNOSIS — F1021 Alcohol dependence, in remission: Secondary | ICD-10-CM | POA: Insufficient documentation

## 2014-06-24 DIAGNOSIS — F1020 Alcohol dependence, uncomplicated: Principal | ICD-10-CM

## 2014-06-24 NOTE — Progress Notes (Signed)
Tina [name] is a 44 y.o. female patient. Orientation to CD-IOP: The patient is a 44 yo divorced, Caucasian, female referred by [facility] in [place], [place]. She completed residential treatment to address her alcohol dependence late last week and met with me this morning for her orientation. The patient lives in [place] and shares joint custody of their 3 children (ages 64, 60 and 60 yo) with her ex-husband. She reported they separated in early 2013 and their divorce was finalized in June of 2014. The patient disclosed a long history of alcohol and drug use that began in her mid-teens. She began drinking at age 71 and most recently was drinking daily. The patient reported drinking 2-3 hard cider beers per day followed up with numerous shots of rum. She last drank on February 24th. The patient reported she smoked cannabis beginning at age 46 and it grew into a daily habit that consisted of 2-5 hits from a "one-hitter". She was once prescribed benzodiazepines, but admitted, "they didn't work for me" and she never sought them out. The patient admitted abusing Stadol, a narcotic which was prescribed for migraines, but she has not used that since 2013. 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  The patient stated she has responded well to the powerful regiment of anti-biotics that were prescribed after the diagnosis. The patient's first treatment episode occurred when she was 44 yo and she entered [facility], a treatment facility in [place], [place]. The patient reported she stayed sober over a year while enrolled at [facility]. She managed to stay sober from [phone] and attributed these years of sobriety to her engagement in [place]. In addition to her chemical dependency diagnoses, the patient also suffers from Bipolar Disorder. She reported that while her cannabis use generated the mania, the alcohol served to counter the mania and contributed to her  depression. The patient reported that she has struggled with this mood disorder for many years and became even more problematic after childbirth. Her fluctuating hormone levels and subsequent emotional imbalances were very powerful and she admitted, "having children was probably not good for me". She was born in the [place], but after her parents' divorced when (she was 53 years old), her mother took her 2 daughters and moved to [facility]. Her paternal grandmother was a full-blooded Native [place] and she believes the alcoholism came from this side of her family. Her parents both remarried and their spouses were, or are, alcoholics. She pointed out that her maternal uncle is a gambling addict. The patient reported the last 2 years have been very difficult as a single parent working and raising children. She and her ex-husband are good friends and work together to raise their children. The patient reported her PCP had prescribed her psych meds previous to her entry into [facility], but the medical director changed some of her medications and she would like a referral for medication management to a local psychiatrist upon her discharge. The orientation documentation was completed and the patient will return this afternoon to begin the CD-IOP.       Karan Ramnauth, LCAS

## 2014-06-24 NOTE — Progress Notes (Signed)
    Daily Group Progress Note  Program: CD-IOP   Group Time: 1-2:30 pm  Participation Level: Minimal  Behavioral Response: Appropriate  Type of Therapy: Psycho-education Group  Topic: Psycho-Ed: the first part of group was spent with a guest speaker. The speaker had graduated successfully from this program in June of 2014. She had struggled upon leaving and relapsed a number of times before fully committing to the [employer] of [facility]. She recounted her journey and described what she does every day and what has led to over 9 months of sobriety. Her talk was funny, sad and emotional at times. Her journey was compelling and group members were very attentive and asked good questions throughout this session.    Group Time: 2:45- 4pm  Participation Level: Active  Behavioral Response: Appropriate and Sharing  Type of Therapy: Process Group  Topic: Process/Graduation: the second half of group was spent in process. Members shared about challenges and issues that have arisen in early recovery. One member checked in with a new sobriety date of today. Her relapse over the weekend was diagrammed and group members provided good feedback. A new group member was present and she introduced herself and shared a little of her journey. As the session neared the end, a graduation ceremony was held in honor of a group member who was completing the program successfully today.   Summary: The patient was new to the group today. She listened intently to the guest speaker's story and made some good comments with other group members. In process, the patient introduced herself and explained she had just returned from 6 weeks at [facility] near [place]. Ironically, she had actually attended a 10 am AA meeting on Friday and met some of these ladies here in group at that time. She admitted that returning home after the time away had been somewhat overwhelming. The patient shared that being around her 3  children over the weekend had been very stressful. Her new fellow group members provided excellent feedback and she was provided with a list of the group members' phone numbers and encouraged to call them. The patient pointed out she did not know the graduating member, but wished her well and encouraged her not to become complacent. She responded well to this first group session and her sobriety date is 2/25.    Family Program: Family present? No   Name of family member(s):   UDS collected: No Results:  AA/NA attended?: YesFriday  Sponsor?: No, but she just got out of residential treatment   Oswell Say, LCAS

## 2014-06-25 ENCOUNTER — Other Ambulatory Visit (HOSPITAL_COMMUNITY): Payer: BLUE CROSS/BLUE SHIELD | Attending: Psychiatry | Admitting: Licensed Clinical Social Worker

## 2014-06-25 DIAGNOSIS — F319 Bipolar disorder, unspecified: Secondary | ICD-10-CM | POA: Diagnosis not present

## 2014-06-25 DIAGNOSIS — F1120 Opioid dependence, uncomplicated: Secondary | ICD-10-CM | POA: Diagnosis not present

## 2014-06-25 DIAGNOSIS — F1020 Alcohol dependence, uncomplicated: Secondary | ICD-10-CM | POA: Diagnosis not present

## 2014-06-25 DIAGNOSIS — F419 Anxiety disorder, unspecified: Secondary | ICD-10-CM | POA: Diagnosis not present

## 2014-06-27 ENCOUNTER — Other Ambulatory Visit (HOSPITAL_COMMUNITY): Payer: BLUE CROSS/BLUE SHIELD | Admitting: Licensed Clinical Social Worker

## 2014-06-27 ENCOUNTER — Encounter (HOSPITAL_COMMUNITY): Payer: Self-pay | Admitting: Medical

## 2014-06-27 ENCOUNTER — Encounter (HOSPITAL_COMMUNITY): Payer: Self-pay | Admitting: Licensed Clinical Social Worker

## 2014-06-27 DIAGNOSIS — F419 Anxiety disorder, unspecified: Secondary | ICD-10-CM | POA: Insufficient documentation

## 2014-06-27 DIAGNOSIS — F3160 Bipolar disorder, current episode mixed, unspecified: Secondary | ICD-10-CM | POA: Insufficient documentation

## 2014-06-27 DIAGNOSIS — F319 Bipolar disorder, unspecified: Secondary | ICD-10-CM | POA: Diagnosis not present

## 2014-06-27 DIAGNOSIS — F1190 Opioid use, unspecified, uncomplicated: Secondary | ICD-10-CM | POA: Insufficient documentation

## 2014-06-27 DIAGNOSIS — A6920 Lyme disease, unspecified: Secondary | ICD-10-CM | POA: Insufficient documentation

## 2014-06-27 DIAGNOSIS — F3175 Bipolar disorder, in partial remission, most recent episode depressed: Secondary | ICD-10-CM | POA: Insufficient documentation

## 2014-06-27 DIAGNOSIS — F1221 Cannabis dependence, in remission: Secondary | ICD-10-CM | POA: Insufficient documentation

## 2014-06-27 DIAGNOSIS — F1220 Cannabis dependence, uncomplicated: Secondary | ICD-10-CM

## 2014-06-27 DIAGNOSIS — E079 Disorder of thyroid, unspecified: Secondary | ICD-10-CM | POA: Insufficient documentation

## 2014-06-27 MED ORDER — DULOXETINE HCL 30 MG PO CPEP: 30.0000 mg | ORAL_CAPSULE | Freq: Every day | ORAL | Status: DC

## 2014-06-27 MED ORDER — ESCITALOPRAM OXALATE 20 MG PO TABS: 20.0000 mg | ORAL_TABLET | Freq: Every day | ORAL | Status: DC

## 2014-06-27 MED ORDER — PROPRANOLOL HCL 10 MG PO TABS: ORAL_TABLET | ORAL | Status: DC

## 2014-06-27 MED ORDER — ESCITALOPRAM OXALATE 20 MG PO TABS: ORAL_TABLET | ORAL | Status: DC
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
## 2014-06-30 ENCOUNTER — Other Ambulatory Visit (HOSPITAL_COMMUNITY): Payer: Self-pay | Admitting: Psychiatry

## 2014-06-30 ENCOUNTER — Telehealth (HOSPITAL_COMMUNITY): Payer: Self-pay

## 2014-06-30 ENCOUNTER — Other Ambulatory Visit (HOSPITAL_COMMUNITY): Payer: BLUE CROSS/BLUE SHIELD | Admitting: Licensed Clinical Social Worker

## 2014-06-30 ENCOUNTER — Encounter (HOSPITAL_COMMUNITY): Payer: Self-pay | Admitting: Licensed Clinical Social Worker

## 2014-06-30 DIAGNOSIS — L309 Dermatitis, unspecified: Secondary | ICD-10-CM

## 2014-06-30 DIAGNOSIS — F319 Bipolar disorder, unspecified: Secondary | ICD-10-CM | POA: Diagnosis not present

## 2014-06-30 DIAGNOSIS — F1020 Alcohol dependence, uncomplicated: Secondary | ICD-10-CM

## 2014-06-30 MED ORDER — DIPHENHYDRAMINE HCL 50 MG PO CAPS: 50.0000 mg | ORAL_CAPSULE | Freq: Three times a day (TID) | ORAL | Status: DC | PRN

## 2014-06-30 NOTE — Telephone Encounter (Signed)
Discontinue Doxycycline as patient has rash of hands, lips, nose and forehead

## 2014-06-30 NOTE — Progress Notes (Signed)
    Daily Group Progress Note  Program: CD-IOP   Group Time: 1-2:30  Participation Level: Active  Behavioral Response: Appropriate and Sharing  Type of Therapy: Process Group  Topic: After checking in, group members took turns sharing about recovery-related activities and challenges they faced since our last group meeting.   Group members were active during the discussion and many group members engaged in giving feedback to other group members. Two drug tests were returned. Four group members saw PA Tina [name].      Group Time: 2:45-4  Participation Level: Active  Behavioral Response: Appropriate and Sharing  Type of Therapy: Psycho-education Group  Topic: The second half of group focused on medications. The pharmacist Tina [name] from [facility] provided information on medications. The group was engaged, attentive and asked many questions. The group also reported on weekend recovery plans.    Summary: The patient met with her sponsor on Wednesday night and went to a meeting with her.  She started reading the "Big Book" again from the beginning as suggested by her sponsor. The patient reports she will see her children some this weekend. She and her x-husband are able to work out visitation between her and the children so it doesn't add to her anxiety or become too overwhelming for her.  She received positive feedback from other group members on moving slowly with visitation of her children. She reports that her anxiety remains the same and is looking forward to seeing the PA today to discuss her medications. She gained insightful information about medications from the [facility] pharmacist today and reported she enjoyed the presentation. Her sobriety date remains 2/25.   Family Program: Family present? No   Name of family member([name]):   UDS collected: No Results:   AA/NA attended?: YesWednesday and Thursday  Sponsor?: Yes   Tina [name], Licensed Cli

## 2014-06-30 NOTE — Telephone Encounter (Signed)
Medication management - Patient in CDIOP and requested nurse look at her red blistered hands, red nose, lips and spots on her forehead.  Patient thought possibly having a reaction to Inderal she started on 06/27/14.  Patient denied thinking the reaction could be related to her Lamictal as states "I've been taking that for years" with no problems.  Requested Dr. Lucianne MussKumar view this reaction and speak to patient.  Dr. Lucianne MussKumar reviewed patient's current medications and concluded with patient reaction more likely due to doxycyline which she had been taking less than 2 weeks.  Patient to begin Benadryl 50mg , one every 8 hours as needed for reaction and to discontinue her doxycyline.  Patient agreed to follow up with a visit to her local emergency department if rash continued or worsened to rule out Trudie BucklerSteven Johnson Syndrome possibly related to Lamictal.  Patient to take a provided Benadryl  25mg  capsule and hydrocortisone 0.5% cream at this time from pharmacy.  Dr. Lucianne MussKumar requested she only take the provided 25mg  currently so she would not be too tired to drive.  Warned patient about sedative affect of Benadryl and patient agreed to be prepared when takes, not to drive then and to be careful with ambulation.  Dr. Lucianne MussKumar informed patient rash and areas should improve within a few days but patient to follow up if did not.  Patient agreed with plan, took 25mg  Benadryl and used cream to decrease burning and itching discomfort with current areas of reaction. Patient agreed to touch base with this nurse on 07/01/14 for an update on her status.  Again patient reminded to follow up for more critical evaluation if rash intensified.

## 2014-07-02 ENCOUNTER — Encounter (HOSPITAL_COMMUNITY): Payer: Self-pay | Admitting: Licensed Clinical Social Worker

## 2014-07-02 ENCOUNTER — Other Ambulatory Visit (HOSPITAL_COMMUNITY): Payer: BLUE CROSS/BLUE SHIELD | Admitting: Psychology

## 2014-07-02 DIAGNOSIS — F3110 Bipolar disorder, current episode manic without psychotic features, unspecified: Secondary | ICD-10-CM

## 2014-07-02 DIAGNOSIS — Z8619 Personal history of other infectious and parasitic diseases: Secondary | ICD-10-CM

## 2014-07-02 DIAGNOSIS — F1020 Alcohol dependence, uncomplicated: Secondary | ICD-10-CM

## 2014-07-02 DIAGNOSIS — F319 Bipolar disorder, unspecified: Secondary | ICD-10-CM | POA: Diagnosis not present

## 2014-07-02 NOTE — Progress Notes (Signed)
    Daily Group Progress Note  Program: CD-IOP   Group Time: 1-2:30  Participation Level: Active  Behavioral Response: Appropriate and Sharing  Type of Therapy: Process Group  Topic: After checking in, group members took turns sharing about recovery-related activities and challenges they faced since our last group meeting on Friday.   Group members were active during the discussion and many group members engaged in giving feedback to other group members. Two drug tests were returned.      Group Time: 2:45-4  Participation Level: Active  Behavioral Response: Appropriate and Sharing  Type of Therapy: Psycho-education Group  Topic: The second part of group focused on family history of addiction/alcoholism. Patients were given a genogram template to fill out their family tree. The genogram included siblings, parents, aunts, uncles, grandparents, great-grandparents, great uncles and aunts. They were also asked to identify family members with mental illness and addiction/alcoholism. After the patients completed the genogram template they transferred the genogram to poster board. Each patient shared their genogram with the other group members, explaining their family traits, mental illness and addiction/alcoholism. All group members took this activity seriously and appeared open and honest. Group members reported that doing this activity gave more insight to other group members.   Summary: The patient presented with a burning rash. I took the patient to see the RN who took her to see Dr. Dwyane Dee. It was determined the rash was being caused by an antibiotic. She was given some cream and suggested to stop taking her antibiotic. She should also call her PCP for another antibiotic prescription. The patient got back her drug test and she tested positive for alcohol. The patient reports that she has not drank since 2/25. She was very concerned about the drug test. The patient reported she spent  quality family time with her children Saturday and then went to a meeting Saturday night at [facility]. The patient also met another group member at a NA meeting Friday night. The patient is moving back to her house Wednesday. She has temporarily been staying with her mother. The patient reported she talked with her sponsor Sunday. The patient completed her genogram and had some mental illness in her family. She also reported she did have some family history of alcoholism, especially from her paternal grandmother. The patient participated well in group even though it was obvious she was in extreme pain. The patient maintains her sobriety date is 2/25.   Family Program: Family present? No   Name of family member(s):   UDS collected: Yes Results: Positive for alcohol  AA/NA attended?: YesFriday and Saturday  Sponsor?: Yes   Philip Kotlyar S, Licensed Cli

## 2014-07-03 ENCOUNTER — Encounter (HOSPITAL_COMMUNITY): Payer: Self-pay | Admitting: Licensed Clinical Social Worker

## 2014-07-04 ENCOUNTER — Other Ambulatory Visit (HOSPITAL_COMMUNITY): Payer: BLUE CROSS/BLUE SHIELD | Admitting: Psychology

## 2014-07-04 DIAGNOSIS — F319 Bipolar disorder, unspecified: Secondary | ICD-10-CM | POA: Diagnosis not present

## 2014-07-04 NOTE — Progress Notes (Signed)
WILFRED SIVERSON is a 44 y.o. female patient. CD-IOP TX Planning Session: I met with patient for first individual session to determine the goals she would like to work on in CD-IOP. I also referred her to Dr. Lovena Le in the Psych [place] program her at [facility] for a consult. CD-IOP [place] team asked for consult to determine if patient would be better served in the Psych IOP. Dr. Lovena Le determined her medications were managing her bi-polar symptoms and the concern now is her alcoholism. The patient has been going to meeting regularly and has a sponsor. Last night she moved into her apt again. She was nervous about moving in but reported it went well. She did take Trazadone to sleep but it made her feel groggy this morning. The patient will have her 3 children this weekend. The pt. Reports this is overwhelming for her but they want to see her. Discussed with the pt alternatives to having the children all weekend. She has spoken with her mother to ask for assistance this weekend if she needs it. The patient identified 4 goals for CD-IOP. Her primary goals are to remain sober and and develop a 12-step support system. The pt would also like to learn coping skills for her anxiety. She would also identified another goal: Becoming more engaged with activities for her children, including a sober network for herself. The patient is very engaged in learning tools to remain sober. Pt signed her tx plan. Her sobriety date remains 2/25.        MACKENZIE,LISBETH S, Licensed Cli

## 2014-07-05 ENCOUNTER — Encounter (HOSPITAL_COMMUNITY): Payer: Self-pay | Admitting: Psychology

## 2014-07-05 NOTE — Progress Notes (Signed)
Daily Group Progress Note  Program: CD-IOP   Group Time: 1-2:30 pm  Participation Level: Active  Behavioral Response: Appropriate and Sharing  Type of Therapy: Psycho-education Group  Topic: To begin, group members took turns sharing about any recovery related activities they had engaged in since the last group meeting, as well as any challenges that they faced.  The chaplain visited group today, and focused on experiencing emotions in the present moment.  Group members took turns discussing what they were feeling and experiencing, as well as providing feedback and advice to each other.  Group Time: 2:45- 4pm  Participation Level: Active  Behavioral Response: Sharing  Type of Therapy: Process Group  Topic: The second portion of group was focused on allowing remaining group members who had not yet had the chance to check in to do so.  There was one new group member today and she took the time to introduce herself and share about what has led her to treatment at this time.  Drug tests were collected today as well.  Summary: The patient stated that she left group on Monday and did not attend a meeting due to the adverse medication reaction she was experiencing.  On Tuesday she attended some doctor's appointments, cleaned, and bought some new bedroom furniture for her house.  She also met with her sponsor and attended a meeting.  The patient stated that she will be moving back into her house tonight and is feeling anxiety about being back there.  She told the group about her plans for the evening and the group recommended that she reach out for support if needed.  The patient is doing well and her sobriety date is 2/25.   Family Program: Family present? No   Name of family member(s):   UDS collected: Yes Results: pending  AA/NA attended?: YesTuesday  Sponsor?: Yes   Elizabeth Haff, LCAS

## 2014-07-07 ENCOUNTER — Other Ambulatory Visit (HOSPITAL_COMMUNITY): Payer: BLUE CROSS/BLUE SHIELD | Admitting: Licensed Clinical Social Worker

## 2014-07-07 ENCOUNTER — Encounter (HOSPITAL_COMMUNITY): Payer: Self-pay | Admitting: Psychology

## 2014-07-07 DIAGNOSIS — F1020 Alcohol dependence, uncomplicated: Secondary | ICD-10-CM

## 2014-07-07 DIAGNOSIS — F319 Bipolar disorder, unspecified: Secondary | ICD-10-CM | POA: Diagnosis not present

## 2014-07-07 NOTE — Progress Notes (Signed)
    Daily Group Progress Note  Program: CD-IOP   Group Time: 1-2:30 pm  Participation Level: Active  Behavioral Response: Appropriate and Sharing  Type of Therapy: Process Group  Topic: Process: the first part of group was spent in process. Members shared about what they have done since the last session to strengthen their recovery. Any challenges or struggles they have experienced are also identified. A new member was present in group today and she introduced herself to her new group members. During group today, the program director, CK, met with 2 new group members.  Group Time: 2:45- 4pm  Participation Level: Active  Behavioral Response: Appropriate  Type of Therapy: Psycho-education Group  Topic: Psycho-Ed: The Phases of Recovery. The second half of group was spent in a psycho-ed. Members were provided with a handout on the phases of recovery. Included was "A Recovery Checklist" and members identified what they had completed in the past or what they were now dealing with in each phase of recovery. There was a good discussion with members taking turns reading the check-list and sharing their answers from the checklist. The session ended with members disclosing their weekend plans.   Summary: The patient reported that, after leaving group, she had attended 2 AA meetings. The patient explained that she was worried because she was moving back into her home after having been gone for almost 10 weeks. Later in the evening, she began unpacking and putting things back in their place. The patient reported she handled things pretty well and is feeling pretty good about being back at home. The patient admitted that she was a little nervous because her 3 children are coming to spend the entire weekend with her. The patient's fellow group members provided good feedback and suggestions to help her reduce her stress and enjoy their visit. She had obviously not ever considered some of the very obvious  suggestions provided, but was very appreciative towards the suggestions and planned on following through on many of them. In the second half of group, the patient reported she had passed through the first category, which was bottoming out, but admitted she is working on the ambivalence part. The patient provided good feedback and was very receptive to her fellow group members. As the session came to an end, the patient was able to identify what meetings she intends to attend over the weekend and will be occupied most of the remainder of the weekend. The patient responded well to this intervention and she is making good progress in her recovery. She is much changed since the first session in this program and today she is much more relaxed and at peace than she first presented. The patient attended a total of 3 12-step meetings since the last group session and her sobriety date remains 2/25.   Family Program: Family present? No   Name of family member(s):   UDS collected: No Results:  AA/NA attended?: YesWednesday and Thursday  Sponsor?: Yes   Tina [name], LCAS

## 2014-07-08 ENCOUNTER — Encounter (HOSPITAL_COMMUNITY): Payer: Self-pay | Admitting: Licensed Clinical Social Worker

## 2014-07-08 NOTE — Progress Notes (Signed)
    Daily Group Progress Note  Program: CD-IOP   Group Time: 1-2:30  Participation Level: Active  Behavioral Response: Appropriate and Sharing  Type of Therapy: Process Group  Topic: After checking in, group members took turns sharing about recovery-related activities and challenges they faced since the last group on Friday.  Group members were active during the discussion and many group members engaged in giving feedback to other group members. Two new group members joined the group today and introduced themselves to the group. Drug tests were taken today.     Group Time: 2:45-4  Participation Level: Active  Behavioral Response: Appropriate and Sharing  Type of Therapy: Psycho-education Group  Topic: The second part of group focused on Part 2 Phases of Recovery. A continuation of Friday's group entailed each group member identifying issues in recovery they currently deal with or have in the past. Group members were engaged in discussion of the Ambivalence Phase of Recovery.     Summary: The patient reported she had an overall good weekend with her children. She did experience some stress after dinner on Saturday night. This was a time when she would normally drink. The patient was able to call her sponsor to help her deal with the anxiety and urge to drink. The patient reported she tried to set boundaries with her children over the weekend. This was different for her as she was usually drunk when she was with her children. She did not go to meeting on Friday or Saturday but did go to 2 meetings Sunday. The patient has 60 days clean today and will pick up her chip on Thursday night at her home group. The group applauded her on 60 days clean. When her children left Sunday evening she became depressed. This was also a time she would normally drink so she left her house and went to a meeting. The patient was able to identify her ambivalence about her recovery. She now focuses on "just for  today" but sometimes imagines being able to drink socially. The patient was engaged in the discussion of the phases of recovery. She reported she had an understanding of the ambivalence phase. Her sobriety date remains 2/25.   Family Program: Family present? No   Name of family member(s):   UDS collected: Yes Results:   AA/NA attended?: [place]  Sponsor?: Yes   Fumi Guadron S, Licensed Cli

## 2014-07-09 ENCOUNTER — Other Ambulatory Visit (HOSPITAL_COMMUNITY): Payer: BLUE CROSS/BLUE SHIELD | Admitting: Psychology

## 2014-07-09 DIAGNOSIS — F319 Bipolar disorder, unspecified: Secondary | ICD-10-CM | POA: Diagnosis not present

## 2014-07-09 DIAGNOSIS — F1020 Alcohol dependence, uncomplicated: Secondary | ICD-10-CM

## 2014-07-09 DIAGNOSIS — Z8619 Personal history of other infectious and parasitic diseases: Secondary | ICD-10-CM

## 2014-07-09 DIAGNOSIS — F3110 Bipolar disorder, current episode manic without psychotic features, unspecified: Secondary | ICD-10-CM

## 2014-07-10 ENCOUNTER — Encounter (HOSPITAL_COMMUNITY): Payer: Self-pay | Admitting: Psychology

## 2014-07-10 ENCOUNTER — Encounter (HOSPITAL_COMMUNITY): Payer: Self-pay | Admitting: Licensed Clinical Social Worker

## 2014-07-10 NOTE — Progress Notes (Unsigned)
    Daily Group Progress Note  Program: {CHL AMB BH IOP/CDIOP Program Type:21022744}   Group Time: ***  Participation Level: {CHL AMB BH Group Participation:21022742}  Behavioral Response: {CHL AMB BH Group Behavior:21022743}  Type of Therapy: {CHL AMB BH Type of Therapy:21022741}  Topic: ***     Group Time: ***  Participation Level: {CHL AMB BH Group Participation:21022742}  Behavioral Response: {CHL AMB BH Group Behavior:21022743}  Type of Therapy: {CHL AMB BH Type of Therapy:21022741}  Topic: ***   Summary: ***   Family Program: Family present? {BHH YES OR NO:22294}   Name of family member(s): ***  UDS collected: {BHH YES OR NO:22294} Results: {Findings; urine drug screen:60936}  AA/NA attended?: {BHH YES OR NO:22294}{DAYS OF ZOXW:96045}WEEK:22385}  Sponsor?: {BHH YES OR NO:22294}   MACKENZIE,LISBETH S, Licensed Cli

## 2014-07-10 NOTE — Progress Notes (Unsigned)
Tina [name] is a 44 y.o. female patient CD-IOP Individual Counseling Session. Met with patient today to discuss progress on treatment goals. The patient is going to 5+ meetings per week. She has a sponsor and has a home group. She picked up her 60 day chip today. She is also going to go to her home group tonight. She is still having problems with her anxiety and will meet with the PA Harold Hedge tomorrow to discuss her medications. Pt. Reports she feels no connection to anyone. She feels her mood is flat, but is not depressed. She has been depressed many times before and knows depression and this is not it. The patient reports she is still grieving her marriage. I referred her to [facility] to work on that issue. She is meeting people in [place] and seeing old [place] members she used to know before when she was in [place]. She does not have a good self-image and doesn't feel she is worthy as a friend, daughter, sister, mother, sponsee. I gave her homework to write 25 things she likes about herself. She did not like the assignment and said it was too hard. I gave her 4 days to complete it so she can take her time and really look at herself. The patient is able to identify her mood and feelings readily. She is progressing well in the program and appears honest about her recovery. She will continue to be seen weekly individually. Her sobriety date remains 2/25.        Nyheem Binette S, Licensed Cli

## 2014-07-10 NOTE — Progress Notes (Signed)
    Daily Group Progress Note  Program: CD-IOP   Group Time: 1-2:30 pm  Participation Level: Active  Behavioral Response: Appropriate and Sharing  Type of Therapy: Process Group  Topic: After checking in, group members took turns sharing about any recovery related activities they had engaged in since the last group meeting, as well as any challenges they faced.  Anxiety reduction strategies were also discussed and group members provided each other with feedback throughout the session. Drug tests results were returned today and 2 were collected from the newest members.  Group Time: 2:45- 4pm  Participation Level: Active  Behavioral Response: Appropriate  Type of Therapy: Psycho-education Group  Topic: "Self-Care"; the second part of group was spent in a psycho-ed on Self Care.  A PowerPoint presentation was provided followed up by handouts. The topics included diet and sleep. Group members learned about the importance of good nutrition and shared about some of their own habits and patterns, many of which were unhealthy and discouraged. They also discussed strategies for eating in more healthful and consistent ways. Exercise and sleep hygiene strategies were also discussed.  Summary: The patient stated that she walked her dog on Monday afternoon, before going to dinner at her mother's house. After dinner she attended an AA meeting.  She did housework on Tuesday and stated that she had a lot of cravings throughout the day.  The patient expressed concern that her anxiety medication is not working and stated that she feels as if it wears off in the afternoons.  Anxiety reduction strategies were discussed and group members offered feedback to her regarding these concerns.  The patient talked with her sponsor and has attended three meetings since Monday.  She is doing well and buying into the importance of structure and routine. She responded well to this intervention and her sobriety date  remains 3/25.   Family Program: Family present? No   Name of family member(s):   UDS collected: No Results:   AA/NA attended?: YesMonday and Tuesday  Sponsor?: Yes   Kourtney Montesinos, LCAS

## 2014-07-11 ENCOUNTER — Other Ambulatory Visit (HOSPITAL_COMMUNITY): Payer: BLUE CROSS/BLUE SHIELD | Admitting: Psychology

## 2014-07-11 ENCOUNTER — Encounter (HOSPITAL_COMMUNITY): Payer: Self-pay | Admitting: Medical

## 2014-07-11 DIAGNOSIS — F3175 Bipolar disorder, in partial remission, most recent episode depressed: Secondary | ICD-10-CM | POA: Insufficient documentation

## 2014-07-11 DIAGNOSIS — F319 Bipolar disorder, unspecified: Secondary | ICD-10-CM | POA: Diagnosis not present

## 2014-07-11 MED ORDER — GABAPENTIN 400 MG PO CAPS: 400.0000 mg | ORAL_CAPSULE | Freq: Four times a day (QID) | ORAL | Status: DC

## 2014-07-11 MED ORDER — ESCITALOPRAM OXALATE 20 MG PO TABS: ORAL_TABLET | ORAL | Status: DC

## 2014-07-11 MED ORDER — BACLOFEN 20 MG PO TABS: ORAL_TABLET | ORAL | Status: DC

## 2014-07-11 MED ORDER — BACLOFEN 20 MG PO TABS: 20.0000 mg | ORAL_TABLET | Freq: Three times a day (TID) | ORAL | Status: DC

## 2014-07-11 NOTE — Progress Notes (Signed)
Patient ID: Tina [name], female   DOB: 1970-06-25, 44 y.o.   MRN: [id number]  S-Pt seen for medication management related to blunted affect and anxiety.She has been on Lexapro 20 mg for 4 weeks and WELLBUTRIN XL 300 mg for 1 1/2 weeks.Her anxiety was not helped by Inderal.She tapered her Neurontin and Baclofen after c/o "feeling like a zombie" from that regimine-Now she reports she has resumed the Neurontin /Baclofen rx and her anxiety is managed !? She reports her Depression is lifted but her emotions remain blunted.She wants "to be able to smile at things".She continues to be abstinent.  O-She is alert and oriented Her affect is blunted but she smiles occasionally.Perception limited.Thought-comprehensible-free of hallucinosis;suicidality.  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

## 2014-07-14 ENCOUNTER — Encounter (HOSPITAL_COMMUNITY): Payer: Self-pay | Admitting: Psychology

## 2014-07-14 ENCOUNTER — Other Ambulatory Visit (HOSPITAL_COMMUNITY): Payer: BLUE CROSS/BLUE SHIELD | Attending: Psychiatry | Admitting: Licensed Clinical Social Worker

## 2014-07-14 DIAGNOSIS — F1121 Opioid dependence, in remission: Secondary | ICD-10-CM | POA: Insufficient documentation

## 2014-07-14 DIAGNOSIS — F1021 Alcohol dependence, in remission: Secondary | ICD-10-CM | POA: Diagnosis not present

## 2014-07-14 DIAGNOSIS — F319 Bipolar disorder, unspecified: Secondary | ICD-10-CM | POA: Diagnosis not present

## 2014-07-14 DIAGNOSIS — F1020 Alcohol dependence, uncomplicated: Secondary | ICD-10-CM

## 2014-07-14 NOTE — Progress Notes (Signed)
    Daily Group Progress Note  Program: CD-IOP   Group Time: 1-2:30 pm  Participation Level: Active  Behavioral Response: Appropriate and Sharing  Type of Therapy: Process Group  Topic: Process; the first half of group was spent in process. Members shared about the recovery-related activities they had engaged in since the last group session. Also discussed where any challenges or issues that may have threatened their recovery. During group today, the Market researcher met with group members, including 3 for their initial assessment, and a member who was requesting a med check. Two drug tests, both negative, were returned to 2 new group members.   Group Time: 2:45- 4pm  Participation Level: Active  Behavioral Response: Appropriate  Type of Therapy: Psycho-education Group  Topic: "Families: Nurturing vs Dysfunctional". The second half of group was spent in a psycho-ed. A PowerPoint slide show was shown and members were provided with a handout listing the characteristics of a nurturing family as compared to a dysfunctional one. As the group read over the descriptions, members shared about their own families and any characteristics they could identify. The discussion was emotional and painful for some members. Most of those present today grew up in homes with addiction and/or dysfunctional family members. As requested, members had brought in pictures of themselves when they were children and everyone shared in viewing them. Near the conclusion of the session, a graduation ceremony was held for the Intern who had recently completed her studies and would be leaving the program after working with the program for the past 9 months.   Summary: The patient reported she had attended 3 AA meetings since Wednesday and picked up her 60 day chip. The group applauded this news. She reported that she had met on old friend in one meeting and they reconnected. She was pleased to have added another member to  her support network. The patient reported that she and her sponsor were reading in the [place] and were currently on the chapter entitled, "The Doctor's Opinion". She agreed with another member that it was a really good chapter. In the psycho-ed, the patient identified many of those characteristics listed under dysfunctional in her childhood. She explained that her step-father was an alcoholic and when he drank, one never knew what would happen or how he would act. I pointed out that it is the uncertainty that contributes to stress and negates any hopes for consistency or a sense of security for the other family members, especially the children. The patient also reported that she was the oldest and frequently got the brunt of the criticism from her step-father as opposed to her 2 younger siblings. The patient met with the medical director briefly to discuss her medications. Upon her return, she provided words of hope and encouragement to the departing intern. The patient is making good progress and doing everything we have asked of her. She remains drug-free with a sobriety date of 2/25.   Family Program: Family present? No   Name of family member(s):   UDS collected: No Results:   AA/NA attended?: YesWednesday and Thursday  Sponsor?: Yes   Maryanna Stuber, LCAS

## 2014-07-16 ENCOUNTER — Other Ambulatory Visit (HOSPITAL_COMMUNITY): Payer: BLUE CROSS/BLUE SHIELD

## 2014-07-16 ENCOUNTER — Encounter (HOSPITAL_COMMUNITY): Payer: Self-pay | Admitting: Licensed Clinical Social Worker

## 2014-07-16 ENCOUNTER — Telehealth (HOSPITAL_COMMUNITY): Payer: Self-pay | Admitting: Licensed Clinical Social Worker

## 2014-07-16 NOTE — Progress Notes (Signed)
    Daily Group Progress Note  Program: CD-IOP   Group Time: 1-2:30  Participation Level: Active  Behavioral Response: Appropriate and Sharing  Type of Therapy: Process Group  Topic: After checking in, group members took turns sharing about recovery-related activities and challenges they faced since the last group on Friday.  Group members were active during the discussion and many group members engaged in giving feedback to other group members. Drug tests were taken today. T   Group Time: 2:45-4  Participation Level: Active  Behavioral Response: Appropriate and Sharing  Type of Therapy: Psycho-education Group  Topic: The second part of group focused on Family Dynamics of Addiction Part 1. Each group member was given a worksheet on the roles family members assume when there is addiction or dysfunction in the family. There was good discussion surrounding each of the family roles. Group members were able to identify what roles they assumed in their family of origin.     Summary: The patient reported she had her children all weekend. They ended up staying with her mother Friday night so she could go to a meeting. This was a hard decision for her and the children were confused as to why they weren't staying with her. She reports she has lots of "mommy guilt" and wants to be with her children as much as she can even if it stressful and overwhelming for her. She received feedback from other mothers in the group who suggested she still needs to put herself first. The patient listened to the suggestions but is still hard on herself. The patient went to a meeting Sunday. The patient was an active participant in the family roles discussion. She was able to identify her role as a "scapegoat" in her family of origin. The patient received feedback from other group members who had identified themselves as a "scapegoat" as well. Her sobriety date remains 2/25.   Family Program: Family present? No    Name of family member(s):   UDS collected: Yes Results: Pending  AA/NA attended?: YesMonday and Saturday  Sponsor?: Yes   MACKENZIE,LISBETH S, Licensed Cli

## 2014-07-17 ENCOUNTER — Encounter (HOSPITAL_COMMUNITY): Payer: Self-pay | Admitting: Licensed Clinical Social Worker

## 2014-07-18 ENCOUNTER — Other Ambulatory Visit (HOSPITAL_COMMUNITY): Payer: BLUE CROSS/BLUE SHIELD | Admitting: Licensed Clinical Social Worker

## 2014-07-18 DIAGNOSIS — F1020 Alcohol dependence, uncomplicated: Secondary | ICD-10-CM

## 2014-07-18 DIAGNOSIS — F319 Bipolar disorder, unspecified: Secondary | ICD-10-CM | POA: Diagnosis not present

## 2014-07-18 NOTE — Progress Notes (Signed)
Tina [name] is a 44 y.o. female patient. CD-IOP Individual Counseling Session. Met with pt for weekly individual session. Pt was absent from group yesterday. She reported she was sick. Pt actually drank yesterday. Processed the relapse with pt. Tuesday pt reports she stayed home alone all day and thought about drinking. She did not go to a meeting Tuesday night. Wednesday she did some errands and went to the [facility] store. She sat in the parking lot and a friend in recovery called her. She did not answer the phone. While in the [facility] store she was crying. The staff asked her if she was ok. Pt reported she had a lot of interferences that should have stopped her from drinking but she ended up buying the fifth of rum. Pt reports she drank half of the bottle of rum and threw out the other half. Pt identified things she could have done instead of drinking but chose to drink instead. Pt reports she is sad about her divorce and feels bad about herself. I referred the pt to [facility] for counseling to deal with the feelings of the loss of her marriage. Pt has also made an appt to see Granville Lewis NP who manages her meds in the past. Pt also shared during her last drinking episodes she was engaged in a lot of risky situations: unprotected sex, inviting men she had met on the internet to her house, sexting... Pt reports she had a STD/HIV testing while at the [place], but encouraged her to have another HIV test.  She has a lot of shame and guilt due to her choices while drinking and in her mania. Pt reports she drinks to escape from herself and the poor choices she has made. Pt had homework to name 25 things she likes about herself. She has only identified 12. She does not life who she is and is sad. Pt is going to her home group tonight and will pick up a starter chip. Pt acknowledges she will have to tell the group tomorrow she relapsed. Her sobriety date is now 5/5.        Haydyn Liddell S, Licensed Cli

## 2014-07-21 ENCOUNTER — Encounter (HOSPITAL_COMMUNITY): Payer: Self-pay | Admitting: Licensed Clinical Social Worker

## 2014-07-21 ENCOUNTER — Other Ambulatory Visit (HOSPITAL_COMMUNITY): Payer: BLUE CROSS/BLUE SHIELD | Admitting: Licensed Clinical Social Worker

## 2014-07-21 NOTE — Progress Notes (Signed)
    Daily Group Progress Note  Program: CD-IOP   Group Time: 1-2:30  Participation Level: Active  Behavioral Response: Appropriate and Sharing  Type of Therapy: Process Group  Topic: After checking in, group members took turns sharing about recovery-related activities and challenges they faced since the last group on Wednesday. One group member reported she relapsed. The relapse process was mapped out on the whiteboard. Group members were openly engaged in suggestions and feedback to the group member. Three group members saw PA Eloisa NorthernKober today. One group member was absent today.    Group Time: 2:45-4  Participation Level: Active  Behavioral Response: Appropriate and Sharing  Type of Therapy: Process Group  Topic: he second part of group was a continuation of challenges they faced since the last group. Two group members also had "a burning desire" that they wanted to discuss. One group member reported she is sad, depressed, and angry. Another group member reported she is agitated, tired and in physical pain. She reported she wants to drink so she can sleep. Several group members gave suggestions and strategies for relapse and a discussion ensued on the effects of PAWS.  Group members showed support to both group members. All group members appeared open and honest in sharing feelings during the process.      Summary: The patient apologized to the group for missing Wednesday's group. She reported she was not sick; she was drinking. The relapse was mapped out on the white board. She reported several interruptions that happened that should have kept her from drinking: phone calls from recovery people, crying in the [facility] store, sitting in her car for an extended period of time. The patient reported her feelings of embarrassment and guilt. She did not go to a meeting until this morning and picked up a starter chip. The patient received positive feedback and suggestions from many group members.  She was receptive to the discussion on relapse, as well as suggestions and feedback from other group members. Her sobriety date is now 5/5.   Family Program: Family present? No   Name of family member(s):   UDS collected: Yes Results:   AA/NA attended?: YesFriday  Sponsor?: Yes   Olie Scaffidi S, Licensed Cli

## 2014-07-22 ENCOUNTER — Encounter (HOSPITAL_COMMUNITY): Payer: Self-pay | Admitting: Licensed Clinical Social Worker

## 2014-07-22 NOTE — Progress Notes (Signed)
    Daily Group Progress Note  Program: CD-IOP   Group Time: 1-2:30  Participation Level: Active  Behavioral Response: Appropriate and Sharing  Type of Therapy: Process Group  Topic: After checking in, group members took turns sharing about recovery-related activities and challenges they faced since the last group on Friday. They also shared any urges or cravings they had over the weekend.  Group members were openly engaged in suggestions and feedback to the group member. One group member was absent. Drug tests were given out to each group member. One group member graduated today from the program. The patient graduating received accolades from her fellow group members. Her husband came for the graduation. The second part of group focused on cognitive distortions. A staff member from the [employer] began an 8 week program titled "Down the Rabbit Hole," focusing on cognitive distortions. Each week 2 cognitive distortions will be discussed along with coping tools to use to change thinking patterns. Group members were appropriate, attentive and appeared interested in the topic. Many group members were openly engaged in identifying rational thoughts that can be used to combat the distorted thought     Group Time: 2:45-4  Participation Level: Active  Behavioral Response: Appropriate and Sharing  Type of Therapy: Psycho-education Group  Topic: The second part of group focused on cognitive distortions. A staff member from the [employer] began an 8 week program titled "Down the Rabbit Hole," focusing on cognitive distortions. Each week 2 cognitive distortions will be discussed along with coping tools to use to change thinking patterns. Group members were appropriate, attentive and appeared interested in the topic. Many group members were openly engaged in identifying rational thoughts that can be used to combat the distorted thought     Summary: :  Patient  reported she had a really good weekend with her 3 children. They spent the whole weekend doing sports and being together around the house/yard. On Saturday her son ([id number]) babysat the 2 younger children while she went to a meeting. It worked out fine and she brought him home a treat to thank him.  Sunday night after her children left she went to a meeting. This morning she went to a meeting and had lunch with some fellow group members. She was in good spirits today and is moving past her relapse. She was actively engaged in the discussion on cognitive distortions. She reported her cognitive distortion was "she was no longer married." But.she is a good mother, a good friend, a good daughter, good sister, loving children, good life. That is her cognitive distortion. She reports she has a good life now and is working on her mental health and learning to stay sober. Her sobriety date remains 5/5.   Family Program: Family present? No   Name of family member(s):   UDS collected: Yes Results: Pending  AA/NA attended?: YesMonday, Saturday and Sunday  Sponsor?: Yes   MACKENZIE,LISBETH S, Licensed Cli

## 2014-07-23 ENCOUNTER — Other Ambulatory Visit (HOSPITAL_COMMUNITY): Payer: BLUE CROSS/BLUE SHIELD | Admitting: Psychology

## 2014-07-23 DIAGNOSIS — F319 Bipolar disorder, unspecified: Secondary | ICD-10-CM | POA: Diagnosis not present

## 2014-07-24 ENCOUNTER — Encounter (HOSPITAL_COMMUNITY): Payer: Self-pay | Admitting: Psychology

## 2014-07-24 ENCOUNTER — Encounter (HOSPITAL_COMMUNITY): Payer: Self-pay | Admitting: Licensed Clinical Social Worker

## 2014-07-24 NOTE — Progress Notes (Signed)
    Daily Group Progress Note  Program: CD-IOP   Group Time: 1-2:30 pm  Participation Level: Active  Behavioral Response: Appropriate  Type of Therapy: Psycho-education Group  Topic: Psycho-Ed: the first part of group was spent in a psycho-ed with a visiting chaplain. He invited group members to share their own definitions of Spirituality. While some members easily identified their description, others struggled and were confused about spirituality versus religion. The session ended with members able to differentiate the meaning of spirituality and religion. Group members reported they had enjoyed the session and the manner of the guest. One drug test was collected during group today while the results [facility] were returned to others.   Group Time: 2:45- 4pm  Participation Level: Active  Behavioral Response: Appropriate and Sharing  Type of Therapy: Process Group  Topic: Process/Graduation: the second half of group was spent in process. Members shared about the recovery-related things they had done since the last group session. While some reported attending a number of 12-step meetings, others admitted that they had not been to any. The rule of the program, including attending at least 4 12-step meetings per week was emphasized. The session ended with a graduation ceremony. The member graduating had invited his parents and girlfriend. Also invited were 2 former group members who had previously graduated. Kind words were shared and the session proved very touching to all present.    Summary: The patient engaged in the discussion on spirituality. She agreed with another member that it was very confusing trying to understand spirituality versus religion. When asked, she reported that she was a spiritual person and not a religious person. The patient talked about how important nature is to her. She had called out to her 42 yo son when she and a younger son were watching two birds  fight it out for nesting space on a wreath she had on the front door. They had watched this battle for 20 minutes and all of them were fascinated. The patient agreed that the natural world is very important to her, but she also acknowledged that she hadn't been making the time for it. It was pointed out that she finds her bliss in nature and it is essential she make a little time each day to re-charge her batteries and nurture her soul immersed in nature. The patient agreed with this and stated her intention to make the time. In process, the patient reported she had attended 2 AA meetings. She was encouraging to a new member and suggested some meetings that had younger people. She had kind words of hope and encouragement for the graduating member. The patient responded well to this intervention and her sobriety date remains 5/1.   Family Program: Family present? No   Name of family member(s):   UDS collected: No Results:   AA/NA attended?: YesMonday and Tuesday  Sponsor?: Yes   Oris Calmes, LCAS

## 2014-07-24 NOTE — Progress Notes (Signed)
Tina [name] is a 44 y.o. female patient. CD-IOP Individual Counseling Session:  Met with pt. For weekly individual counseling session. Saw Nurse Practitioner today for a medication check. She did not change any of her medications for now. NP asked pt to monitor her symptoms and to watch for depression and mania. Pt reported she knows the symptoms of depression and mania and knows what to look for.  Pt. Has appointment tomorrow at [facility] to begin to deal with the loss of her marriage. Her NP also thought it was a good idea for her to go to [facility]. Discussed her cognitive distortion of the loss of her marriage. She did point out that however she is a good mother, good friend, good sister, good daughter, has good loving children, good life now... working on her mental health issues and staying sober. The pt went back to her marriage home yesterday and it was tearful and sad for her. She went to her mother's house afterwards and her mother was not as supportive as she wanted her to be. Saturday is her birthday and she has plans to go out with her mother and her sister for lunch. The pt has gone to 4 meetings this week, talks to her sponsor daily and has a home group. The pt is working on trying to learn to have fun in recovery. I challenged the pt to do 1 fun thing weekly. Pt completed PHQ-9. Her outlook has improved and she reports she is moving forward after her lapse last week. Her progress will continue to be monitored. Her sobriety date is 5/5.        Mirissa Lopresti S, Licensed Cli

## 2014-07-25 ENCOUNTER — Other Ambulatory Visit (HOSPITAL_COMMUNITY): Payer: BLUE CROSS/BLUE SHIELD | Admitting: Psychology

## 2014-07-25 DIAGNOSIS — F319 Bipolar disorder, unspecified: Secondary | ICD-10-CM | POA: Diagnosis not present

## 2014-07-28 ENCOUNTER — Other Ambulatory Visit (HOSPITAL_COMMUNITY): Payer: BLUE CROSS/BLUE SHIELD | Admitting: Licensed Clinical Social Worker

## 2014-07-28 ENCOUNTER — Encounter (HOSPITAL_COMMUNITY): Payer: Self-pay | Admitting: Psychology

## 2014-07-28 DIAGNOSIS — F1020 Alcohol dependence, uncomplicated: Secondary | ICD-10-CM

## 2014-07-28 DIAGNOSIS — F319 Bipolar disorder, unspecified: Secondary | ICD-10-CM | POA: Diagnosis not present

## 2014-07-28 NOTE — Progress Notes (Signed)
    Daily Group Progress Note  Program: CD-IOP   Group Time: 1-2:30 pm  Participation Level: Active  Behavioral Response: Appropriate and Sharing  Type of Therapy: Process  Topic: Process: the first part of group was spent in process. Members shared about things they had done since the last session that supported their recovery. Any obstacles, temptations or thoughts about using were also disclosed. During group today, the Investment banker, operational met with the new member for a psychiatric assessment.   Group Time: 2:45-4pm  Participation Level: Active  Behavioral Response: Appropriate  Type of Therapy: Psycho-education Group  Topic: Family Sculpture/Pictures: the second half of group was spent in a psycho-ed. Those members who had not done so previously, 'Sculpted" their families. They explained the different roles and atmosphere during the sculptures and received good feedback and comments by their fellow group members. Members had brought pictures from their childhoods and group members walked around the room looking at the pictures. Members shared their memories around these pictures and the events that were occurring around them. This represented another opportunity to understand and get to know one another even better.   Summary: The patient reported she had attended 2 AA meetings since the last group session. She had met with her NP, who is managing and monitoring her medications. The patient noted that tomorrow was her birthday and group members wondered what she would do on this special day. She had a few plans, including a meal with her sister and mother, but admitted she wasn't sure what to do, especially something that might be special or make her feel good. The patient made some recommendations about young people meetings to the newest and youngest member of the group. She has been to many different meetings and recounted some that had more young people and that he might find more  interesting. The patient served as a family member in the sculptures and provided good feedback about the role she was playing She had brought a number of pictures and described what was going on in the pictures. The patient made some excellent comments and responded well to this intervention. Her sobriety date is 5/5.   Family Program: Family present? No   Name of family member(s):   UDS collected: No Results:   AA/NA attended?: YesWednesday and Thursday  Sponsor?: Yes   Lakeesha Fontanilla, LCAS

## 2014-07-29 ENCOUNTER — Encounter (HOSPITAL_COMMUNITY): Payer: Self-pay | Admitting: Licensed Clinical Social Worker

## 2014-07-29 NOTE — Progress Notes (Signed)
Tina [name] is a 44 y.o. female patient. CD-IOP Individual Counseling Session. Met with pt today for her weekly individual counseling session. Went over her tx goals. Pt goes to 5-6 meetings per week, has a sponsor and a home group. She reads out of the "Big Book" and talks to and meets with her sponsor. Pt previously met with her NP who is monitoring her medications. She reports she knows the symptoms of Mania and depression and will contact her NP if she notices a change in behavior. Pt has been working towards finding recreation for her and her children to participate in. She is slowly becoming more of a part of their lives. They celebrated her birthday Saturday night and it was made very special by her x husband. However she cried on her way home because she misses her family. Pt made an appt with a new therapist [facility] for individual therapy to deal with the loss of her marriage. She had previously been referred to [facility] but felt like it was too religious for her. She is good at asking for what she needs - good trait. Discussed her cognitive distortion - it's her fault she got a divorce. Processed this at length with her about the distortion. Her other distortion is she is not a good mother - processed this distortion with her. She was open to all suggestions and feedback about her distortions. Pt is progressing well in her recovery, staying sober and working a recovery program. Her sobriety date remains 5/5.        [name] S, Licensed Cli

## 2014-07-29 NOTE — Progress Notes (Signed)
    Daily Group Progress Note  Program: CD-IOP   Group Time: 1-2:30  Participation Level: Active  Behavioral Response: Appropriate and Sharing  Type of Therapy: Process Group  Topic: After checking in, group members took turns sharing about recovery-related activities and challenges they faced since the last group on Friday. They also shared any urges or cravings they may have experienced the weekend.  Group members were openly engaged in suggestions and feedback to a group member who was returning to the group after a relapse. Two group members were absent. Drug tests were given out and two negative drug tests were returned.     Group Time: 2:45-4  Participation Level: Active  Behavioral Response: Appropriate and Sharing  Type of Therapy: Psycho-education Group  Topic: The second part of group focused on cognitive distortions. A staff member from the [employer] facilitated sessions 2 of the 8 week program titled "Down the Rabbit Hole," focusing on cognitive distortions. The facilitator uses multi-media: comic strip and lyrics from a song to show examples of distortions.  Each week 2 cognitive distortions will be discussed along with coping tools to use to change thinking patterns. This week the 2 cognitive distortions discussed were: personalization and blaming. Group members were actively engaged in identifying rational thoughts that can be used to combat the distorted thought and determined how the distortion may have manifested in their own lives.     Summary: Patient reported she had a happy birthday Saturday. She spent the day with her sister and mother. Saturday night she went to her x-husband'[name] house and had birthday cake, cards and flowers. They all played games together and the night went well. The patient reported she cried all the way home because she missed her family. Patient gave feedback to returning patient on relapse, as she has relapsed while in the  program. The feedback was well received. The patient went to a meeting Saturday and 2 meetings Sunday. The patient reported on her personal cognitive distortion of blaming. She blamed herself for the loss of her marriage and it was suggested to her that it takes 2 people for a marriage not to be successful. She accepted the feedback from her fellow group member. She was active in the discussion on cognitive distortion.  Her sobriety date remains 5/5.   Family Program: Family present? No   Name of family member([name]):   UDS collected: Yes Results: Pending  AA/NA attended?: YesMonday, Saturday and Sunday  Sponsor?: Yes   Tina [name], Licensed Cli

## 2014-07-30 ENCOUNTER — Other Ambulatory Visit (HOSPITAL_COMMUNITY): Payer: BLUE CROSS/BLUE SHIELD | Admitting: Psychology

## 2014-07-30 ENCOUNTER — Encounter: Payer: Self-pay | Admitting: Psychiatry

## 2014-07-30 DIAGNOSIS — F1020 Alcohol dependence, uncomplicated: Secondary | ICD-10-CM

## 2014-07-30 DIAGNOSIS — F3175 Bipolar disorder, in partial remission, most recent episode depressed: Secondary | ICD-10-CM

## 2014-07-30 DIAGNOSIS — F319 Bipolar disorder, unspecified: Secondary | ICD-10-CM | POA: Diagnosis not present

## 2014-08-01 ENCOUNTER — Other Ambulatory Visit (HOSPITAL_COMMUNITY): Payer: BLUE CROSS/BLUE SHIELD | Admitting: Psychology

## 2014-08-01 DIAGNOSIS — F319 Bipolar disorder, unspecified: Secondary | ICD-10-CM | POA: Diagnosis not present

## 2014-08-04 ENCOUNTER — Encounter (HOSPITAL_COMMUNITY): Payer: Self-pay | Admitting: Psychology

## 2014-08-04 ENCOUNTER — Other Ambulatory Visit (HOSPITAL_COMMUNITY): Payer: BLUE CROSS/BLUE SHIELD | Admitting: Licensed Clinical Social Worker

## 2014-08-04 DIAGNOSIS — F1020 Alcohol dependence, uncomplicated: Secondary | ICD-10-CM

## 2014-08-04 DIAGNOSIS — F319 Bipolar disorder, unspecified: Secondary | ICD-10-CM | POA: Diagnosis not present

## 2014-08-04 NOTE — Psych (Signed)
Daily Group Progress Note  Program: CD-IOP   Group Time: 1-2:30 pm  Participation Level: Active  Behavioral Response: Appropriate and Sharing  Type of Therapy: Process Group  Topic: Group Process.  The first half of group was spent in process. Members shared about any problems or challenges to their sobriety. Members also identified what they had done since the last group to strengthen their recovery, including attending meetings, interacting with others in recovery, and self care, such as exercise, diet or self-reflection. During the session today, the program director met with a number of group members.  Group Time: 2:45-4pm  Participation Level: Active  Behavioral Response: Appropriate  Type of Therapy: Psycho-education Group  Topic: Forgiveness/Graduation Ceremony: the second half of group was spent in a psycho-ed on "Forgiveness". Members were provided with handouts and the session moved from resentments, discussed in the last session, to forgiveness. There were good questions about forgiveness, including the time frame and what happens if people aren't ready to forgive. A variety of ways to begin to forgive were offered and members were engaged and shared easily with each other about this struggle. As the session neared an end, a graduation ceremony was held to honor a successfully graduating member. There were kind words offered and the session and week ended with positive and validating session.   Summary: The patient reported she had attended 3 AA meetings since the last group session. She shared about how things change as one's life changes, especially, her responsibilities since her divorce. The patient directed her comments to a fellow members who is going through a divorce. We all agreed that he might be facing some tough times ahead and everyone agreed they would be here for him. The patient reported she liked some of the handouts on forgiveness, but noted that her  prayers are a little more simple than they ones documented in the handouts. But she agreed that writing letters were something she would do and thought would be helpful. The patient had kind words for the graduating member and wished her well. She pointed out how this woman has always been so welcoming to everyone and how much she respected her insight and words. The patient responded well to this intervention and her sobriety date remains 5/5.    Family Program: Family present? No   Name of family member(s):   UDS collected: No Results:  AA/NA attended?: YesWednesday and Thursday  Sponsor?: Yes   Weaver Tweed, LCAS

## 2014-08-06 ENCOUNTER — Other Ambulatory Visit (HOSPITAL_COMMUNITY): Payer: BLUE CROSS/BLUE SHIELD | Admitting: Psychology

## 2014-08-06 ENCOUNTER — Encounter (HOSPITAL_COMMUNITY): Payer: Self-pay | Admitting: Psychology

## 2014-08-06 ENCOUNTER — Encounter (HOSPITAL_COMMUNITY): Payer: Self-pay | Admitting: Licensed Clinical Social Worker

## 2014-08-06 DIAGNOSIS — F1020 Alcohol dependence, uncomplicated: Secondary | ICD-10-CM

## 2014-08-06 DIAGNOSIS — F319 Bipolar disorder, unspecified: Secondary | ICD-10-CM | POA: Diagnosis not present

## 2014-08-06 DIAGNOSIS — F3175 Bipolar disorder, in partial remission, most recent episode depressed: Secondary | ICD-10-CM

## 2014-08-06 NOTE — Progress Notes (Signed)
    Daily Group Progress Note  Program: CD-IOP   Group Time: 1-2:30  Participation Level: Active  Behavioral Response: Appropriate and Sharing  Type of Therapy: Process Group  Topic: After checking in, group members took turns sharing about recovery-related activities and challenges they faced since the last group on Friday. They also shared any urges or cravings they may have experienced over the weekend. There were 3 new members that joined the group today. Drug tests were collected from all present today.    Group Time: 2:45-4  Participation Level: Active  Behavioral Response: Appropriate and Sharing  Type of Therapy: Psycho-education Group  Topic: The second part of group focused on cognitive distortions. A staff member from the [employer] facilitated session # 3 of the 8 week program titled "Down the Rabbit Hole". The facilitator uses multi-media: podcast and segment from a sitcom to show examples of distortions.  Each week 2 cognitive distortions will be discussed along with coping tools to change thinking patterns. This week the 2 cognitive distortions discussed were: fixation on getting your way and being correct. Group members were actively engaged in identifying rational thoughts that can be used to combat the distorted thought and determined how the distortion may have manifested in their own lives. The coping tool discussed for these 2 cognitive distortions were a cost/benefit analysis. Each patient completed the worksheets on cognitive distortions.     Summary: :  The patient reported on her weekend with her children. She reported she had 10-15 cravings over the weekend. Her triggers were cooking dinner for her kids and stress of getting them to all their activities. She was able to work through the triggers by drinking water, hot tea and eating chocolate. She felt good about having teachable moments with her children on responsibility. She was open and  honest during the discussion on cognitive distortions. She completed the worksheets and reported on her always needing to be right about her children's health and never being open to other's suggestions. Her sobriety date remains 5/5.   Family Program: Family present? No   Name of family member(s):   UDS collected: Yes Results: Pending  AA/NA attended?: YesMonday, Saturday and Sunday  Sponsor?: Yes   MACKENZIE,LISBETH S, Licensed Cli

## 2014-08-06 NOTE — Progress Notes (Signed)
    Daily Group Progress Note  Program: CD-IOP   Group Time: 1-2:30 pm  Participation Level: Active  Behavioral Response: Appropriate and Sharing  Type of Therapy: Process Group  Topic: Process: the first part of group was spent in process. Members shared about current issues and challenges. They also identified what they had done to support their recovery since the last group session. The results of drug tests were handed out. One test was positive for alcohol and the member was asked about this. A discussion about 'total abstinence" ensued and with the exception of the member who tested positive, all other group members were adamant about the importance of total abstinence.  Group Time: 2:45- 4pm  Participation Level: Active  Behavioral Response: Appropriate  Type of Therapy: Psycho-education Group  Topic: Resentments: the second half of group was spent in a psycho-ed on Resentments. A handout was provided to group members and they took turns reading it. Included on the handout, was information about how resentments develop (unresolved anger/hurt) and the destructive nature of resentments for folks in early recovery. A good discussion followed with members sharing about their own resentments and how these 'grudges' manifested in their daily lives. The session included members sharing about what they get from holding onto resentments and why they don't more easily let them go. The disclosures were painful, but honest and the session focused on issues many members had not previously addressed or, in some instances, may not have even been aware of before.   Summary: The patient reported she had attended an [place] meeting yesterday. She had had her kids and had not had the freedom she has on some days. She had also met with a private counselor for her first session. It had been very good and they will be addressing her grief, especially around the loss of her marriage. The patient also expressed  her belief that she had had some "God Experiences". She described having avoided 2 potentially disastrous accidents and she attributed these near misses to God. The patient agreed that she had many resentments, including those towards herself. She made a good point about forgiveness and noted that if one can't forgive themselves, how could they possibly forgive another? The patient displayed good insight and provided good feedback to her fellow group members. She continues to make good progress in early recovery. Her sobriety date remains 5/5.    Family Program: Family present? No   Name of family member(s):   UDS collected: No Results:  AA/NA attended?: YesTuesday  Sponsor?: Yes   Kingsly Kloepfer, LCAS

## 2014-08-07 ENCOUNTER — Encounter (HOSPITAL_COMMUNITY): Payer: Self-pay | Admitting: Psychology

## 2014-08-07 ENCOUNTER — Encounter (HOSPITAL_COMMUNITY): Payer: Self-pay | Admitting: Licensed Clinical Social Worker

## 2014-08-07 NOTE — Progress Notes (Signed)
    Daily Group Progress Note  Program: CD-IOP   Group Time: 1-2:30 pm  Participation Level: Active  Behavioral Response: Appropriate and Sharing  Type of Therapy: Process Group  Topic: Process:  The first part of group was spent in process. Members shared about current issues and concerns regarding their recovery. Members included any meetings or sessions with sponsor. A new member was present and she introduced herself. Drug tests were collected from new members and the results of those tests collected on Monday were returned.  Group Time: 2:45- 4pm  Participation Level: Active  Behavioral Response: Sharing  Type of Therapy: Psycho-education Group  Topic: Psycho-Ed; The Clichs of AA: the second half of group was spent in a psycho-ed. A presentation and ensuing discussion on the meaning of the most well-known AA clichs was provided. Members were asked to identify the meaning of these 5 well-known sayings, including things like "Easy Does It" and "First Things First". While some members understood the meanings behind these sayings, others were not so clear. A lengthy discussion concerning the accuracy and importance of them occurred and there was excellent feedback among group members. The importance of taking things slowly, the priority on sobriety and letting go of control were some of the meanings behind these clichs.   Summary: The patient reported she had attended 3 AA meetings since the last group. She reported she had met with her private individual counselor yesterday and the session had proven very upsetting. She was instructed to write a good bye letter to her ex-husband. The patient admitted she was so upset she had cried in her car and finally called another group member because she couldn't seem to find her way out of the parking lot. The other member expressed her concerns for this patient and the patient stated she had really helped her. The group commented on how  fortunate it was that she had answered the call. The patient received good feedback from her fellow group members and was validated for having reached out to another recovering alcoholic instead of drinking. In the psycho-ed, the patient shared her comments on the meaning behind the clich "First Things First". She was very accurate and identified the meaning clearly. The patient is doing well in her early recovery and made some excellent comments. She responded well to this intervention and her sobriety date remains 5/5.    Family Program: Family present? No   Name of family member(s):   UDS collected: No Results: Monday's test was negative on all counts  AA/NA attended?: YesMonday, Tuesday and Wednesday  Sponsor?: Yes   Keidy Thurgood, LCAS

## 2014-08-08 ENCOUNTER — Other Ambulatory Visit (HOSPITAL_COMMUNITY): Payer: BLUE CROSS/BLUE SHIELD | Admitting: Psychology

## 2014-08-08 DIAGNOSIS — F1020 Alcohol dependence, uncomplicated: Secondary | ICD-10-CM

## 2014-08-08 DIAGNOSIS — F3175 Bipolar disorder, in partial remission, most recent episode depressed: Secondary | ICD-10-CM

## 2014-08-08 DIAGNOSIS — F319 Bipolar disorder, unspecified: Secondary | ICD-10-CM | POA: Diagnosis not present

## 2014-08-08 NOTE — Progress Notes (Signed)
Tina [name] is a 44 y.o. female patient. CD-IOP Individual Counseling session:  Met with patient for her weekly counseling session. Went over tx goals: Pt remains sober since her relapse and her sobriety date is 5/5. She will pick up her 30 day chip next week. Pt has begun seeing [facility] to help her with the grief and loss of her marriage. Pt met with her Tuesday and was unable to leave the parking lot due to sobbing. The therapist has asked her to write a goodbye letter to her x-husband so that she can begin to let go of her marriage. She reports she finds this hard to do. She called a group member from the parking lot and she helped her stay sober. She then went to a meeting. She goes to 6-8 meetings per week, has a sponsor and a home group. She takes her medications for bi-polar as prescribed and has identified the symptoms of mania and depression. She is still struggling with meeting sober women so she is working on meeting more women in the meetings. Her marriage was discussed and asked her to play the whole tape through if she would have stayed married to her husband. It would have been a loveless marriage but he is her best friend and she misses him. Asked pt what a perfect life would look like to her and she was able to name several things that her x-husband did not meet. She does have desires for her next partner. 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 Will continue to monitor her progress in the program. Her sobriety date is 5/5.        MACKENZIE,LISBETH S, Licensed Cli

## 2014-08-12 ENCOUNTER — Encounter (HOSPITAL_COMMUNITY): Payer: Self-pay | Admitting: Psychology

## 2014-08-12 NOTE — Progress Notes (Signed)
    Daily Group Progress Note  Program: CD-IOP   Group Time: 1-2:30 pm  Participation Level: Active  Behavioral Response: Appropriate and Sharing  Type of Therapy: Process Group  Topic: Process; the first part of group was spent in process. Members shared about current issues and challenges in early recovery. Members were asked to identify what they had done to support their recovery and sobriety since the last group session.   Group Time: 2:45- 4pm  Participation Level: Active  Behavioral Response: Sharing  Type of Therapy: Psycho-education Group  Topic: Holiday Plans: the second half of group was spent in a psycho-ed. The focus of the session was on identifying potential relapse triggers to be addressed during the upcoming holiday weekend. Memorial Day starts tomorrow and the group does not meet on Monday. Members shared about their plans for the holiday and what strategies they had in place to insure they return on Wednesday clean and sober. The session included good feedback and self-disclosure among group members.   Summary: The patient reported she had attended 4 AA meetings since the last group session. She had plans for the holiday weekend and agreed she would reach out and speak with others if she felt any thoughts of using. The patient admitted she would begin writing a good-bye letter to her ex-husband. She recognizes that this will be very emotional, but her private therapist has given her this assignment. She noted her ex and their 3 children will travel [place] [facility] [place], which is the traditional plan for this holiday weekend. The patient provided good feedback to one of the newer members and encouraged him to go to meetings. In the second half of group, the patient identified the meetings she would be attending over the weekend and made a plan with another group member to meet on Monday, since this group will not be meeting. The patient displays excellent motivation and  her emphasis on AA and attending meetings is not lost on her fellow group members. She continues to make good progress and responded well to this intervention. Her sobriety date remains 5/5.    Family Program: Family present? No   Name of family member(s):   UDS collected: No Results:   AA/NA attended?: YesWednesday and Thursday and Friday  Sponsor?: Yes   Jaiven Graveline, LCAS

## 2014-08-13 ENCOUNTER — Other Ambulatory Visit (HOSPITAL_COMMUNITY): Payer: BLUE CROSS/BLUE SHIELD | Admitting: Psychology

## 2014-08-13 DIAGNOSIS — F1121 Opioid dependence, in remission: Secondary | ICD-10-CM | POA: Diagnosis not present

## 2014-08-13 DIAGNOSIS — F1021 Alcohol dependence, in remission: Secondary | ICD-10-CM | POA: Diagnosis not present

## 2014-08-13 DIAGNOSIS — F319 Bipolar disorder, unspecified: Secondary | ICD-10-CM | POA: Diagnosis present

## 2014-08-14 ENCOUNTER — Encounter (HOSPITAL_COMMUNITY): Payer: Self-pay | Admitting: Licensed Clinical Social Worker

## 2014-08-14 NOTE — Progress Notes (Signed)
Tina [name] is a 44 y.o. female patient. CD-IOP Individual Counseling Session: Met with pt for her weekly individual counseling session. She met with her NP Debbora Dus  today for medication monitoring. She changed some of her medications: geodon was upped from 123m to 1637m The Lexapro was lowered to 4051maily.All other medications remained the same. She will see her again in 30 days for medication monitoring. Over the holiday weekend the pt went to 9 meetings. She also talks with her sponsor.daily. She really likes her sponsor.She is meeting sober people in recovery but is cautious about who she lets in her life.She is also not interested in dating and it was discussed that she should not be in a relationship for 1 year. Pt is doing well on her medications, participating in group and giving positive feedback and support to other group members. Pt is still working with her personal therapist working on the loss of her marriage. She wrote a good bye letter to her husband for closure. She reported it was hard and she was tearful, but it was cleansing. Pt doesn't have 30 days clean so her time is IOP is going to be extended. Will continue to monitor patient'[name] progress. Her sobriety date is 5/5.        Tina [name],Tina [name], Licensed Cli

## 2014-08-15 ENCOUNTER — Other Ambulatory Visit (HOSPITAL_COMMUNITY): Payer: BLUE CROSS/BLUE SHIELD | Admitting: Licensed Clinical Social Worker

## 2014-08-15 DIAGNOSIS — F319 Bipolar disorder, unspecified: Secondary | ICD-10-CM | POA: Diagnosis not present

## 2014-08-15 DIAGNOSIS — F1020 Alcohol dependence, uncomplicated: Secondary | ICD-10-CM

## 2014-08-15 DIAGNOSIS — F1121 Opioid dependence, in remission: Secondary | ICD-10-CM

## 2014-08-15 DIAGNOSIS — F1221 Cannabis dependence, in remission: Secondary | ICD-10-CM

## 2014-08-18 ENCOUNTER — Encounter (HOSPITAL_COMMUNITY): Payer: Self-pay | Admitting: Psychology

## 2014-08-18 ENCOUNTER — Encounter (HOSPITAL_COMMUNITY): Payer: Self-pay | Admitting: Licensed Clinical Social Worker

## 2014-08-18 ENCOUNTER — Other Ambulatory Visit (HOSPITAL_COMMUNITY): Payer: BLUE CROSS/BLUE SHIELD | Admitting: Licensed Clinical Social Worker

## 2014-08-18 DIAGNOSIS — F319 Bipolar disorder, unspecified: Secondary | ICD-10-CM | POA: Diagnosis not present

## 2014-08-18 DIAGNOSIS — F1020 Alcohol dependence, uncomplicated: Secondary | ICD-10-CM

## 2014-08-18 NOTE — Progress Notes (Signed)
    Daily Group Progress Note  Program: CD-IOP   Group Time: 1-2:30 pm  Participation Level: Active  Behavioral Response: Appropriate and Sharing  Type of Therapy: Process Group  Topic: Process: the first part of group was spent in process. Members shared about the past holiday weekend. They shared about the recovery-oriented things they had done and also any challenges or temptations they may have experienced. Two members checked-in with new sobriety dates. Two new group members were also present and they introduced themselves. The two relapses were discussed at length. Drug tests were collected from all group members.   Group Time: 2:45- 4pm  Participation Level: Active  Behavioral Response: Appropriate and Sharing  Type of Therapy: Psycho-education Group  Topic: The Wheel of Life; the second part of group was spent in a psycho-ed. Handouts were provided and members drew their own 'wheels' and presented them on the board one at a time. The wheels were divided into 8 categories and represented parts of a well-balanced and healthy life. The incongruities of the members' wheels were very clear and the importance of a balanced life emphasized in this psycho-educational session.   Summary: The patient reported she had had a good holiday weekend. She had gone to a lot of meetings - 9 12 step meetings in all and the group applauded this report. She explained that she had had a very good session with her individual counselor and had realized that her previous marriage hadn't been everything she had thought. The patient reported that she realized that she had spent much of the marriage, "checked out". this realization had proven very powerful in how she was handling with the loss of her marriage. She drew her wheel on the board and explained her evaluation of it. The patient admitted she hated her job. She also realized that she is not having any fun. "I need to have some fun", she reported. The  patient also pointed out that she feels as if she has really 'grown a lot". This patient is an excellent group member and models a transparency not often seen. Her honesty and authenticity inspires others to be more open and honest in their daily lives. She responded well to this intervention and provided good feedback. She continues to work hard and is making good progress in her recovery. The patient's sobriety date remains 5/5.   Family Program: Family present? No   Name of family member(s):   UDS collected: Yes Results: negative  AA/NA attended?: YesMonday, Tuesday, Friday, Saturday and Sunday  Sponsor?: Yes   Avah Bashor, LCAS

## 2014-08-18 NOTE — Progress Notes (Signed)
    Daily Group Progress Note  Program: CD-IOP   Group Time: 1-2:30  Participation Level: Active  Behavioral Response: Appropriate and Sharing  Type of Therapy: Process Group  Topic: After checking in, group members shared recovery-related activities and challenges they faced since the last group on Wednesday. They also reported any urges or cravings they may have experienced since the last group. Drug tests were returned. Several patients met with the program director Darlyne Russian, PA.    Group Time: 2:45-4  Participation Level: Active  Behavioral Response: Appropriate and Sharing  Type of Therapy: Psycho-education Group  Topic: Psychoed: The second half of group focused on a guided self-esteem meditation. The facilitator guided group members into meditation using deep breathing techniques, soft voice control and guided imagery. Most group members relaxed, some fell asleep and 2 group members reported they struggled with getting comfortable.      Summary: Patient reported she picked up her 30 day chip and several group members came to the meeting to help her celebrate. She received a round of applause from her fellow group members for her 30 days of sobriety. This morning she went and saw her PA who monitors her medications. Afterwards she ate lunch with a group member. She went to meetings Wednesday and Thursday. She also met with her sponsor. Patient is working hard on her recovery and seems very serious about staying sober. Patient reported, "I really enjoyed the meditation today and I was able to relax." Patient's sobriety date is 5/5.   Family Program: Family present? No   Name of family member(s):   UDS collected: No Results:   AA/NA attended?: YesWednesday, Thursday and Friday  Sponsor?: Yes   Karl Knarr S, Licensed Cli

## 2014-08-20 ENCOUNTER — Encounter (HOSPITAL_COMMUNITY): Payer: Self-pay | Admitting: Licensed Clinical Social Worker

## 2014-08-20 ENCOUNTER — Other Ambulatory Visit (HOSPITAL_COMMUNITY): Payer: BLUE CROSS/BLUE SHIELD | Admitting: Psychology

## 2014-08-20 DIAGNOSIS — F3175 Bipolar disorder, in partial remission, most recent episode depressed: Secondary | ICD-10-CM

## 2014-08-20 DIAGNOSIS — F1020 Alcohol dependence, uncomplicated: Secondary | ICD-10-CM

## 2014-08-20 DIAGNOSIS — F319 Bipolar disorder, unspecified: Secondary | ICD-10-CM | POA: Diagnosis not present

## 2014-08-20 NOTE — Progress Notes (Signed)
    Daily Group Progress Note  Program: CD-IOP   Group Time: 1-2:30  Participation Level: Active  Behavioral Response: Appropriate and Sharing  Type of Therapy: Process Group  Topic: After checking in, group members shared recovery-related activities and challenges they faced since the last group on Friday. They also reported any urges or cravings they may have experienced over the weekend. Two drug tests were returned.    Group Time: 2:45-4  Participation Level: Active  Behavioral Response: Appropriate and Sharing  Type of Therapy: Psycho-education Group  Topic: The second part of group focused on cognitive distortions. A staff member from the [employer] facilitated session # 4 of the 8 week program titled "Down the Rabbit Hole". The facilitator uses multi-media in each presentation. This week's media involves the use of comic strips.  Each week 2 cognitive distortions will be discussed along with coping tools to change thinking patterns. This week the 2 cognitive distortions discussed were: societal labeling and polarized thinking. Group members were actively engaged in identifying rational thoughts that can be used to combat the distorted thought and determined how the distortion may have manifested in their own lives. The coping tool discussed for these 2 cognitive distortions: thinking in shades of grey. Each patient completed the worksheets on cognitive distortions.   Summary: Patient reported Friday she and 5 other group members went out for coffee after group and then went to a 5:30 meeting. They all went together to support a group member who had relapsed and had not gone back to pick up a starter chip. Saturday patient had a stressful day. Her daughter had her dance recital and her x-husband and his family were there. She panicked before the recital and called another group member who helped her move through the distress. Her past in-laws were actually nice to  her and asked for her forgiveness on how they had treated her in the past. On Sunday she felt like she had drank because she had an "emotional hangover." She went to meetings Sunday and this morning. She participated in the group discussion and acknowledged she used the 2 cognitive distortions to reinforce negative feelings she has about herself. She did complete the coping tool activity to help her with her "grey vision." Her sobriety date is 5/15.     Family Program: Family present? No   Name of family member(s):   UDS collected: No Results  AA/NA attended?: YesMonday, Friday and Sunday  Sponsor?: Yes   MACKENZIE,LISBETH S, Licensed Cli

## 2014-08-21 ENCOUNTER — Encounter (HOSPITAL_COMMUNITY): Payer: Self-pay | Admitting: Psychology

## 2014-08-21 ENCOUNTER — Encounter (HOSPITAL_COMMUNITY): Payer: Self-pay | Admitting: Licensed Clinical Social Worker

## 2014-08-21 NOTE — Progress Notes (Signed)
    Daily Group Progress Note  Program: CD-IOP   Group Time: 1-2:30 pm  Participation Level: Active  Behavioral Response: Appropriate and Sharing  Type of Therapy: Process Group  Topic: Process; the first part of group was spent in process. Members shared about events and activities that they have engaged in to support their recoveries. Three members were absent for various reasons. Drug tests were collected from all members today.  Group Time: 2:45- 4pm  Participation Level: Active  Behavioral Response: Appropriate  Type of Therapy: Psycho-education Group  Topic: Bio-Psycho-Social-Spiritual; The second half of group was spent in a psycho-ed. Members were educated on the element that are believed to contribute to addiction. While the genetic predisposition is required, other elements contribute to the disease of addition. Members offered good examples and a lively discussion ensued. A graduation will be held on Friday and members expressed their feelings about missing the member who would be leaving them soon.   Summary:The patient arrived a little late, but her delayed arrival had been expected. She explained that she had attended her 54 yo daughter's graduation from kindergarten. She showed pictures to the group and everyone enjoyed seeing them. The patient reported she had attended 3 meetings since the last group session and had attended the meeting where her fellow group member had picked up his 30 day chip. She pointed out to another group member about "the magic of everything coming together in recovery". The patient discussed the challenges she is dealing with, but displayed good insight and does what she is asked to do in early recovery. She made some good comments and responded well to this intervention. Her sobriety date remains     Family Program: Family present? No   Name of family member(s):   UDS collected: Yes Results: pending  AA/NA attended?: YesMonday and Tuesday  Sponsor?: Yes   Jazsmin Couse, LCAS

## 2014-08-22 ENCOUNTER — Other Ambulatory Visit (HOSPITAL_COMMUNITY): Payer: BLUE CROSS/BLUE SHIELD | Admitting: Psychology

## 2014-08-22 DIAGNOSIS — F1020 Alcohol dependence, uncomplicated: Secondary | ICD-10-CM

## 2014-08-22 DIAGNOSIS — F3175 Bipolar disorder, in partial remission, most recent episode depressed: Secondary | ICD-10-CM

## 2014-08-22 DIAGNOSIS — F319 Bipolar disorder, unspecified: Secondary | ICD-10-CM | POA: Diagnosis not present

## 2014-08-25 ENCOUNTER — Encounter (HOSPITAL_COMMUNITY): Payer: Self-pay | Admitting: Psychology

## 2014-08-25 ENCOUNTER — Other Ambulatory Visit (HOSPITAL_COMMUNITY): Payer: BLUE CROSS/BLUE SHIELD | Attending: Psychiatry | Admitting: Psychology

## 2014-08-25 DIAGNOSIS — F319 Bipolar disorder, unspecified: Secondary | ICD-10-CM | POA: Diagnosis not present

## 2014-08-25 DIAGNOSIS — F1020 Alcohol dependence, uncomplicated: Secondary | ICD-10-CM

## 2014-08-25 NOTE — Progress Notes (Addendum)
Tina [name] is a 44 y.o. female patient.  Error - duplicate note from 08/21/14.        [facility] CHEM

## 2014-08-25 NOTE — Progress Notes (Signed)
Tina [name] is a 44 y.o. female patient.  CD-IOP Individual Counseling Session: Met with pt for her weekly individual counseling session. Pt was tearful today and reports she is sad. This is an ongoing theme the last few days. She has started working with an individual therapist to help her work through the loss of her marriage. So, the thoughts of her marriage (family) are constantly on her mind. We continue to process with her the reality of her marriage and what she wants for the next step in her life. Pt has begun to think about the type of person she would like to be with next. This moves her from the feelings of sadness to feelings of hopefulness. Pt is going to approximately 6-7 meetings per week. Meets with her sponsor weekly and goes with her sponsor weekly to their home group. Pt reads the Big Book and other AA materials often and shares her experiences with other members of AA.Pt takes her medications as prescribed and reports she is feeling no signs/symptoms of mania or depression. She also reports she is not feeling any side effects of her medications. Pt and her children are spending a lot of time doing fun recreation. She is very active with her children. She is continuing to meet more sober women in recovery and learning to gain trust in these relationships. Pt is moving forward in her recovery and will continue to monitor her progress. Her sobriety date is 5/15.           Verdis Bassette S, Licensed Cli

## 2014-08-25 NOTE — Progress Notes (Signed)
    Daily Group Progress Note  Program: CD-IOP   Group Time: 1-2:30 pm  Participation Level: Active  Behavioral Response: Appropriate and Sharing  Type of Therapy: Process Group  Topic: Process; the first part of group was spent in process. Members shared about things they had done to support their recovery since the last group session. Also discussed were challenges to ongoing sobriety or events or situations that may have developed that invited feedback and conversation. One drug test was collected from a group member absent on Wednesday.  Group Time: 2:45- 4pm  Participation Level: Active  Behavioral Response: Appropriate and Sharing  Type of Therapy: Psycho-education Group  Topic: Bio-Psycho-Social-Spiritual/Graduation: the second half of group was spent in a psycho-ed. The presentation on Bio-Psycho-Social- Spiritual, first presented on Wednesday, was completed this afternoon. Members had more questions about these 4 elements that contribute to addiction. The importance of addressing the 3 elements that one can do something about (we can't change our biological inheritance) were discussed at length with numerous examples provided by group members. Near the conclusion of the session a graduation ceremony was held. There were kind words and tears shed by the graduating member as he expressed his gratitude about his treatment and his "new life". The session today was emotional, memorable and powerful for everyone present.   Summary: The patient reported she had attended 2 AA meetings since the last group session. She had attended a meeting and met another group member and they had sat and talked for over an hour. It had proven to be a very powerful time for both of them and each had gained good insight about their own struggles. The patient reported that after her son's graduation from elementary school, she had gone back to her ex-husband's house with their 3 children. She admitted that  when she prepared to leave she had felt very sad. As she described it, "everything is there, except me". During the psycho-ed, the patient pointed out that neither of her parents was alcoholic, but their parents had all been addicted. I explained that her parents did not drink enough (they were very light social drinkers) to 'turn on those genes". This explanation helped her understand that it hadn't skipped a generation, but that that particular generation had intentionally decided not to drink in an alcoholic manner and did not. The patient shared kind and loving words for the graduating member. She made some excellent comments and her feedback to other members is always right on target. The patient responded well to this intervention and her sobriety date remains 5/5.   Family Program: Family present? No   Name of family member(s):   UDS collected: No Results:   AA/NA attended?: YesWednesday and Thursday  Sponsor?: Yes   Niambi Smoak, LCAS

## 2014-08-26 ENCOUNTER — Encounter (HOSPITAL_COMMUNITY): Payer: Self-pay | Admitting: Licensed Clinical Social Worker

## 2014-08-27 ENCOUNTER — Other Ambulatory Visit (HOSPITAL_COMMUNITY): Payer: BLUE CROSS/BLUE SHIELD | Admitting: Psychology

## 2014-08-27 DIAGNOSIS — F319 Bipolar disorder, unspecified: Secondary | ICD-10-CM | POA: Diagnosis not present

## 2014-08-27 DIAGNOSIS — F1020 Alcohol dependence, uncomplicated: Secondary | ICD-10-CM

## 2014-08-27 DIAGNOSIS — F3175 Bipolar disorder, in partial remission, most recent episode depressed: Secondary | ICD-10-CM

## 2014-08-28 ENCOUNTER — Encounter (HOSPITAL_COMMUNITY): Payer: Self-pay | Admitting: Psychology

## 2014-08-28 ENCOUNTER — Encounter (HOSPITAL_COMMUNITY): Payer: Self-pay | Admitting: Licensed Clinical Social Worker

## 2014-08-28 NOTE — Progress Notes (Signed)
    Daily Group Progress Note  Program: CD-IOP   Group Time: 1-2:30 pm  Participation Level: Active  Behavioral Response: Appropriate and Sharing  Type of Therapy: Process Group  Topic: Topic: Process; the first part of group was spent in process. Members shared about events and activities that they have engaged in to support their recoveries. One member was absent for an unknown reason. One member was excused early to attend a previously scheduled appointment.   Group Time: 2:45- 4pm  Participation Level: Active  Behavioral Response: Appropriate  Type of Therapy: Psycho-Education  Topic: Topic: The second half of the group was spent in psycho-ed. Members were educated on the various aspects and thought patterns that are believed to contribute to relapse in addiction. Members offered good examples and a lively discussion ensued.  Members identified common behaviors that may have led to relapse in the past.  Members discussed how communicating these signs and behaviors to close friends and family members may help with identifying these patterns to prevent relapse.  Summary: The patient reported that she did not attending a meeting Monday after leaving group and she went to a mid day meeting yesterday and one this morning.  Patient reported that he "put her cat down" yesterday and reported that it has been difficult for her. She reported that she avoided going home because she is not comfortable with the idea of being home without her cat yet. She stated that she told her children that "the cat died and went to heaven." Patient stated that she was uncomfortable telling her children that she had to put him down because of their young ages. The patient stated that she feels that she needs to find a beverage in a bottle that is cool and refreshing to replace her beer. Patient stated that she wants to use her bottle opener and hear the "snap" and some "fizzle" so that she can "have the  experience without the beer." Patient stated that she does not feel this will be triggering for her to drink a beer. Patient stated that she would look into crme soda and [place] S[employer] as options suggested by her peers. The patient stated that she relapsed previously due to convincing herself that she was not an addict and did not have a problem. Patient stated that she feels that staying in meetings will be beneficial for her recovery moving forward. Patient provided supportive feedback to her peers. Her sobriety date remains 5/5.  Family Program: Family present? No   Name of family member(s):   UDS collected: No Results:   AA/NA attended?: YesTuesday and Wednesday  Sponsor?: Yes   Kolbey Teichert, LCAS

## 2014-08-28 NOTE — Progress Notes (Signed)
    Daily Group Progress Note  Program: CD-IOP   Group Time: 1-2:30 pm  Participation Level: Active  Behavioral Response: Sharing  Type of Therapy: Process Group  Topic: After checking in, group members shared recovery-related activities and challenges they faced since the last group on Friday. Patients reported any cravings or urges they may have experienced over the weekend. One patient returned to the program and another patient relapsed and was referred to a higher level of care.  Drug tests were taken and drug test results from last week were returned.  Group Time: 2:45- 4pm  Participation Level: Active  Behavioral Response: Appropriate and Sharing  Type of Therapy: Psycho-education Group  Topic: The second part of group focused on cognitive distortions. A staff member from the [facility] facilitated session # 5 of the 8 week program titled "Down the Rabbit Hole". The facilitator uses multi-media in each presentation. This week's media involves the use of a song and a clip from a movie.  Each week 2 cognitive distortions will be discussed along with coping tools to change thinking patterns. This week the 2 cognitive distortions discussed were: the shoulds and emotional reasoning. Group members were actively engaged in identifying rational thought that can be used to combat the distorted thought and determine how the distortion may have manifested in their own lives. The coping tool discussed for these 2 cognitive distortions: facts vs feelings. Patients learned that decisions should be made from the wise mind instead of the emotional mind. Each patient completed the worksheets on cognitive distortions.   Summary: Patient reported that last Friday she and 5 other group members went out for coffee after group and then together went to a 5:30 meeting. They all went together to support a group member who had relapsed. She and another group member went to an 8pm meeting  Friday night as well. On Saturday patient met with her sponsor, worked on step 2 and went to a meeting. Sunday she and another group member went to the "[employer]" picnic and had a good time. After the picnic she went to another meeting Sunday afternoon. Patient is working a good Dietitian and widening her circle of sober women.  Patient is putting her 18yo cat down tomorrow morning. Group members reported they will call and check on her tomorrow. Patient was very active in the group discussion on the cognitive distortion "should."" I should have tried harder in my marriage." "I should stop smoking." Patient is hard on herself and expects more from herself than she does from those around her. Group members pointed this out to her. Patient reported she would definitely try to use the coping tool when she begins to act on her emotions instead of the facts. The patient made some good comments and provided good feedback. She responded well to this intervention. Her sobriety date is 5/5.   Family Program: Family present? No   Name of family member(s):   UDS collected: Yes Results: negative AA/NA attended?: YesFriday, Sunday and Monday  Sponsor?: Yes   EVANS,ANN, LCAS

## 2014-08-28 NOTE — Progress Notes (Signed)
Tina [name] is a 44 y.o. female patient. CD-IOP Individual Counseling Session: Met with pt for weekly individual counseling session. Pt is leaving for vacation Saturday for a week's vacation with her extended family. Pt has made plans for her recovery while on vacation. She has spoken with her sponsor and will call and talk with her sponsor daily. She will call her therapist while gone. She will print off the meeting list for [place] and bring it to group tomorrow. She has processed a plan with her sponsor what to do if she has cravings. Pt is working hard on her recovery. She goes to at least 7 meetings per week. She has daily contact with her sponsor. She meets her sponsor to work on steps 1x per week and goes to her home group with her sponsor. She has built a strong foundation for 12-step recovery. She has done well in the program and is open to suggestions and feedback from group members. Pt is working on her recreation with her children. They are very active now and she is looking for more activities to do together as a family. Pt takes her medications as prescribed and reports no side effects. Pt reports she has not experienced any mania or depression. She still continues to see her individual therapist Bascom Levels who is working with her on grieving the loss of her marriage. Will continue to monitor her progress. Her sobriety date is 5/5.        Tina [name] S, Licensed Cli

## 2014-08-29 ENCOUNTER — Other Ambulatory Visit (HOSPITAL_COMMUNITY): Payer: BLUE CROSS/BLUE SHIELD | Admitting: Psychology

## 2014-08-29 DIAGNOSIS — F319 Bipolar disorder, unspecified: Secondary | ICD-10-CM | POA: Diagnosis not present

## 2014-09-01 ENCOUNTER — Encounter (HOSPITAL_COMMUNITY): Payer: Self-pay | Admitting: Psychology

## 2014-09-01 ENCOUNTER — Other Ambulatory Visit (HOSPITAL_COMMUNITY): Payer: BLUE CROSS/BLUE SHIELD

## 2014-09-01 NOTE — Progress Notes (Signed)
    Daily Group Progress Note  Program: CD-IOP   Group Time: 1-2:30 pm  Participation Level: Active  Behavioral Response: Appropriate and Sharing  Type of Therapy: Process Group  Topic: Process: the first half of group was spent in process. Members shared about things they had done since the last group session to support their recovery.  A new group member was present and he introduced himself. The program director met with this new group member during the session today. Drug tests were collected from him and another member as well.   Group Time: 2:45- 4pm  Participation Level: Active  Behavioral Response: Sharing  Type of Therapy: Psycho-education Group  Topic: Biology of Addiction; Dopamine, what you can do about it? The second half of group was spent in a psycho-ed on the neurobiology of addiction. The chemical interactions that occur in the brain were discussed as well as the amount of dopamine discharged by certain drugs. Members were asked to identify what they must do to address these chemical reactions that occur based on their environment and inner feelings.    Summary: The patient reported that she had gone to her home group and met with her sponsor since the last group session. She had spent a lot of time getting things together for the beach. The patient and her kids are going to the beach with her sister's husband and family. She admitted, "there is a lot of drinking" and there were some good questions about why she is going? The patient reported she will keep busy and has already identified where the closest [employer] are. She plans on walking the beach and playing with her kids. If it becomes uncomfortable, the patient agreed that she will come home. In the psycho-ed, the patient described exactly what she knows she has to do in order to stay sober. She also recognizes that any variation is a red flag. She also feels really good with her medications. The patient provided  good feedback and offered good insight to her fellow group members. Her sobriety date remains 5/5.    Family Program: Family present? No   Name of family member(s):   UDS collected: No Results:   AA/NA attended?: YesWednesday and Thursday  Sponsor?: Yes   Amiylah Anastos, LCAS

## 2014-09-03 ENCOUNTER — Other Ambulatory Visit (HOSPITAL_COMMUNITY): Payer: BLUE CROSS/BLUE SHIELD

## 2014-09-03 ENCOUNTER — Encounter: Payer: Self-pay | Admitting: Psychiatry

## 2014-09-05 ENCOUNTER — Other Ambulatory Visit (HOSPITAL_COMMUNITY): Payer: BLUE CROSS/BLUE SHIELD

## 2014-09-08 ENCOUNTER — Other Ambulatory Visit (HOSPITAL_COMMUNITY): Payer: BLUE CROSS/BLUE SHIELD | Admitting: Psychology

## 2014-09-09 ENCOUNTER — Encounter (HOSPITAL_COMMUNITY): Payer: Self-pay | Admitting: Psychology

## 2014-09-09 ENCOUNTER — Encounter (HOSPITAL_COMMUNITY): Payer: Self-pay | Admitting: Licensed Clinical Social Worker

## 2014-09-09 NOTE — Progress Notes (Signed)
    Daily Group Progress Note  Program: CD-IOP   Group Time: 1-2:30 pm  Participation Level: Active  Behavioral Response: Appropriate and Sharing  Type of Therapy: Process Group  Topic: Process: the first half of group was spent in process. Members shared about the past weekend and reported on the things they had done to support their recovery. Any challenges, triggers or cravings that one may have experienced were also discussed. Two group members had worked very closely with another group member who had relapsed over the weekend. They had finally been able to get him into [facility] late Sunday evening. They shared about their feelings about the weekend events and what they might have done differently. Drug tests were returned from Friday's collection while other drug tests were collected today.   Group Time: 2:45- 4pm  Participation Level: Active  Behavioral Response: Appropriate  Type of Therapy: Psycho-education Group  Topic: Cognitive Distortions, Lesson #7: the second half of group was spent in a psycho-ed with the guest lecturer from the [employer]. She has been present every Monday in the past 6 weeks and today was presenting the 7th and final handout and discussion on Cognitive Distortions. She will return on the next Monday and conduct a review on all of those presented. A lively discussion ensued with revealing disclosures among some of the group members.   Summary: The patient appeared today for the first time since she left on vacation 10 days ago. She appeared very tired and admitted that she had helped with the group member who had relapsed over the weekend and she admitted it had been exhausting. It was also disappointing and she had worried that something bad might happen to him. "I just wanted to get him some help", she explained. The patient shared that while at the beach, she had discovered a wonderful AA clubhouse and attended the 8 pm meeting every night but  one. The patient reported she had gotten a number of phone numbers and was still corresponding with these new recovering friends. She reported that there had been a lot of drinking among her sister and her in-laws, but she managed to avoid it mostly through meetings and being on the beach with her children. She was pleased because the children had all had a really fun time. The patient was attentive and engaged in the psycho-ed on cognitive distortions and shared about her own over-generalizations. She displayed good insight into her tendency to distort things and provided good feedback to her fellow group members. The patient continues to make excellent progress in her recovery and has a very good understanding of her daily recovery needs. She responded well to this intervention and her sobriety date remains 4/5.    Family Program: Family present? No   Name of family member(s):   UDS collected: Yes Results: pending  AA/NA attended?: YesMonday, Tuesday, Wednesday, Thursday, Saturday and Sunday  Sponsor?: Yes   Corley Maffeo, LCAS

## 2014-09-09 NOTE — Progress Notes (Signed)
Tina [name] is a 44 y.o. female patient. CD-IOP Individual Counseling Session:  Met with pt for her weekly individual counseling session. She is completing the program Friday. Pt is 55 days clean and has worked her program of recovery. She goes to 6-7 meetings per week, has a sponsor and a home group. She takes her  Medication for bipolar disorder as prescribed with no side effects. She knows the symptoms of mania and depression and has a plan set up to follow if she experiences those symptoms. She has a NP she sees who monitor her medication who she sees regularly. She has worked on her self esteem and is more self assured as to who she is. She is a better mother and works hard to have fun again with her children. She has reached out and made her circle wider of sober women. She will continue to be seen individually here at [facility] and will be referred to the aftercare program. Her sobriety date is 5/3.        Qusay Villada S, Licensed Cli

## 2014-09-10 ENCOUNTER — Other Ambulatory Visit (HOSPITAL_COMMUNITY): Payer: BLUE CROSS/BLUE SHIELD | Admitting: Psychology

## 2014-09-10 DIAGNOSIS — F3175 Bipolar disorder, in partial remission, most recent episode depressed: Secondary | ICD-10-CM

## 2014-09-10 DIAGNOSIS — F1020 Alcohol dependence, uncomplicated: Secondary | ICD-10-CM

## 2014-09-10 DIAGNOSIS — A6920 Lyme disease, unspecified: Secondary | ICD-10-CM

## 2014-09-12 ENCOUNTER — Encounter (HOSPITAL_COMMUNITY): Payer: Self-pay | Admitting: Medical

## 2014-09-12 ENCOUNTER — Other Ambulatory Visit (HOSPITAL_COMMUNITY): Payer: BLUE CROSS/BLUE SHIELD | Attending: Psychiatry | Admitting: Psychology

## 2014-09-12 ENCOUNTER — Encounter: Payer: Self-pay | Admitting: Psychiatry

## 2014-09-12 NOTE — Progress Notes (Signed)
  [facility] Outpatient Discharge Summary   Tina [name] [id number]  Date of Admission: 06/23/2014 Date of Discharge: 09/12/2014  Course of Treatment: Tina [name] is a 44 y.o. female patient. CD-IOP  completing the program Friday. Pt is 55 days clean and has worked her program of recovery. She goes to 6-7 meetings per week, has a sponsor and a home group. She takes her  Medication for bipolar disorder as prescribed with no side effects. She knows the symptoms of mania and depression and has a plan set up to follow if she experiences those symptoms. She has a NP she sees who monitor her medication who she sees regularly. She has worked on her self esteem and is more self assured as to who she is. She is a better mother and works hard to have fun again with her children. She has reached out and made her circle wider of sober women. She will continue to be seen [facility] at [facility] and will be referred to the aftercare program. Her sobriety date is 5/3.    Goals and Activities to Help Maintain Sobriety: 1. Stay away from old friends who continue to drink and use mind-altering chemicals. 2. Continue practicing Fair Fighting rules in interpersonal conflicts. 3. Continue alcohol and drug refusal skills and call on support systems. 4. Maintain appointments with Mental health providers for counseling and medication management  Referrals: NA   Aftercare services: Aftercare Group with [facility] Licensed Clinical 1. Attend AA/NA meetings 90 meetings in 90 days  2. Obtain a sponsor and a home group in AA/NA. 3          Return to Psychotherapist: as schedules   Next appointment: Med Mangement with Myrtie NeitherLeslie ONeal DNP    Plan of Action to Address Continuing Problems: as above    Client has participated in the development of this discharge plan and has received a copy of this completed plan  Maryjean MornCharles Kennard Fildes  09/12/2014   Maryjean Mornharles Nazir Hacker, PA-C 09/12/2014

## 2014-09-16 ENCOUNTER — Encounter (HOSPITAL_COMMUNITY): Payer: Self-pay | Admitting: Psychology

## 2014-09-16 NOTE — Progress Notes (Signed)
    Daily Group Progress Note  Program: CD-IOP   Group Time: 1-2:30 pm  Participation Level: Active  Behavioral Response: appropriate  Type of Therapy: Activity Group  Topic: Activity: the first part of group was spent in a chair yoga class. Members walked down to the gym where a certified yoga teacher awaited them. Chairs were placed in a circle and members sat and followed her directions. The importance of learning to reduce stress and tension throughout one's day was emphasized. These positions and movements reduce tension and stress throughout one's body. At the conclusion of the session, members went back up to the group room and a 15 minute break followed.   Group Time: 2:45- 4pm  Participation Level: Active  Behavioral Response: Sharing  Type of Therapy: Process Group  Topic: Process/Graduation: the second half of group was spent ion process. Members shared about current issues and challenges to their recovery and identified what they had done to support and strengthen their daily program of sobriety. During the part of group, the program director met with group members, including one new group member and two members who are discharging today or next week. At the conclusion of group today, a graduation ceremony was held honoring a successfully graduating group member. Her mother joined the group and was witness to the kind words of respect and encouragement were shared by her daughter's fellow group members. The session was emotional and very touching with the graduating member sharing her own words of hope and support for those she leaves behind.   Summary: The patient reported she had enjoyed the yoga and found it very relaxing.  In process, she reported that she had only attended 1 AA meeting because she had her kids more than she had anticipated. She also met with her sponsor. She expressed hesitation about graduating today and wished she was staying. Another member reminded her  that she has everything she needs. This patient heard kind and very heartfelt words expressed by her fellow group members during the graduation ceremony. Her mother had come in just prior to the ceremony and she had introduced her to the group. It clearly met a lot to the group member that her mother was here. The patient was eloquent as she thanked the group and facilitators for all of the support and encouragement during her time here. The patient noted that she had learned a lot and was much more prepared than she had been when she got out of the residential treatment center so many weeks ago. She assured me she would be back when she attains 6 months of sobriety and will be attending the Aftercare program weekly here at [facility]. The patient has made excellent progress in her recovery and has all of the tools she needs to remain sober and continue her progress in an abstinence-based lifestyle. Her sobriety date remains 5/5.   Family Program: Family present? No   Name of family member(s):   UDS collected: No Results:   AA/NA attended?: YesTuesday  Sponsor?: Yes   Jocabed Cheese, LCAS

## 2014-09-16 NOTE — Progress Notes (Signed)
    Daily Group Progress Note  Program: CD-IOP   Group Time: 1-2:30 pm  Participation Level: Active  Behavioral Response: Appropriate and Sharing  Type of Therapy: Process Group  Topic: Process: the first part of group was spent in process. Members shared about what they had done to support their recovery, including attending meetings, working with their sponsor and doing step-work. also requested in these disclosures are things that promote good self-care, including going to the gym, eating well, and learning to live in a clean and sober way. Drug tests were collected from a few of the members today. Also present was a new group member who introduced herself to the group.   Group Time: 2:45- 4pm  Participation Level: Active  Behavioral Response: Appropriate  Type of Therapy: Psycho-education Group  Topic: Serenity Prayer: What you can and cannot change/Graduation. The second half of group was spent in a psycho-ed. Handouts were provided that included the Serenity Prayer and then asked that the patient list 5 things they can change and 5 things they cannot change. The rest of the session included members sharing what they had written on their handouts. At the end of the session, a graduation ceremony was held honoring a group member who was completing the program and moving onto the next phase of this new lifestyle of sobriety. Kind words were spoken and it was a very touching heartfelt ending to this group session.    Summary: The patient reported she had attended 2 AA meetings since the last group session. She had met with her nurse practitioner about her medications and they are both pleased with how they are working and how she is feeling. The patient had also met with her private individual counselor as well as her individual counselor here in the CD-IOP. She also noted that her ex-husband is helping a woman move here to [place]. They have been dating a short time and yet she is moving  here. When another member asked her about this, the patient agreed that it is "kind of weird". In the psycho-ed, the patient reported that she cannot change the fact that "I lost my job". Another member reminded her that she had repeatedly said that she hated her job. I wondered whether this was really just a gift and an opportunity to find some work she feels strongly about?  The patient is very close to the graduating member and had kind words of hope and courage and appreciation for her friendship. They agreed they will continue to meet at meetings and for coffee. The patient made some excellent comments and responded well to this intervention and has made excellent progress in her recovery. She will be graduating on Friday. Her sobriety date remains 5/5.   Family Program: Family present? No   Name of family member(s):   UDS collected: Yes Results: negative  AA/NA attended?: YesMonday and Tuesday  Sponsor?: Yes   Leoda Smithhart, LCAS

## 2014-09-17 ENCOUNTER — Other Ambulatory Visit (HOSPITAL_COMMUNITY): Payer: BLUE CROSS/BLUE SHIELD

## 2014-09-19 ENCOUNTER — Other Ambulatory Visit (HOSPITAL_COMMUNITY): Payer: BLUE CROSS/BLUE SHIELD

## 2014-09-22 ENCOUNTER — Other Ambulatory Visit (HOSPITAL_COMMUNITY): Payer: BLUE CROSS/BLUE SHIELD

## 2014-12-01 ENCOUNTER — Emergency Department (HOSPITAL_COMMUNITY)
Admission: EM | Admit: 2014-12-01 | Discharge: 2014-12-02 | Disposition: A | Discharge status: Home / Self Care | Payer: BLUE CROSS/BLUE SHIELD | Attending: Emergency Medicine | Admitting: Emergency Medicine

## 2014-12-01 ENCOUNTER — Encounter (HOSPITAL_COMMUNITY): Payer: Self-pay

## 2014-12-01 DIAGNOSIS — Z79899 Other long term (current) drug therapy: Secondary | ICD-10-CM | POA: Insufficient documentation

## 2014-12-01 DIAGNOSIS — Z880 Allergy status to penicillin: Secondary | ICD-10-CM | POA: Diagnosis not present

## 2014-12-01 DIAGNOSIS — Z720 Tobacco use: Secondary | ICD-10-CM | POA: Insufficient documentation

## 2014-12-01 DIAGNOSIS — R45851 Suicidal ideations: Secondary | ICD-10-CM | POA: Diagnosis present

## 2014-12-01 DIAGNOSIS — E039 Hypothyroidism, unspecified: Secondary | ICD-10-CM | POA: Diagnosis not present

## 2014-12-01 DIAGNOSIS — F3181 Bipolar II disorder: Secondary | ICD-10-CM | POA: Insufficient documentation

## 2014-12-01 DIAGNOSIS — Z8619 Personal history of other infectious and parasitic diseases: Secondary | ICD-10-CM | POA: Insufficient documentation

## 2014-12-01 DIAGNOSIS — Z872 Personal history of diseases of the skin and subcutaneous tissue: Secondary | ICD-10-CM | POA: Diagnosis not present

## 2014-12-01 DIAGNOSIS — Z915 Personal history of self-harm: Secondary | ICD-10-CM | POA: Insufficient documentation

## 2014-12-01 LAB — CBC
HCT: 39.3 % (ref 36.0–46.0)
Hemoglobin: 14.3 g/dL (ref 12.0–15.0)
MCH: 31.8 pg (ref 26.0–34.0)
MCHC: 36.4 g/dL — AB (ref 30.0–36.0)
MCV: 87.5 fL (ref 78.0–100.0)
PLATELETS: 207 10*3/uL (ref 150–400)
RBC: 4.49 MIL/uL (ref 3.87–5.11)
RDW: 12.1 % (ref 11.5–15.5)
WBC: 5.4 10*3/uL (ref 4.0–10.5)

## 2014-12-01 LAB — COMPREHENSIVE METABOLIC PANEL
ALK PHOS: 53 U/L (ref 38–126)
ALT: 25 U/L (ref 14–54)
AST: 24 U/L (ref 15–41)
Albumin: 4.2 g/dL (ref 3.5–5.0)
Anion gap: 9 (ref 5–15)
BILIRUBIN TOTAL: 0.8 mg/dL (ref 0.3–1.2)
BUN: 21 mg/dL — ABNORMAL HIGH (ref 6–20)
CALCIUM: 9.6 mg/dL (ref 8.9–10.3)
CO2: 20 mmol/L — ABNORMAL LOW (ref 22–32)
CREATININE: 0.83 mg/dL (ref 0.44–1.00)
Chloride: 102 mmol/L (ref 101–111)
Glucose, Bld: 242 mg/dL — ABNORMAL HIGH (ref 65–99)
Potassium: 3.3 mmol/L — ABNORMAL LOW (ref 3.5–5.1)
Sodium: 131 mmol/L — ABNORMAL LOW (ref 135–145)
Total Protein: 7.1 g/dL (ref 6.5–8.1)

## 2014-12-01 LAB — RAPID URINE DRUG SCREEN, HOSP PERFORMED
Amphetamines: NOT DETECTED
BENZODIAZEPINES: NOT DETECTED
Barbiturates: NOT DETECTED
COCAINE: NOT DETECTED
Opiates: NOT DETECTED
Tetrahydrocannabinol: NOT DETECTED

## 2014-12-01 LAB — ETHANOL

## 2014-12-01 MED ORDER — ACETAMINOPHEN 325 MG PO TABS: 650.0000 mg | ORAL_TABLET | ORAL | Status: DC | PRN

## 2014-12-01 MED ORDER — NICOTINE 21 MG/24HR TD PT24: 21.0000 mg | MEDICATED_PATCH | Freq: Every day | TRANSDERMAL | Status: DC | PRN

## 2014-12-01 MED ORDER — ONDANSETRON HCL 4 MG PO TABS: 4.0000 mg | ORAL_TABLET | Freq: Three times a day (TID) | ORAL | Status: DC | PRN

## 2014-12-01 NOTE — BH Assessment (Signed)
Reviewed ED notes prior to initiating assessment. Per notes pt has been in recovery for 5 months and presents to ED with SI. Per EPIC pt has attended IOP for SA.   Assessment to commence shortly.   Clista Bernhardt, [facility] Triage Specialist 12/01/2014 10:36 PM

## 2014-12-01 NOTE — ED Notes (Signed)
Pt states that she tried to kill herself Friday, she took several trazodone, prozac and geodon, she states that she vomited about 4 hours later and then slept all of Saturday and Sunday. Pt says that she's been depressed

## 2014-12-01 NOTE — ED Provider Notes (Signed)
CSN: [id number]     Arrival date & time 12/01/14  1916 History  This chart was scribed for Elpidio Anis, PA-C, working with Nelva Nay, MD by Elon Spanner, ED Scribe. This patient was seen in room WTR4/WLPT4 and the patient's care was started at 9:30 PM.   Chief Complaint  Patient presents with  . Suicidal   The history is provided by the patient. No language interpreter was used.   HPI Comments: Tina [name] is a 44 y.o. female with a hx of depression and previous suicide attempt who presents voluntarily for evaluation of SI.  No HI, hallucinations.  She reports a hx of alcoholism but has been sober for 5 months.  No physical complaints.   Past Medical History  Diagnosis Date  . History of chicken pox   . Hypothyroidism   . Genital warts   . Depression   . Bipolar 2 disorder   . Adrenal insufficiency   . Acne   . Cellulitis   . Hx of Lyme disease   . Headache    Past Surgical History  Procedure Laterality Date  . Tonsillectomy    . Cesarean section  2003, 2007, 2011  . Umbilical hernia repair    . Inguinal hernia repair Bilateral    Family History  Problem Relation Age of Onset  . Heart disease Other     Grandparent  . Alcohol abuse Paternal Grandmother    Social History  Substance Use Topics  . Smoking status: Current Every Day Smoker -- 3.00 packs/day    Types: Cigarettes  . Smokeless tobacco: None  . Alcohol Use: 7.2 - 10.8 oz/week    10 Shots of liquor, 2-8 Cans of beer per week     Comment: social drinker-few drinks a week    OB History    No data available     Review of Systems  Psychiatric/Behavioral: Positive for suicidal ideas and dysphoric mood.  All other systems reviewed and are negative.     Allergies  Stadol; Vancomycin; Penicillins; and Sulfa antibiotics  Home Medications   Prior to Admission medications   Medication Sig Start Date End Date Taking? Authorizing Provider  buPROPion (WELLBUTRIN XL) 300 MG 24 hr tablet Take 300 mg by  mouth daily.  08/07/14  Yes Historical Provider, MD  cholestyramine Lanetta Inch) 4 G packet Take 4 g by mouth daily as needed (lymes disease).  04/30/14  Yes Historical Provider, MD  FLUoxetine (PROZAC) 20 MG capsule Take 20 mg by mouth daily.   Yes Historical Provider, MD  lamoTRIgine (LAMICTAL) 200 MG tablet Take 200 mg by mouth at bedtime.  04/14/14  Yes Historical Provider, MD  spironolactone (ALDACTONE) 100 MG tablet Take 100 mg by mouth daily.  08/23/14  Yes Historical Provider, MD  thyroid (ARMOUR) 120 MG tablet Take 1 tablet (120 mg total) by mouth daily before breakfast. For hypothyroidism 02/20/13  Yes Sanjuana Kava, NP  topiramate (TOPAMAX) 100 MG tablet Take 100 mg by mouth 2 (two) times daily. 04/30/14  Yes Historical Provider, MD  baclofen (LIORESAL) 20 MG tablet Take 1/2 tablet morning and afternoon  and 1 tablet at night Patient not taking: Reported on 12/01/2014 07/11/14   Court Joy, PA-C  diphenhydrAMINE (BENADRYL) 50 MG capsule Take 1 capsule (50 mg total) by mouth every 8 (eight) hours as needed. Patient not taking: Reported on 12/01/2014 06/30/14   Nelly Rout, MD  escitalopram (LEXAPRO) 20 MG tablet 2 tablets daily Patient not taking: Reported on 12/01/2014  07/11/14   Court Joy, PA-C  gabapentin (NEURONTIN) 400 MG capsule Take 1 capsule (400 mg total) by mouth 4 (four) times daily. Patient not taking: Reported on 12/01/2014 07/11/14 07/11/15  Court Joy, PA-C  propranolol (INDERAL) 10 MG tablet Tak 1 dose in am .May repeat if needed for anxiety Patient not taking: Reported on 12/01/2014 06/27/14   Court Joy, PA-C  traZODone (DESYREL) 100 MG tablet Take 100 mg by mouth at bedtime.  06/18/14   Historical Provider, MD  ziprasidone (GEODON) 80 MG capsule Take 240 mg by mouth at bedtime.  08/21/14   Historical Provider, MD   BP 98/72 mmHg  Pulse 94  Temp(Src) 98.5 F (36.9 C) (Oral)  Resp 18  SpO2 100%  LMP  (LMP Unknown) Physical Exam  Constitutional: She is oriented  to person, place, and time. She appears well-developed and well-nourished. No distress.  HENT:  Head: Normocephalic and atraumatic.  Eyes: Conjunctivae and EOM are normal.  Neck: Neck supple. No tracheal deviation present.  Cardiovascular: Normal rate.   Pulmonary/Chest: Effort normal. No respiratory distress.  Musculoskeletal: Normal range of motion.  Neurological: She is alert and oriented to person, place, and time.  Skin: Skin is warm and dry.  Psychiatric: Her speech is normal. She is withdrawn. She exhibits a depressed mood. She expresses suicidal ideation. She expresses no suicidal plans.  Nursing note and vitals reviewed.   ED Course  Procedures (including critical care time)  DIAGNOSTIC STUDIES: Oxygen Saturation is 100% on RA, normal by my interpretation.    COORDINATION OF CARE:    Labs Review Labs Reviewed  COMPREHENSIVE METABOLIC PANEL - Abnormal; Notable for the following:    Sodium 131 (*)    Potassium 3.3 (*)    CO2 20 (*)    Glucose, Bld 242 (*)    BUN 21 (*)    All other components within normal limits  CBC - Abnormal; Notable for the following:    MCHC 36.4 (*)    All other components within normal limits  ETHANOL  URINE RAPID DRUG SCREEN, HOSP PERFORMED   Results for orders placed or performed during the hospital encounter of 12/01/14  Comprehensive metabolic panel  Result Value Ref Range   Sodium 131 (L) 135 - 145 mmol/L   Potassium 3.3 (L) 3.5 - 5.1 mmol/L   Chloride 102 101 - 111 mmol/L   CO2 20 (L) 22 - 32 mmol/L   Glucose, Bld 242 (H) 65 - 99 mg/dL   BUN 21 (H) 6 - 20 mg/dL   Creatinine, Ser 0.98 0.44 - 1.00 mg/dL   Calcium 9.6 8.9 - 11.9 mg/dL   Total Protein 7.1 6.5 - 8.1 g/dL   Albumin 4.2 3.5 - 5.0 g/dL   AST 24 15 - 41 U/L   ALT 25 14 - 54 U/L   Alkaline Phosphatase 53 38 - 126 U/L   Total Bilirubin 0.8 0.3 - 1.2 mg/dL   GFR calc non Af Amer >60 >60 mL/min   GFR calc Af Amer >60 >60 mL/min   Anion gap 9 5 - 15  Ethanol (ETOH)   Result Value Ref Range   Alcohol, Ethyl (B) <5 <5 mg/dL  CBC  Result Value Ref Range   WBC 5.4 4.0 - 10.5 K/uL   RBC 4.49 3.87 - 5.11 MIL/uL   Hemoglobin 14.3 12.0 - 15.0 g/dL   HCT 14.7 82.9 - 56.2 %   MCV 87.5 78.0 - 100.0 fL   MCH 31.8  26.0 - 34.0 pg   MCHC 36.4 (H) 30.0 - 36.0 g/dL   RDW 16.1 09.6 - 04.5 %   Platelets 207 150 - 400 K/uL  Urine rapid drug screen (hosp performed) (Not at [facility])  Result Value Ref Range   Opiates NONE DETECTED NONE DETECTED   Cocaine NONE DETECTED NONE DETECTED   Benzodiazepines NONE DETECTED NONE DETECTED   Amphetamines NONE DETECTED NONE DETECTED   Tetrahydrocannabinol NONE DETECTED NONE DETECTED   Barbiturates NONE DETECTED NONE DETECTED    Imaging Review No results found. I have personally reviewed and evaluated these images and lab results as part of my medical decision-making.   EKG Interpretation None      MDM   Final diagnoses:  None    1. Suicidal ideation  Patient is here voluntarily requiring evaluation and treatment for suicidal ideation.   I personally performed the services described in this documentation, which was scribed in my presence. The recorded information has been reviewed and is accurate.     Elpidio Anis, PA-C 12/01/14 2146  Nelva Nay, MD 12/01/14 [phone]

## 2014-12-01 NOTE — ED Notes (Signed)
Patient denies HI and AVH at this time. Patient currently denies SI at this but cannot contract for safety at home. Patient oriented to unit. Plan of care discussed with patient. Patient voices no complaints or concerns at this time. Encouragement and support provided and safety maintain. Q 15 min safety checks in place.

## 2014-12-01 NOTE — BH Assessment (Addendum)
Tele Assessment Note   Tina [name] is an 44 y.o. female. Presenting to ED at the encouragement of a friend who learned pt attempted suicide on Friday. Pt reports she had been thinking about it for a week, she felt overwhelmed and unable to deal with every day life activities. She reports she took an overdose of 30 prozac, 60 geodon, and 15 trazadone, and then threw up. "Nothing happened." She is unable to contract for safety at this time time, stating she is not sure if she would attempt suicide again. Pt does not list any specific stressor. She has been in recovery from etoh and [facility] for 5 months, and reports recent medication changes. She is followed by [facility]  for medication management. At time of assessment she is alert and oriented times 4, with no evidence or reports of AVH, HI, or self harm. She reports hx of etoh and THC abuse, and at least two past suicide attempts via OD and cutting.   Pt reports she was dx with bipolar in her 64s and is primarily depressed, reporting her last manic episode was in 2014. She reports hopelessness, irritability, loss of pleasure, loss of motivation, decreased self care, missing work and suicide attempt.   Pt reports she worries about everything and feels unable to handle things, unable to cope with every day life activities. She denies hx of panic attacks. Denies hx of abuse or neglect. Reports she did not feels as overwhelmed when using. Also notes hx of thyroid problems that are not currently well managed.   Family hx is positive for depression (sister) negative for SA and SI concerns.   Axis I:  296.53 Bipolar I Disorder, most recent episode depressed, severe without psychotic features  300.00 Unspecified Anxiety Disorder, rule out GAD  304.30 Cannabis Use Disorder, moderate, in early full remission  303.90 Alcohol Use Disorder, severe, in early full remission  Past Medical History:  Past Medical History  Diagnosis Date  . History of chicken pox    . Hypothyroidism   . Genital warts   . Depression   . Bipolar 2 disorder   . Adrenal insufficiency   . Acne   . Cellulitis   . Hx of Lyme disease   . Headache     Past Surgical History  Procedure Laterality Date  . Tonsillectomy    . Cesarean section  2003, 2007, 2011  . Umbilical hernia repair    . Inguinal hernia repair Bilateral     Family History:  Family History  Problem Relation Age of Onset  . Heart disease Other     Grandparent  . Alcohol abuse Paternal Grandmother     Social History:  reports that she has been smoking Cigarettes.  She has been smoking about 3.00 packs per day. She does not have any smokeless tobacco history on file. She reports that she drinks about 7.2 - 10.8 oz of alcohol per week. She reports that she does not use illicit drugs.  Additional Social History:  Alcohol / Drug Use Pain Medications: See PTA Prescriptions: See PTA, reports compliance Over the Counter: See PTA History of alcohol / drug use?: Yes Longest period of sobriety (when/how long): 5 months, hx of seizures Negative Consequences of Use:  (none) Withdrawal Symptoms:  (none reported at this time) Substance #1 Name of Substance 1: etoh  1 - Age of First Use: 15 1 - Amount (size/oz): more than 7 shote per day 1 - Frequency: daily  1 - Duration:  years 1 - Last Use / Amount: 5 months ago Substance #2 Name of Substance 2: THC 2 - Age of First Use: 18 2 - Amount (size/oz): as much as she could get 2 - Frequency: daily, then "not very often before quiting" 2 - Duration: years 2 - Last Use / Amount: about 5 months ago   CIWA: CIWA-Ar BP: 98/72 mmHg Pulse Rate: 94 COWS:    PATIENT STRENGTHS: (choose at least two) Average or above average intelligence Communication skills Work skills  Allergies:  Allergies  Allergen Reactions  . Stadol [Butorphanol] Other (See Comments)    Dependence  . Vancomycin Itching  . Penicillins Hives and Rash  . Sulfa Antibiotics Rash     Home Medications:  (Not in a hospital admission)  OB/GYN Status:  No LMP recorded (lmp unknown).  General Assessment Data Location of Assessment: [facility] TTS Assessment: In system Is this a Tele or Face-to-Face Assessment?: Face-to-Face Is this an Initial Assessment or a Re-assessment for this encounter?: Initial Assessment Marital status: Divorced Is patient pregnant?: No Pregnancy Status: No Living Arrangements: Alone, Children (kids with her part of week, shared parenting ) Can pt return to current living arrangement?: Yes Admission Status: Voluntary Is patient capable of signing voluntary admission?: Yes Referral Source: Self/Family/Friend Insurance type: BCBS     Crisis Care Plan Living Arrangements: Alone, Children (kids with her part of week, shared parenting ) Name of Psychiatrist: Salome Holmes  Name of Therapist: none currently   Education Status Is patient currently in school?: No Current Grade: NA Highest grade of school patient has completed: 11 Name of school: NA Contact person: NA  Risk to self with the past 6 months Suicidal Ideation: Yes-Currently Present Has patient been a risk to self within the past 6 months prior to admission? : Yes Suicidal Intent: Yes-Currently Present Has patient had any suicidal intent within the past 6 months prior to admission? : Yes Is patient at risk for suicide?: Yes Suicidal Plan?: Yes-Currently Present Has patient had any suicidal plan within the past 6 months prior to admission? : Yes Specify Current Suicidal Plan: pt took an overdose on Friday of 30 prozac, 15 trazadone, and 60 geodon, and then threw up "nothing happened" Access to Means: Yes Specify Access to Suicidal Means: medications What has been your use of drugs/alcohol within the last 12 months?: Pt has been sober from etoh an THC for 5 months Previous Attempts/Gestures: Yes How many times?: 2 (possibly more, cut self, and overdose) Other Self Harm Risks:  none Triggers for Past Attempts: Unpredictable Intentional Self Injurious Behavior: None Family Suicide History: No Recent stressful life event(s): Other (Comment) (feels very overwhelmed) Persecutory voices/beliefs?: No Depression: Yes Depression Symptoms: Despondent, Tearfulness, Isolating, Fatigue, Guilt, Loss of interest in usual pleasures, Feeling worthless/self pity Substance abuse history and/or treatment for substance abuse?: Yes Suicide prevention information given to non-admitted patients: Yes  Risk to Others within the past 6 months Homicidal Ideation: No Does patient have any lifetime risk of violence toward others beyond the six months prior to admission? : No Thoughts of Harm to Others: No Current Homicidal Intent: No Current Homicidal Plan: No Access to Homicidal Means: No Identified Victim: none History of harm to others?: No Assessment of Violence: None Noted Violent Behavior Description: none Does patient have access to weapons?: No Criminal Charges Pending?: No Does patient have a court date: No Is patient on probation?: No  Psychosis Hallucinations: None noted Delusions: None noted  Mental Status Report Appearance/Hygiene:  Unremarkable Eye Contact: Good Motor Activity: Unremarkable Speech: Logical/coherent Level of Consciousness: Alert Mood: Depressed, Anxious Affect: Appropriate to circumstance Anxiety Level: Moderate Thought Processes: Coherent, Relevant Judgement: Partial Orientation: Person, Place, Time, Situation Obsessive Compulsive Thoughts/Behaviors: None  Cognitive Functioning Concentration: Normal Memory: Recent Intact, Remote Intact IQ: Average Insight: Fair Impulse Control: Fair Appetite: Poor Weight Loss: 40 Weight Gain: 0 Sleep: No Change Total Hours of Sleep: 9 Vegetative Symptoms: Decreased grooming  ADLScreening [facility] Assessment Services) Patient's cognitive ability adequate to safely complete daily activities?:  Yes Patient able to express need for assistance with ADLs?: Yes Independently performs ADLs?: Yes (appropriate for developmental age)  Prior Inpatient Therapy Prior Inpatient Therapy: Yes Prior Therapy Dates: Multiple Prior Therapy Facilty/Provider(s): [facility] Reason for Treatment: SA, bipolar  Prior Outpatient Therapy Prior Outpatient Therapy: Yes Prior Therapy Dates: ongoing with medication, IOP in past Prior Therapy Facilty/Provider(s): [facility], [facility] in the past Reason for Treatment: medication management, SA, bipolar Does patient have an ACCT team?: No Does patient have Intensive In-House Services?  : No Does patient have [facility] services? : No Does patient have P4CC services?: No  ADL Screening (condition at time of admission) Patient's cognitive ability adequate to safely complete daily activities?: Yes Is the patient deaf or have difficulty hearing?: No Does the patient have difficulty seeing, even when wearing glasses/contacts?: No Does the patient have difficulty concentrating, remembering, or making decisions?: No Patient able to express need for assistance with ADLs?: Yes Does the patient have difficulty dressing or bathing?: No Independently performs ADLs?: Yes (appropriate for developmental age) Does the patient have difficulty walking or climbing stairs?: No Weakness of Legs: None Weakness of Arms/Hands: None  Home Assistive Devices/Equipment Home Assistive Devices/Equipment: None    Abuse/Neglect Assessment (Assessment to be complete while patient is alone) Physical Abuse: Denies Verbal Abuse: Denies Sexual Abuse: Denies Exploitation of patient/patient's resources: Denies Self-Neglect: Denies Values / Beliefs Cultural Requests During Hospitalization: None Spiritual Requests During Hospitalization: None   Advance Directives (For Healthcare) Does patient have an advance directive?: No Would patient like information on creating an advanced directive?:  No - patient declined information    Additional Information 1:1 In Past 12 Months?: No CIRT Risk: No Elopement Risk: No Does patient have medical clearance?: Yes     Disposition:  Per Donell Sievert, PA pt meets inpt criteria and can be accepted to [facility] pending bed availability 300 or 400. Per [facility] pt can be placed in 402-1 under [place], to after 0030 on 12-02-14. Report to be called to [phone].  Informed pt, Rn, and Elpidio Anis PA.   Clista Bernhardt, [facility] Triage Specialist 12/01/2014 11:10 PM  Disposition Initial Assessment Completed for this Encounter: Yes  Kambryn Dapolito M 12/01/2014 11:07 PM

## 2014-12-02 ENCOUNTER — Inpatient Hospital Stay (HOSPITAL_COMMUNITY)
Admission: EM | Admit: 2014-12-02 | Discharge: 2014-12-22 | DRG: 885 | Disposition: A | Discharge status: Home / Self Care | Payer: BLUE CROSS/BLUE SHIELD | Source: Intra-hospital | Attending: Psychiatry | Admitting: Psychiatry

## 2014-12-02 ENCOUNTER — Encounter (HOSPITAL_COMMUNITY): Payer: Self-pay

## 2014-12-02 DIAGNOSIS — R45851 Suicidal ideations: Secondary | ICD-10-CM | POA: Diagnosis not present

## 2014-12-02 DIAGNOSIS — F3130 Bipolar disorder, current episode depressed, mild or moderate severity, unspecified: Secondary | ICD-10-CM | POA: Diagnosis not present

## 2014-12-02 DIAGNOSIS — F3175 Bipolar disorder, in partial remission, most recent episode depressed: Secondary | ICD-10-CM

## 2014-12-02 DIAGNOSIS — F319 Bipolar disorder, unspecified: Secondary | ICD-10-CM | POA: Diagnosis present

## 2014-12-02 DIAGNOSIS — I10 Essential (primary) hypertension: Secondary | ICD-10-CM | POA: Diagnosis present

## 2014-12-02 DIAGNOSIS — I951 Orthostatic hypotension: Secondary | ICD-10-CM

## 2014-12-02 DIAGNOSIS — E039 Hypothyroidism, unspecified: Secondary | ICD-10-CM | POA: Diagnosis present

## 2014-12-02 DIAGNOSIS — F3113 Bipolar disorder, current episode manic without psychotic features, severe: Secondary | ICD-10-CM | POA: Diagnosis not present

## 2014-12-02 DIAGNOSIS — F17210 Nicotine dependence, cigarettes, uncomplicated: Secondary | ICD-10-CM | POA: Diagnosis present

## 2014-12-02 DIAGNOSIS — F314 Bipolar disorder, current episode depressed, severe, without psychotic features: Secondary | ICD-10-CM | POA: Diagnosis not present

## 2014-12-02 DIAGNOSIS — R5383 Other fatigue: Secondary | ICD-10-CM | POA: Diagnosis not present

## 2014-12-02 LAB — TSH: TSH: 0.014 u[IU]/mL — AB (ref 0.350–4.500)

## 2014-12-02 MED ORDER — POTASSIUM CHLORIDE CRYS ER 10 MEQ PO TBCR
10.0000 meq | EXTENDED_RELEASE_TABLET | Freq: Every day | ORAL | Status: AC
  Administered 2014-12-02 – 2014-12-03 (×2): 10 meq via ORAL
  Filled 2014-12-02 (×4): qty 1

## 2014-12-02 MED ORDER — ACETAMINOPHEN 325 MG PO TABS: 650.0000 mg | ORAL_TABLET | Freq: Four times a day (QID) | ORAL | Status: DC | PRN

## 2014-12-02 MED ORDER — SPIRONOLACTONE 100 MG PO TABS
100.0000 mg | ORAL_TABLET | Freq: Every day | ORAL | Status: DC
  Administered 2014-12-02: 100 mg via ORAL
  Filled 2014-12-02: qty 1
  Filled 2014-12-02: qty 4
  Filled 2014-12-02 (×3): qty 1

## 2014-12-02 MED ORDER — MAGNESIUM HYDROXIDE 400 MG/5ML PO SUSP
30.0000 mL | Freq: Every day | ORAL | Status: DC | PRN
  Administered 2014-12-05 – 2014-12-09 (×2): 30 mL via ORAL
  Filled 2014-12-02 (×2): qty 30

## 2014-12-02 MED ORDER — THYROID 120 MG PO TABS
120.0000 mg | ORAL_TABLET | Freq: Every day | ORAL | Status: DC
Start: 2014-12-02 — End: 2014-12-22
  Administered 2014-12-02 – 2014-12-22 (×21): 120 mg via ORAL
  Filled 2014-12-02 (×11): qty 1
  Filled 2014-12-02: qty 4
  Filled 2014-12-02 (×12): qty 1

## 2014-12-02 MED ORDER — ENSURE ENLIVE PO LIQD
237.0000 mL | Freq: Two times a day (BID) | ORAL | Status: DC
  Administered 2014-12-02 – 2014-12-22 (×35): 237 mL via ORAL

## 2014-12-02 MED ORDER — HYDROXYZINE HCL 50 MG PO TABS
50.0000 mg | ORAL_TABLET | Freq: Once | ORAL | Status: AC
  Administered 2014-12-02: 50 mg via ORAL
  Filled 2014-12-02: qty 1

## 2014-12-02 MED ORDER — LAMOTRIGINE 200 MG PO TABS
200.0000 mg | ORAL_TABLET | Freq: Every day | ORAL | Status: DC
  Administered 2014-12-03 – 2014-12-21 (×19): 200 mg via ORAL
  Filled 2014-12-02 (×22): qty 1

## 2014-12-02 MED ORDER — ALUM & MAG HYDROXIDE-SIMETH 200-200-20 MG/5ML PO SUSP: 30.0000 mL | ORAL | Status: DC | PRN

## 2014-12-02 MED ORDER — BUPROPION HCL ER (XL) 300 MG PO TB24
300.0000 mg | ORAL_TABLET | Freq: Every day | ORAL | Status: DC
  Administered 2014-12-03: 300 mg via ORAL
  Filled 2014-12-02 (×4): qty 1

## 2014-12-02 MED ORDER — NICOTINE POLACRILEX 2 MG MT GUM
2.0000 mg | CHEWING_GUM | OROMUCOSAL | Status: DC | PRN
  Administered 2014-12-04: 2 mg via ORAL
  Filled 2014-12-02: qty 1

## 2014-12-02 MED ORDER — HYDROXYZINE HCL 50 MG PO TABS
ORAL_TABLET | ORAL | Status: AC
  Filled 2014-12-02: qty 1

## 2014-12-02 MED ORDER — TOPIRAMATE 100 MG PO TABS
100.0000 mg | ORAL_TABLET | Freq: Two times a day (BID) | ORAL | Status: DC
  Administered 2014-12-02 – 2014-12-14 (×26): 100 mg via ORAL
  Filled 2014-12-02 (×30): qty 1

## 2014-12-02 MED ORDER — QUETIAPINE FUMARATE 200 MG PO TABS
200.0000 mg | ORAL_TABLET | Freq: Every day | ORAL | Status: DC
  Filled 2014-12-02 (×3): qty 1

## 2014-12-02 NOTE — H&P (Signed)
Psychiatric Admission Assessment Adult  Patient Identification: Tina [name] MRN:  [id number] Date of Evaluation:  12/02/2014 Chief Complaint:  "I tried to kill myself on Friday last week."  Principal Diagnosis: Bipolar I disorder, current or most recent episode depressed, in partial remission Diagnosis:   Patient Active Problem List   Diagnosis Date Noted  . Bipolar affective disorder, current episode severe [F31.9] 12/02/2014  . Bipolar I disorder, current or most recent episode depressed, in partial remission [F31.75] 07/11/2014  . Bipolar 1 disorder, mixed [F31.60] 06/27/2014  . Disease of thyroid gland [E07.9] 06/27/2014  . Anxiety [F41.9] 06/27/2014  . 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 Bipolar 1 disorder, depressed, partial remission [F31.75] 06/27/2014  . 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 Alcohol use disorder, severe, dependence [F10.20] 06/24/2014  . Bipolar disorder, unspecified [F31.9] 02/16/2013  . Adrenal insufficiency [E27.40]   . Acne [L70.9]   . Cellulitis [L03.90]   . Hx of Lyme disease [Z86.19]   . Headache [R51]   . 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 Bipolar I disorder, most recent episode (or current) manic [F31.10] 08/29/2012  . Unspecified hypothyroidism [E03.9] 12/23/2010  . Myalgia [M79.1] 12/23/2010  . Fatigue [R53.83] 12/23/2010  . Unspecified vitamin deficiency [E56.9] 12/23/2010  . Depression [F32.9]    History of Present Illness::  Tina [name] is a 44 year old female who presented to the [facility] with encouragement from a friend who learned that patient had attempted suicide via overdose last week. The patient reported planning the attempt for at least one week. She reports she took an overdose of 30 prozac, 60 geodon, and 15 trazadone, and then threw up. The patient was unable to contract for safety and inpatient admission was recommended. Patient reports  she was dx with bipolar in her 74s and is primarily depressed, reporting her last manic episode was in 2014. She reports hopelessness, irritability, loss of pleasure, loss of motivation, decreased self care, missing work and suicide attempt. Patient appeared severely depressed with restricted affect notable during her psychiatric assessment today "I took a lot of medicines to end my life. But it did not work. I don't feel that anything will really help me. I have tried several medications before. I tried to kill myself in 2012 when I cut my wrist. I feel guilty about leaving my husband. I wanted to get back together but he has found someone else. I have three children who are with him right now. I have been clean from alcohol for five months. I have a sponsor. I sleep way too much. I have lost twenty pounds recently. My energy is low and I just do not enjoy life." Review of documentation in epic indicates that the patient finished the [facility] on 09/12/2014. Upon review of lab-work she is noted to have low potassium and sodium levels. The patient reports very poor appetite for several weeks.   Elements:  Location:  depressive symptoms. Quality:  Suicide attempt via overdose. Severity:  Severe. Timing:  Last few weeks. Duration:  Chronic. Context:  psychosocial stressors, chronic mental illness. Associated Signs/Symptoms: Depression Symptoms:  depressed mood, anhedonia, hypersomnia, psychomotor retardation, fatigue, feelings of worthlessness/guilt, difficulty concentrating, hopelessness, recurrent thoughts of death, suicidal attempt, anxiety, loss of energy/fatigue, decreased appetite, (Hypo) Manic Symptoms:  Denies currently but has a history of manic episodes  Anxiety Symptoms:  Excessive Worry, Psychotic Symptoms: Denies PTSD Symptoms: Negative Total Time spent with  patient: 1 hour  Past Medical History:  Past Medical History   Diagnosis Date  . History of chicken pox   . Hypothyroidism   . Genital warts   . Depression   . Bipolar 2 disorder   . Adrenal insufficiency   . Acne   . Cellulitis   . Hx of Lyme disease   . Headache     Past Surgical History  Procedure Laterality Date  . Tonsillectomy    . Cesarean section  2003, 2007, 2011  . Umbilical hernia repair    . Inguinal hernia repair Bilateral    Family History:  Family History  Problem Relation Age of Onset  . Heart disease Other     Grandparent  . Alcohol abuse Paternal Grandmother    Social History:  History  Alcohol Use  . 7.2 - 10.8 oz/week  . 2-8 Cans of beer, 10 Shots of liquor per week    Comment: social drinker-few drinks a week      History  Drug Use No    Social History   Social History  . Marital Status: Married    Spouse Name: N/A  . Number of Children: N/A  . Years of Education: 16   Occupational History  . HOMEMAKER    Social History Main Topics  . Smoking status: Current Every Day Smoker -- 1.50 packs/day for 29 years    Types: Cigarettes  . Smokeless tobacco: None  . Alcohol Use: 7.2 - 10.8 oz/week    2-8 Cans of beer, 10 Shots of liquor per week     Comment: social drinker-few drinks a week   . Drug Use: No  . Sexual Activity: Not Currently   Other Topics Concern  . None   Social History Narrative   Regular exercise-yes   Additional Social History:    History of alcohol / drug use?: Yes                     Musculoskeletal: Strength & Muscle Tone: within normal limits Gait & Station: normal Patient leans: N/A  Psychiatric Specialty Exam: Physical Exam  Constitutional:  Complete physical exam performed at [facility] and I concur with no noted exceptions.   Psychiatric: Her speech is normal. She is withdrawn. Cognition and memory are normal. She expresses impulsivity. She exhibits a depressed mood. She expresses suicidal ideation.    Review of Systems  Constitutional: Positive for  malaise/fatigue.  HENT: Negative.   Eyes: Negative.   Respiratory: Negative.   Cardiovascular: Negative.   Gastrointestinal: Negative.   Genitourinary: Negative.   Musculoskeletal: Negative.   Skin: Negative.   Neurological: Negative.   Endo/Heme/Allergies: Negative.   Psychiatric/Behavioral: Positive for depression and suicidal ideas.    Blood pressure 96/73, pulse 101, temperature 98.1 F (36.7 C), temperature source Oral, resp. rate 20, height 5" (0.127 m), weight 45.36 kg (100 lb), SpO2 100 %.Body mass index is 2,812.33 kg/(m^2).  General Appearance: Fairly Groomed  Patent attorney::  Good  Speech:  Normal Rate  Volume:  Normal  Mood:  Anxious and Depressed  Affect:  Constricted  Thought Process:  Linear  Orientation:  Full (Time, Place, and Person)  Thought Content:  Rumination  Suicidal Thoughts:  Yes.  without intent/plan  Homicidal Thoughts:  No  Memory:  Immediate;   Good Recent;   Good Remote;   Good  Judgement:  Fair  Insight:  Fair  Psychomotor Activity:  Decreased  Concentration:  Good  Recall:  Good  Fund of Knowledge:Good  Language: Good  Akathisia:  Negative  Handed:  Right  AIMS (if indicated):     Assets:  Communication Skills Desire for Improvement Resilience Vocational/Educational  ADL's:  Intact  Cognition: WNL  Sleep:      Risk to Self: Is patient at risk for suicide?: No What has been your use of drugs/alcohol within the last 12 months?: Hx of alcohol and THC abuse; sober for 5 months Risk to Others:   Prior Inpatient Therapy:   Prior Outpatient Therapy:    Alcohol Screening: Patient refused Alcohol Screening Tool: Yes 1. How often do you have a drink containing alcohol?: Never 9. Have you or someone else been injured as a result of your drinking?: No 10. Has a relative or friend or a doctor or another health worker been concerned about your drinking or suggested you cut down?: No Alcohol Use Disorder Identification Test Final Score (AUDIT):  0 Brief Intervention: Patient declined brief intervention  Allergies:   Allergies  Allergen Reactions  . Stadol [Butorphanol] Other (See Comments)    Dependence  . Vancomycin Itching  . Penicillins Hives and Rash  . Sulfa Antibiotics Rash   Lab Results:  Results for orders placed or performed during the hospital encounter of 12/02/14 (from the past 48 hour(s))  TSH     Status: Abnormal   Collection Time: 12/02/14  6:20 AM  Result Value Ref Range   TSH 0.014 (L) 0.350 - 4.500 uIU/mL    Comment: Performed at [facility]   Current Medications: Current Facility-Administered Medications  Medication Dose Route Frequency Provider Last Rate Last Dose  . acetaminophen (TYLENOL) tablet 650 mg  650 mg Oral Q6H PRN Kerry Hough, PA-C      . alum & mag hydroxide-simeth (MAALOX/MYLANTA) 200-200-20 MG/5ML suspension 30 mL  30 mL Oral Q4H PRN Kerry Hough, PA-C      . [START ON 12/03/2014] buPROPion (WELLBUTRIN XL) 24 hr tablet 300 mg  300 mg Oral Daily Rockey Situ Cobos, MD      . feeding supplement (ENSURE ENLIVE) (ENSURE ENLIVE) liquid 237 mL  237 mL Oral BID BM Craige Cotta, MD   237 mL at 12/02/14 1432  . lamoTRIgine (LAMICTAL) tablet 200 mg  200 mg Oral QHS Spencer E Simon, PA-C      . magnesium hydroxide (MILK OF MAGNESIA) suspension 30 mL  30 mL Oral Daily PRN Kerry Hough, PA-C      . nicotine polacrilex (NICORETTE) gum 2 mg  2 mg Oral PRN Rockey Situ Cobos, MD      . potassium chloride (K-DUR,KLOR-CON) CR tablet 10 mEq  10 mEq Oral Daily Craige Cotta, MD   10 mEq at 12/02/14 1704  . QUEtiapine (SEROQUEL) tablet 200 mg  200 mg Oral QHS Rockey Situ Cobos, MD      . spironolactone (ALDACTONE) tablet 100 mg  100 mg Oral Daily Kerry Hough, PA-C   100 mg at 12/02/14 0849  . thyroid (ARMOUR) tablet 120 mg  120 mg Oral QAC breakfast Kerry Hough, PA-C   120 mg at 12/02/14 0454  . topiramate (TOPAMAX) tablet 100 mg  100 mg Oral BID Kerry Hough, PA-C   100  mg at 12/02/14 1704   PTA Medications: Prescriptions prior to admission  Medication Sig Dispense Refill Last Dose  . baclofen (LIORESAL) 20 MG tablet Take 1/2 tablet morning and afternoon  and 1 tablet at night (Patient not taking: Reported  on 12/01/2014) 60 each 2 Completed Course at Unknown time  . buPROPion (WELLBUTRIN XL) 300 MG 24 hr tablet Take 300 mg by mouth daily.   1 Past Week at Unknown time  . cholestyramine (QUESTRAN) 4 G packet Take 4 g by mouth daily as needed (lymes disease).   4 11/30/2014 at Unknown time  . diphenhydrAMINE (BENADRYL) 50 MG capsule Take 1 capsule (50 mg total) by mouth every 8 (eight) hours as needed. (Patient not taking: Reported on 12/01/2014) 30 capsule 0 Completed Course at Unknown time  . escitalopram (LEXAPRO) 20 MG tablet 2 tablets daily (Patient not taking: Reported on 12/01/2014) 60 tablet 2 Completed Course at Unknown time  . FLUoxetine (PROZAC) 20 MG capsule Take 20 mg by mouth daily.   11/28/2014 at unknown time  . gabapentin (NEURONTIN) 400 MG capsule Take 1 capsule (400 mg total) by mouth 4 (four) times daily. (Patient not taking: Reported on 12/01/2014) 120 capsule 2 Completed Course at Unknown time  . lamoTRIgine (LAMICTAL) 200 MG tablet Take 200 mg by mouth at bedtime.   6 Past Week at Unknown time  . propranolol (INDERAL) 10 MG tablet Tak 1 dose in am .May repeat if needed for anxiety (Patient not taking: Reported on 12/01/2014) 60 tablet 2 Completed Course at Unknown time  . spironolactone (ALDACTONE) 100 MG tablet Take 100 mg by mouth daily.   3 Past Week at Unknown time  . thyroid (ARMOUR) 120 MG tablet Take 1 tablet (120 mg total) by mouth daily before breakfast. For hypothyroidism   11/30/2014 at Unknown time  . topiramate (TOPAMAX) 100 MG tablet Take 100 mg by mouth 2 (two) times daily.  11 Past Week at Unknown time  . traZODone (DESYREL) 100 MG tablet Take 100 mg by mouth at bedtime.   1 11/28/2014 at unknown time  . ziprasidone (GEODON) 80 MG  capsule Take 240 mg by mouth at bedtime.   0 11/28/2014 at unknown time    Previous Psychotropic Medications: Yes  Zyprexa, Seroquel, Lithium, Remeron Substance Abuse History in the last 12 months:  Yes.   Reports being sober from alcohol and marijuana for the past five months  Consequences of Substance Abuse: Negative  Results for orders placed or performed during the hospital encounter of 12/02/14 (from the past 72 hour(s))  TSH     Status: Abnormal   Collection Time: 12/02/14  6:20 AM  Result Value Ref Range   TSH 0.014 (L) 0.350 - 4.500 uIU/mL    Comment: Performed at [facility]    Observation Level/Precautions:  15 minute checks  Laboratory:  CBC Chemistry Profile UDS TSH  Psychotherapy:  Individual and Group Therapy  Medications:  Start Seroquel 200 mg hs for improved mood stability, Will continue Lamictal and Wellbutrin  Consultations:  As needed  Discharge Concerns:  Safety and Stability   Estimated LOS: 2-5 days  Other:  Increase collateral information, Order lab-work A1c, lipid panel, Free T4 EKG, Replace low potasium with K-DUR   Psychological Evaluations: Yes   Treatment Plan Summary: Daily contact with patient to assess and evaluate symptoms and progress in treatment and Medication management  Medical Decision Making:  Review of Psycho-Social Stressors (1), Review or order clinical lab tests (1), Established Problem, Worsening (2), Review of Medication Regimen & Side Effects (2) and Review of New Medication or Change in Dosage (2)  I certify that inpatient services furnished can reasonably be expected to improve the patient's condition.   Fransisca Kaufmann, NP-C  9/20/20166:47 PM  Patient case reviewed with NP and patient seen by me Agree with NP assessment, plan 44 year old divorced female, employed .  Reports worsening depression and. describes significant neuro-vegetative symptoms- to include low energy level, poor sense of self esteem,  anhedonia, poor appetite, poor sleep, weight loss. Recently attempted suicide by overdosing on large quantity of prescribed psychiatric medications, including Prozac and Geodon. States this was 3-4 days ago. States " I threw them all up and nothing happened ". States she told a friend and was brought to hospital. Reports history of Mood Disorder, has been diagnosed with Bipolar Disorder, and has had prior psychiatric admissions , most recently 2014.  States she has been on a significant number of psychiatric medications , to include Prozac ( x 2 months with no appreciable improvement , Geodon, Wellbutrin, Armour Thyroid for history of hypothyroidism, Lamictal, Topamax ( for history of migraines ) . Reports history of alcohol / cannabis abuse, But sober x 5-6 months .  Dx- Bipolar Disorder- Depressed . Plan- Agrees to continue Lamictal, Wellbutrin ( which she states may be the most effective medication she has been on) . Agrees to Seroquel trial ( as mood stabilizer and may help sleep, appetite, and help with significant anxiety she is reporting )

## 2014-12-02 NOTE — Progress Notes (Signed)
NUTRITION ASSESSMENT  Pt identified as at risk on the Malnutrition Screen Tool  INTERVENTION: 1. Educated patient on the importance of nutrition and encouraged intake of food and beverages. 2. Discussed weight goals. 3. Supplements: continue Ensure BID  NUTRITION DIAGNOSIS: Unintentional weight loss related to sub-optimal intake as evidenced by pt report.   Goal: Pt to meet >/= 90% of their estimated nutrition needs.  Monitor:  PO intake  Assessment:  Pt seen for MST. She was admitted for attempted suicide. She reports that she has recently lost 20 lbs but unable to give further specifics. This would indicate 17% body weight loss in unknown time frame.   Ensure Enlive has been ordered BID.  44 y.o. female  Height: Ht Readings from Last 1 Encounters:  12/02/14 5" (0.127 m)    Weight: Wt Readings from Last 1 Encounters:  12/02/14 100 lb (45.36 kg)    Weight Hx: Wt Readings from Last 10 Encounters:  12/02/14 100 lb (45.36 kg)  02/14/13 101 lb (45.813 kg)  08/25/12 110 lb (49.896 kg)  12/23/10 147 lb (66.679 kg)    BMI:  19.64 kg/m2 Pt meets criteria for normal weight based on current BMI.  Estimated Nutritional Needs: Kcal: 25-30 kcal/kg Protein: > 1 gram protein/kg Fluid: 1 ml/kcal  Diet Order: Diet regular Room service appropriate?: Yes; Fluid consistency:: Thin Pt is also offered choice of unit snacks mid-morning and mid-afternoon.  Pt is eating as desired.   Lab results and medications reviewed.      Trenton Gammon, RD, LDN Inpatient Clinical Dietitian Pager # [phone] After hours/weekend pager # [phone]
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
## 2014-12-02 NOTE — Progress Notes (Signed)
Patient ID: Tina [name], female   DOB: 17-Aug-1970, 44 y.o.   MRN: [id number]  Patient alert and oriented x 4. Patient came to facility from [facility]. Patient attempted suicide by taking medications Friday Sept. 16, 2016, after having an increase of suicidal thoughts. Patient denies pain at the time of assessment. Patient reports she is having passive SI without a pain, no urges to harm self or others at this time. Patient denies AVH. Patient was able to contract for safety with this Clinical research associate.  Patient reports she has lost about 20 lbs in recent months. Order placed for ensure BID between meals.  Patient weight at admission was 100 lbs with a height 5". Patient refused flu and pneumonia vaccine at time of admission. Patient states, "I have been feeling more depressed here lately." Patient speech and movements slow. Patient reports she is a everyday smoker of 1.5 packs since the age of 44yo. Patient preferred Nicotine Gum, orders placed.  Patient states she has three children that live with her partly. Patient skin is clean dry and intact. Patient has a tattoo of a rose on her left wrist.  Patient was oriented to the unit and shown room. Patient requested something to help her sleep. Orders places by PA, Vistaril 50 mg PO was given. Will continue to monitor.

## 2014-12-02 NOTE — Discharge Instructions (Signed)
TRANSPORT TO [facility]

## 2014-12-02 NOTE — ED Notes (Signed)
Pt transported to [facility] by [facility] transportation service for continuation of specialized care. Belongings given to driver after patient signed for them. Pt left in no acute distress. 

## 2014-12-02 NOTE — [facility] Group Notes (Signed)
[facility] LCSW Group Therapy 12/02/2014 1:15 PM  Type of Therapy: Group Therapy- Feelings about Diagnosis  Participation Level: Minimal  Participation Quality:  Withdrawn  Affect:  Flat  Cognitive: Alert and Oriented   Insight:  Developing   Engagement in Therapy: Limited  Modes of Intervention: Clarification, Confrontation, Discussion, Education, Exploration, Limit-setting, Orientation, Problem-solving, Rapport Building, Dance movement psychotherapist, Socialization and Support  Description of Group:   This group will allow patients to explore their thoughts and feelings about diagnoses they have received. Patients will be guided to explore their level of understanding and acceptance of these diagnoses. Facilitator will encourage patients to process their thoughts and feelings about the reactions of others to their diagnosis, and will guide patients in identifying ways to discuss their diagnosis with significant others in their lives. This group will be process-oriented, with patients participating in exploration of their own experiences as well as giving and receiving support and challenge from other group members.  Summary of Progress/Problems:  Pt was withdrawn in group discussion, required prompting to participate. She often stated, "I don't know" in response to questions. She did verbalize that she feels that her mental illness defines her life. She was able to identify being a good mother as something that she likes about herself.  Therapeutic Modalities:   Cognitive Behavioral Therapy Solution Focused Therapy Motivational Interviewing Relapse Prevention Therapy  Chad Cordial, LCSWA 12/02/2014 4:04 PM

## 2014-12-02 NOTE — Progress Notes (Signed)
DAR Note: Patient appears sad, tired and depressed.  Reports poor night sleep.  Denies pain, auditory and visual hallucinations.  Medications given as prescribed.  Maintained on routine safety checks per protocol.  Offered no complaints.  Support and encouragement offered as needed.

## 2014-12-02 NOTE — Tx Team (Signed)
Initial Interdisciplinary Treatment Plan   PATIENT STRESSORS: Health problems Medication change or noncompliance   PATIENT STRENGTHS: Ability for insight Capable of independent living Communication skills General fund of knowledge Supportive family/friends   PROBLEM LIST: Problem List/Patient Goals Date to be addressed Date deferred Reason deferred Estimated date of resolution  "I am depressed" 12/02/2014     "I am having increase in suicidal thought." 12/02/2014                                                DISCHARGE CRITERIA:  Improved stabilization in mood, thinking, and/or behavior Motivation to continue treatment in a less acute level of care Reduction of life-threatening or endangering symptoms to within safe limits  PRELIMINARY DISCHARGE PLAN: Outpatient therapy Return to previous living arrangement  PATIENT/FAMIILY INVOLVEMENT: This treatment plan has been presented to and reviewed with the patient, KAYLANI FROMME.  The patient have been given the opportunity to ask questions and make suggestions.  Wandra Mannan 12/02/2014, 2:34 AM

## 2014-12-02 NOTE — Progress Notes (Signed)
D: Patient in her room in bed on approach.  Patient states she is ok but just tired.  Patient spoke with Clinical research associate with her eyes closed the entire conversation.  Patient would not get out of bed for medications or to get her EKG after being encouraged several time.  Patient states her goal is to get some rest because she was tired.  Patient denies SI/HI and denies AVH.  Patient did not come to get EKG done after being asked several times to come up due to her roommate being in the room.   A: Staff to monitor Q 15 mins for safety.  Encouragement and support offered. No scheduled medications administered per orders. R: Patient remains safe on the unit.  Patient did not attend group tonight.  Patient not visible on the unit tonight.  Scheduled medications not administered tonight.

## 2014-12-02 NOTE — [facility] Suicide Risk Assessment (Signed)
[facility] Admission Suicide Risk Assessment   Nursing information obtained from:   chart and patient Demographic factors:   44 year old divorced female, three children, employed  Current Mental Status:   see below  Loss Factors:   divorce, chronic mental illness, financial stressors  Historical Factors:   reports history of bipolarity  Risk Reduction Factors:   sense of responsibility to children  Total Time spent with patient: 45 minutes Principal Problem: Bipolar Disorder, current episode Depressed  Diagnosis:   Patient Active Problem List   Diagnosis Date Noted  . Bipolar affective disorder, current episode severe [F31.9] 12/02/2014  . Bipolar I disorder, current or most recent episode depressed, in partial remission [F31.75] 07/11/2014  . Bipolar 1 disorder, mixed [F31.60] 06/27/2014  . Disease of thyroid gland [E07.9] 06/27/2014  . Anxiety [F41.9] 06/27/2014  . 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 Bipolar 1 disorder, depressed, partial remission [F31.75] 06/27/2014  . 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 Alcohol use disorder, severe, dependence [F10.20] 06/24/2014  . Bipolar disorder, unspecified [F31.9] 02/16/2013  . Adrenal insufficiency [E27.40]   . Acne [L70.9]   . Cellulitis [L03.90]   . Hx of Lyme disease [Z86.19]   . Headache [R51]   . 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 Bipolar I disorder, most recent episode (or current) manic [F31.10] 08/29/2012  . Unspecified hypothyroidism [E03.9] 12/23/2010  . Myalgia [M79.1] 12/23/2010  . Fatigue [R53.83] 12/23/2010  . Unspecified vitamin deficiency [E56.9] 12/23/2010  . Depression [F32.9]      Continued Clinical Symptoms:  Alcohol Use Disorder Identification Test Final Score (AUDIT): 0 The "Alcohol Use Disorders Identification Test", Guidelines for Use in Primary Care, Second Edition.  World Science writer [facility]). Score between 0-7:   no or low risk or alcohol related problems. Score between 8-15:  moderate risk of alcohol related problems. Score between 16-19:  high risk of alcohol related problems. Score 20 or above:  warrants further diagnostic evaluation for alcohol dependence and treatment.   CLINICAL FACTORS:  44 year old divorced female, employed .  Reports worsening depression and.  describes significant neuro-vegetative symptoms- to include low energy level, poor sense of self esteem, anhedonia, poor appetite, poor sleep, weight loss. Recently attempted suicide by overdosing on large quantity of prescribed psychiatric medications, including  Prozac and Geodon. States this was 3-4 days ago. States " I threw them all up and nothing happened ". States she told a friend and was brought to hospital. Reports history of Mood Disorder, has been diagnosed with Bipolar Disorder, and has had prior psychiatric admissions , most recently 2014.  States she has been on a significant number of psychiatric medications , to include Prozac ( x 2 months with no appreciable improvement , Geodon, Wellbutrin, Armour Thyroid for history of hypothyroidism, Lamictal, Topamax ( for history of migraines ) . Reports history of alcohol / cannabis abuse,  But sober x 5-6 months .   Dx- Bipolar Disorder- Depressed . Plan- Agrees to continue Lamictal, Wellbutrin ( which she states may be the most effective medication she has been on) . Agrees to Seroquel trial ( as mood stabilizer and may help sleep, appetite, and help with significant anxiety she is reporting )   Musculoskeletal: Strength & Muscle Tone: within normal limits Gait & Station: normal Patient leans: N/A  Psychiatric Specialty Exam: Physical Exam  ROS denies chest pain, denies SOB, denies vomiting, (+) history of headaches   Blood pressure 96/73,  pulse 101, temperature 98.1 F (36.7 C), temperature source Oral, resp. rate 20, height 5" (0.127 m), weight 100 lb (45.36 kg), SpO2 100  %.Body mass index is 2,812.33 kg/(m^2).  General Appearance: Fairly Groomed  Patent attorney::  Good  Speech:  Normal Rate  Volume:  Normal  Mood:  Anxious and Depressed  Affect:  Constricted  Thought Process:  Linear  Orientation:  Full (Time, Place, and Person)  Thought Content:  denies hallucinations, no delusions, not internally preoccupied   Suicidal Thoughts:  Yes.  without intent/plan- at this time denies any plan or intention of hurting self or of SI and contracts for safety on the unit  Homicidal Thoughts:  No  Memory:  recent and remote grossly intact   Judgement:  Fair  Insight:  Fair  Psychomotor Activity:  Decreased  Concentration:  Good  Recall:  Good  Fund of Knowledge:Good  Language: Good  Akathisia:  Negative  Handed:  Right  AIMS (if indicated):     Assets:  Communication Skills Desire for Improvement Resilience Vocational/Educational  Sleep:     Cognition: WNL  ADL's:   Fair      COGNITIVE FEATURES THAT CONTRIBUTE TO RISK:  Closed-mindedness and Loss of executive function    SUICIDE RISK:   Moderate:  Frequent suicidal ideation with limited intensity, and duration, some specificity in terms of plans, no associated intent, good self-control, limited dysphoria/symptomatology, some risk factors present, and identifiable protective factors, including available and accessible social support.  PLAN OF CARE: Patient will be admitted to inpatient psychiatric unit for stabilization and safety. Will provide and encourage milieu participation. Provide medication management and maked adjustments as needed.  Will follow daily.    Medical Decision Making:  New problem, with additional work up planned, Review of Psycho-Social Stressors (1), Established Problem, Worsening (2) and Review of Medication Regimen & Side Effects (2)  I certify that inpatient services furnished can reasonably be expected to improve the patient's condition.   COBOS, FERNANDO 12/02/2014, 6:03  PM

## 2014-12-02 NOTE — Progress Notes (Signed)
Adult Psychoeducational Group Note  Date:  12/02/2014 Time:  9:21 PM  Group Topic/Focus:  Wrap-Up Group:   The focus of this group is to help patients review their daily goal of treatment and discuss progress on daily workbooks.  Participation Level:  Did Not Attend  Participation Quality:  Did not attend  Affect:  Did not attend  Cognitive:  Did not attend  Insight: None  Engagement in Group:  Did not attend  Modes of Intervention:  Did not attend  Additional Comments:  Patient did not attend wrap up group tonight.   Taffney L Deas 12/02/2014, 9:21 PM

## 2014-12-02 NOTE — Tx Team (Signed)
Interdisciplinary Treatment Plan Update (Adult) Date: 12/02/2014   Date: 12/02/2014 9:51 AM  Progress in Treatment:  Attending groups: Pt is new to milieu, continuing to assess  Participating in groups: Pt is new to milieu, continuing to assess  Taking medication as prescribed: Yes  Tolerating medication: Yes  Family/Significant othe contact made: No, CSW assessing for appropriate contact Patient understands diagnosis: Continuing to assess Discussing patient identified problems/goals with staff: Yes  Medical problems stabilized or resolved: Yes  Denies suicidal/homicidal ideation: Endorses passive SI Patient has not harmed self or Others: Yes   New problem(s) identified: None identified at this time.   Discharge Plan or Barriers: Pt will return home and follow-up with Letta Moynahan  Additional comments: n/a   Reason for Continuation of Hospitalization:  Depression Anxiety  Medication stabilization Suicidal ideation  Estimated length of stay: 3-5 days  Review of initial/current patient goals per problem list:   1.  Goal(s): Patient will participate in aftercare plan  Met:  Yes  Target date: 3-5 days from date of admission   As evidenced by: Patient will participate within aftercare plan AEB aftercare provider and housing plan at discharge being identified.   12/02/14: pt will return home and follow-up with Letta Moynahan.  2.  Goal (s): Patient will exhibit decreased depressive symptoms and suicidal ideations.  Met:  No  Target date: 3-5 days from date of admission   As evidenced by: Patient will utilize self rating of depression at 3 or below and demonstrate decreased signs of depression or be deemed stable for discharge by MD. 12/02/14: Pt was admitted with symptoms of depression, rating 10/10. Pt continues to present with flat affect and depressive symptoms.  Pt will demonstrate decreased symptoms of depression and rate depression at 3/10 or lower prior to  discharge.  3.  Goal(s): Patient will demonstrate decreased signs and symptoms of anxiety.  Met:  No  Target date: 3-5 days from date of admission   As evidenced by: Patient will utilize self rating of anxiety at 3 or below and demonstrated decreased signs of anxiety, or be deemed stable for discharge by MD 12/02/14: Pt was admitted with increased levels of anxiety and is currently rating those symptoms highly. Pt will demonstrated decreased symptoms of anxiety and rate it at 3/10 prior to d/c.  Attendees:  Patient:    Family:    Physician: Dr. Parke Poisson, MD  12/02/2014 9:51 AM  Nursing: Lars Pinks, RN Case manager  12/02/2014 9:51 AM  Clinical Social Worker Norman Clay, MSW 12/02/2014 9:51 AM  Other: Lucinda Dell, Beverly Sessions Liasion 12/02/2014 9:51 AM  Clinical:  Hedy Jacob, RN; Gaylan Gerold 12/02/2014 9:51 AM  Other: , RN Charge Nurse 12/02/2014 9:51 AM  Other:     Peri Maris, Latanya Presser MSW

## 2014-12-03 LAB — HEMOGLOBIN A1C
Hgb A1c MFr Bld: 5.1 % (ref 4.8–5.6)
MEAN PLASMA GLUCOSE: 100 mg/dL

## 2014-12-03 LAB — LIPID PANEL
CHOL/HDL RATIO: 4.6 ratio
CHOLESTEROL: 195 mg/dL (ref 0–200)
HDL: 42 mg/dL (ref >40)
LDL Cholesterol: 129 mg/dL — ABNORMAL HIGH (ref 0–99)
TRIGLYCERIDES: 122 mg/dL (ref <150)
VLDL: 24 mg/dL (ref 0–40)

## 2014-12-03 LAB — T4, FREE: Free T4: 0.87 ng/dL (ref 0.61–1.12)

## 2014-12-03 MED ORDER — TRAZODONE HCL 50 MG PO TABS
50.0000 mg | ORAL_TABLET | Freq: Every evening | ORAL | Status: DC | PRN
  Administered 2014-12-03: 50 mg via ORAL
  Filled 2014-12-03 (×3): qty 1

## 2014-12-03 MED ORDER — VENLAFAXINE HCL ER 75 MG PO CP24
75.0000 mg | ORAL_CAPSULE | Freq: Every day | ORAL | Status: DC
  Administered 2014-12-04 – 2014-12-08 (×5): 75 mg via ORAL
  Filled 2014-12-03 (×7): qty 1

## 2014-12-03 MED ORDER — BUPROPION HCL ER (XL) 300 MG PO TB24: 450.0000 mg | ORAL_TABLET | Freq: Every day | ORAL | Status: DC

## 2014-12-03 MED ORDER — CITALOPRAM HYDROBROMIDE 10 MG PO TABS: 10.0000 mg | ORAL_TABLET | Freq: Every day | ORAL | Status: DC

## 2014-12-03 MED ORDER — LURASIDONE HCL 40 MG PO TABS
40.0000 mg | ORAL_TABLET | Freq: Every day | ORAL | Status: DC
  Administered 2014-12-04 – 2014-12-09 (×6): 40 mg via ORAL
  Filled 2014-12-03 (×8): qty 1

## 2014-12-03 NOTE — Progress Notes (Signed)
[facility] MD Progress Note  12/03/2014 6:15 PM Tina [name]  MRN:  [id number] Subjective:   Patient reports some ongoing depression , ruminations. She  Is focused on medications/ potential side effects . She states she has researched medications and is reluctant to try Seroquel due to weight risk. For similar reasons she is reluctant to try Remeron as augmentation to antidepressant . She states she is interested in trying Effexor XR , which she has not been on before, rather than continuing Wellbutrin , which she states has not been working particularly well for her recently. Objective: I have discussed case with treatment team and have met with patient. As per staff , she has reported depression, low energy, sadness, and presents with a constricted affect . She remains depressed, sad, and tearful at times, particularly when discussing stressors relating to marital separation/divorce . She ruminates about medication side effects/ medication options as above . States she tends to gain weight easily from medications and is therefore reluctant to take medication often associated with weight gain. As noted, interested in Effexor XR trial. Of note, patient has been on aldactone for several weeks to months- she states it was started by GP " to help my hormones", denies having history of HTN or edema. States she wants to stop it . On unit, behavior has been calm, appropriate, has been visible on unit, and has been going to some groups. Today denies any active SI and is able to contract for safety on unit.  TSH low - patient has history of hypothyroidism and is on Armour thyroid, T4 level WNL. Lipid panel unremarkable except for mildly elevated LDL, HgbA1C 5.1 Principal Problem: Bipolar I disorder, current or most recent episode depressed, in partial remission Diagnosis:   Patient Active Problem List   Diagnosis Date Noted  . Bipolar affective disorder, current episode severe [F31.9] 12/02/2014  . Bipolar I  disorder, current or most recent episode depressed, in partial remission [F31.75] 07/11/2014  . Bipolar 1 disorder, mixed [F31.60] 06/27/2014  . Disease of thyroid gland [E07.9] 06/27/2014  . Anxiety [F41.9] 06/27/2014  . 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 Bipolar 1 disorder, depressed, partial remission [F31.75] 06/27/2014  . 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 Alcohol use disorder, severe, dependence [F10.20] 06/24/2014  . Bipolar disorder, unspecified [F31.9] 02/16/2013  . Adrenal insufficiency [E27.40]   . Acne [L70.9]   . Cellulitis [L03.90]   . Hx of Lyme disease [Z86.19]   . Headache [R51]   . 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 Bipolar I disorder, most recent episode (or current) manic [F31.10] 08/29/2012  . Unspecified hypothyroidism [E03.9] 12/23/2010  . Myalgia [M79.1] 12/23/2010  . Fatigue [R53.83] 12/23/2010  . Unspecified vitamin deficiency [E56.9] 12/23/2010  . Depression [F32.9]    Total Time spent with patient: 25 minutes    Past Medical History:  Past Medical History  Diagnosis Date  . History of chicken pox   . Hypothyroidism   . Genital warts   . Depression   . Bipolar 2 disorder   . Adrenal insufficiency   . Acne   . Cellulitis   . Hx of Lyme disease   . Headache     Past Surgical History  Procedure Laterality Date  . Tonsillectomy    . Cesarean section  2003, 2007, 2011  . Umbilical hernia repair    . Inguinal hernia repair Bilateral    Family History:  Family History  Problem  Relation Age of Onset  . Heart disease Other     Grandparent  . Alcohol abuse Paternal Grandmother    Social History:  History  Alcohol Use  . 7.2 - 10.8 oz/week  . 2-8 Cans of beer, 10 Shots of liquor per week    Comment: social drinker-few drinks a week      History  Drug Use No    Social History   Social History  . Marital Status: Married    Spouse Name: N/A   . Number of Children: N/A  . Years of Education: 16   Occupational History  . HOMEMAKER    Social History Main Topics  . Smoking status: Current Every Day Smoker -- 1.50 packs/day for 29 years    Types: Cigarettes  . Smokeless tobacco: None  . Alcohol Use: 7.2 - 10.8 oz/week    2-8 Cans of beer, 10 Shots of liquor per week     Comment: social drinker-few drinks a week   . Drug Use: No  . Sexual Activity: Not Currently   Other Topics Concern  . None   Social History Narrative   Regular exercise-yes   Additional History:    Sleep: Fair  Appetite:  Fair   Assessment:   Musculoskeletal: Strength & Muscle Tone: within normal limits Gait & Station: normal Patient leans: N/A   Psychiatric Specialty Exam: Physical Exam  ROS no chest pain, no shortness of breath, no vomiting  Blood pressure 116/63, pulse 115, temperature 97.8 F (36.6 C), temperature source Oral, resp. rate 18, height 5" (0.127 m), weight 100 lb (45.36 kg), SpO2 100 %.Body mass index is 2,812.33 kg/(m^2).  General Appearance: Fairly Groomed  Engineer, water::  Good  Speech:  Normal Rate  Volume:  Normal  Mood:  Depressed  Affect:  Constricted- a little more reactive today, does smile briefly at times   Thought Process:  Goal Directed and Linear  Orientation:  Full (Time, Place, and Person)  Thought Content:  denies hallucinations, no delusions , not internally preoccupied   Suicidal Thoughts:  No currently able to contract for safety on the unit, denies thoughts of hurting self or of SI .   Homicidal Thoughts:  No  Memory:  recent and remote grossly intact   Judgement:  Fair  Insight:  Present  Psychomotor Activity:  Normal  Concentration:  Good  Recall:  Good  Fund of Knowledge:Good  Language: Good  Akathisia:  Negative  Handed:  Right  AIMS (if indicated):     Assets:  Desire for Improvement Housing Resilience Vocational/Educational  ADL's:   Improved   Cognition: WNL  Sleep:         Current Medications: Current Facility-Administered Medications  Medication Dose Route Frequency Provider Last Rate Last Dose  . acetaminophen (TYLENOL) tablet 650 mg  650 mg Oral Q6H PRN Laverle Hobby, PA-C      . alum & mag hydroxide-simeth (MAALOX/MYLANTA) 200-200-20 MG/5ML suspension 30 mL  30 mL Oral Q4H PRN Laverle Hobby, PA-C      . feeding supplement (ENSURE ENLIVE) (ENSURE ENLIVE) liquid 237 mL  237 mL Oral BID BM Myer Peer Peni Rupard, MD   237 mL at 12/03/14 1549  . lamoTRIgine (LAMICTAL) tablet 200 mg  200 mg Oral QHS Maurine Minister Simon, PA-C   200 mg at 12/02/14 2200  . [START ON 12/04/2014] lurasidone (LATUDA) tablet 40 mg  40 mg Oral Q breakfast Myer Peer Daytona Retana, MD      . magnesium hydroxide (  MILK OF MAGNESIA) suspension 30 mL  30 mL Oral Daily PRN Laverle Hobby, PA-C      . nicotine polacrilex (NICORETTE) gum 2 mg  2 mg Oral PRN Jenne Campus, MD      . thyroid (ARMOUR) tablet 120 mg  120 mg Oral QAC breakfast Laverle Hobby, PA-C   120 mg at 12/03/14 4098  . topiramate (TOPAMAX) tablet 100 mg  100 mg Oral BID Laverle Hobby, PA-C   100 mg at 12/03/14 1643  . [START ON 12/04/2014] venlafaxine XR (EFFEXOR-XR) 24 hr capsule 75 mg  75 mg Oral Q breakfast Jenne Campus, MD        Lab Results:  Results for orders placed or performed during the hospital encounter of 12/02/14 (from the past 48 hour(s))  Hemoglobin A1c     Status: None   Collection Time: 12/02/14  6:20 AM  Result Value Ref Range   Hgb A1c MFr Bld 5.1 4.8 - 5.6 %    Comment: (NOTE)         Pre-diabetes: 5.7 - 6.4         Diabetes: >6.4         Glycemic control for adults with diabetes: <7.0    Mean Plasma Glucose 100 mg/dL    Comment: (NOTE) Performed At: [facility] [place], [place] [id number] Lindon Romp MD FA:[id number] Performed at [facility]   TSH     Status: Abnormal   Collection Time: 12/02/14  6:20 AM  Result Value Ref Range   TSH 0.014  (L) 0.350 - 4.500 uIU/mL    Comment: Performed at [facility]  Lipid panel     Status: Abnormal   Collection Time: 12/03/14  6:47 AM  Result Value Ref Range   Cholesterol 195 0 - 200 mg/dL   Triglycerides 122 <150 mg/dL   HDL 42 >40 mg/dL   Total CHOL/HDL Ratio 4.6 RATIO   VLDL 24 0 - 40 mg/dL   LDL Cholesterol 129 (H) 0 - 99 mg/dL    Comment:        Total Cholesterol/HDL:CHD Risk Coronary Heart Disease Risk Table                     Men   Women  1/2 Average Risk   3.4   3.3  Average Risk       5.0   4.4  2 X Average Risk   9.6   7.1  3 X Average Risk  23.4   11.0        Use the calculated Patient Ratio above and the CHD Risk Table to determine the patient's CHD Risk.        ATP III CLASSIFICATION (LDL):  <100     mg/dL   Optimal  100-129  mg/dL   Near or Above                    Optimal  130-159  mg/dL   Borderline  160-189  mg/dL   High  >190     mg/dL   Very High Performed at [facility]   T4, free     Status: None   Collection Time: 12/03/14  6:47 AM  Result Value Ref Range   Free T4 0.87 0.61 - 1.12 ng/dL    Comment: Performed at [facility]    Physical Findings: AIMS: Facial and Oral Movements Muscles  of Facial Expression: None, normal Lips and Perioral Area: None, normal Jaw: None, normal Tongue: None, normal,Extremity Movements Upper (arms, wrists, hands, fingers): None, normal Lower (legs, knees, ankles, toes): None, normal, Trunk Movements Neck, shoulders, hips: None, normal, Overall Severity Severity of abnormal movements (highest score from questions above): None, normal Incapacitation due to abnormal movements: None, normal Patient's awareness of abnormal movements (rate only patient's report): No Awareness, Dental Status Current problems with teeth and/or dentures?: No Does patient usually wear dentures?: No  CIWA:  CIWA-Ar Total: 2 COWS:      Assessment - patient remains depressed, sad , but today somewhat more  reactive in affect, less tearful. No active SI today and able to contract for safety. Patient knowledgeable about psychiatric medications and side effects and concerned , reluctant to try Seroquel due to potential weight gain. States she is wanting to try Effexor XR , Latuda. Has not been on these medications before . Of note, reports history of Bipolar Disorder, but over recent years has only experienced depression, which has been more protracted since her divorce .    Treatment Plan Summary: Daily contact with patient to assess and evaluate symptoms and progress in treatment, Medication management, Plan inpatient admission and medications as below  Topamax 100 mgr BID for migraine prophylaxis and for mood disorder  D/C Seroquel due to concerns about metabolic side effects, possible weight gain Start Latuda 40 mgrs QDAY for mood disorder Start Effexor XR 75 mgrs QDAY for depression D/C Wellbutrin XL  D/C  Aldactone- see above  Lamictal 200 mgrs QDAY for mood disorder  Encourage ongoing group, milieu participation to work on stressors, coping skills, enhance mood  Continue Armour Thyroid for Hypothyroidism .         Yasheka Fossett 12/03/2014, 6:15 PM

## 2014-12-03 NOTE — Progress Notes (Signed)
D: Pt presents with flat affect and depressed mood. On approach, pt eye-contact is intense and wide-eyed. Pt reported that she feels weak this morning and lacks energy. Pt stated that she's increasingly depressed and all she wants to do is stay in bed today. Pt irritable during shift assessment. Pt b/p med held d/t being hypotensive this morning. Pt given fluids and b/p rechecked and noted to be stabilized. Pt denies suicidal thoughts this morning and verbally contracts for safety. Pt observed sitting in the dayroom this morning engaging with other pts. Pt attending meals in cafeteria. A: Medications administered as ordered per MD. Verbal support given. Pt encouraged to attend groups. 15 minute checks performed for safety.  R: Pt receptive to tx.

## 2014-12-03 NOTE — Progress Notes (Signed)
Adult Psychoeducational Group Note  Date:  12/03/2014 Time:  10:33 PM  Group Topic/Focus:  Wrap-Up Group:   The focus of this group is to help patients review their daily goal of treatment and discuss progress on daily workbooks.  Participation Level:  Active  Participation Quality:  Appropriate  Affect:  Appropriate  Cognitive:  Alert  Insight: Appropriate  Engagement in Group:  Engaged  Modes of Intervention:  Discussion  Additional Comments:  Patient stated she had an okay day. Patient stated she got a chance to talk with her doctor about her meds.   Taffney L Deas 12/03/2014, 10:33 PM

## 2014-12-03 NOTE — [facility] Group Notes (Signed)
[facility] LCSW Group Therapy  12/03/2014 4:12 PM  Type of Therapy:  Group Therapy  Participation Level:  Minimal  Participation Quality:  Attentive  Affect:  Depressed and Flat  Cognitive:  Oriented  Insight:  Developing/Improving  Engagement in Therapy:  Developing/Improving  Modes of Intervention:  Discussion, Exploration and Support   Emotion Regulation: This group focused on both positive and negative emotion identification and allowed group members to process ways to identify feelings, regulate negative emotions, and find healthy ways to manage internal/external emotions. Group members were asked to reflect on a time when their reaction to an emotion led to a negative outcome and explored how alternative responses using emotion regulation would have benefited them. Group members were also asked to discuss a time when emotion regulation was utilized when a negative emotion was experienced.  Summary of Progress/Problems:  Patient was silent through most of group but was able to discuss her difficulty w any social interactions - "it is difficult to even hold a conversation with you like this", appeared to be significantly fatigued and withdrawn.  Did brighten when discussing ways her situation was similar to others, states "I hate myself", expressed extremely negative self image, states her job is significant source of distress and feels overwhelmed at work.   Sallee Lange 12/03/2014, 4:12 PM

## 2014-12-03 NOTE — Clinical Social Work Note (Signed)
CSW phoned patient's supervisor at patients request to let supervisor know patient was hospitalized.    Santa Genera, LCSW Clinical Social Worker

## 2014-12-03 NOTE — Progress Notes (Signed)
D: Patient in the hallway on approach.  Patient states she has tried to be more visible on the unit today.  Patient states her diet has been a little better and she is drinking Ensure.  Patient states she is passive SI but verbally contracts for safety.  Patient denies HI and denies AVH.   A: Staff to monitor Q 15 mins for safety.  Encouragement and support offered.  Scheduled medications administered per orders. R: Patient remains safe on the unit.  Patient attended group tonight.  Patient visible on the unit.  Patient taking administered medications

## 2014-12-03 NOTE — [facility] Group Notes (Signed)
[facility] LCSW Aftercare Discharge Planning Group Note  12/03/2014 8:45 AM  Pt did not attend, declined invitation.   Chad Cordial, LCSWA 12/03/2014 9:28 AM

## 2014-12-04 LAB — HEMOGLOBIN A1C
Hgb A1c MFr Bld: 5.1 % (ref 4.8–5.6)
Mean Plasma Glucose: 100 mg/dL

## 2014-12-04 MED ORDER — TRAZODONE HCL 100 MG PO TABS
100.0000 mg | ORAL_TABLET | Freq: Every evening | ORAL | Status: DC | PRN
  Administered 2014-12-04 – 2014-12-10 (×7): 100 mg via ORAL
  Filled 2014-12-04 (×7): qty 1

## 2014-12-04 NOTE — [facility] Group Notes (Signed)
[facility] Mental Health Association Group Therapy 12/04/2014 1:15pm  Type of Therapy: Mental Health Association Presentation  Participation Level: Active  Participation Quality: Attentive  Affect: Appropriate  Cognitive: Oriented  Insight: Developing/Improving  Engagement in Therapy: Engaged  Modes of Intervention: Discussion, Education and Socialization  Summary of Progress/Problems: Mental Health Association (MHA) Speaker came to talk about his personal journey with substance abuse and addiction. The pt processed ways by which to relate to the speaker. MHA speaker provided handouts and educational information pertaining to groups and services offered by the [facility]. Pt was engaged in speaker's presentation and was receptive to resources provided.    Chad Cordial, LCSWA 12/04/2014 1:47 PM

## 2014-12-04 NOTE — Progress Notes (Signed)
Adult Psychoeducational Group Note  Date:  12/04/2014 Time:  0900  Group Topic/Focus:  Orientation:   The focus of this group is to educate the patient on the purpose and policies of crisis stabilization and provide a format to answer questions about their admission.  The group details unit policies and expectations of patients while admitted.  Participation Level:  Active  Participation Quality:  Appropriate  Affect:  Appropriate  Cognitive:  Appropriate  Insight: Appropriate  Engagement in Group:  Engaged  Modes of Intervention:  Orientation  Additional Comments:  Enjoys roller coaster and universal.  White, Patrice L 12/04/2014, 11:20 AM

## 2014-12-04 NOTE — Progress Notes (Signed)
Adult Psychoeducational Group Note  Date:  12/04/2014 Time:  9:15 PM  Group Topic/Focus:  Wrap-Up Group:   The focus of this group is to help patients review their daily goal of treatment and discuss progress on daily workbooks.  Pt attended karaoke.    Cleotilde Neer 12/04/2014, 10:29 PM

## 2014-12-04 NOTE — Tx Team (Signed)
Interdisciplinary Treatment Plan Update (Adult) Date: 12/04/2014   Date: 12/04/2014 1:47 PM  Progress in Treatment:  Attending groups: Yes Participating in groups: Yes, minimally Taking medication as prescribed: Yes  Tolerating medication: Yes  Family/Significant othe contact made: No, CSW attempting to make contact with mother Patient understands diagnosis: Yes Discussing patient identified problems/goals with staff: Yes  Medical problems stabilized or resolved: Yes  Denies suicidal/homicidal ideation: Yes Patient has not harmed self or Others: Yes   New problem(s) identified: None identified at this time.   Discharge Plan or Barriers: Pt will return home and follow-up with Letta Moynahan and [facility]  Additional comments: n/a   Reason for Continuation of Hospitalization:  Depression Anxiety  Medication stabilization Suicidal ideation  Estimated length of stay: 3-5 days  Review of initial/current patient goals per problem list:   1.  Goal(s): Patient will participate in aftercare plan  Met:  Yes  Target date: 3-5 days from date of admission   As evidenced by: Patient will participate within aftercare plan AEB aftercare provider and housing plan at discharge being identified.   12/02/14: pt will return home and follow-up with Letta Moynahan.  2.  Goal (s): Patient will exhibit decreased depressive symptoms and suicidal ideations.  Met:  No  Target date: 3-5 days from date of admission   As evidenced by: Patient will utilize self rating of depression at 3 or below and demonstrate decreased signs of depression or be deemed stable for discharge by MD. 12/02/14: Pt was admitted with symptoms of depression, rating 10/10. Pt continues to present with flat affect and depressive symptoms.  Pt will demonstrate decreased symptoms of depression and rate depression at 3/10 or lower prior to discharge. 12/04/14: Pt rates depression at 7/10; denies  SI.  3.  Goal(s): Patient will demonstrate decreased signs and symptoms of anxiety.  Met:  Progressing  Target date: 3-5 days from date of admission   As evidenced by: Patient will utilize self rating of anxiety at 3 or below and demonstrated decreased signs of anxiety, or be deemed stable for discharge by MD 12/02/14: Pt was admitted with increased levels of anxiety and is currently rating those symptoms highly. Pt will demonstrated decreased symptoms of anxiety and rate it at 3/10 prior to d/c. 12/04/14: Pt rates anxiety at 4/10.  Attendees:  Patient:    Family:    Physician: Dr. Parke Poisson, MD  12/04/2014 1:47 PM  Nursing: Lars Pinks, RN Case manager  12/04/2014 1:47 PM  Clinical Social Worker Norman Clay, MSW 12/04/2014 1:47 PM  Other: Lucinda Dell, Beverly Sessions Liasion 12/04/2014 1:47 PM  Clinical:  Darrol Angel, RN 12/04/2014 1:47 PM  Other: , RN Charge Nurse 12/04/2014 1:47 PM  Other:     Peri Maris, [place] MSW

## 2014-12-04 NOTE — Progress Notes (Signed)
D: Pt presents with flat affect and depressed mood. Pt reports feeling fatigue d/t lack of energy. Pt rates depression 6/10. Anxiety 4/10. Hopeless 7/10. Pt denies suicidal thoughts. Pt appears preoccupied at times, with intense stare and wide eyes. Pt stated goal is to "get out of bed today".  A: Medications administered as ordered per MD. Verbal support given. Pt encouraged to attend groups. 15 minute checks performed for safety. R: Pt receptive to tx.

## 2014-12-04 NOTE — Progress Notes (Signed)
Patient ID: Tina [name], female   DOB: Jan 24, 1971, 44 y.o.   MRN: [id number] [facility] MD Progress Note  12/04/2014 1:43 PM Tina [name]  MRN:  [id number] Subjective:   She reports feeling " a little better", but still depressed, sad, ruminative . She states she feels vaguely " foggy", " tingly", which she attributes as side effect to new medications - started Effexor XR and Latuda yesterday. Objective :  I have discussed case with treatment team and have met with patient. She presents depressed, sad, but improved compared to admission and with a somewhat more reactive affect . Still tearful when discussing her separation and not having constant access to her children- she does have joint custody. She describes vague side effects, as above, but does not appear to be in any discomfort or distress and states these symptoms relatively mild at this time , she wants to continue current doses for now . Going to groups, active in milieu, no disruptive or agitated behaviors on unit . Responsive to support, encouragement, review of coping skills- affect improves to some degree during session.  Principal Problem: Bipolar I disorder, current or most recent episode depressed, in partial remission Diagnosis:   Patient Active Problem List   Diagnosis Date Noted  . Bipolar affective disorder, current episode severe [F31.9] 12/02/2014  . Bipolar I disorder, current or most recent episode depressed, in partial remission [F31.75] 07/11/2014  . Bipolar 1 disorder, mixed [F31.60] 06/27/2014  . Disease of thyroid gland [E07.9] 06/27/2014  . Anxiety [F41.9] 06/27/2014  . 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 Bipolar 1 disorder, depressed, partial remission [F31.75] 06/27/2014  . 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 Alcohol use disorder, severe, dependence [F10.20] 06/24/2014  . Bipolar disorder, unspecified [F31.9] 02/16/2013  .  Adrenal insufficiency [E27.40]   . Acne [L70.9]   . Cellulitis [L03.90]   . Hx of Lyme disease [Z86.19]   . Headache [R51]   . 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 Bipolar I disorder, most recent episode (or current) manic [F31.10] 08/29/2012  . Unspecified hypothyroidism [E03.9] 12/23/2010  . Myalgia [M79.1] 12/23/2010  . Fatigue [R53.83] 12/23/2010  . Unspecified vitamin deficiency [E56.9] 12/23/2010  . Depression [F32.9]    Total Time spent with patient: 20 minutes   Past Medical History:  Past Medical History  Diagnosis Date  . History of chicken pox   . Hypothyroidism   . Genital warts   . Depression   . Bipolar 2 disorder   . Adrenal insufficiency   . Acne   . Cellulitis   . Hx of Lyme disease   . Headache     Past Surgical History  Procedure Laterality Date  . Tonsillectomy    . Cesarean section  2003, 2007, 2011  . Umbilical hernia repair    . Inguinal hernia repair Bilateral    Family History:  Family History  Problem Relation Age of Onset  . Heart disease Other     Grandparent  . Alcohol abuse Paternal Grandmother    Social History:  History  Alcohol Use  . 7.2 - 10.8 oz/week  . 2-8 Cans of beer, 10 Shots of liquor per week    Comment: social drinker-few drinks a week      History  Drug Use No    Social History   Social History  . Marital Status: Married    Spouse Name: N/A  . Number of Children: N/A  .  Years of Education: 16   Occupational History  . HOMEMAKER    Social History Main Topics  . Smoking status: Current Every Day Smoker -- 1.50 packs/day for 29 years    Types: Cigarettes  . Smokeless tobacco: None  . Alcohol Use: 7.2 - 10.8 oz/week    2-8 Cans of beer, 10 Shots of liquor per week     Comment: social drinker-few drinks a week   . Drug Use: No  . Sexual Activity: Not Currently   Other Topics Concern  . None   Social History Narrative   Regular exercise-yes   Additional History:    Sleep:  Fair   Appetite:    Improving    Assessment:   Musculoskeletal: Strength & Muscle Tone: within normal limits Gait & Station: normal Patient leans: N/A   Psychiatric Specialty Exam: Physical Exam  ROS no chest pain, no shortness of breath, no  Nausea ,  No vomiting  Blood pressure 91/59, pulse 108, temperature 98.2 F (36.8 C), temperature source Oral, resp. rate 18, height 5" (0.127 m), weight 100 lb (45.36 kg), SpO2 100 %.Body mass index is 2,812.33 kg/(m^2).  General Appearance: improved grooming   Eye Contact::  Good  Speech:  Normal Rate  Volume:  Normal  Mood:still depressed but somewhat improved compared to admission  Affect:  Constricted- does smile briefly at times   Thought Process:  Goal Directed and Linear  Orientation:  Full (Time, Place, and Person)  Thought Content:  denies hallucinations, no delusions , not internally preoccupied - ruminative about psychosocial stressors , mostly family stressors   Suicidal Thoughts:  No currently able to contract for safety on the unit, denies thoughts of hurting self or of SI .   Homicidal Thoughts:  No  Memory:  recent and remote grossly intact   Judgement:  Fair  Insight:  Present  Psychomotor Activity:  Normal  Concentration:  Good  Recall:  Good  Fund of Knowledge:Good  Language: Good  Akathisia:  Negative  Handed:  Right  AIMS (if indicated):     Assets:  Desire for Improvement Housing Resilience Vocational/Educational  ADL's:   Improved   Cognition: WNL  Sleep:        Current Medications: Current Facility-Administered Medications  Medication Dose Route Frequency Provider Last Rate Last Dose  . acetaminophen (TYLENOL) tablet 650 mg  650 mg Oral Q6H PRN Laverle Hobby, PA-C      . alum & mag hydroxide-simeth (MAALOX/MYLANTA) 200-200-20 MG/5ML suspension 30 mL  30 mL Oral Q4H PRN Laverle Hobby, PA-C      . feeding supplement (ENSURE ENLIVE) (ENSURE ENLIVE) liquid 237 mL  237 mL Oral BID BM Myer Peer Cobos, MD   237 mL at  12/04/14 0834  . lamoTRIgine (LAMICTAL) tablet 200 mg  200 mg Oral QHS Laverle Hobby, PA-C   200 mg at 12/03/14 2201  . lurasidone (LATUDA) tablet 40 mg  40 mg Oral Q breakfast Jenne Campus, MD   40 mg at 12/04/14 0834  . magnesium hydroxide (MILK OF MAGNESIA) suspension 30 mL  30 mL Oral Daily PRN Laverle Hobby, PA-C      . nicotine polacrilex (NICORETTE) gum 2 mg  2 mg Oral PRN Jenne Campus, MD   2 mg at 12/04/14 1320  . thyroid (ARMOUR) tablet 120 mg  120 mg Oral QAC breakfast Laverle Hobby, PA-C   120 mg at 12/04/14 9211  . topiramate (TOPAMAX) tablet 100 mg  100 mg Oral BID Laverle Hobby, PA-C   100 mg at 12/04/14 5400  . traZODone (DESYREL) tablet 100 mg  100 mg Oral QHS PRN Jenne Campus, MD      . venlafaxine XR (EFFEXOR-XR) 24 hr capsule 75 mg  75 mg Oral Q breakfast Jenne Campus, MD   75 mg at 12/04/14 8676    Lab Results:  Results for orders placed or performed during the hospital encounter of 12/02/14 (from the past 48 hour(s))  Hemoglobin A1c     Status: None   Collection Time: 12/03/14  6:47 AM  Result Value Ref Range   Hgb A1c MFr Bld 5.1 4.8 - 5.6 %    Comment: (NOTE)         Pre-diabetes: 5.7 - 6.4         Diabetes: >6.4         Glycemic control for adults with diabetes: <7.0    Mean Plasma Glucose 100 mg/dL    Comment: (NOTE) Performed At: [facility] [place], [place] [id number] Lindon Romp MD [id number] Performed at [facility]   Lipid panel     Status: Abnormal   Collection Time: 12/03/14  6:47 AM  Result Value Ref Range   Cholesterol 195 0 - 200 mg/dL   Triglycerides 122 <150 mg/dL   HDL 42 >40 mg/dL   Total CHOL/HDL Ratio 4.6 RATIO   VLDL 24 0 - 40 mg/dL   LDL Cholesterol 129 (H) 0 - 99 mg/dL    Comment:        Total Cholesterol/HDL:CHD Risk Coronary Heart Disease Risk Table                     Men   Women  1/2 Average Risk   3.4   3.3  Average Risk       5.0   4.4  2 X Average  Risk   9.6   7.1  3 X Average Risk  23.4   11.0        Use the calculated Patient Ratio above and the CHD Risk Table to determine the patient's CHD Risk.        ATP III CLASSIFICATION (LDL):  <100     mg/dL   Optimal  100-129  mg/dL   Near or Above                    Optimal  130-159  mg/dL   Borderline  160-189  mg/dL   High  >190     mg/dL   Very High Performed at [facility]   T4, free     Status: None   Collection Time: 12/03/14  6:47 AM  Result Value Ref Range   Free T4 0.87 0.61 - 1.12 ng/dL    Comment: Performed at [facility]    Physical Findings: AIMS: Facial and Oral Movements Muscles of Facial Expression: None, normal Lips and Perioral Area: None, normal Jaw: None, normal Tongue: None, normal,Extremity Movements Upper (arms, wrists, hands, fingers): None, normal Lower (legs, knees, ankles, toes): None, normal, Trunk Movements Neck, shoulders, hips: None, normal, Overall Severity Severity of abnormal movements (highest score from questions above): None, normal Incapacitation due to abnormal movements: None, normal Patient's awareness of abnormal movements (rate only patient's report): No Awareness, Dental Status Current problems with teeth and/or dentures?: No Does patient usually wear dentures?: No  CIWA:  CIWA-Ar Total: 2 COWS:  Assessment -  Depression is partially improved, but still describes sadness, and affect still tends to be constricted, although more reactive . At this time no active SI. No current symptoms of mania or of psychosis. Remains ruminative .  Tolerating medications - Effexor XR and Latuda both new medications for her- well, but reports vague subjective feeling of being " foggy". She is fully alert and oriented x 3 ..    Treatment Plan Summary: Daily contact with patient to assess and evaluate symptoms and progress in treatment, Medication management, Plan inpatient admission and medications as below  Continue Topamax  100 mgr BID for migraine prophylaxis and for mood disorder  Continue  Latuda 40 mgrs QDAY for mood disorder Continue  Effexor XR 75 mgrs QDAY for depression  Continue Lamictal 200 mgrs QDAY for mood disorder  Encourage ongoing group, milieu participation to work on stressors, coping skills, enhance mood  Monitor for potential side effects from new medication trials .  Increase Trazodone to 100 mgrs QHS PRN for Insomnia . Continue Armour Thyroid for Hypothyroidism .         COBOS, FERNANDO 12/04/2014, 1:43 PM

## 2014-12-04 NOTE — Progress Notes (Addendum)
D: Patient in the hallway on approach.  Patient has intense stare.  Patient states she had a better day today. Patient states she has been trying to eat well and increase her fluid intake.  Patient states she has been more active today.  Patient states her body is still trying to adjust to the new medications she is taking. Patient denies SI/HI and denies AVH. A: Staff to monitor Q 15 mins for safety.  Encouragement and support offered.  Scheduled medications administered per orders.  Trazodone administered prn for sleep. R: Patient remains safe on the unit.  Patient attended group tonight.  Patient visible on the unit and interacting with peers.  Patient taking administered medications.

## 2014-12-05 MED ORDER — BISACODYL 5 MG PO TBEC
5.0000 mg | DELAYED_RELEASE_TABLET | Freq: Every day | ORAL | Status: DC | PRN
  Administered 2014-12-05: 5 mg via ORAL
  Filled 2014-12-05: qty 1

## 2014-12-05 NOTE — Progress Notes (Signed)
Adult Psychoeducational Group Note  Date:  12/05/2014 Time:  9:12 PM  Group Topic/Focus:  Wrap-Up Group:   The focus of this group is to help patients review their daily goal of treatment and discuss progress on daily workbooks.  Participation Level:  Active  Participation Quality:  Appropriate and Attentive  Affect:  Appropriate  Cognitive:  Appropriate  Insight: Appropriate and Good  Engagement in Group:  Engaged  Modes of Intervention:  Education  Additional Comments:  Pt relapse prevention goal is to go see the therapist and psychiatrist outside of here and take medication. Pt goal when leaving here is to distress her life by quitting her job.   Merlinda Frederick 12/05/2014, 9:12 PM

## 2014-12-05 NOTE — [facility] Group Notes (Signed)
[facility] LCSW Group Therapy 12/05/2014 1:15pm  Type of Therapy: Group Therapy- Feelings Around Relapse and Recovery  Participation Level: Active   Participation Quality:  Appropriate  Affect:  Appropriate  Cognitive: Alert and Oriented   Insight:  Developing   Engagement in Therapy: Developing/Improving and Engaged   Modes of Intervention: Clarification, Confrontation, Discussion, Education, Exploration, Limit-setting, Orientation, Problem-solving, Rapport Building, Reality Testing, Socialization and Support  Summary of Progress/Problems: The topic for today was feelings about relapse. The group discussed what relapse prevention is to them and identified triggers that they are on the path to relapse. Members also processed their feeling towards relapse and were able to relate to common experiences. Group also discussed coping skills that can be used for relapse prevention.     Therapeutic Modalities:   Cognitive Behavioral Therapy Solution-Focused Therapy Assertiveness Training Relapse Prevention Therapy    Lauren Carter, LCSWA [phone] 12/05/2014 4:56 PM  

## 2014-12-05 NOTE — Progress Notes (Signed)
Adult Psychoeducational Group Note  Date:  12/05/2014 Time:  3:03 PM  Group Topic/Focus:  Relapse Prevention Planning:   The focus of this group is to define relapse and discuss the need for planning to combat relapse.  Participation Level:  Active  Participation Quality:  Attentive  Affect:  Appropriate  Cognitive:  Appropriate  Insight: Good  Engagement in Group:  Engaged  Modes of Intervention:  Discussion  Additional Comments:  Pt id participate in group discussion this morning.  Pt stated that she has been under stress and continues to suffer with depression.  Pt wants to continue therapy and continue with her medication.    Alisia Vanengen R Yvonna Brun 12/05/2014, 3:03 PM

## 2014-12-05 NOTE — Plan of Care (Signed)
Problem: Alteration in mood Goal: LTG-Patient reports reduction in suicidal thoughts (Patient reports reduction in suicidal thoughts and is able to verbalize a safety plan for whenever patient is feeling suicidal)  Outcome: Progressing Pt denies SI and verbally contracted for safety.     

## 2014-12-05 NOTE — Progress Notes (Signed)
Recreation Therapy Notes  Date: 09.23.2016 Time: 9:30am Location: [address] Group room   Group Topic: Stress Management  Goal Area(s) Addresses:  Patient will actively participate in stress management techniques presented during session.   Behavioral Response: Did not attend.   Denise L Blanchfield, LRT/CTRS        Blanchfield, Denise L 12/05/2014 1:50 PM 

## 2014-12-05 NOTE — Progress Notes (Signed)
D: Pt continues to be very flat and depressed on the unit today. Pt also continues to be very isolative. Pt had a good night sleep,fair appetite, normal energy, and good concentration. Pt denied physical pain and has been cooperative with her mication and unit rules. Pt reported that her depression was a 5, her hopelessness was a 5, and that her anxiety was a 0. Pt reported being negative SI/HI, no AH/VH noted. A: 15 min checks continued for patient safety. R: Pts safety maintained.

## 2014-12-05 NOTE — Progress Notes (Addendum)
Patient ID: CHARMIAN FORBIS, female   DOB: 1970-12-17, 44 y.o.   MRN: [id number] [facility] MD Progress Note  12/05/2014 6:40 PM LYSANDRA LOUGHMILLER  MRN:  [id number] Subjective:    She reports she is feeling better, although still depressed . Denies medication side effects. Today has felt " tired" so has stayed in her room more, but states her actual mood is better . States she had a good conversation with her ex husband , and that children are doing "OK" which helps her feel better , as well.  Objective :  I have discussed case with treatment team and have met with patient. Mood is gradually improving, less depressed . She does not feel she is back to her baseline mood, however, and states she still feels depressed . She denies any SI. As per staff, she is reporting improved mood , but continues to present with a constricted , sad affect at times . Tolerating medications well, reports the vague " tingling feeling" and fogginess she had experienced have improved .  Going to groups, active in milieu, no disruptive or agitated behaviors on unit . Responsive to support, encouragement. BP has tended to be low - but not tachycardic or orthostatic at this time. Denies falls or dizziness .  Principal Problem: Bipolar I disorder, current or most recent episode depressed, in partial remission Diagnosis:   Patient Active Problem List   Diagnosis Date Noted  . Bipolar affective disorder, current episode severe [F31.9] 12/02/2014  . Bipolar I disorder, current or most recent episode depressed, in partial remission [F31.75] 07/11/2014  . Bipolar 1 disorder, mixed [F31.60] 06/27/2014  . Disease of thyroid gland [E07.9] 06/27/2014  . Anxiety [F41.9] 06/27/2014  . 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 Bipolar 1 disorder, depressed, partial remission [F31.75] 06/27/2014  . 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 Alcohol use disorder,  severe, dependence [F10.20] 06/24/2014  . Bipolar disorder, unspecified [F31.9] 02/16/2013  . Adrenal insufficiency [E27.40]   . Acne [L70.9]   . Cellulitis [L03.90]   . Hx of Lyme disease [Z86.19]   . Headache [R51]   . 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 Bipolar I disorder, most recent episode (or current) manic [F31.10] 08/29/2012  . Unspecified hypothyroidism [E03.9] 12/23/2010  . Myalgia [M79.1] 12/23/2010  . Fatigue [R53.83] 12/23/2010  . Unspecified vitamin deficiency [E56.9] 12/23/2010  . Depression [F32.9]    Total Time spent with patient: 20 minutes   Past Medical History:  Past Medical History  Diagnosis Date  . History of chicken pox   . Hypothyroidism   . Genital warts   . Depression   . Bipolar 2 disorder   . Adrenal insufficiency   . Acne   . Cellulitis   . Hx of Lyme disease   . Headache     Past Surgical History  Procedure Laterality Date  . Tonsillectomy    . Cesarean section  2003, 2007, 2011  . Umbilical hernia repair    . Inguinal hernia repair Bilateral    Family History:  Family History  Problem Relation Age of Onset  . Heart disease Other     Grandparent  . Alcohol abuse Paternal Grandmother    Social History:  History  Alcohol Use  . 7.2 - 10.8 oz/week  . 2-8 Cans of beer, 10 Shots of liquor per week    Comment: social drinker-few drinks a week      History  Drug Use  No    Social History   Social History  . Marital Status: Married    Spouse Name: N/A  . Number of Children: N/A  . Years of Education: 16   Occupational History  . HOMEMAKER    Social History Main Topics  . Smoking status: Current Every Day Smoker -- 1.50 packs/day for 29 years    Types: Cigarettes  . Smokeless tobacco: None  . Alcohol Use: 7.2 - 10.8 oz/week    2-8 Cans of beer, 10 Shots of liquor per week     Comment: social drinker-few drinks a week   . Drug Use: No  . Sexual Activity: Not Currently   Other Topics Concern  . None   Social  History Narrative   Regular exercise-yes   Additional History:    Sleep:  Fair   Appetite:   Improving    Assessment:   Musculoskeletal: Strength & Muscle Tone: within normal limits Gait & Station: normal Patient leans: N/A   Psychiatric Specialty Exam: Physical Exam  ROS no chest pain, no shortness of breath, no  Nausea ,  No vomiting, denies dizziness .  Blood pressure 90/72, pulse 88, temperature 97.9 F (36.6 C), temperature source Oral, resp. rate 16, height 5" (0.127 m), weight 100 lb (45.36 kg), SpO2 100 %.Body mass index is 2,812.33 kg/(m^2).  General Appearance: improved grooming   Eye Contact::  Good  Speech:  Normal Rate  Volume:  Normal  Mood: gradually improving mood , but still depressed   Affect:  Less constricted, more reactive  Thought Process:  Goal Directed and Linear  Orientation:  Full (Time, Place, and Person)  Thought Content:  denies hallucinations, no delusions , not internally preoccupied - ruminative about psychosocial stressors , mostly family stressors   Suicidal Thoughts:  No currently able to contract for safety on the unit, denies thoughts of hurting self or of SI .   Homicidal Thoughts:  No  Memory:  recent and remote grossly intact   Judgement:  Fair  Insight:  Present  Psychomotor Activity:  Normal  Concentration:  Good  Recall:  Good  Fund of Knowledge:Good  Language: Good  Akathisia:  Negative  Handed:  Right  AIMS (if indicated):     Assets:  Desire for Improvement Housing Resilience Vocational/Educational  ADL's:   Improved   Cognition: WNL  Sleep:        Current Medications: Current Facility-Administered Medications  Medication Dose Route Frequency Provider Last Rate Last Dose  . acetaminophen (TYLENOL) tablet 650 mg  650 mg Oral Q6H PRN Laverle Hobby, PA-C      . alum & mag hydroxide-simeth (MAALOX/MYLANTA) 200-200-20 MG/5ML suspension 30 mL  30 mL Oral Q4H PRN Laverle Hobby, PA-C      . feeding supplement  (ENSURE ENLIVE) (ENSURE ENLIVE) liquid 237 mL  237 mL Oral BID BM Myer Peer Cobos, MD   237 mL at 12/05/14 1704  . lamoTRIgine (LAMICTAL) tablet 200 mg  200 mg Oral QHS Laverle Hobby, PA-C   200 mg at 12/04/14 2139  . lurasidone (LATUDA) tablet 40 mg  40 mg Oral Q breakfast Jenne Campus, MD   40 mg at 12/05/14 0827  . magnesium hydroxide (MILK OF MAGNESIA) suspension 30 mL  30 mL Oral Daily PRN Laverle Hobby, PA-C   30 mL at 12/05/14 1325  . nicotine polacrilex (NICORETTE) gum 2 mg  2 mg Oral PRN Jenne Campus, MD   2 mg at 12/04/14  1320  . thyroid (ARMOUR) tablet 120 mg  120 mg Oral QAC breakfast Laverle Hobby, PA-C   120 mg at 12/05/14 0646  . topiramate (TOPAMAX) tablet 100 mg  100 mg Oral BID Laverle Hobby, PA-C   100 mg at 12/05/14 1703  . traZODone (DESYREL) tablet 100 mg  100 mg Oral QHS PRN Jenne Campus, MD   100 mg at 12/04/14 2140  . venlafaxine XR (EFFEXOR-XR) 24 hr capsule 75 mg  75 mg Oral Q breakfast Jenne Campus, MD   75 mg at 12/05/14 2527    Lab Results:  No results found for this or any previous visit (from the past 48 hour(s)).  Physical Findings: AIMS: Facial and Oral Movements Muscles of Facial Expression: None, normal Lips and Perioral Area: None, normal Jaw: None, normal Tongue: None, normal,Extremity Movements Upper (arms, wrists, hands, fingers): None, normal Lower (legs, knees, ankles, toes): None, normal, Trunk Movements Neck, shoulders, hips: None, normal, Overall Severity Severity of abnormal movements (highest score from questions above): None, normal Incapacitation due to abnormal movements: None, normal Patient's awareness of abnormal movements (rate only patient's report): No Awareness, Dental Status Current problems with teeth and/or dentures?: No Does patient usually wear dentures?: No  CIWA:  CIWA-Ar Total: 2 COWS:      Assessment -   Gradual improvement of mood and affect . Still depressed , but presenting with improved range  of affect, less tearful episodes, and more future oriented . She has had some vague side effects from her new medication regimen ( Latuda and Effexor XR) but these are subsiding. No SI at this time. Participative in milieu.    Treatment Plan Summary: Daily contact with patient to assess and evaluate symptoms and progress in treatment, Medication management, Plan inpatient admission and medications as below  Continue Topamax 100 mgr BID for migraine prophylaxis and for mood disorder  Continue  Latuda 40 mgrs QDAY for mood disorder Continue  Effexor XR 75 mgrs QDAY for depression  Continue Lamictal 200 mgrs QDAY for mood disorder  Encourage ongoing group, milieu participation to work on stressors, coping skills, enhance mood   Increase Trazodone to 100 mgrs QHS PRN for Insomnia . Continue Armour Thyroid for Hypothyroidism .  Treatment team working on disposition [place], Felicita Gage 12/05/2014, 6:40 PM

## 2014-12-06 NOTE — Plan of Care (Signed)
Problem: Ineffective individual coping Goal: STG: Patient will remain free from self harm Outcome: Progressing Pt remains free from self harm today     

## 2014-12-06 NOTE — Progress Notes (Signed)
Patient ID: Tina [name], female   DOB: 07/16/1970, 44 y.o.   MRN: [id number]  Pt currently presents with a flat affect and cooperative behavior. Pt forwards little to Clinical research associate and answers with one word questions. Per self inventory, pt rates depression at a 4, hopelessness 3 and anxiety 0. Pt's daily goal is to "continue to stay out of room and stay social" and they intend to do so by "stay out of room." Pt reports good sleep, a good appetite, normal energy and good concentration. Pt seen interacting with peers in the milieu, pt attends groups. Pt bradycardic this morning.  Pt provided with medications per providers orders. Pt's labs and vitals were monitored throughout the day. Pt supported emotionally and encouraged to express concerns and questions. Pt educated on medications. Pt encouraged to increase fluid intake.  Pt's safety ensured with 15 minute and environmental checks. Pt currently denies SI/HI and A/V hallucinations. Pt verbally agrees to seek staff if SI/HI or A/VH occurs and to consult with staff before acting on these thoughts. Will continue POC.

## 2014-12-06 NOTE — Progress Notes (Addendum)
Patient ID: Tina [name], female   DOB: Feb 26, 1971, 44 y.o.   MRN: [id number] [facility] MD Progress Note  12/06/2014 1:28 PM Tina [name]  MRN:  [id number]   Subjective:    She reports she is feeling better, although still depressed, and rates it a 4/10 down from 6/10. Denies medication side effects, although she just started the medication. Today she has felt more awake today, and reports resting well last night with about 7 hours of sleep. Denies SI?HI/AVH.   Objective :  I have discussed case and have met with patient. Mood is gradually improving, less depressed.  Although not back at  Baseline she feels better. She denies any SI. Tolerating medications well, medication s/e has improved mental fogginess.  Going to groups, active in milieu, no disruptive or agitated behaviors on unit . Responsive to support, encouragement. BP has tended to be low - but not tachycardic or orthostatic at this time. Denies falls or dizziness .   Principal Problem: Bipolar I disorder, current or most recent episode depressed, in partial remission Diagnosis:   Patient Active Problem List   Diagnosis Date Noted  . Bipolar affective disorder, current episode severe [F31.9] 12/02/2014  . Bipolar I disorder, current or most recent episode depressed, in partial remission [F31.75] 07/11/2014  . Bipolar 1 disorder, mixed [F31.60] 06/27/2014  . Disease of thyroid gland [E07.9] 06/27/2014  . Anxiety [F41.9] 06/27/2014  . 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 Bipolar 1 disorder, depressed, partial remission [F31.75] 06/27/2014  . 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 Alcohol use disorder, severe, dependence [F10.20] 06/24/2014  . Bipolar disorder, unspecified [F31.9] 02/16/2013  . Adrenal insufficiency [E27.40]   . Acne [L70.9]   . Cellulitis [L03.90]   . Hx of Lyme disease [Z86.19]   . Headache [R51]   . 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 Bipolar I disorder, most recent episode (or current) manic [F31.10] 08/29/2012  . Unspecified hypothyroidism [E03.9] 12/23/2010  . Myalgia [M79.1] 12/23/2010  . Fatigue [R53.83] 12/23/2010  . Unspecified vitamin deficiency [E56.9] 12/23/2010  . Depression [F32.9]    Total Time spent with patient: 20 minutes   Past Medical History:  Past Medical History  Diagnosis Date  . History of chicken pox   . Hypothyroidism   . Genital warts   . Depression   . Bipolar 2 disorder   . Adrenal insufficiency   . Acne   . Cellulitis   . Hx of Lyme disease   . Headache     Past Surgical History  Procedure Laterality Date  . Tonsillectomy    . Cesarean section  2003, 2007, 2011  . Umbilical hernia repair    . Inguinal hernia repair Bilateral    Family History:  Family History  Problem Relation Age of Onset  . Heart disease Other     Grandparent  . Alcohol abuse Paternal Grandmother    Social History:  History  Alcohol Use  . 7.2 - 10.8 oz/week  . 2-8 Cans of beer, 10 Shots of liquor per week    Comment: social drinker-few drinks a week      History  Drug Use No    Social History   Social History  . Marital Status: Married    Spouse Name: N/A  . Number of Children: N/A  . Years of Education: 16   Occupational History  . HOMEMAKER    Social History Main Topics  . Smoking  status: Current Every Day Smoker -- 1.50 packs/day for 29 years    Types: Cigarettes  . Smokeless tobacco: None  . Alcohol Use: 7.2 - 10.8 oz/week    2-8 Cans of beer, 10 Shots of liquor per week     Comment: social drinker-few drinks a week   . Drug Use: No  . Sexual Activity: Not Currently   Other Topics Concern  . None   Social History Narrative   Regular exercise-yes   Additional History:    Sleep:  Fair   Appetite:   Improving    Assessment:   Musculoskeletal: Strength & Muscle Tone: within normal limits Gait & Station: normal Patient leans: N/A   Psychiatric  Specialty Exam: Physical Exam   ROS  no chest pain, no shortness of breath, no  Nausea ,  No vomiting, denies dizziness .  Blood pressure 78/51, pulse 85, temperature 98.5 F (36.9 C), temperature source Oral, resp. rate 14, height 5" (0.127 m), weight 45.36 kg (100 lb), SpO2 100 %.Body mass index is 2,812.33 kg/(m^2).  General Appearance: improved grooming   Eye Contact::  Good  Speech:  Normal Rate  Volume:  Normal  Mood: gradually improving mood , but still depressed   Affect:  Less constricted, more reactive  Thought Process:  Goal Directed and Linear  Orientation:  Full (Time, Place, and Person)  Thought Content:  denies hallucinations, no delusions , not internally preoccupied - ruminative about psychosocial stressors , mostly family stressors   Suicidal Thoughts:  No currently able to contract for safety on the unit, denies thoughts of hurting self or of SI .   Homicidal Thoughts:  No  Memory:  recent and remote grossly intact   Judgement:  Fair  Insight:  Present  Psychomotor Activity:  Normal  Concentration:  Good  Recall:  Good  Fund of Knowledge:Good  Language: Good  Akathisia:  Negative  Handed:  Right  AIMS (if indicated):     Assets:  Desire for Improvement Housing Resilience Vocational/Educational  ADL's:   Improved   Cognition: WNL  Sleep:  Number of Hours: 6.25     Current Medications: Current Facility-Administered Medications  Medication Dose Route Frequency Provider Last Rate Last Dose  . acetaminophen (TYLENOL) tablet 650 mg  650 mg Oral Q6H PRN Laverle Hobby, PA-C      . alum & mag hydroxide-simeth (MAALOX/MYLANTA) 200-200-20 MG/5ML suspension 30 mL  30 mL Oral Q4H PRN Laverle Hobby, PA-C      . bisacodyl (DULCOLAX) EC tablet 5 mg  5 mg Oral Daily PRN Harriet Butte, NP   5 mg at 12/05/14 2145  . feeding supplement (ENSURE ENLIVE) (ENSURE ENLIVE) liquid 237 mL  237 mL Oral BID BM Myer Peer Cobos, MD   237 mL at 12/05/14 1704  . lamoTRIgine  (LAMICTAL) tablet 200 mg  200 mg Oral QHS Laverle Hobby, PA-C   200 mg at 12/05/14 2145  . lurasidone (LATUDA) tablet 40 mg  40 mg Oral Q breakfast Jenne Campus, MD   40 mg at 12/06/14 0853  . magnesium hydroxide (MILK OF MAGNESIA) suspension 30 mL  30 mL Oral Daily PRN Laverle Hobby, PA-C   30 mL at 12/05/14 1325  . nicotine polacrilex (NICORETTE) gum 2 mg  2 mg Oral PRN Jenne Campus, MD   2 mg at 12/04/14 1320  . thyroid (ARMOUR) tablet 120 mg  120 mg Oral QAC breakfast Laverle Hobby, PA-C   120  mg at 12/06/14 0646  . topiramate (TOPAMAX) tablet 100 mg  100 mg Oral BID Laverle Hobby, PA-C   100 mg at 12/06/14 0853  . traZODone (DESYREL) tablet 100 mg  100 mg Oral QHS PRN Jenne Campus, MD   100 mg at 12/05/14 2145  . venlafaxine XR (EFFEXOR-XR) 24 hr capsule 75 mg  75 mg Oral Q breakfast Jenne Campus, MD   75 mg at 12/06/14 7471    Lab Results:  No results found for this or any previous visit (from the past 48 hour(s)).  Physical Findings: AIMS: Facial and Oral Movements Muscles of Facial Expression: None, normal Lips and Perioral Area: None, normal Jaw: None, normal Tongue: None, normal,Extremity Movements Upper (arms, wrists, hands, fingers): None, normal Lower (legs, knees, ankles, toes): None, normal, Trunk Movements Neck, shoulders, hips: None, normal, Overall Severity Severity of abnormal movements (highest score from questions above): None, normal Incapacitation due to abnormal movements: None, normal Patient's awareness of abnormal movements (rate only patient's report): No Awareness, Dental Status Current problems with teeth and/or dentures?: No Does patient usually wear dentures?: No  CIWA:  CIWA-Ar Total: 2 COWS:      Assessment -   Gradual improvement of mood and affect . Still depressed , but presenting with improved range of affect, less tearful episodes, and more future oriented . She has had some vague side effects from her new medication regimen  ( Latuda and Effexor XR) but these are subsiding. No SI at this time. Participative in milieu.    Treatment Plan Summary: Daily contact with patient to assess and evaluate symptoms and progress in treatment, Medication management, Plan inpatient admission and medications as below  Continue Topamax 100 mgr BID for migraine prophylaxis and for mood disorder  Continue  Latuda 40 mgrs QDAY for mood disorder Continue  Effexor XR 75 mgrs QDAY for depression  Continue Lamictal 200 mgrs QDAY for mood disorder  Encourage ongoing group, milieu participation to work on stressors, coping skills, enhance mood   Increase Trazodone to 100 mgrs QHS PRN for Insomnia . Continue Armour Thyroid for Hypothyroidism .  Treatment team working on disposition [place], [place] 12/06/2014, 1:28 PM  Reviewed the information documented and agree with the treatment plan.  JONNALAGADDA,JANARDHAHA R. 12/08/2014 11:52 AM

## 2014-12-06 NOTE — [facility] Group Notes (Signed)
[facility] LCSW Group Therapy  12/06/2014   11:10 AM  Type of Therapy:  Group Therapy  Participation Level:  Minimal  Participation Quality:  Attentive  Affect:  Depressed and Flat  Cognitive:  Oriented  Insight:  Lacking  Engagement in Therapy:  Limited  Modes of Intervention:  Discussion, Exploration, Rapport Building, Socialization and Support  Summary of Progress/Problems:  Summary of Progress/Problems: The main focus of today's process group was for the patient to identify ways in which they have in the past sabotaged their own recovery. Motivational Interviewing was utilized to ask the group members what they get out of their substance use, and what reasons they may have for wanting to change. The Stages of Change were explained using a handout, and patients identified where they currently are with regard to stages of change. The patient expressed that she self sabotages by isolating to point where she remains in bed  for days, not eating or otherwise caring for self. She shared when questioned that she would not let any of her three children do the same yet believes it is okay for her. Patient lacking insight currently as to where she is in cycle of change.   Tina Bern, LCSW

## 2014-12-06 NOTE — Progress Notes (Signed)
Adult Psychoeducational Group Note  Date:  12/06/2014 Time:  9:41 PM  Group Topic/Focus:  Wrap-Up Group:   The focus of this group is to help patients review their daily goal of treatment and discuss progress on daily workbooks.  Participation Level:  Active  Participation Quality:  Appropriate  Affect:  Appropriate  Cognitive:  Appropriate  Insight: Appropriate  Engagement in Group:  Engaged  Modes of Intervention:  Discussion  Additional Comments: The patient expressed that she attended group.The patient also said that life skills is a great group.  Octavio Manns 12/06/2014, 9:41 PM

## 2014-12-06 NOTE — Progress Notes (Signed)
Writer has observed patient up in the dayroom watching tv and interacting appropriately woth peers. She attended group and was compliant with her medications. She c/o constipation and received medication. She denies si/hi/a/v hallucinations. Support and encouragement given, safety maintained on unit with 15 min checks.

## 2014-12-06 NOTE — Progress Notes (Signed)
Patient ID: Tina [name], female   DOB: Apr 25, 1970, 44 y.o.   MRN: [id number]  Adult Psychoeducational Group Note  Date:  12/06/2014 Time:  10:00am  Group Topic/Focus:  Identifying Needs:   The focus of this group is to help patients identify their personal needs that have been historically problematic and identify healthy behaviors to address their needs.  Participation Level:  Active  Participation Quality:  Appropriate  Affect:  Appropriate  Cognitive:  Alert and Oriented  Insight: Appropriate  Engagement in Group:  Engaged  Modes of Intervention:  Activity, Discussion, Education and Support  Additional Comments: Pt able to identify and express appropriately what precipitated her admission to [facility] and her current energy level.    Aurora Mask 12/06/2014, 10:47 AM

## 2014-12-06 NOTE — [facility] Group Notes (Signed)
[facility] Group Notes:  (Nursing/MHT/Case Management/Adjunct)  Date:  12/06/2014  Time:  9:29 AM  Type of Therapy:  Psychoeducational Skills  Participation Level:  Active  Participation Quality:  Appropriate  Affect:  Appropriate  Cognitive:  Appropriate  Insight:  Appropriate  Engagement in Group:  Engaged  Modes of Intervention:  Discussion  Summary of Progress/Problems: Pt did attend self inventory group.    Tina [name], Tina [name] 12/06/2014, 9:29 AM 

## 2014-12-07 NOTE — [facility] Group Notes (Signed)
[facility] Group Notes:  (Nursing/MHT/Case Management/Adjunct)  Date:  12/07/2014  Time:  10:01 AM  Type of Therapy:  Psychoeducational Skills  Participation Level:  Did Not Attend  Participation Quality:  Did Not Attend  Affect:  Did Not Attend  Cognitive:  Did Not Attend  Insight:  None  Engagement in Group:  Did Not Attend  Modes of Intervention:  Did Not Attend  Summary of Progress/Problems: Pt did not attend patient self inventory group.   Jacquelyne Balint Shanta 12/07/2014, 10:01 AM

## 2014-12-07 NOTE — Progress Notes (Addendum)
Patient ID: Tina [name], female   DOB: 09/20/1970, 44 y.o.   MRN: [id number]   D: Pt has been very flat and depressed on the unit today. Pt did not attend any groups nor did she engage in any treatment. Pt reported that her depression was a 5, her hopelessness was a 4, and that her anxiety was a 0. Pt reported that her goal was to stay up and out of her room. Pt reported being negative SI/HI, no AH/VH noted. A: 15 min checks continued for patient safety. R: Pt safety maintained.

## 2014-12-07 NOTE — [facility] Group Notes (Signed)
[facility] LCSW Group Therapy  12/07/2014   1:15 PM   Type of Therapy:  Group Therapy  Participation Level:  Minimal  Participation Quality:  Minimal  Affect:  Appropriate, blunted, depressed  Cognitive:  Alert and Appropriate  Insight:  Limited, lacking   Engagement in Therapy:  Limited, lacking  Modes of Intervention:  Clarification, Confrontation, Discussion, Education, Exploration, Limit-setting, Orientation, Problem-solving, Rapport Building, Dance movement psychotherapist, Socialization and Support  Summary of Progress/Problems: The main focus of today's process group was to identify the patient's current support system and decide on other supports that can be put in place.  An emphasis was placed on using counselor, doctor, therapy groups, 12-step groups, and problem-specific support groups to expand supports, as well as doing something different than has been done before.  Pt states that her support system is "[employer]" as she can't rely on them and doesn't think they are always there.  Attempted to engage pt in discussion on how she can find support elsewhere but pt stared blankly and was hard to engage.  Pt appeared to actively listen to group discussion but minimally participated.    Chelsea Horton, LCSW 12/07/2014 2:30 pm

## 2014-12-07 NOTE — Progress Notes (Signed)
Adult Psychoeducational Group Note  Date:  12/07/2014 Time:  10:44 PM  Group Topic/Focus:  Wrap-Up Group:   The focus of this group is to help patients review their daily goal of treatment and discuss progress on daily workbooks.  Participation Level:  Active  Participation Quality:  Appropriate and Attentive  Affect:  Appropriate  Cognitive:  Appropriate  Insight: Appropriate and Good  Engagement in Group:  Engaged  Modes of Intervention:  Education  Additional Comments:  Pt healthy support system is her friend and mom. Pt mentioned her friend brought her here to get the proper help she need and supports her.   Merlinda Frederick 12/07/2014, 10:44 PM

## 2014-12-07 NOTE — Progress Notes (Signed)
Patient ID: Tina [name], female   DOB: 07-May-1970, 44 y.o.   MRN: [id number] [facility] MD Progress Note  12/07/2014 11:30 AM Tina [name]  MRN:  [id number]   Subjective:    She reports she is feeling better, although still depressed, and rates it a 5/10 up from 4/10. Denies medication side effects, although she just started the medication. Today she slept until 930 -10am today, and reports resting well last night with about 12 hours of sleep. Denies SI/HI/AVH.   Objective :  I have discussed case and have met with patient. She feels more depressed today, but unable to identify what is making her deperessed. Suspect this maybe due to medication taper and adjustments.  Although not back at baseline she feels okay. She denies any SI. Tolerating medications well, medication s/e has  Resolved(mental fogginess).  Going to groups, active in milieu, no disruptive or agitated behaviors on unit . Responsive to support, encouragement. BP has tended to be low - but not tachycardic or orthostatic at this time. Denies falls or dizziness.  Principal Problem: Bipolar I disorder, current or most recent episode depressed, in partial remission Diagnosis:   Patient Active Problem List   Diagnosis Date Noted  . Bipolar affective disorder, current episode severe [F31.9] 12/02/2014  . Bipolar I disorder, current or most recent episode depressed, in partial remission [F31.75] 07/11/2014  . Bipolar 1 disorder, mixed [F31.60] 06/27/2014  . Disease of thyroid gland [E07.9] 06/27/2014  . Anxiety [F41.9] 06/27/2014  . 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 Bipolar 1 disorder, depressed, partial remission [F31.75] 06/27/2014  . 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 Alcohol use disorder, severe, dependence [F10.20] 06/24/2014  . Bipolar disorder, unspecified [F31.9] 02/16/2013  . Adrenal insufficiency [E27.40]   . Acne [L70.9]   .  Cellulitis [L03.90]   . Hx of Lyme disease [Z86.19]   . Headache [R51]   . 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 Bipolar I disorder, most recent episode (or current) manic [F31.10] 08/29/2012  . Unspecified hypothyroidism [E03.9] 12/23/2010  . Myalgia [M79.1] 12/23/2010  . Fatigue [R53.83] 12/23/2010  . Unspecified vitamin deficiency [E56.9] 12/23/2010  . Depression [F32.9]    Total Time spent with patient: 20 minutes   Past Medical History:  Past Medical History  Diagnosis Date  . History of chicken pox   . Hypothyroidism   . Genital warts   . Depression   . Bipolar 2 disorder   . Adrenal insufficiency   . Acne   . Cellulitis   . Hx of Lyme disease   . Headache     Past Surgical History  Procedure Laterality Date  . Tonsillectomy    . Cesarean section  2003, 2007, 2011  . Umbilical hernia repair    . Inguinal hernia repair Bilateral    Family History:  Family History  Problem Relation Age of Onset  . Heart disease Other     Grandparent  . Alcohol abuse Paternal Grandmother    Social History:  History  Alcohol Use  . 7.2 - 10.8 oz/week  . 2-8 Cans of beer, 10 Shots of liquor per week    Comment: social drinker-few drinks a week      History  Drug Use No    Social History   Social History  . Marital Status: Married    Spouse Name: N/A  . Number of Children: N/A  . Years of Education: 22   Occupational  History  . HOMEMAKER    Social History Main Topics  . Smoking status: Current Every Day Smoker -- 1.50 packs/day for 29 years    Types: Cigarettes  . Smokeless tobacco: None  . Alcohol Use: 7.2 - 10.8 oz/week    2-8 Cans of beer, 10 Shots of liquor per week     Comment: social drinker-few drinks a week   . Drug Use: No  . Sexual Activity: Not Currently   Other Topics Concern  . None   Social History Narrative   Regular exercise-yes   Additional History:    Sleep:  Fair   Appetite:   Improving    Assessment:    Musculoskeletal: Strength & Muscle Tone: within normal limits Gait & Station: normal Patient leans: N/A   Psychiatric Specialty Exam: Physical Exam   ROS  no chest pain, no shortness of breath, no  Nausea ,  No vomiting, denies dizziness .  Blood pressure 83/52, pulse 77, temperature 98.5 F (36.9 C), temperature source Oral, resp. rate 14, height 5" (0.127 m), weight 45.36 kg (100 lb), SpO2 100 %.Body mass index is 2,812.33 kg/(m^2).  General Appearance: improved grooming   Eye Contact::  Good  Speech:  Normal Rate  Volume:  Normal  Mood: gradually improving mood , but still depressed   Affect:  Less constricted, more reactive  Thought Process:  Goal Directed and Linear  Orientation:  Full (Time, Place, and Person)  Thought Content:  denies hallucinations, no delusions , not internally preoccupied - ruminative about psychosocial stressors , mostly family stressors   Suicidal Thoughts:  No currently able to contract for safety on the unit, denies thoughts of hurting self or of SI .   Homicidal Thoughts:  No  Memory:  recent and remote grossly intact   Judgement:  Fair  Insight:  Present  Psychomotor Activity:  Normal  Concentration:  Good  Recall:  Good  Fund of Knowledge:Good  Language: Good  Akathisia:  Negative  Handed:  Right  AIMS (if indicated):     Assets:  Desire for Improvement Housing Resilience Vocational/Educational  ADL's:   Improved   Cognition: WNL  Sleep:  Number of Hours: 6.5     Current Medications: Current Facility-Administered Medications  Medication Dose Route Frequency Provider Last Rate Last Dose  . acetaminophen (TYLENOL) tablet 650 mg  650 mg Oral Q6H PRN Laverle Hobby, PA-C      . alum & mag hydroxide-simeth (MAALOX/MYLANTA) 200-200-20 MG/5ML suspension 30 mL  30 mL Oral Q4H PRN Laverle Hobby, PA-C      . bisacodyl (DULCOLAX) EC tablet 5 mg  5 mg Oral Daily PRN Harriet Butte, NP   5 mg at 12/05/14 2145  . feeding supplement  (ENSURE ENLIVE) (ENSURE ENLIVE) liquid 237 mL  237 mL Oral BID BM Myer Peer Cobos, MD   237 mL at 12/07/14 0944  . lamoTRIgine (LAMICTAL) tablet 200 mg  200 mg Oral QHS Laverle Hobby, PA-C   200 mg at 12/06/14 2205  . lurasidone (LATUDA) tablet 40 mg  40 mg Oral Q breakfast Jenne Campus, MD   40 mg at 12/07/14 0943  . magnesium hydroxide (MILK OF MAGNESIA) suspension 30 mL  30 mL Oral Daily PRN Laverle Hobby, PA-C   30 mL at 12/05/14 1325  . nicotine polacrilex (NICORETTE) gum 2 mg  2 mg Oral PRN Jenne Campus, MD   2 mg at 12/04/14 1320  . thyroid (ARMOUR) tablet 120  mg  120 mg Oral QAC breakfast Laverle Hobby, PA-C   120 mg at 12/07/14 7618  . topiramate (TOPAMAX) tablet 100 mg  100 mg Oral BID Laverle Hobby, PA-C   100 mg at 12/07/14 4859  . traZODone (DESYREL) tablet 100 mg  100 mg Oral QHS PRN Jenne Campus, MD   100 mg at 12/06/14 2205  . venlafaxine XR (EFFEXOR-XR) 24 hr capsule 75 mg  75 mg Oral Q breakfast Jenne Campus, MD   75 mg at 12/07/14 0944    Lab Results:  No results found for this or any previous visit (from the past 48 hour(s)).  Physical Findings: AIMS: Facial and Oral Movements Muscles of Facial Expression: None, normal Lips and Perioral Area: None, normal Jaw: None, normal Tongue: None, normal,Extremity Movements Upper (arms, wrists, hands, fingers): None, normal Lower (legs, knees, ankles, toes): None, normal, Trunk Movements Neck, shoulders, hips: None, normal, Overall Severity Severity of abnormal movements (highest score from questions above): None, normal Incapacitation due to abnormal movements: None, normal Patient's awareness of abnormal movements (rate only patient's report): No Awareness, Dental Status Current problems with teeth and/or dentures?: No Does patient usually wear dentures?: No  CIWA:  CIWA-Ar Total: 2 COWS:      Assessment -   Gradual improvement of mood and affect . Still depressed , but presenting with improved range  of affect, less tearful episodes, and more future oriented . She has had some vague side effects from her new medication regimen ( Latuda and Effexor XR) but these are subsiding. No SI at this time. Participative in milieu.    Treatment Plan Summary: Daily contact with patient to assess and evaluate symptoms and progress in treatment, Medication management, Plan inpatient admission and medications as below    Continue Topamax 100 mgr BID for migraine prophylaxis and for mood disorder  Continue  Latuda 40 mgrs QDAY for mood disorder Continue  Effexor XR 75 mgrs QDAY for depression  Continue Lamictal 200 mgrs QDAY for mood disorder  Encourage ongoing group, milieu participation to work on stressors, coping skills, enhance mood   Increase Trazodone to 100 mgrs QHS PRN for Insomnia . Continue Armour Thyroid for Hypothyroidism .  Treatment team working on disposition [place], [place] 12/07/2014, 11:30 AM  Reviewed the information documented and agree with the treatment plan.  JONNALAGADDA,JANARDHAHA R. 12/09/2014 10:10 AM

## 2014-12-07 NOTE — Progress Notes (Signed)
Writer has observed patient up in the dayroom watcihng other peers play cards. She has minimal interaction but eventually does play a few hands of card. Writer spoke with her 1:1 and she reports having had a good day and had a friend to visit her on this evening. She reports that she feels a little better than yesterday. She attended group this evening and participated. Support given and safety maintained on unit with 15 min checks.

## 2014-12-07 NOTE — [facility] Group Notes (Signed)
[facility] Group Notes:  (Nursing/MHT/Case Management/Adjunct)  Date:  12/07/2014  Time:  10:25 AM  Type of Therapy:  Psychoeducational Skills  Participation Level:  Active  Participation Quality:  Appropriate  Affect:  Appropriate  Cognitive:  Appropriate  Insight:  Appropriate  Engagement in Group:  Engaged  Modes of Intervention:  Discussion  Summary of Progress/Problems: Pt did attend healthy support systems group.   Tina [name], Tina [name] 12/07/2014, 10:25 AM 

## 2014-12-08 MED ORDER — VENLAFAXINE HCL ER 150 MG PO CP24
150.0000 mg | ORAL_CAPSULE | Freq: Every day | ORAL | Status: DC
  Administered 2014-12-09 – 2014-12-13 (×5): 150 mg via ORAL
  Filled 2014-12-08 (×8): qty 1

## 2014-12-08 NOTE — [facility] Group Notes (Signed)
[facility] LCSW Aftercare Discharge Planning Group Note  12/08/2014 8:45 AM  Pt did not attend, declined invitation.   Chad Cordial, LCSWA 12/08/2014 9:31 AM

## 2014-12-08 NOTE — [facility] Group Notes (Signed)
Adult Psychoeducational Group Note  Date:  12/08/2014 Time:  9:16 PM  Group Topic/Focus:  Wrap-Up Group:   The focus of this group is to help patients review their daily goal of treatment and discuss progress on daily workbooks.  Participation Level:  Minimal  Participation Quality:  Appropriate  Affect:  Flat  Cognitive:  Appropriate  Insight: Good  Engagement in Group:  Limited  Modes of Intervention:  Discussion  Additional Comments:  Patient stated her day was all right.  Patient stated she slept most of the day but went to one group.  Patient stated she had no emotion and was depressed.  Patient stated doctor was "uping" her medications tomorrow.  Caroll Rancher A 12/08/2014, 9:16 PM

## 2014-12-08 NOTE — Progress Notes (Signed)
Recreation Therapy Notes  Date: 09.26.2016 Time: 9:30am Location: [address] Group Room   Group Topic: Stress Management  Goal Area(s) Addresses:  Patient will actively participate in stress management techniques presented during session.   Behavioral Response: Did not attend.   Denise L Blanchfield, LRT/CTRS        Blanchfield, Denise L 12/08/2014 1:17 PM 

## 2014-12-08 NOTE — Progress Notes (Signed)
D:  Patient's self inventory sheet, patient has fair sleep, sleep medication is helpful.  Fair appetite, low energy level, poor concentration.  Rated depression 6, hopeless 5, denied anxiety.  Denied SI.  Denied physical problems.  Denied pain.  Goal is to feel physically better, feels very tired.  Plans to discharge home.  Can afford medications after discharge. A:  Medications administered per MD orders.  Emotional support and encouragement given patient. R:  Denied SI and HI, contracts for safety.  Denied A/V hallucinations.  Denied pain.  Safety maintained with 15 minute checks.

## 2014-12-08 NOTE — Progress Notes (Signed)
Patient ID: EMMALOU HUNGER, female   DOB: [birth year], 44 y.o.   MRN: [id number] D: Client visible in th dayroom, interacts appropriately, reports of the day "fine", depression "6" of 10, notes that she sleeps well with the Trazodone. Reports she is "adjusting to new medications" Client finds groups "good" "I like the Lifestyle group, talking about our needs" Client reports one need "love" A: Writer provided emotional support, encouraged client to report if any SE with medications. Staff will monitor q12min for safety. R: Client is safe on the unit, attended group.

## 2014-12-08 NOTE — [facility] Group Notes (Signed)
[facility] LCSW Group Therapy  12/08/2014 1:15pm  Type of Therapy:  Group Therapy vercoming Obstacles  Participation Level:  Minimal  Participation Quality:  N/A  Affect:  Flat  Cognitive:  Appropriate and Oriented  Insight:  UTA  Engagement in Therapy:  Limited  Modes of Intervention:  Discussion, Exploration, Problem-solving and Support  Description of Group:   In this group patients will be encouraged to explore what they see as obstacles to their own wellness and recovery. They will be guided to discuss their thoughts, feelings, and behaviors related to these obstacles. The group will process together ways to cope with barriers, with attention given to specific choices patients can make. Each patient will be challenged to identify changes they are motivated to make in order to overcome their obstacles. This group will be process-oriented, with patients participating in exploration of their own experiences as well as giving and receiving support and challenge from other group members.  Summary of Patient Progress: Pt continues to be disengaged in group discussion and makes minimal eye contact with peers and facilitator.   Therapeutic Modalities:   Cognitive Behavioral Therapy Solution Focused Therapy Motivational Interviewing Relapse Prevention Therapy   Chad Cordial, LCSWA 12/08/2014 4:53 PM

## 2014-12-08 NOTE — Plan of Care (Signed)
Problem: Diagnosis: Increased Risk For Suicide Attempt Goal: STG-Patient Will Comply With Medication Regime Outcome: Progressing Patient is compliant with her scheduled hs medications.      

## 2014-12-08 NOTE — Progress Notes (Signed)
Patient ID: Tina [name], female   DOB: 15-Jun-1970, 44 y.o.   MRN: [id number] [facility] MD Progress Note  12/08/2014 4:50 PM SERRIA SLOMA  MRN:  [id number] Subjective:  Pt states: "I'm doing worse overall. I'm more depressed and I can't seem to shake it."  Objective: Pt seen and chart reviewed. Pt is alert/oriented x4, calm, cooperative, and appropriate. However, pt is very passive in regard to her treatment and reports that she has no idea how to use coping skills. Pt does report that she has been in groups and listened to others but that she struggles to apply it to other parts of her life. Pt does clearly deny suicidal/homicidal ideation and psychosis and does not appear to be responding to internal stimuli. She reports a desire to adjust her medications to help her (done below).   Principal Problem: Bipolar I disorder, current or most recent episode depressed, in partial remission Diagnosis:   Patient Active Problem List   Diagnosis Date Noted  . Bipolar affective disorder, current episode severe [F31.9] 12/02/2014  . Bipolar I disorder, current or most recent episode depressed, in partial remission [F31.75] 07/11/2014  . Bipolar 1 disorder, mixed [F31.60] 06/27/2014  . Disease of thyroid gland [E07.9] 06/27/2014  . Anxiety [F41.9] 06/27/2014  . 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 Bipolar 1 disorder, depressed, partial remission [F31.75] 06/27/2014  . 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 Alcohol use disorder, severe, dependence [F10.20] 06/24/2014  . Bipolar disorder, unspecified [F31.9] 02/16/2013  . Adrenal insufficiency [E27.40]   . Acne [L70.9]   . Cellulitis [L03.90]   . Hx of Lyme disease [Z86.19]   . Headache [R51]   . 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 Bipolar I disorder, most recent episode (or current) manic [F31.10] 08/29/2012  . Unspecified hypothyroidism [E03.9] 12/23/2010  .  Myalgia [M79.1] 12/23/2010  . Fatigue [R53.83] 12/23/2010  . Unspecified vitamin deficiency [E56.9] 12/23/2010  . Depression [F32.9]    Total Time spent with patient: 25 minutes  Past Medical History:  Past Medical History  Diagnosis Date  . History of chicken pox   . Hypothyroidism   . Genital warts   . Depression   . Bipolar 2 disorder   . Adrenal insufficiency   . Acne   . Cellulitis   . Hx of Lyme disease   . Headache     Past Surgical History  Procedure Laterality Date  . Tonsillectomy    . Cesarean section  2003, 2007, 2011  . Umbilical hernia repair    . Inguinal hernia repair Bilateral    Family History:  Family History  Problem Relation Age of Onset  . Heart disease Other     Grandparent  . Alcohol abuse Paternal Grandmother    Social History:  History  Alcohol Use  . 7.2 - 10.8 oz/week  . 2-8 Cans of beer, 10 Shots of liquor per week    Comment: social drinker-few drinks a week      History  Drug Use No    Social History   Social History  . Marital Status: Married    Spouse Name: N/A  . Number of Children: N/A  . Years of Education: 16   Occupational History  . HOMEMAKER    Social History Main Topics  . Smoking status: Current Every Day Smoker -- 1.50 packs/day for 29 years    Types: Cigarettes  . Smokeless tobacco: None  . Alcohol Use:  7.2 - 10.8 oz/week    2-8 Cans of beer, 10 Shots of liquor per week     Comment: social drinker-few drinks a week   . Drug Use: No  . Sexual Activity: Not Currently   Other Topics Concern  . None   Social History Narrative   Regular exercise-yes   Additional History:    Sleep: Fair   Appetite: Fair Improving   Assessment: See above  Musculoskeletal: Strength & Muscle Tone: within normal limits Gait & Station: normal Patient leans: N/A  Psychiatric Specialty Exam: Physical Exam  Review of Systems  Psychiatric/Behavioral: Positive for depression. Negative for suicidal ideas and  hallucinations. The patient is nervous/anxious.   All other systems reviewed and are negative.  no chest pain, no shortness of breath, no  Nausea ,  No vomiting, denies dizziness .  Blood pressure 91/54, pulse 92, temperature 98.1 F (36.7 C), temperature source Oral, resp. rate 14, height 5" (0.127 m), weight 45.36 kg (100 lb), SpO2 100 %.Body mass index is 2,812.33 kg/(m^2).  General Appearance: Casual and Fairly Groomed  Eye Contact::  Good  Speech:  Normal Rate  Volume:  Normal  Mood: gradually improving mood , but still depressed   Affect:  Less constricted, more reactive  Thought Process:  Goal Directed and Linear  Orientation:  Full (Time, Place, and Person)  Thought Content:  denies hallucinations, no delusions , not internally preoccupied  ruminative about psychosocial stressors , mostly family stressors   Suicidal Thoughts:  No currently able to contract for safety on the unit, denies thoughts of hurting self or of SI .   Homicidal Thoughts:  No  Memory:  recent and remote grossly intact   Judgement:  Fair  Insight:  Present  Psychomotor Activity:  Normal  Concentration:  Good  Recall:  Good  Fund of Knowledge:Good  Language: Good  Akathisia:  Negative  Handed:  Right  AIMS (if indicated):     Assets:  Desire for Improvement Housing Resilience Vocational/Educational  ADL's:   Improved   Cognition: WNL  Sleep:  Number of Hours: 6.25     Current Medications: Current Facility-Administered Medications  Medication Dose Route Frequency Rashena Dowling Last Rate Last Dose  . acetaminophen (TYLENOL) tablet 650 mg  650 mg Oral Q6H PRN Kerry Hough, PA-C      . alum & mag hydroxide-simeth (MAALOX/MYLANTA) 200-200-20 MG/5ML suspension 30 mL  30 mL Oral Q4H PRN Kerry Hough, PA-C      . bisacodyl (DULCOLAX) EC tablet 5 mg  5 mg Oral Daily PRN Worthy Flank, NP   5 mg at 12/05/14 2145  . feeding supplement (ENSURE ENLIVE) (ENSURE ENLIVE) liquid 237 mL  237 mL Oral BID BM  Rockey Situ Cobos, MD   237 mL at 12/08/14 1453  . lamoTRIgine (LAMICTAL) tablet 200 mg  200 mg Oral QHS Kerry Hough, PA-C   200 mg at 12/07/14 2201  . lurasidone (LATUDA) tablet 40 mg  40 mg Oral Q breakfast Craige Cotta, MD   40 mg at 12/08/14 0850  . magnesium hydroxide (MILK OF MAGNESIA) suspension 30 mL  30 mL Oral Daily PRN Kerry Hough, PA-C   30 mL at 12/05/14 1325  . nicotine polacrilex (NICORETTE) gum 2 mg  2 mg Oral PRN Craige Cotta, MD   2 mg at 12/04/14 1320  . thyroid (ARMOUR) tablet 120 mg  120 mg Oral QAC breakfast Kerry Hough, PA-C   120 mg at  12/08/14 0631  . topiramate (TOPAMAX) tablet 100 mg  100 mg Oral BID Kerry Hough, PA-C   100 mg at 12/08/14 0851  . traZODone (DESYREL) tablet 100 mg  100 mg Oral QHS PRN Craige Cotta, MD   100 mg at 12/07/14 2201  . venlafaxine XR (EFFEXOR-XR) 24 hr capsule 75 mg  75 mg Oral Q breakfast Craige Cotta, MD   75 mg at 12/08/14 5409    Lab Results:  No results found for this or any previous visit (from the past 48 hour(s)).  Physical Findings: AIMS: Facial and Oral Movements Muscles of Facial Expression: None, normal Lips and Perioral Area: None, normal Jaw: None, normal Tongue: None, normal,Extremity Movements Upper (arms, wrists, hands, fingers): None, normal Lower (legs, knees, ankles, toes): None, normal, Trunk Movements Neck, shoulders, hips: None, normal, Overall Severity Severity of abnormal movements (highest score from questions above): None, normal Incapacitation due to abnormal movements: None, normal Patient's awareness of abnormal movements (rate only patient's report): No Awareness, Dental Status Current problems with teeth and/or dentures?: No Does patient usually wear dentures?: No  CIWA:  CIWA-Ar Total: 1 COWS:  COWS Total Score: 1    Treatment Plan Summary: Daily contact with patient to assess and evaluate symptoms and progress in treatment, Medication management, Plan inpatient  admission and medications as below  Continue Topamax 100 mgr BID for migraine prophylaxis and for mood disorder  Continue  Latuda 40 mgrs QDAY for mood disorder *Increase Effexor XR to 150 mgrs QDAY for depression  Continue Lamictal 200 mgrs QDAY for mood disorder  Encourage ongoing group, milieu participation to work on stressors, coping skills, enhance mood   Increase Trazodone to 100 mgrs QHS PRN for Insomnia . Continue Armour Thyroid for Hypothyroidism.  Treatment team working on disposition planning   Beau Fanny, FNP-BC 12/08/2014, 4:50 PM  Agree with NP  Progress Note, as above   Nehemiah Massed, MD

## 2014-12-08 NOTE — Progress Notes (Addendum)
Writer spoke with patient 1:1 and she reports that she has felt more depressed today and has made it a point to try and stay in the dayroom more this evening. She reports that she feels more depressed b/c of her medication changes. Support givnen and encouraged her to speak with her doctor concerning this issue. Safety maintained on unit with 15 min checks.

## 2014-12-09 DIAGNOSIS — F314 Bipolar disorder, current episode depressed, severe, without psychotic features: Principal | ICD-10-CM

## 2014-12-09 MED ORDER — LURASIDONE HCL 40 MG PO TABS
60.0000 mg | ORAL_TABLET | Freq: Every day | ORAL | Status: DC
Start: 2014-12-10 — End: 2014-12-10
  Administered 2014-12-10: 60 mg via ORAL
  Filled 2014-12-09 (×2): qty 2

## 2014-12-09 NOTE — [facility] Suicide Risk Assessment (Signed)
[facility] INPATIENT:  Family/Significant Other Suicide Prevention Education  Suicide Prevention Education:  Education Completed; Genelle Gather, Pt's mother [phone], has been identified by the patient as the family member/significant other with whom the patient will be residing, and identified as the person(s) who will aid the patient in the event of a mental health crisis (suicidal ideations/suicide attempt).  With written consent from the patient, the family member/significant other has been provided the following suicide prevention education, prior to the and/or following the discharge of the patient.  The suicide prevention education provided includes the following:  Suicide risk factors  Suicide prevention and interventions  National Suicide Hotline telephone number  [facility] assessment telephone number  [facility] Emergency Assistance 911  [facility] and/or Residential Mobile Crisis Unit telephone number  Request made of family/significant other to:  Remove weapons (e.g., guns, rifles, knives), all items previously/currently identified as safety concern.    Remove drugs/medications (over-the-counter, prescriptions, illicit drugs), all items previously/currently identified as a safety concern.  The family member/significant other verbalizes understanding of the suicide prevention education information provided.  The family member/significant other agrees to remove the items of safety concern listed above.  Elaina Hoops 12/09/2014, 5:23 PM

## 2014-12-09 NOTE — [facility] Group Notes (Signed)
[facility] LCSW Group Therapy 12/09/2014 1:15 PM  Type of Therapy: Group Therapy- Feelings about Diagnosis  Pt did not attend, declined invitation.   Chad Cordial, LCSWA 12/09/2014 4:58 PM

## 2014-12-09 NOTE — Progress Notes (Addendum)
D:  Patient denied SI and HI this morning, contracts for safety.  Denied A/V hallucinations.  Denied pain. A:  Medications administered per MD orders.  Patient refused ensure this afternoon.  Emotional support and encouragement given patient. R:  Safety maintained with 15 minute checks.

## 2014-12-09 NOTE — Progress Notes (Signed)
Patient ID: Tina [name], female   DOB: [birth year], 44 y.o.   MRN: [id number] D: Client in dayroom interacting with peers, reports depression as "6" of 10, denies anxiety. "I guess it's getting used to the change of medications." Client reports no BM in "couple of day" A: Writer encouraged fluids and nutritional supplements, due to appetite.  Administered milk of magnesium 30 cc po. Staff will monitor q29min for safety. R: Client is safe on the unit, attended group.

## 2014-12-09 NOTE — Plan of Care (Signed)
Problem: Alteration in mood Goal: STG-Patient is able to discuss feelings and issues (Patient is able to discuss feelings and issues leading to depression)  Outcome: Progressing Client discuss feelings AEB reporting "adjusting to new medications" depression "6" of 10. "[employer] group, we were talking about out needs", expressed one need "Love"

## 2014-12-09 NOTE — Progress Notes (Signed)
Patient ID: Tina [name], female   DOB: July 09, 1970, 44 y.o.   MRN: [id number] [facility] MD Progress Note  12/09/2014 9:13 AM ZITLALI PRIMM  MRN:  [id number] Subjective:   She reports ongoing depression, although does feel there has been some degree of improvement compared to admission. She denies medication side effects. She reports low energy, a tendency to isolate , and a pervasive sense of sadness.  She denies any active SI.    Objective: Pt seen and chart reviewed.  Case discussed with treatment team. Patient  Visible on unit, going to groups, but presenting with depressed mood, subdued affect . Affect is reactive with support, empathy, and she does smile appropriately at times . She describes her depression at about 6/10, she denies suicidal ideations, and contracts for safety on unit. At this time denies medication side effects, and is tolerating medications well - these include Latuda, Effexor XR, both of which are new medications for her .  She is not presenting with akathisia , no abnormal involuntary movements noted. Some neuro- vegetative symptoms have improved,- she is sleeping better, and appetite has improved, she has gained about 8 lbs since admission to unit . She continues to focus on psychosocial stressors such as being divorced and loneliness . We discussed medication options- she wants to continue current trial, as she does feel some degree of improvement and has not had side effects. Of note she has been reluctant to try any medications  That may likely be associated with weight gain as side effect.   Principal Problem: Bipolar I disorder, current or most recent episode depressed, in partial remission Diagnosis:   Patient Active Problem List   Diagnosis Date Noted  . Bipolar affective disorder, current episode severe [F31.9] 12/02/2014  . Bipolar I disorder, current or most recent episode depressed, in partial remission [F31.75] 07/11/2014  . Bipolar 1 disorder, mixed  [F31.60] 06/27/2014  . Disease of thyroid gland [E07.9] 06/27/2014  . Anxiety [F41.9] 06/27/2014  . 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 Bipolar 1 disorder, depressed, partial remission [F31.75] 06/27/2014  . 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 Alcohol use disorder, severe, dependence [F10.20] 06/24/2014  . Bipolar disorder, unspecified [F31.9] 02/16/2013  . Adrenal insufficiency [E27.40]   . Acne [L70.9]   . Cellulitis [L03.90]   . Hx of Lyme disease [Z86.19]   . Headache [R51]   . 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 Bipolar I disorder, most recent episode (or current) manic [F31.10] 08/29/2012  . Unspecified hypothyroidism [E03.9] 12/23/2010  . Myalgia [M79.1] 12/23/2010  . Fatigue [R53.83] 12/23/2010  . Unspecified vitamin deficiency [E56.9] 12/23/2010  . Depression [F32.9]    Total Time spent with patient: 25 minutes  Past Medical History:  Past Medical History  Diagnosis Date  . History of chicken pox   . Hypothyroidism   . Genital warts   . Depression   . Bipolar 2 disorder   . Adrenal insufficiency   . Acne   . Cellulitis   . Hx of Lyme disease   . Headache     Past Surgical History  Procedure Laterality Date  . Tonsillectomy    . Cesarean section  2003, 2007, 2011  . Umbilical hernia repair    . Inguinal hernia repair Bilateral    Family History:  Family History  Problem Relation Age of Onset  . Heart disease Other     Grandparent  . Alcohol abuse Paternal Grandmother  Social History:  History  Alcohol Use  . 7.2 - 10.8 oz/week  . 2-8 Cans of beer, 10 Shots of liquor per week    Comment: social drinker-few drinks a week      History  Drug Use No    Social History   Social History  . Marital Status: Married    Spouse Name: N/A  . Number of Children: N/A  . Years of Education: 16   Occupational History  . HOMEMAKER    Social History Main Topics   . Smoking status: Current Every Day Smoker -- 1.50 packs/day for 29 years    Types: Cigarettes  . Smokeless tobacco: None  . Alcohol Use: 7.2 - 10.8 oz/week    2-8 Cans of beer, 10 Shots of liquor per week     Comment: social drinker-few drinks a week   . Drug Use: No  . Sexual Activity: Not Currently   Other Topics Concern  . None   Social History Narrative   Regular exercise-yes   Additional History:    Sleep: improving   Appetite: mproving     Musculoskeletal: Strength & Muscle Tone: within normal limits Gait & Station: normal Patient leans: N/A  Psychiatric Specialty Exam: Physical Exam  Review of Systems  Psychiatric/Behavioral: Positive for depression. Negative for suicidal ideas and hallucinations. The patient is nervous/anxious.   All other systems reviewed and are negative.  no chest pain, no shortness of breath, no  Nausea ,  No vomiting, denies dizziness .  Blood pressure 88/53, pulse 76, temperature 98 F (36.7 C), temperature source Oral, resp. rate 16, height 5" (0.127 m), weight 109 lb (49.442 kg), SpO2 100 %.Body mass index is 3,065.41 kg/(m^2).  General Appearance: improved grooming   Eye Contact::  Good  Speech:  Normal Rate  Volume:  Normal  Mood:  Depressed, but states that there has been some improvement since admission  Affect:  Less constricted, more reactive  Thought Process:  Goal Directed and Linear  Orientation:  Full (Time, Place, and Person)  Thought Content:  denies hallucinations, no delusions , not internally preoccupied  ruminative about psychosocial stressors , mostly family stressors   Suicidal Thoughts:  No currently able to contract for safety on the unit, denies thoughts of hurting self or of SI .   Homicidal Thoughts:  No  Memory:  recent and remote grossly intact   Judgement:  Fair  Insight:  Present  Psychomotor Activity:  Normal  Concentration:  Good  Recall:  Good  Fund of Knowledge:Good  Language: Good  Akathisia:   Negative  Handed:  Right  AIMS (if indicated):     Assets:  Desire for Improvement Housing Resilience Vocational/Educational  ADL's:   Improved   Cognition: WNL  Sleep:  Number of Hours: 6.25     Current Medications: Current Facility-Administered Medications  Medication Dose Route Frequency Provider Last Rate Last Dose  . acetaminophen (TYLENOL) tablet 650 mg  650 mg Oral Q6H PRN Kerry Hough, PA-C      . alum & mag hydroxide-simeth (MAALOX/MYLANTA) 200-200-20 MG/5ML suspension 30 mL  30 mL Oral Q4H PRN Kerry Hough, PA-C      . bisacodyl (DULCOLAX) EC tablet 5 mg  5 mg Oral Daily PRN Worthy Flank, NP   5 mg at 12/05/14 2145  . feeding supplement (ENSURE ENLIVE) (ENSURE ENLIVE) liquid 237 mL  237 mL Oral BID BM Rockey Situ Cobos, MD   237 mL at 12/08/14 1453  .  lamoTRIgine (LAMICTAL) tablet 200 mg  200 mg Oral QHS Kerry Hough, PA-C   200 mg at 12/08/14 2205  . [START ON 12/10/2014] lurasidone (LATUDA) tablet 60 mg  60 mg Oral Q breakfast Rockey Situ Cobos, MD      . magnesium hydroxide (MILK OF MAGNESIA) suspension 30 mL  30 mL Oral Daily PRN Kerry Hough, PA-C   30 mL at 12/05/14 1325  . nicotine polacrilex (NICORETTE) gum 2 mg  2 mg Oral PRN Craige Cotta, MD   2 mg at 12/04/14 1320  . thyroid (ARMOUR) tablet 120 mg  120 mg Oral QAC breakfast Kerry Hough, PA-C   120 mg at 12/09/14 0981  . topiramate (TOPAMAX) tablet 100 mg  100 mg Oral BID Kerry Hough, PA-C   100 mg at 12/09/14 0849  . traZODone (DESYREL) tablet 100 mg  100 mg Oral QHS PRN Craige Cotta, MD   100 mg at 12/08/14 2205  . venlafaxine XR (EFFEXOR-XR) 24 hr capsule 150 mg  150 mg Oral Q breakfast Craige Cotta, MD   150 mg at 12/09/14 1914    Lab Results:  No results found for this or any previous visit (from the past 48 hour(s)).  Physical Findings: AIMS: Facial and Oral Movements Muscles of Facial Expression: None, normal Lips and Perioral Area: None, normal Jaw: None, normal Tongue:  None, normal,Extremity Movements Upper (arms, wrists, hands, fingers): None, normal Lower (legs, knees, ankles, toes): None, normal, Trunk Movements Neck, shoulders, hips: None, normal, Overall Severity Severity of abnormal movements (highest score from questions above): None, normal Incapacitation due to abnormal movements: None, normal Patient's awareness of abnormal movements (rate only patient's report): No Awareness, Dental Status Current problems with teeth and/or dentures?: No Does patient usually wear dentures?: No  CIWA:  CIWA-Ar Total: 1 COWS:  COWS Total Score: 1   Assessment - patient continues to present with and report depression, sadness, although there has been partial improvement compared to admission. Denies any suicidal ideations and contracts for safety and there are  No psychotic symptoms. She does continue to ruminate about her psychosocial stressors. She is tolerating Effexor XR and Latuda trials well thus far, without reported or noted side effects. She is also on Lamictal and Topamax, which she has been on for  A long period of time, without side effects.    Treatment Plan Summary: Daily contact with patient to assess and evaluate symptoms and progress in treatment, Medication management, Plan inpatient admission and medications as below  Continue Topamax 100 mgr BID for migraine prophylaxis and for mood disorder  Increase   Latuda to 60 mgrs QDAY for mood disorder Increase Effexor XR to 150 mgrs QDAY for depression  Continue Lamictal 200 mgrs QDAY for mood disorder  Encourage ongoing group, milieu participation to work on stressors, coping skills, enhance mood   Continue  Trazodone to 100 mgrs QHS PRN for Insomnia . Continue Armour Thyroid for Hypothyroidism.  Treatment team working on disposition planning - I have encouraged patient to consider IOP level of care after discharge due to persistence of symptoms, but she states she cannot do this level of care at  present , as she  Needs to return to work after discharge.   Nehemiah Massed, MD 12/09/2014, 9:13 AM

## 2014-12-10 LAB — BASIC METABOLIC PANEL
Anion gap: 4 — ABNORMAL LOW (ref 5–15)
BUN: 15 mg/dL (ref 6–20)
CHLORIDE: 108 mmol/L (ref 101–111)
CO2: 25 mmol/L (ref 22–32)
Calcium: 9.2 mg/dL (ref 8.9–10.3)
Creatinine, Ser: 0.55 mg/dL (ref 0.44–1.00)
GFR calc Af Amer: >60 mL/min (ref >60)
GFR calc non Af Amer: >60 mL/min (ref >60)
Glucose, Bld: 95 mg/dL (ref 65–99)
POTASSIUM: 4.5 mmol/L (ref 3.5–5.1)
SODIUM: 137 mmol/L (ref 135–145)

## 2014-12-10 MED ORDER — LITHIUM CARBONATE ER 300 MG PO TBCR
300.0000 mg | EXTENDED_RELEASE_TABLET | Freq: Every day | ORAL | Status: DC
  Administered 2014-12-11: 300 mg via ORAL
  Filled 2014-12-10 (×4): qty 1

## 2014-12-10 NOTE — Progress Notes (Signed)
D:  Patient denied SI and HI, contracts for safety.  Denied A/V hallucinations.  Denied pain. A:  Medications administered per MD orders.  Emotional support and encouragement. R:  Safety maintained with 15 minute checks.  

## 2014-12-10 NOTE — [facility] Group Notes (Signed)
[facility] LCSW Group Therapy 12/10/2014 1:15 PM  Type of Therapy: Group Therapy- Emotion Regulation  Pt did not attend, declined invitation.    Chad Cordial, LCSWA 12/10/2014 5:07 PM

## 2014-12-10 NOTE — Progress Notes (Signed)
D: Patient in the dayroom on approach.  Patient states her day was ok but states, "I am still depressed.  Patient expressed she is still working with the doctor to get her medications adjusted.  Patient denies SI/HI and denies AVH.  Patient states her goal for today was not to be as depressed and patient states she did not meet her goal. A: Staff to monitor Q 15 mins for safety.  Encouragement and support offered.  Scheduled medications administered per orders. R: Patient remains safe on the unit.  Patient attended group tonight.  Patient visible on the unit and interacting with peers.  Patient taking administered medications.

## 2014-12-10 NOTE — Progress Notes (Addendum)
Patient ID: Tina [name], female   DOB: 02-Dec-1970, 43 y.o.   MRN: [id number] [facility] MD Progress Note  12/10/2014 11:21 AM Tina [name]  MRN:  [id number] Subjective:   Patient continues to report depression/ sadness and neuro-vegetative symptoms of depression such as low energy, anhedonia. She states she is not suicidal , contracts for safety on the unit. She denies medication side effects.  We have discussed adding or switching antidepressant/ mood disorder medications to address persistent/ significant depression. Of note, she wants to continue current medications and " not give up on them yet- I know they take time to kick in".   Objective: Pt seen and chart reviewed.  Case discussed with treatment team. As discussed with staff patient presents depressed, sad, and has tended to isolate in her room more .  Patient describes, as above, ongoing depression, low energy level , sense of sadness, but denies any suicidal ideations, identifies her children as protective factor from hurting self. She does present with a subdued , constricted affect, depressed mood, and soft speech. She smiles at times appropriately. We have reviewed medication/ treatment options . As noted, both Latuda and Effexor XR doses were recently increased, and she has denied side effects thus far. She is knowledgeable about psychiatric medications and states she prefers to continue current medication regimen rather than switching , as she states doses were just increased and she remains hopeful that the medications will start working / " kick in" soon. She agrees to " make an effort " to minimize isolation, and to start going to more groups, participating more in milieu. Partial improvement of affect with support, encouragement, empathy noted .    Principal Problem: Bipolar I disorder, current or most recent episode depressed, in partial remission Diagnosis:   Patient Active Problem List   Diagnosis Date Noted  . Bipolar  affective disorder, current episode severe [F31.9] 12/02/2014  . Bipolar I disorder, current or most recent episode depressed, in partial remission [F31.75] 07/11/2014  . Bipolar 1 disorder, mixed [F31.60] 06/27/2014  . Disease of thyroid gland [E07.9] 06/27/2014  . Anxiety [F41.9] 06/27/2014  . 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 Bipolar 1 disorder, depressed, partial remission [F31.75] 06/27/2014  . 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 Alcohol use disorder, severe, dependence [F10.20] 06/24/2014  . Bipolar disorder, unspecified [F31.9] 02/16/2013  . Adrenal insufficiency [E27.40]   . Acne [L70.9]   . Cellulitis [L03.90]   . Hx of Lyme disease [Z86.19]   . Headache [R51]   . 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 Bipolar I disorder, most recent episode (or current) manic [F31.10] 08/29/2012  . Unspecified hypothyroidism [E03.9] 12/23/2010  . Myalgia [M79.1] 12/23/2010  . Fatigue [R53.83] 12/23/2010  . Unspecified vitamin deficiency [E56.9] 12/23/2010  . Depression [F32.9]    Total Time spent with patient: 25 minutes  Past Medical History:  Past Medical History  Diagnosis Date  . History of chicken pox   . Hypothyroidism   . Genital warts   . Depression   . Bipolar 2 disorder   . Adrenal insufficiency   . Acne   . Cellulitis   . Hx of Lyme disease   . Headache     Past Surgical History  Procedure Laterality Date  . Tonsillectomy    . Cesarean section  2003, 2007, 2011  . Umbilical hernia repair    . Inguinal hernia repair Bilateral    Family History:  Family  History  Problem Relation Age of Onset  . Heart disease Other     Grandparent  . Alcohol abuse Paternal Grandmother    Social History:  History  Alcohol Use  . 7.2 - 10.8 oz/week  . 2-8 Cans of beer, 10 Shots of liquor per week    Comment: social drinker-few drinks a week      History  Drug Use No    Social  History   Social History  . Marital Status: Married    Spouse Name: N/A  . Number of Children: N/A  . Years of Education: 16   Occupational History  . HOMEMAKER    Social History Main Topics  . Smoking status: Current Every Day Smoker -- 1.50 packs/day for 29 years    Types: Cigarettes  . Smokeless tobacco: None  . Alcohol Use: 7.2 - 10.8 oz/week    2-8 Cans of beer, 10 Shots of liquor per week     Comment: social drinker-few drinks a week   . Drug Use: No  . Sexual Activity: Not Currently   Other Topics Concern  . None   Social History Narrative   Regular exercise-yes   Additional History:    Sleep: good   Appetite: improving - weight has increased and she has gained 9 lbs since admission- current BMI 21.5    Musculoskeletal: Strength & Muscle Tone: within normal limits Gait & Station: normal Patient leans: N/A  Psychiatric Specialty Exam: Physical Exam  Review of Systems  Psychiatric/Behavioral: Positive for depression. Negative for suicidal ideas and hallucinations. The patient is nervous/anxious.   All other systems reviewed and are negative.  no chest pain, no shortness of breath, no  Nausea ,  No vomiting, denies dizziness .  Blood pressure 79/58, pulse 100, temperature 98.2 F (36.8 C), temperature source Oral, resp. rate 16, height 5" (0.127 m), weight 109 lb (49.442 kg), SpO2 100 %.Body mass index is 3,065.41 kg/(m^2).  General Appearance: improved grooming   Eye Contact::  Good  Speech:  Normal Rate  Volume:  Normal  Mood:  Depressed  Affect:  constricted,   Does smile reactive at times   Thought Process:  Goal Directed and Linear  Orientation:  Full (Time, Place, and Person)  Thought Content:  denies hallucinations, no delusions , not internally preoccupied  remains sad and ruminative about psychosocial stressors , mostly family stressors   Suicidal Thoughts:  No currently able to contract for safety on the unit, denies any  thoughts of hurting self  or of SI .   Homicidal Thoughts:  No  Memory:  recent and remote grossly intact   Judgement:  Fair  Insight:  Present  Psychomotor Activity:  Normal  Concentration:  Good  Recall:  Good  Fund of Knowledge:Good  Language: Good  Akathisia:  Negative  Handed:  Right  AIMS (if indicated):     Assets:  Desire for Improvement Housing Resilience Vocational/Educational  ADL's:   Improved   Cognition: WNL  Sleep:  Number of Hours: 6.5     Current Medications: Current Facility-Administered Medications  Medication Dose Route Frequency Provider Last Rate Last Dose  . acetaminophen (TYLENOL) tablet 650 mg  650 mg Oral Q6H PRN Kerry Hough, PA-C      . alum & mag hydroxide-simeth (MAALOX/MYLANTA) 200-200-20 MG/5ML suspension 30 mL  30 mL Oral Q4H PRN Kerry Hough, PA-C      . bisacodyl (DULCOLAX) EC tablet 5 mg  5 mg Oral Daily PRN Christen Bame  Renelda Loma, NP   5 mg at 12/05/14 2145  . feeding supplement (ENSURE ENLIVE) (ENSURE ENLIVE) liquid 237 mL  237 mL Oral BID BM Craige Cotta, MD   237 mL at 12/10/14 0824  . lamoTRIgine (LAMICTAL) tablet 200 mg  200 mg Oral QHS Kerry Hough, PA-C   200 mg at 12/09/14 2148  . lurasidone (LATUDA) tablet 60 mg  60 mg Oral Q breakfast Craige Cotta, MD   60 mg at 12/10/14 0823  . magnesium hydroxide (MILK OF MAGNESIA) suspension 30 mL  30 mL Oral Daily PRN Kerry Hough, PA-C   30 mL at 12/09/14 2032  . nicotine polacrilex (NICORETTE) gum 2 mg  2 mg Oral PRN Craige Cotta, MD   2 mg at 12/04/14 1320  . thyroid (ARMOUR) tablet 120 mg  120 mg Oral QAC breakfast Kerry Hough, PA-C   120 mg at 12/10/14 2440  . topiramate (TOPAMAX) tablet 100 mg  100 mg Oral BID Kerry Hough, PA-C   100 mg at 12/10/14 1027  . traZODone (DESYREL) tablet 100 mg  100 mg Oral QHS PRN Craige Cotta, MD   100 mg at 12/09/14 2147  . venlafaxine XR (EFFEXOR-XR) 24 hr capsule 150 mg  150 mg Oral Q breakfast Craige Cotta, MD   150 mg at 12/10/14 2536    Lab  Results:  No results found for this or any previous visit (from the past 48 hour(s)).  Physical Findings: AIMS: Facial and Oral Movements Muscles of Facial Expression: None, normal Lips and Perioral Area: None, normal Jaw: None, normal Tongue: None, normal,Extremity Movements Upper (arms, wrists, hands, fingers): None, normal Lower (legs, knees, ankles, toes): None, normal, Trunk Movements Neck, shoulders, hips: None, normal, Overall Severity Severity of abnormal movements (highest score from questions above): None, normal Incapacitation due to abnormal movements: None, normal Patient's awareness of abnormal movements (rate only patient's report): No Awareness, Dental Status Current problems with teeth and/or dentures?: No Does patient usually wear dentures?: No  CIWA:  CIWA-Ar Total: 1 COWS:  COWS Total Score: 2   Assessment -  Patient is presenting with ongoing significant depressive symptoms in spite of milieu and medication management . She presents sad, endorses some anhedonia, low energy, and presents with constricted affect . Appetite has improved, she is eating better, and has gained close to 10 lbs since admission.  She is denying any suicidal ideations , has not exhibited any self injurious behaviors on the unit, and has been able to contract for safety on the unit . She is tolerating Effexor XR, Latuda trials well, and both of these have been titrated up earlier this week. She is also on full dose of Lamictal and on Topamax .  Her BP has been low, and presents with hypotension , orthostasis. This may be related to Jordan .  We have discussed treatment options, to include changing medications trying to achieve better treatment response,, but she is reluctant to change her medications at present as they have just been initiated, titrated dose yesterday, and she expresses hope they will help. She is very reluctant to take any medication that may cause weight gain    Treatment Plan  Summary: Daily contact with patient to assess and evaluate symptoms and progress in treatment, Medication management, Plan inpatient admission and medications as below  Continue Topamax 100 mgr BID for migraine prophylaxis and for mood disorder  D/C  Latuda    Due to concern it  is contributing to orthostasis.  Encourage PO fluids . Repeat BMP .  Start Lithium Carbonate 300 mgrs QDAY initially .  Continue Effexor XR  150 mgrs QDAY for depression  Continue Lamictal 200 mgrs QDAY for mood disorder  Encourage ongoing group, milieu participation to work on stressors, coping skills, enhance mood  - patient agrees to attend more groups today Continue  Trazodone to 100 mgrs QHS PRN for Insomnia . Continue Armour Thyroid for Hypothyroidism.  Patient currently expresses being more amenable to consider IOP level of care after discharge .  Mother has contacted CSW - wanting to participate in patient's care- I have discussed this with patient- offered to have family meeting with patient/mother if she wants to- patient ambivalent at this time.   Nehemiah Massed, MD 12/10/2014, 11:21 AM

## 2014-12-10 NOTE — Plan of Care (Signed)
Problem: Consults Goal: Depression Patient Education See Patient Education Module for education specifics.  Outcome: Progressing Nurse discussed depression/coping skills with patient.        

## 2014-12-10 NOTE — BHH Group Notes (Signed)
Pt attended group today. Pt stated she had an OK day today. Pt expressed that she felt depressed a little today but feel a little better now. Pt stated that she didn't;t need anything from this writer at this time. Deatra James, MHT/NS

## 2014-12-10 NOTE — BHH Group Notes (Signed)
[facility] LCSW Aftercare Discharge Planning Group Note  12/10/2014 8:45 AM  Pt did not attend, declined invitation.   Chad Cordial, LCSWA 12/10/2014 1:31 PM

## 2014-12-10 NOTE — Tx Team (Signed)
Interdisciplinary Treatment Plan Update (Adult) Date: 12/09/2014   Date: 12/09/2014 8:39 AM  Progress in Treatment:  Attending groups: No Participating in groups: No Taking medication as prescribed: Yes  Tolerating medication: Yes  Family/Significant othe contact made:Yes with mother Patient understands diagnosis: Yes Discussing patient identified problems/goals with staff: Yes  Medical problems stabilized or resolved: Yes  Denies suicidal/homicidal ideation: Yes Patient has not harmed self or Others: Yes   New problem(s) identified: None identified at this time.   Discharge Plan or Barriers: Pt will return home and follow-up with Letta Moynahan and [facility]  Additional comments: n/a   Reason for Continuation of Hospitalization:  Depression Anxiety  Medication stabilization Suicidal ideation  Estimated length of stay: 3-5 days  Review of initial/current patient goals per problem list:   1.  Goal(s): Patient will participate in aftercare plan  Met:  Yes  Target date: 3-5 days from date of admission   As evidenced by: Patient will participate within aftercare plan AEB aftercare Amitai Delaughter and housing plan at discharge being identified.   12/02/14: pt will return home and follow-up with Letta Moynahan.  2.  Goal (s): Patient will exhibit decreased depressive symptoms and suicidal ideations.  Met:  No  Target date: 3-5 days from date of admission   As evidenced by: Patient will utilize self rating of depression at 3 or below and demonstrate decreased signs of depression or be deemed stable for discharge by MD. 12/02/14: Pt was admitted with symptoms of depression, rating 10/10. Pt continues to present with flat affect and depressive symptoms.  Pt will demonstrate decreased symptoms of depression and rate depression at 3/10 or lower prior to discharge. 12/04/14: Pt rates depression at 7/10; denies SI. 12/09/14: Pt rates depression at 6/10;  denies SI.  3.  Goal(s): Patient will demonstrate decreased signs and symptoms of anxiety.  Met:  Yes  Target date: 3-5 days from date of admission   As evidenced by: Patient will utilize self rating of anxiety at 3 or below and demonstrated decreased signs of anxiety, or be deemed stable for discharge by MD 12/02/14: Pt was admitted with increased levels of anxiety and is currently rating those symptoms highly. Pt will demonstrated decreased symptoms of anxiety and rate it at 3/10 prior to d/c. 12/04/14: Pt rates anxiety at 4/10. 12/09/14: pt rates anxiety at 0/10.   Attendees:  Patient:    Family:    Physician: Dr. Parke Poisson, MD  12/10/2014 8:39 AM  Nursing: Lars Pinks, RN Case manager  12/10/2014 8:39 AM  Clinical Social Worker Norman Clay, MSW 12/10/2014 8:39 AM  Other: Jake Bathe Liasion 12/10/2014 8:39 AM  Clinical:  Grayland Ormond, RN 12/10/2014 8:39 AM  Other: , RN Charge Nurse 12/10/2014 8:39 AM  Other:     Peri Maris, Latanya Presser MSW

## 2014-12-11 MED ORDER — HYDROXYZINE HCL 25 MG PO TABS
25.0000 mg | ORAL_TABLET | Freq: Every evening | ORAL | Status: DC | PRN
  Administered 2014-12-11 – 2014-12-12 (×2): 25 mg via ORAL
  Filled 2014-12-11 (×2): qty 1

## 2014-12-11 MED ORDER — LITHIUM CARBONATE ER 450 MG PO TBCR
450.0000 mg | EXTENDED_RELEASE_TABLET | Freq: Every day | ORAL | Status: DC
  Administered 2014-12-12 – 2014-12-13 (×2): 450 mg via ORAL
  Filled 2014-12-11 (×4): qty 1

## 2014-12-11 NOTE — Consult Note (Signed)
[facility] Medical Consultation  Tina [name] ZOX:[id number] DOB: May 14, 1970 DOA: 12/02/2014 PCP: Elie Goody, NP   Requesting physician: Dr. Jama Flavors Date of consultation: 12/11/2014 Reason for consultation: hypotension   Impression/Recommendations Principal Problem:   Bipolar I disorder, current or most recent episode depressed, in partial remission - management per primary team  Active Problems:   Hypotension - in pt who is rather thin, reports having low BP at baseline - pt is asymptomatic but certainly orthostatic, not clear why - has not been on any antihypertensive regimen  - since eating Ok, would not consider IVF at this time - also, if SBP persistently < 90, consider adding low dose Midodrine   I will followup again tomorrow. Please contact me if I can be of assistance in the meanwhile. Thank you for this consultation.  Chief Complaint: consult for hypotension   HPI:  Pt is 43 yo female with depression, chronically low BP, currently in [facility] for management of depression. [facility] consulted for orthostatic hypotension. Pt currently denies chest pain or shortness of breath, no fevers, chills, no abd or urinary concerns. Pt has rather flat affect and does not appear to be interested in answering questions. Her vital signs at the [facility] were positive for orthostatic hypotension. Pt reports eating well, appetite is OK. Gained few lbs while here at the [facility].  Review of Systems:   Constitutional: Negative for fever, chills, diaphoresis, activity change, appetite change and fatigue.  HENT: Negative for ear pain, nosebleeds, congestion, facial swelling, rhinorrhea, neck pain, neck stiffness and ear discharge.   Eyes: Negative for pain, discharge, redness, itching and visual disturbance.  Respiratory: Negative for cough, choking, chest tightness, shortness of breath, wheezing and stridor.   Cardiovascular: Negative for chest pain, palpitations and leg swelling.  Gastrointestinal:  Negative for abdominal distention.  Genitourinary: Negative for dysuria, urgency, frequency, hematuria, flank pain, decreased urine volume, difficulty urinating and dyspareunia.  Musculoskeletal: Negative for back pain, joint swelling, arthralgias and gait problem.  Neurological: Negative for seizures, syncope, facial asymmetry, speech difficulty, weakness, light-headedness, numbness and headaches.  Hematological: Negative for adenopathy. Does not bruise/bleed easily.  Psychiatric/Behavioral: Negative for hallucinations, behavioral problems    Past Medical History  Diagnosis Date  . History of chicken pox   . Hypothyroidism   . Genital warts   . Depression   . Bipolar 2 disorder   . Adrenal insufficiency   . Acne   . Cellulitis   . Hx of Lyme disease   . Headache    Past Surgical History  Procedure Laterality Date  . Tonsillectomy    . Cesarean section  2003, 2007, 2011  . Umbilical hernia repair    . Inguinal hernia repair Bilateral    Social History:  reports that she has been smoking Cigarettes.  She has a 43.5 pack-year smoking history. She does not have any smokeless tobacco history on file. She reports that she drinks about 7.2 - 10.8 oz of alcohol per week. She reports that she does not use illicit drugs.  Allergies  Allergen Reactions  . Stadol [Butorphanol] Other (See Comments)    Dependence  . Vancomycin Itching  . Penicillins Hives and Rash  . Sulfa Antibiotics Rash   Family History  Problem Relation Age of Onset  . Heart disease Other     Grandparent  . Alcohol abuse Paternal Grandmother     Prior to Admission medications   Medication Sig Start Date End Date Taking? Authorizing Provider  baclofen (LIORESAL) 20 MG tablet Take  1/2 tablet morning and afternoon  and 1 tablet at night Patient not taking: Reported on 12/01/2014 07/11/14   Court Joy, PA-C  buPROPion (WELLBUTRIN XL) 300 MG 24 hr tablet Take 300 mg by mouth daily.  08/07/14   Historical  Provider, MD  cholestyramine Lanetta Inch) 4 G packet Take 4 g by mouth daily as needed (lymes disease).  04/30/14   Historical Provider, MD  diphenhydrAMINE (BENADRYL) 50 MG capsule Take 1 capsule (50 mg total) by mouth every 8 (eight) hours as needed. Patient not taking: Reported on 12/01/2014 06/30/14   Nelly Rout, MD  escitalopram (LEXAPRO) 20 MG tablet 2 tablets daily Patient not taking: Reported on 12/01/2014 07/11/14   Court Joy, PA-C  FLUoxetine (PROZAC) 20 MG capsule Take 20 mg by mouth daily.    Historical Provider, MD  gabapentin (NEURONTIN) 400 MG capsule Take 1 capsule (400 mg total) by mouth 4 (four) times daily. Patient not taking: Reported on 12/01/2014 07/11/14 07/11/15  Court Joy, PA-C  lamoTRIgine (LAMICTAL) 200 MG tablet Take 200 mg by mouth at bedtime.  04/14/14   Historical Provider, MD  propranolol (INDERAL) 10 MG tablet Tak 1 dose in am .May repeat if needed for anxiety Patient not taking: Reported on 12/01/2014 06/27/14   Court Joy, PA-C  spironolactone (ALDACTONE) 100 MG tablet Take 100 mg by mouth daily.  08/23/14   Historical Provider, MD  thyroid (ARMOUR) 120 MG tablet Take 1 tablet (120 mg total) by mouth daily before breakfast. For hypothyroidism 02/20/13   Sanjuana Kava, NP  topiramate (TOPAMAX) 100 MG tablet Take 100 mg by mouth 2 (two) times daily. 04/30/14   Historical Provider, MD  traZODone (DESYREL) 100 MG tablet Take 100 mg by mouth at bedtime.  06/18/14   Historical Provider, MD  ziprasidone (GEODON) 80 MG capsule Take 240 mg by mouth at bedtime.  08/21/14   Historical Provider, MD   Physical Exam: Blood pressure 77/50, pulse 106, temperature 98.3 F (36.8 C), temperature source Oral, resp. rate 16, height 5" (0.127 m), weight 49.442 kg (109 lb), SpO2 100 %. Filed Vitals:   12/11/14 0631  BP: 77/50  Pulse: 106  Temp:   Resp:    Physical Exam  Constitutional: Appears thin and frail HENT: Normocephalic. External right and left ear normal. Oropharynx  is clear and moist.  Eyes: Conjunctivae and EOM are normal. PERRLA, no scleral icterus.  Neck: Normal ROM. Neck supple. No JVD. No tracheal deviation. No thyromegaly.  CVS: RRR, S1/S2 +, no murmurs, no gallops, no carotid bruit.  Pulmonary: Effort and breath sounds normal, no stridor, rhonchi, wheezes, rales.  Abdominal: Soft. BS +,  no distension, tenderness, rebound or guarding.  Musculoskeletal: Normal range of motion. No edema and no tenderness.  Lymphadenopathy: No lymphadenopathy noted, cervical, inguinal. Neuro: Alert. Normal reflexes, muscle tone coordination. No cranial nerve deficit. Skin: Skin is warm and dry. No rash noted. Not diaphoretic. No erythema. No pallor.  Psychiatric: Normal mood and affect. Behavior, judgment, thought content normal.     Labs on Admission:  Basic Metabolic Panel:  Recent Labs Lab 12/10/14 1958  NA 137  K 4.5  CL 108  CO2 25  GLUCOSE 95  BUN 15  CREATININE 0.55  CALCIUM 9.2   Liver Function Tests: No results for input(s): AST, ALT, ALKPHOS, BILITOT, PROT, ALBUMIN in the last 168 hours. No results for input(s): LIPASE, AMYLASE in the last 168 hours. No results for input(s): AMMONIA in the last 168 hours.  CBC: No results for input(s): WBC, NEUTROABS, HGB, HCT, MCV, PLT in the last 168 hours. Cardiac Enzymes: No results for input(s): CKTOTAL, CKMB, CKMBINDEX, TROPONINI in the last 168 hours. BNP: Invalid input(s): POCBNP CBG: No results for input(s): GLUCAP in the last 168 hours.  Radiological Exams on Admission: No results found.  EKG: not done   Time spent: 45 minutes   Tina [name], Tina [name] [facility] Pager [phone]  If 7PM-7AM, please contact night-coverage [email or web address] Password [facility] 12/11/2014, 7:19 PM

## 2014-12-11 NOTE — BHH Group Notes (Signed)
[facility] Mental Health Association Group Therapy 12/11/2014 1:15pm  Type of Therapy: Mental Health Association Presentation  Participation Level: Minimal  Participation Quality: Inattentive  Affect: Flat  Cognitive: Oriented  Insight: Developing/Improving  Engagement in Therapy: Limited  Modes of Intervention: Discussion, Education and Socialization  Summary of Progress/Problems: Mental Health Association (MHA) Speaker came to talk about his personal journey with substance abuse and addiction. The pt processed ways by which to relate to the speaker. MHA speaker provided handouts and educational information pertaining to groups and services offered by the [facility]. Pt disengaged during presentation.   Chad Cordial, LCSWA 12/11/2014 1:50 PM

## 2014-12-11 NOTE — Progress Notes (Signed)
D: Patient in bed on approach.  Patient states, "I am still Depressed, I'm just depressed."  Patient states her mother visited her today and she states it was a good visit.  Patient has flat affect and intense stare.  Patient appears to have a depressed mood."  Patient did not identify a goal for today.  Patient states, "If I were home I would be more depressed."  Patient was in bed during group tonight and she did not come back out after visitation was over.  Patient slept for approx. 2 hours.  Patient denies SI/HI and denies AVH.  A: Staff to monitor Q 15 mins for safety.  Encouragement and support offered.  Scheduled medications administered per orders.  Vistaril administered prn for sleep R: Patient remains safe on the unit.  Patient did not attend group tonight.  Patient visible on the unit during visitation.  Patient taking administered medications.

## 2014-12-11 NOTE — Progress Notes (Signed)
D: Katiana has been cooperative but isolative on unit. She has remained in her room for much of the day but emerged in the afternoon. She attended at least one afternoon group. She rated depression 6, hopelessness 6, and anxiety zero. She denied SI/HI/AVH/pain and listed her goal as "to stay out of room" on her self-inventory. She verbalizes few needs but has been encouraged to do so.  A: Meds given as ordered. Q15 safety checks maintained. Support/encouragement offered. R: Pt remains free from harm and continues with treatment. Will continue to monitor for needs/safety.

## 2014-12-11 NOTE — Tx Team (Signed)
Interdisciplinary Treatment Plan Update (Adult) Date: 12/09/2014   Date: 12/09/2014 1:51 PM  Progress in Treatment:  Attending groups: No Participating in groups: No Taking medication as prescribed: Yes  Tolerating medication: Yes  Family/Significant othe contact made:Yes with mother Patient understands diagnosis: Yes Discussing patient identified problems/goals with staff: Yes  Medical problems stabilized or resolved: Yes  Denies suicidal/homicidal ideation: Yes Patient has not harmed self or Others: Yes   New problem(s) identified: None identified at this time.   Discharge Plan or Barriers: Pt will return home and follow-up with Letta Moynahan and [facility]  Additional comments: n/a   Reason for Continuation of Hospitalization:  Depression Anxiety  Medication stabilization Suicidal ideation  Estimated length of stay: 3-5 days  Review of initial/current patient goals per problem list:   1.  Goal(s): Patient will participate in aftercare plan  Met:  Yes  Target date: 3-5 days from date of admission   As evidenced by: Patient will participate within aftercare plan AEB aftercare Detravion Tester and housing plan at discharge being identified.   12/02/14: pt will return home and follow-up with Letta Moynahan.  2.  Goal (s): Patient will exhibit decreased depressive symptoms and suicidal ideations.  Met:  No  Target date: 3-5 days from date of admission   As evidenced by: Patient will utilize self rating of depression at 3 or below and demonstrate decreased signs of depression or be deemed stable for discharge by MD. 12/02/14: Pt was admitted with symptoms of depression, rating 10/10. Pt continues to present with flat affect and depressive symptoms.  Pt will demonstrate decreased symptoms of depression and rate depression at 3/10 or lower prior to discharge. 12/04/14: Pt rates depression at 7/10; denies SI. 12/09/14: Pt rates depression at 6/10;  denies SI. 12/11/14: Pt rates depression at 6/10; denies SI.  3.  Goal(s): Patient will demonstrate decreased signs and symptoms of anxiety.  Met:  Yes  Target date: 3-5 days from date of admission   As evidenced by: Patient will utilize self rating of anxiety at 3 or below and demonstrated decreased signs of anxiety, or be deemed stable for discharge by MD 12/02/14: Pt was admitted with increased levels of anxiety and is currently rating those symptoms highly. Pt will demonstrated decreased symptoms of anxiety and rate it at 3/10 prior to d/c. 12/04/14: Pt rates anxiety at 4/10. 12/09/14: pt rates anxiety at 0/10.   Attendees:  Patient:    Family:    Physician: Dr. Parke Poisson, MD  12/11/2014 1:51 PM  Nursing: Lars Pinks, RN Case manager  12/11/2014 1:51 PM  Clinical Social Worker Peri Maris, Latanya Presser, MSW 12/11/2014 1:51 PM  Other: Lucinda Dell, Beverly Sessions Liasion 12/11/2014 1:51 PM  Clinical:  Kerby Nora, RN 12/11/2014 1:51 PM  Other: , RN Charge Nurse 12/11/2014 1:51 PM  Other:     Peri Maris, [place] MSW

## 2014-12-11 NOTE — [facility] Group Notes (Signed)
[facility] Group Notes:  (Nursing/MHT/Case Management/Adjunct)  Date:  12/11/2014  Time:  0900  Type of Therapy:  Nurse Education  Participation Level:  Did Not Attend  Participation Quality:    Affect:    Cognitive:    Insight:    Engagement in Group:    Modes of Intervention:    Summary of Progress/Problems: Pt was invited to group but chose not to attend.  Maurine Simmering 12/11/2014, 10:00 AM

## 2014-12-11 NOTE — Progress Notes (Signed)
Patient ID: Tina [name], female   DOB: April 26, 1970, 44 y.o.   MRN: [id number] [facility] MD Progress Note  12/11/2014 4:23 PM Tina [name]  MRN:  [id number] Subjective:    Continues to describe severe depression. She denies medication side effects. She is reluctant to switch from Effexor XR to another antidepressant at this time- states  That because it is a new medication trial and dose was just recently increased, she feels hopeful " it will kick in soon".  She denies any active suicidal ideations, but reports occasional ruminations about death/ dying. She does contract for safety on the unit .   Objective: Pt seen and chart reviewed.  Case discussed with treatment team. Persistent depressive symptoms include constricted affect, low energy level, ongoing subjective sense of sadness. Sleep has improved, appetite fair. As noted, no active SI at this time and able to contract for safety .  Also, denies psychotic symptoms, and does not appear internally preoccupied . Patient has been presenting with hypotension/ orthostasis, but minimizes symptoms associated with it . She denies dizziness or pre-syncopal symptoms. We had D/Cd Latuda due to concern it may be contributing to her hypotension, but BP has remained low.  Labs reviewed- electrolytes , BUN, Creatinine WNL. With patient's express consent and in her presence i spoke with patient's mother on the phone - mother corroborated well documented history of Bipolar  Disorder, with at least one distinct manic episode in the past . States that [name] is very knowledgeable about psychiatric medications.  Thus far tolerating Lithium augmentation well- no side effects. We are titrating slowly to minimize potential drug- drug interactions. She has been on Lithium in the remote past ( 15 + years ago) and does not remember having any side effects.    Principal Problem: Bipolar I disorder, current or most recent episode depressed, in partial remission Diagnosis:    Patient Active Problem List   Diagnosis Date Noted  . Bipolar affective disorder, current episode severe [F31.9] 12/02/2014  . Bipolar I disorder, current or most recent episode depressed, in partial remission [F31.75] 07/11/2014  . Bipolar 1 disorder, mixed [F31.60] 06/27/2014  . Disease of thyroid gland [E07.9] 06/27/2014  . Anxiety [F41.9] 06/27/2014  . 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 Bipolar 1 disorder, depressed, partial remission [F31.75] 06/27/2014  . 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 Alcohol use disorder, severe, dependence [F10.20] 06/24/2014  . Bipolar disorder, unspecified [F31.9] 02/16/2013  . Adrenal insufficiency [E27.40]   . Acne [L70.9]   . Cellulitis [L03.90]   . Hx of Lyme disease [Z86.19]   . Headache [R51]   . 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 Bipolar I disorder, most recent episode (or current) manic [F31.10] 08/29/2012  . Unspecified hypothyroidism [E03.9] 12/23/2010  . Myalgia [M79.1] 12/23/2010  . Fatigue [R53.83] 12/23/2010  . Unspecified vitamin deficiency [E56.9] 12/23/2010  . Depression [F32.9]    Total Time spent with patient: 25 minutes  Past Medical History:  Past Medical History  Diagnosis Date  . History of chicken pox   . Hypothyroidism   . Genital warts   . Depression   . Bipolar 2 disorder   . Adrenal insufficiency   . Acne   . Cellulitis   . Hx of Lyme disease   . Headache     Past Surgical History  Procedure Laterality Date  . Tonsillectomy    . Cesarean section  2003, 2007, 2011  . Umbilical hernia  repair    . Inguinal hernia repair Bilateral    Family History:  Family History  Problem Relation Age of Onset  . Heart disease Other     Grandparent  . Alcohol abuse Paternal Grandmother    Social History:  History  Alcohol Use  . 7.2 - 10.8 oz/week  . 2-8 Cans of beer, 10 Shots of liquor per week    Comment: social  drinker-few drinks a week      History  Drug Use No    Social History   Social History  . Marital Status: Married    Spouse Name: N/A  . Number of Children: N/A  . Years of Education: 16   Occupational History  . HOMEMAKER    Social History Main Topics  . Smoking status: Current Every Day Smoker -- 1.50 packs/day for 29 years    Types: Cigarettes  . Smokeless tobacco: None  . Alcohol Use: 7.2 - 10.8 oz/week    2-8 Cans of beer, 10 Shots of liquor per week     Comment: social drinker-few drinks a week   . Drug Use: No  . Sexual Activity: Not Currently   Other Topics Concern  . None   Social History Narrative   Regular exercise-yes   Additional History:    Sleep: good   Appetite: improving - weight has increased and she has gained 9 lbs since admission- current BMI 21.5    Musculoskeletal: Strength & Muscle Tone: within normal limits Gait & Station: normal Patient leans: N/A  Psychiatric Specialty Exam: Physical Exam  Review of Systems  Psychiatric/Behavioral: Positive for depression. Negative for suicidal ideas and hallucinations. The patient is nervous/anxious.   All other systems reviewed and are negative.  no chest pain, no shortness of breath, no  Nausea ,  No vomiting, denies dizziness . No falls .   Blood pressure 77/50, pulse 106, temperature 98.3 F (36.8 C), temperature source Oral, resp. rate 16, height 5" (0.127 m), weight 109 lb (49.442 kg), SpO2 100 %.Body mass index is 3,065.41 kg/(m^2).  General Appearance: improved grooming   Eye Contact::  Good  Speech:  Normal Rate  Volume:  Normal  Mood:  Remains depressed   Affect:   Remains constricted,   Although does smile briefly at times   Thought Process:  Goal Directed and Linear  Orientation:  Full (Time, Place, and Person)  Thought Content:  denies hallucinations, no delusions , not internally preoccupied  remains sad and ruminative about psychosocial stressors , mostly being divorced   Suicidal  Thoughts:  No endorses some passive thoughts of death, but denies any actual SI, denies any plan or intention of hurting self on unit and contracts for safety   Homicidal Thoughts:  No  Memory:  recent and remote grossly intact   Judgement:  Fair  Insight:  Present  Psychomotor Activity:  Normal  Concentration:  Good  Recall:  Good  Fund of Knowledge:Good  Language: Good  Akathisia:  Negative  Handed:  Right  AIMS (if indicated):     Assets:  Desire for Improvement Housing Resilience Vocational/Educational  ADL's:   Improved   Cognition: WNL  Sleep:  Number of Hours: 7     Current Medications: Current Facility-Administered Medications  Medication Dose Route Frequency Provider Last Rate Last Dose  . acetaminophen (TYLENOL) tablet 650 mg  650 mg Oral Q6H PRN Laverle Hobby, PA-C      . alum & mag hydroxide-simeth (MAALOX/MYLANTA) 200-200-20 MG/5ML suspension 30  mL  30 mL Oral Q4H PRN Laverle Hobby, PA-C      . bisacodyl (DULCOLAX) EC tablet 5 mg  5 mg Oral Daily PRN Harriet Butte, NP   5 mg at 12/05/14 2145  . feeding supplement (ENSURE ENLIVE) (ENSURE ENLIVE) liquid 237 mL  237 mL Oral BID BM Myer Peer Cobos, MD   237 mL at 12/11/14 1605  . hydrOXYzine (ATARAX/VISTARIL) tablet 25 mg  25 mg Oral QHS PRN Jenne Campus, MD      . lamoTRIgine (LAMICTAL) tablet 200 mg  200 mg Oral QHS Laverle Hobby, PA-C   200 mg at 12/10/14 2145  . lithium carbonate (LITHOBID) CR tablet 300 mg  300 mg Oral Daily Jenne Campus, MD   300 mg at 12/11/14 0826  . magnesium hydroxide (MILK OF MAGNESIA) suspension 30 mL  30 mL Oral Daily PRN Laverle Hobby, PA-C   30 mL at 12/09/14 2032  . nicotine polacrilex (NICORETTE) gum 2 mg  2 mg Oral PRN Jenne Campus, MD   2 mg at 12/04/14 1320  . thyroid (ARMOUR) tablet 120 mg  120 mg Oral QAC breakfast Laverle Hobby, PA-C   120 mg at 12/11/14 3532  . topiramate (TOPAMAX) tablet 100 mg  100 mg Oral BID Laverle Hobby, PA-C   100 mg at 12/11/14  1605  . venlafaxine XR (EFFEXOR-XR) 24 hr capsule 150 mg  150 mg Oral Q breakfast Jenne Campus, MD   150 mg at 12/11/14 9924    Lab Results:  Results for orders placed or performed during the hospital encounter of 12/02/14 (from the past 48 hour(s))  Basic metabolic panel     Status: Abnormal   Collection Time: 12/10/14  7:58 PM  Result Value Ref Range   Sodium 137 135 - 145 mmol/L   Potassium 4.5 3.5 - 5.1 mmol/L   Chloride 108 101 - 111 mmol/L   CO2 25 22 - 32 mmol/L   Glucose, Bld 95 65 - 99 mg/dL   BUN 15 6 - 20 mg/dL   Creatinine, Ser 0.55 0.44 - 1.00 mg/dL   Calcium 9.2 8.9 - 10.3 mg/dL   GFR calc non Af Amer >60 >60 mL/min   GFR calc Af Amer >60 >60 mL/min    Comment: (NOTE) The eGFR has been calculated using the CKD EPI equation. This calculation has not been validated in all clinical situations. eGFR's persistently <60 mL/min signify possible Chronic Kidney Disease.    Anion gap 4 (L) 5 - 15    Comment: Performed at [facility]    Physical Findings: AIMS: Facial and Oral Movements Muscles of Facial Expression: None, normal Lips and Perioral Area: None, normal Jaw: None, normal Tongue: None, normal,Extremity Movements Upper (arms, wrists, hands, fingers): None, normal Lower (legs, knees, ankles, toes): None, normal, Trunk Movements Neck, shoulders, hips: None, normal, Overall Severity Severity of abnormal movements (highest score from questions above): None, normal Incapacitation due to abnormal movements: None, normal Patient's awareness of abnormal movements (rate only patient's report): No Awareness, Dental Status Current problems with teeth and/or dentures?: No Does patient usually wear dentures?: No  CIWA:  CIWA-Ar Total: 1 COWS:  COWS Total Score: 2   Assessment -  Patient presenting with ongoing depression, which has been severe/ persistent  In spite of recent medication changes /adjustments . She is not psychotic . She is not  actively suicidal at this time, and is able to contract for  safety.   She denies medication side effects  , but is presenting  with significant orthostatic hypotension, which she denies having symptoms from.  Electrolytes are WNL at this time. She wants to proceed with Effexor XR trial at this time- it was recently titrated to 150 mgrs QDAY. She has been on Lamictal and on Topamax for years , without side effects . I am going to D/C Trazodone , which may be contributing to hypotension.        Treatment Plan Summary: Daily contact with patient to assess and evaluate symptoms and progress in treatment, Medication management, Plan inpatient admission and medications as below  Continue Topamax 100 mgr BID for migraine prophylaxis and for mood disorder  D/C  Trazodone ,  Due concern it is contributing to orthostasis.  Continue to Encourage PO fluids . Increase  Lithium  To 450  mgrs QDAY  For mood disorder   Continue Effexor XR  150 mgrs QDAY for depression  Continue Lamictal 200 mgrs QDAY for mood disorder  Encourage ongoing group, milieu participation to work on stressors, coping skills, enhance mood  - patient agrees to attend more groups today Continue Armour Thyroid for Hypothyroidism.  I have requested Hospitalist Consult to assist with management of hypotension and possible adjustment of Thyroid medication management , if indicated .  Tina Garnet, MD 12/11/2014, 4:23 PM

## 2014-12-11 NOTE — Progress Notes (Signed)
Patient did not attend the evening karaoke group. Pt was notified that group was beginning but returned to bed.   

## 2014-12-12 DIAGNOSIS — F314 Bipolar disorder, current episode depressed, severe, without psychotic features: Secondary | ICD-10-CM | POA: Insufficient documentation

## 2014-12-12 DIAGNOSIS — I951 Orthostatic hypotension: Secondary | ICD-10-CM

## 2014-12-12 LAB — BASIC METABOLIC PANEL
Anion gap: 6 (ref 5–15)
BUN: 19 mg/dL (ref 6–20)
CHLORIDE: 106 mmol/L (ref 101–111)
CO2: 24 mmol/L (ref 22–32)
CREATININE: 0.65 mg/dL (ref 0.44–1.00)
Calcium: 9.5 mg/dL (ref 8.9–10.3)
Glucose, Bld: 90 mg/dL (ref 65–99)
POTASSIUM: 4.3 mmol/L (ref 3.5–5.1)
SODIUM: 136 mmol/L (ref 135–145)

## 2014-12-12 LAB — URINALYSIS, ROUTINE W REFLEX MICROSCOPIC
Bilirubin Urine: NEGATIVE
Glucose, UA: NEGATIVE mg/dL
Hgb urine dipstick: NEGATIVE
KETONES UR: NEGATIVE mg/dL
LEUKOCYTES UA: NEGATIVE
NITRITE: NEGATIVE
PROTEIN: NEGATIVE mg/dL
Specific Gravity, Urine: 1.005 (ref 1.005–1.030)
UROBILINOGEN UA: 0.2 mg/dL (ref 0.0–1.0)
pH: 7.5 (ref 5.0–8.0)

## 2014-12-12 LAB — LITHIUM LEVEL: LITHIUM LVL: 0.44 mmol/L — AB (ref 0.60–1.20)

## 2014-12-12 LAB — CBC
HCT: 38.8 % (ref 36.0–46.0)
HEMOGLOBIN: 13.7 g/dL (ref 12.0–15.0)
MCH: 32.5 pg (ref 26.0–34.0)
MCHC: 35.3 g/dL (ref 30.0–36.0)
MCV: 92.2 fL (ref 78.0–100.0)
Platelets: 301 10*3/uL (ref 150–400)
RBC: 4.21 MIL/uL (ref 3.87–5.11)
RDW: 12.7 % (ref 11.5–15.5)
WBC: 7.2 10*3/uL (ref 4.0–10.5)

## 2014-12-12 LAB — RAPID URINE DRUG SCREEN, HOSP PERFORMED
Amphetamines: NOT DETECTED
BARBITURATES: NOT DETECTED
Benzodiazepines: NOT DETECTED
Cocaine: NOT DETECTED
Opiates: NOT DETECTED
Tetrahydrocannabinol: NOT DETECTED

## 2014-12-12 LAB — PREGNANCY, URINE: PREG TEST UR: NEGATIVE

## 2014-12-12 MED ORDER — SODIUM CHLORIDE 1 G PO TABS
2.0000 g | ORAL_TABLET | Freq: Three times a day (TID) | ORAL | Status: AC
  Administered 2014-12-12 – 2014-12-14 (×5): 2 g via ORAL
  Filled 2014-12-12 (×6): qty 2

## 2014-12-12 MED ORDER — HYDROCORTISONE 20 MG PO TABS
20.0000 mg | ORAL_TABLET | Freq: Every day | ORAL | Status: DC
  Filled 2014-12-12 (×2): qty 1

## 2014-12-12 NOTE — Plan of Care (Signed)
Problem: Alteration in mood; excessive anxiety as evidenced by: Goal: STG-Patient can identify triggers for anxiety Outcome: Progressing Pt reports triggers for anxiety are home and work.

## 2014-12-12 NOTE — Progress Notes (Addendum)
D: Patient states she is depressed.  Patient in bed on approach.  Patient states she has been spending the mut time in bed.  Patient states she also sis not feel well because her blood pressure was low so she opted to stay in bed.  Patient has been drinking gatorade tonight.  Patient taking administered medications.  Patient denies SI/HI and denies AVH.  Patient states she mother visited today and it was a good visit. A: Staff to monitor Q 15 mins for safety.  Encouragement and support offered.  Scheduled medications administered per orders. R: Patient remains safe on the unit.  Patient did not attend group tonight.  Patient not visible on the unit and not interacting with peers.  Patient taking administered medications.

## 2014-12-12 NOTE — [facility] Group Notes (Signed)
[facility] LCSW Group Therapy 12/12/2014 1:15pm  Type of Therapy: Group Therapy- Feelings Around Relapse and Recovery  Pt did not attend, declined invitation.   Chad Cordial, Theresia Majors [phone] 12/12/2014 4:55 PM

## 2014-12-12 NOTE — Progress Notes (Signed)
Pt ate a small bowl of fruit and a few bites of shrimp. She reports reduced appetite. Gave another picture of water. Safety maintained.

## 2014-12-12 NOTE — Progress Notes (Signed)
Pt was notified about group but did not attend. Pt stayed in bed sleeping.

## 2014-12-12 NOTE — Progress Notes (Signed)
[facility] PROGRESS NOTE    Progress Note   Tina [name] WUJ:[id number] DOB: 1970-09-17 DOA: 12/02/2014 PCP: Elie Goody, NP   Brief Narrative:   Tina [name] is an 44 y.o. female past medical history of depression chronic blood blood pressure currently being managed for depression at [facility] age were consulted for orthostatic hypotension  Assessment/Plan:   Principal Problem:   Bipolar I disorder, current or most recent episode depressed, in partial remission Active Problems:   Bipolar affective disorder, current episode severe  continue further management per psychiatrist  Orthostatic hypotension: Orthostatics are positive she relates mild lightheadedness upon standing, she has remained afebrile. We'll check a CBC to look for any leukocytosis or any drop in her hemoglobin although she denies any private blood per rectum or melanotic stools. Also check a basic metabolic panel to evaluate her renal function. I have personally reviewed the EKG and shows a normal sinus rhythm with a normal axis no ST segment abnormalities, will recheck a UDS and a urinalysis. She will need up blood cortisone level drawn in the morning. To evaluate her for possible adrenal insufficiency as there is documentation in the chart that this is the possibility. When I ask her about her adrenal insufficiency she relates she hasn't taken steroids in several months. She will she has not been taking any new medications or any changes in her medication. She relates she has not taking any over-the-counter medication or any recreational drugs. She denies any nausea vomiting or diarrhea. Neurological condition at her age causing this out anomia it is unlikely. On her vacation list she is on trazodone and Prozac which are known to cause orthostatic hypotension. Here in the hospital she is on Effexor which has SSRI property like activity. I will continue to hydrate her orally, she should drink more than 3 L orally and  have recheck orthostatic vitals in the morning. Supplements like ensure are not a good source of volume, so I have encouraged her to drink more free water. We'll give her salt tablets, as this can make her thirsty and encourage drinking. I will DC stool softeners endoscopic as this can cause diarrhea. Lithium can be interchangeably exchanged by the kidney for sodium so we'll check a lithium level.    DVT Prophylaxis - Lovenox ordered.  Family Communication: none Disposition Plan: Home when stable. Code Status:     Code Status Orders        Start     Ordered   12/02/14 0212  Full code   Continuous     12/02/14 0211        IV Access:    Peripheral IV   Procedures and diagnostic studies:   No results found.   Medical Consultants:    None.  Anti-Infectives:   Anti-infectives    None      Subjective:    Tina [name] she feels mild lightheadedness with sitting up and standing. She denies any nausea vomiting or diarrhea. She has not taking her steroids and a very long time she has taking her antidepressants.  Objective:    Filed Vitals:   12/12/14 0800 12/12/14 0801 12/12/14 1030 12/12/14 1031  BP: 65/53  Pulse: 90 116 91 111  Temp: 98 F (36.7 C)     TempSrc:      Resp: 16     Height:      Weight:      SpO2:       No intake or  output data in the 24 hours ending 12/12/14 1049 Filed Weights   12/02/14 0146 12/08/14 2311  Weight: 45.36 kg (100 lb) 49.442 kg (109 lb)    Exam: Gen:  NAD Cardiovascular:  RRR, No M/R/G Respiratory:  Lungs CTAB Gastrointestinal:  Abdomen soft, NT/ND, + BS Extremities:  No C/E/C   Data Reviewed:    Labs: Basic Metabolic Panel:  Recent Labs Lab 12/10/14 1958  NA 137  K 4.5  CL 108  CO2 25  GLUCOSE 95  BUN 15  CREATININE 0.55  CALCIUM 9.2   GFR CrCl cannot be calculated (Unknown ideal weight.). Liver Function Tests: No results for input(s): AST, ALT, ALKPHOS, BILITOT,  PROT, ALBUMIN in the last 168 hours. No results for input(s): LIPASE, AMYLASE in the last 168 hours. No results for input(s): AMMONIA in the last 168 hours. Coagulation profile No results for input(s): INR, PROTIME in the last 168 hours.  CBC: No results for input(s): WBC, NEUTROABS, HGB, HCT, MCV, PLT in the last 168 hours. Cardiac Enzymes: No results for input(s): CKTOTAL, CKMB, CKMBINDEX, TROPONINI in the last 168 hours. BNP (last 3 results) No results for input(s): PROBNP in the last 8760 hours. CBG: No results for input(s): GLUCAP in the last 168 hours. D-Dimer: No results for input(s): DDIMER in the last 72 hours. Hgb A1c: No results for input(s): HGBA1C in the last 72 hours. Lipid Profile: No results for input(s): CHOL, HDL, LDLCALC, TRIG, CHOLHDL, LDLDIRECT in the last 72 hours. Thyroid function studies: No results for input(s): TSH, T4TOTAL, T3FREE, THYROIDAB in the last 72 hours.  Invalid input(s): FREET3 Anemia work up: No results for input(s): VITAMINB12, FOLATE, FERRITIN, TIBC, IRON, RETICCTPCT in the last 72 hours. Sepsis Labs: No results for input(s): PROCALCITON, WBC, LATICACIDVEN in the last 168 hours. Microbiology No results found for this or any previous visit (from the past 240 hour(s)).   Medications:   . feeding supplement (ENSURE ENLIVE)  237 mL Oral BID BM  . lamoTRIgine  200 mg Oral QHS  . lithium carbonate  450 mg Oral Daily  . thyroid  120 mg Oral QAC breakfast  . topiramate  100 mg Oral BID  . venlafaxine XR  150 mg Oral Q breakfast   Continuous Infusions:   Time spent: 25 min   LOS: 10 days   Marinda Elk  [facility] Pager [phone]  *Please refer to [email or web address], password [email or web address] to get updated schedule on who will round on this patient, as hospitalists switch teams weekly. If 7PM-7AM, please contact night-coverage at [email or web address], password [email or web address] for any overnight needs.  12/12/2014, 10:49 AM

## 2014-12-12 NOTE — [facility] Group Notes (Signed)
[facility] LCSW Aftercare Discharge Planning Group Note  12/12/2014 8:45 AM  Pt did not attend, declined invitation.   Chad Cordial, LCSWA 12/12/2014 9:31 AM

## 2014-12-12 NOTE — Progress Notes (Signed)
Pt given multiple pitchers of water throughout the day. Pt has been encouraged to drink fluids and eat meals. She ate a few bites for lunch reporting a decrease in appetite. Pt has been in the bed most of the day. She did come out of her room this afternoon to talk on the phone. Will continue to encourage and monitor. Safety maintained.

## 2014-12-12 NOTE — Progress Notes (Addendum)
Patient ID: Tina [name], female   DOB: 1970/09/16, 44 y.o.   MRN: [id number] [facility] MD Progress Note  12/12/2014 1:55 PM Tina [name]  MRN:  [id number] Subjective:    Reports feeling " about the same".  Reports feeling depressed , sad, but denies suicidal ideations. Thus far has tolerated Lithium well - of note, it is not felt that Lithium is causing orhtostasis, as she had low BP/orthostasis prior to adding this medication. She is orthostatic, hypotensive upon standing, but has had relatively mild symptoms, reports feeling " a little dizzy" when she first stands up. She states she is hopeful about Effexor XR- states " people I know have done very well with it, I am still thinking it is going to kick in , don't want to stop it yet ".    Objective: Pt seen and chart reviewed.  Case discussed with treatment team. Patient tends to isolate in room, with limited group/milieu participation. She denies medication side effects - she is now off Latuda, Trazodone, due to concerns that these could be causing or worsening her orthostatic hypotension. I have discussed her medication regimen with pharmacist - as noted, has been on lamictal and topamax for many months, and it is not felt that these medications are causative . Effexor XR not likely to be associated with orthostasis. Appreciate hospitalist follow up, involvement to address above .  Denies any  psychotic symptoms, and does not appear internally preoccupied . Of note, patient is eating , although does state appetite is poor, and as noted, she has gained weight since admission. She has been drinking fluids troughout the day.  I have reviewed with patient the option of ECT to address severe, treatment resistant depression- at this time not interested in this option.  Of note, EKG NSR pulse 73.     Principal Problem: Bipolar I disorder, current or most recent episode depressed, in partial remission Diagnosis:   Patient Active Problem List    Diagnosis Date Noted  . Orthostatic hypotension [I95.1] 12/12/2014  . Bipolar affective disorder, current episode severe [F31.9] 12/02/2014  . Bipolar I disorder, current or most recent episode depressed, in partial remission [F31.75] 07/11/2014  . Bipolar 1 disorder, mixed [F31.60] 06/27/2014  . Disease of thyroid gland [E07.9] 06/27/2014  . Anxiety [F41.9] 06/27/2014  . 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 Bipolar 1 disorder, depressed, partial remission [F31.75] 06/27/2014  . 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 Alcohol use disorder, severe, dependence [F10.20] 06/24/2014  . Bipolar disorder, unspecified [F31.9] 02/16/2013  . Adrenal insufficiency [E27.40]   . Acne [L70.9]   . Cellulitis [L03.90]   . Hx of Lyme disease [Z86.19]   . Headache [R51]   . 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 Bipolar I disorder, most recent episode (or current) manic [F31.10] 08/29/2012  . Unspecified hypothyroidism [E03.9] 12/23/2010  . Myalgia [M79.1] 12/23/2010  . Fatigue [R53.83] 12/23/2010  . Unspecified vitamin deficiency [E56.9] 12/23/2010  . Depression [F32.9]    Total Time spent with patient: 25 minutes  Past Medical History:  Past Medical History  Diagnosis Date  . History of chicken pox   . Hypothyroidism   . Genital warts   . Depression   . Bipolar 2 disorder   . Adrenal insufficiency   . Acne   . Cellulitis   . Hx of Lyme disease   . Headache     Past Surgical History  Procedure Laterality Date  .  Tonsillectomy    . Cesarean section  2003, 2007, 2011  . Umbilical hernia repair    . Inguinal hernia repair Bilateral    Family History:  Family History  Problem Relation Age of Onset  . Heart disease Other     Grandparent  . Alcohol abuse Paternal Grandmother    Social History:  History  Alcohol Use  . 7.2 - 10.8 oz/week  . 2-8 Cans of beer, 10 Shots of liquor per week     Comment: social drinker-few drinks a week      History  Drug Use No    Social History   Social History  . Marital Status: Married    Spouse Name: N/A  . Number of Children: N/A  . Years of Education: 16   Occupational History  . HOMEMAKER    Social History Main Topics  . Smoking status: Current Every Day Smoker -- 1.50 packs/day for 29 years    Types: Cigarettes  . Smokeless tobacco: None  . Alcohol Use: 7.2 - 10.8 oz/week    2-8 Cans of beer, 10 Shots of liquor per week     Comment: social drinker-few drinks a week   . Drug Use: No  . Sexual Activity: Not Currently   Other Topics Concern  . None   Social History Narrative   Regular exercise-yes   Additional History:    Sleep: good   Appetite: improving - weight has increased and she has gained 9 lbs since admission- current BMI 21.5    Musculoskeletal: Strength & Muscle Tone: within normal limits Gait & Station: normal Patient leans: N/A  Psychiatric Specialty Exam: Physical Exam  Review of Systems  Psychiatric/Behavioral: Positive for depression. Negative for suicidal ideas and hallucinations. The patient is nervous/anxious.   All other systems reviewed and are negative.  no chest pain, no shortness of breath, no  Nausea ,  No vomiting,  Reports mild dizziness when she first stands up . No falls .   Blood pressure 78/50, pulse 115, temperature 98 F (36.7 C), temperature source Oral, resp. rate 16, height 5" (0.127 m), weight 109 lb (49.442 kg), SpO2 100 %.Body mass index is 3,065.41 kg/(m^2).  General Appearance: improved grooming   Eye Contact::  Good  Speech:  Normal Rate  Volume:  Normal  Mood:  Remains depressed   Affect:   Remains constricted, sad  Thought Process:  Goal Directed and Linear  Orientation:  Full (Time, Place, and Person)  Thought Content:  denies hallucinations, no delusions , not internally preoccupied    Suicidal Thoughts:  No denies any  SI, denies any plan or intention of hurting  self on unit and contracts for safety   Homicidal Thoughts:  No  Memory:  recent and remote grossly intact   Judgement:  Fair  Insight:  Present  Psychomotor Activity:   decreased   Concentration:  Good  Recall:  Good  Fund of Knowledge:Good  Language: Good  Akathisia:  Negative  Handed:  Right  AIMS (if indicated):     Assets:  Desire for Improvement Housing Resilience Vocational/Educational  ADL's:   Improved   Cognition: WNL  Sleep:  Number of Hours: 6.5     Current Medications: Current Facility-Administered Medications  Medication Dose Route Frequency Cinthia Rodden Last Rate Last Dose  . acetaminophen (TYLENOL) tablet 650 mg  650 mg Oral Q6H PRN Laverle Hobby, PA-C      . alum & mag hydroxide-simeth (MAALOX/MYLANTA) 200-200-20 MG/5ML suspension 30 mL  30 mL Oral Q4H PRN Laverle Hobby, PA-C      . feeding supplement (ENSURE ENLIVE) (ENSURE ENLIVE) liquid 237 mL  237 mL Oral BID BM Myer Peer Cobos, MD   237 mL at 12/12/14 1049  . hydrOXYzine (ATARAX/VISTARIL) tablet 25 mg  25 mg Oral QHS PRN Jenne Campus, MD   25 mg at 12/11/14 2221  . lamoTRIgine (LAMICTAL) tablet 200 mg  200 mg Oral QHS Laverle Hobby, PA-C   200 mg at 12/11/14 2221  . lithium carbonate (ESKALITH) CR tablet 450 mg  450 mg Oral Daily Jenne Campus, MD   450 mg at 12/12/14 0831  . nicotine polacrilex (NICORETTE) gum 2 mg  2 mg Oral PRN Jenne Campus, MD   2 mg at 12/04/14 1320  . sodium chloride tablet 2 g  2 g Oral TID WC Charlynne Cousins, MD   2 g at 12/12/14 1254  . thyroid (ARMOUR) tablet 120 mg  120 mg Oral QAC breakfast Laverle Hobby, PA-C   120 mg at 12/12/14 5427  . topiramate (TOPAMAX) tablet 100 mg  100 mg Oral BID Laverle Hobby, PA-C   100 mg at 12/12/14 0831  . venlafaxine XR (EFFEXOR-XR) 24 hr capsule 150 mg  150 mg Oral Q breakfast Jenne Campus, MD   150 mg at 12/12/14 0831    Lab Results:  Results for orders placed or performed during the hospital encounter of 12/02/14  (from the past 48 hour(s))  Basic metabolic panel     Status: Abnormal   Collection Time: 12/10/14  7:58 PM  Result Value Ref Range   Sodium 137 135 - 145 mmol/L   Potassium 4.5 3.5 - 5.1 mmol/L   Chloride 108 101 - 111 mmol/L   CO2 25 22 - 32 mmol/L   Glucose, Bld 95 65 - 99 mg/dL   BUN 15 6 - 20 mg/dL   Creatinine, Ser 0.55 0.44 - 1.00 mg/dL   Calcium 9.2 8.9 - 10.3 mg/dL   GFR calc non Af Amer >60 >60 mL/min   GFR calc Af Amer >60 >60 mL/min    Comment: (NOTE) The eGFR has been calculated using the CKD EPI equation. This calculation has not been validated in all clinical situations. eGFR's persistently <60 mL/min signify possible Chronic Kidney Disease.    Anion gap 4 (L) 5 - 15    Comment: Performed at [facility]    Physical Findings: AIMS: Facial and Oral Movements Muscles of Facial Expression: None, normal Lips and Perioral Area: None, normal Jaw: None, normal Tongue: None, normal,Extremity Movements Upper (arms, wrists, hands, fingers): None, normal Lower (legs, knees, ankles, toes): None, normal, Trunk Movements Neck, shoulders, hips: None, normal, Overall Severity Severity of abnormal movements (highest score from questions above): None, normal Incapacitation due to abnormal movements: None, normal Patient's awareness of abnormal movements (rate only patient's report): No Awareness, Dental Status Current problems with teeth and/or dentures?: No Does patient usually wear dentures?: No  CIWA:  CIWA-Ar Total: 1 COWS:  COWS Total Score: 2   Assessment -  Ongoing severe depressive symptoms- sadness, psychomotor retardation, social avoidance, but denies SI, contracts for safety, denies hallucinations, no psychotic symptoms, and  Remains optimistic that medications/ Effexor XR - will start working soon. She has presented with low blood pressure and orthostasis, due to which we have stopped Trazodone , Latuda, and she is now being followed by hospitalist  service .  Clinical symptoms of  orthostasis have been mild. Patient is drinking fluids and her electrolytes /  BUN/Cr were normal .         Treatment Plan Summary: Daily contact with patient to assess and evaluate symptoms and progress in treatment, Medication management, Plan inpatient admission and medications as below  Continue Topamax 100 mgr BID for migraine prophylaxis and for mood disorder  Continue to Encourage PO fluids . Sodium chloride supplement added by hospitalist to address low BP.  Continue Lithium 450  mgrs QDAY  For mood disorder   Continue Effexor XR  150 mgrs QDAY for depression  Continue Lamictal 200 mgrs QDAY for mood disorder  Encourage ongoing group, milieu participation to work on stressors, coping skills, enhance mood  - patient agrees to attend more groups today Continue Armour Thyroid for Hypothyroidism. Labs pending, to include cortisol serum level.    Neita Garnet, MD 12/12/2014, 1:55 PM

## 2014-12-12 NOTE — Progress Notes (Signed)
Pt has been in bed this morning with reports of feeling "light headed" when standing. MD aware and pt has been evaluated by internal medicine consult. Discussed fall safety with pt and encouraged fluids. Took pitcher of water, Gatorade and ensure to pt. Pt is compliant with drinking fluids. Pt reports that her stressors are home and work where she works in [employer]. Pt denies si and hi. Safety maintained on the unit.

## 2014-12-13 DIAGNOSIS — F319 Bipolar disorder, unspecified: Secondary | ICD-10-CM | POA: Insufficient documentation

## 2014-12-13 LAB — CORTISOL: CORTISOL PLASMA: 6.8 ug/dL

## 2014-12-13 LAB — BASIC METABOLIC PANEL
Anion gap: 5 (ref 5–15)
BUN: 15 mg/dL (ref 6–20)
CALCIUM: 9.6 mg/dL (ref 8.9–10.3)
CO2: 23 mmol/L (ref 22–32)
CREATININE: 0.71 mg/dL (ref 0.44–1.00)
Chloride: 110 mmol/L (ref 101–111)
GFR calc Af Amer: >60 mL/min (ref >60)
GLUCOSE: 91 mg/dL (ref 65–99)
Potassium: 4.1 mmol/L (ref 3.5–5.1)
SODIUM: 138 mmol/L (ref 135–145)

## 2014-12-13 MED ORDER — HYDROCORTISONE NA SUCCINATE PF 100 MG IJ SOLR
50.0000 mg | Freq: Once | INTRAMUSCULAR | Status: DC
Start: 2014-12-13 — End: 2014-12-13

## 2014-12-13 MED ORDER — VENLAFAXINE HCL ER 37.5 MG PO CP24
187.5000 mg | ORAL_CAPSULE | Freq: Every day | ORAL | Status: DC
  Administered 2014-12-14 – 2014-12-17 (×4): 187.5 mg via ORAL
  Filled 2014-12-13 (×5): qty 1

## 2014-12-13 MED ORDER — HYDROCORTISONE 20 MG PO TABS
20.0000 mg | ORAL_TABLET | Freq: Two times a day (BID) | ORAL | Status: DC
Start: 2014-12-13 — End: 2014-12-15
  Administered 2014-12-13 – 2014-12-15 (×4): 20 mg via ORAL
  Filled 2014-12-13 (×6): qty 1

## 2014-12-13 MED ORDER — LITHIUM CARBONATE ER 300 MG PO TBCR
300.0000 mg | EXTENDED_RELEASE_TABLET | Freq: Two times a day (BID) | ORAL | Status: DC
  Administered 2014-12-14 – 2014-12-17 (×8): 300 mg via ORAL
  Filled 2014-12-13 (×11): qty 1

## 2014-12-13 MED ORDER — HYDROXYZINE HCL 25 MG PO TABS
25.0000 mg | ORAL_TABLET | Freq: Every evening | ORAL | Status: DC | PRN
  Administered 2014-12-13 – 2014-12-21 (×10): 25 mg via ORAL
  Filled 2014-12-13 (×12): qty 1

## 2014-12-13 MED ORDER — COSYNTROPIN 0.25 MG IJ SOLR: 0.2500 mg | Freq: Once | INTRAMUSCULAR | Status: DC

## 2014-12-13 NOTE — [facility] Group Notes (Signed)
[facility] Group Notes: (Clinical Social Work)   12/13/2014      Type of Therapy:  Group Therapy   Participation Level:  Did Not Attend despite MHT prompting   Myrah Strawderman Grossman-Orr, LCSW 12/13/2014, 5:11 PM     

## 2014-12-13 NOTE — Progress Notes (Signed)
The focus of this group is to help patients review their daily goal of treatment and discuss progress on daily workbooks. Pt attended the evening group session and responded to all discussion prompts from the Writer. Pt shared that she had a good day on the unit, the highlight of which was getting to talk with her children. Tina [name]'s peers praised her for making it to group and not isolating in her room, though she appeared uncomfortable being addressed in this way. Pt requested for staff to help her retrieve an address from her cell phone that she required to pay her rent, which she was assisted in doing following group. Pt's affect was flat.

## 2014-12-13 NOTE — Progress Notes (Addendum)
[facility] PROGRESS NOTE    Progress Note   Tina [name] ZOX:[id number] DOB: 08/14/1970 DOA: 12/02/2014 PCP: Elie Goody, NP   Brief Narrative:   Tina [name] is an 44 y.o. female past medical history of depression chronic blood blood pressure currently being managed for depression at [facility] age were consulted for orthostatic hypotension  Assessment/Plan:   Principal Problem:   Bipolar I disorder, current or most recent episode depressed, in partial remission Active Problems:   Bipolar affective disorder, current episode severe   Orthostatic hypotension   Bipolar disorder, current episode depressed, severe, without psychotic features  continue further management per psychiatrist  Orthostatic hypotension: Cortisol level borderline low. Check cosyntropin test. She is on on trazodone and Prozac which are known to cause orthostatic hypotension. Here in the hospital she is on Effexor which has SSRI property like activity. Continue to hydrate her orally, she should drink more than 3 L orally and have recheck orthostatic vitals in the morning. Start hydrocortisone 20 mg BID. Recheck orthostatics.    Family Communication: none Disposition Plan: Home when stable. Code Status:     Code Status Orders        Start     Ordered   12/02/14 0212  Full code   Continuous     12/02/14 0211        IV Access:    Peripheral IV   Procedures and diagnostic studies:   No results found.   Medical Consultants:    None.  Anti-Infectives:   Anti-infectives    None      Subjective:    Tina [name] She relates she is still dizzy upon standing.  Objective:    Filed Vitals:   12/12/14 1119 12/12/14 1120 12/13/14 0620 12/13/14 0621  BP: 100/52 78/50 83/59  78/44  Pulse: 95 115 104 107  Temp:   98.3 F (36.8 C)   TempSrc:   Oral   Resp:   18   Height:      Weight:      SpO2:       No intake or output data in the 24 hours ending 12/13/14 1259 Filed  Weights   12/02/14 0146 12/08/14 2311  Weight: 45.36 kg (100 lb) 49.442 kg (109 lb)    Exam: Gen:  NAD Cardiovascular:  RRR, No M/R/G Respiratory:  Lungs CTAB Gastrointestinal:  Abdomen soft, NT/ND, + BS Extremities:  No C/E/C   Data Reviewed:    Labs: Basic Metabolic Panel:  Recent Labs Lab 12/10/14 1958 12/12/14 1932 12/13/14 0622  NA 137 136 138  K 4.5 4.3 4.1  CL 108 106 110  CO2 GLUCOSE 95 90 91  BUN CREATININE 0.55 0.65 0.71  CALCIUM 9.2 9.5 9.6   GFR CrCl cannot be calculated (Unknown ideal weight.). Liver Function Tests: No results for input(s): AST, ALT, ALKPHOS, BILITOT, PROT, ALBUMIN in the last 168 hours. No results for input(s): LIPASE, AMYLASE in the last 168 hours. No results for input(s): AMMONIA in the last 168 hours. Coagulation profile No results for input(s): INR, PROTIME in the last 168 hours.  CBC:  Recent Labs Lab 12/12/14 1932  WBC 7.2  HGB 13.7  HCT 38.8  MCV 92.2  PLT 301   Cardiac Enzymes: No results for input(s): CKTOTAL, CKMB, CKMBINDEX, TROPONINI in the last 168 hours. BNP (last 3 results) No results for input(s): PROBNP in the last 8760 hours. CBG: No results for input(s): GLUCAP in the last  168 hours. D-Dimer: No results for input(s): DDIMER in the last 72 hours. Hgb A1c: No results for input(s): HGBA1C in the last 72 hours. Lipid Profile: No results for input(s): CHOL, HDL, LDLCALC, TRIG, CHOLHDL, LDLDIRECT in the last 72 hours. Thyroid function studies: No results for input(s): TSH, T4TOTAL, T3FREE, THYROIDAB in the last 72 hours.  Invalid input(s): FREET3 Anemia work up: No results for input(s): VITAMINB12, FOLATE, FERRITIN, TIBC, IRON, RETICCTPCT in the last 72 hours. Sepsis Labs:  Recent Labs Lab 12/12/14 1932  WBC 7.2   Microbiology No results found for this or any previous visit (from the past 240 hour(s)).   Medications:   . feeding supplement (ENSURE ENLIVE)  237 mL Oral  BID BM  . hydrocortisone  20 mg Oral BID  . hydrocortisone sod succinate (SOLU-CORTEF) inj  50 mg Intravenous Once  . lamoTRIgine  200 mg Oral QHS  . lithium carbonate  450 mg Oral Daily  . sodium chloride  2 g Oral TID WC  . thyroid  120 mg Oral QAC breakfast  . topiramate  100 mg Oral BID  . venlafaxine XR  150 mg Oral Q breakfast   Continuous Infusions:   Time spent: 25 min   LOS: 11 days   Marinda Elk  [facility] Pager [phone]  *Please refer to [email or web address], password [email or web address] to get updated schedule on who will round on this patient, as hospitalists switch teams weekly. If 7PM-7AM, please contact night-coverage at [email or web address], password [email or web address] for any overnight needs.  12/13/2014, 12:59 PM

## 2014-12-13 NOTE — Progress Notes (Signed)
Patient ID: Tina [name], female   DOB: Jul 30, 1970, 44 y.o.   MRN: [id number] [facility] MD Progress Note  12/13/2014 1:52 PM Tina [name]  MRN:  [id number] Subjective:  Although continues to report feeling sad, depressed, states that she is " feeling a little better today", and reports she feels Effexor XR may be " starting to work a little bit ". She has had low blood pressure and orthostasis, and reports lightheadedness initially  upon standing. Denies falling or syncopal symptoms. She denies chest pain, dyspnea . She denies medication side effects.    Objective: Pt seen and chart reviewed. Has remained isolative in room. Today there is some improvement . As noted, she states she feels a little better . She is also smiling at times appropriately and seems somewhat more engaged and interactive today. She does remain, however, depressed, restricted in affect, and still exhibiting some degree of psychomotor retardation. Appreciate hospitalist follow ups- she has been started on  Hydrocortisone to address relatively low cortisol serum level. She denies medication side effects  From Li or Effexor XR  No disruptive or agitated behaviors on unit . She denies any psychotic symptoms and does not present with any psychosis at this time. She is drinking fluids liberally , as per nursing staff. Appetite is fair, but has had at least part of her meals.  Of note, denies any SI. Labs reviewed- Cortisol level low , li level 0.44 ( low ) . BMP/electrolytes, CBC unremarkable. UDS negative .   Principal Problem: Bipolar I disorder, current or most recent episode depressed, in partial remission ([place]) Diagnosis:   Patient Active Problem List   Diagnosis Date Noted  . Orthostatic hypotension [I95.1] 12/12/2014  . Bipolar disorder, current episode depressed, severe, without psychotic features [F31.4]   . Bipolar affective disorder, current episode severe [F31.9] 12/02/2014  . Bipolar I disorder, current or most  recent episode depressed, in partial remission [F31.75] 07/11/2014  . Bipolar 1 disorder, mixed [F31.60] 06/27/2014  . Disease of thyroid gland [E07.9] 06/27/2014  . Anxiety [F41.9] 06/27/2014  . 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 Bipolar 1 disorder, depressed, partial remission [F31.75] 06/27/2014  . 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 Alcohol use disorder, severe, dependence [F10.20] 06/24/2014  . Bipolar disorder, unspecified [F31.9] 02/16/2013  . Adrenal insufficiency [E27.40]   . Acne [L70.9]   . Cellulitis [L03.90]   . Hx of Lyme disease [Z86.19]   . Headache [R51]   . 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 Bipolar I disorder, most recent episode (or current) manic [F31.10] 08/29/2012  . Unspecified hypothyroidism [E03.9] 12/23/2010  . Myalgia [M79.1] 12/23/2010  . Fatigue [R53.83] 12/23/2010  . Unspecified vitamin deficiency [E56.9] 12/23/2010  . Depression [F32.9]    Total Time spent with patient: 25 minutes  Past Medical History:  Past Medical History  Diagnosis Date  . History of chicken pox   . Hypothyroidism   . Genital warts   . Depression   . Bipolar 2 disorder   . Adrenal insufficiency   . Acne   . Cellulitis   . Hx of Lyme disease   . Headache     Past Surgical History  Procedure Laterality Date  . Tonsillectomy    . Cesarean section  2003, 2007, 2011  . Umbilical hernia repair    . Inguinal hernia repair Bilateral    Family History:  Family History  Problem Relation Age of Onset  .  Heart disease Other     Grandparent  . Alcohol abuse Paternal Grandmother    Social History:  History  Alcohol Use  . 7.2 - 10.8 oz/week  . 2-8 Cans of beer, 10 Shots of liquor per week    Comment: social drinker-few drinks a week      History  Drug Use No    Social History   Social History  . Marital Status: Married    Spouse Name: N/A  . Number of Children: N/A   . Years of Education: 16   Occupational History  . HOMEMAKER    Social History Main Topics  . Smoking status: Current Every Day Smoker -- 1.50 packs/day for 29 years    Types: Cigarettes  . Smokeless tobacco: None  . Alcohol Use: 7.2 - 10.8 oz/week    2-8 Cans of beer, 10 Shots of liquor per week     Comment: social drinker-few drinks a week   . Drug Use: No  . Sexual Activity: Not Currently   Other Topics Concern  . None   Social History Narrative   Regular exercise-yes   Additional History:    Sleep: good   Appetite: improving - weight has increased and she has gained 9 lbs since admission- current BMI 21.5    Musculoskeletal: Strength & Muscle Tone: within normal limits Gait & Station: walks slowly .  Patient leans: N/A  Psychiatric Specialty Exam: Physical Exam  Review of Systems  Psychiatric/Behavioral: Positive for depression. Negative for suicidal ideas and hallucinations. The patient is nervous/anxious.   All other systems reviewed and are negative.  no chest pain, no shortness of breath, no  Nausea ,  No vomiting,  Reports mild dizziness when she first stands up . No falls .   Blood pressure 78/44, pulse 107, temperature 98.3 F (36.8 C), temperature source Oral, resp. rate 18, height 5" (0.127 m), weight 109 lb (49.442 kg), SpO2 100 %.Body mass index is 3,065.41 kg/(m^2).  General Appearance: improved grooming   Eye Contact::  Good  Speech:  Normal Rate  Volume:  Decreased  Mood:  Depressed, but states she is feeling subjectively , partially better  Affect:    Less severely constricted, a little more reactive today  Thought Process:  Goal Directed and Linear  Orientation:  Full (Time, Place, and Person)  Thought Content:  denies hallucinations, no delusions , not internally preoccupied    Suicidal Thoughts:  No denies any  SI, denies any plan or intention of hurting self on unit and contracts for safety   Homicidal Thoughts:  No  Memory:  recent and  remote grossly intact   Judgement:  Fair  Insight:  Present  Psychomotor Activity:   decreased - stays in room most of the time. Today did walk the hallway with writer, after some encouragement from staff/ myself for her to get out of bed .   Concentration:  Good  Recall:  Good  Fund of Knowledge:Good  Language: Good  Akathisia:  Negative  Handed:  Right  AIMS (if indicated):     Assets:  Desire for Improvement Housing Resilience Vocational/Educational  ADL's:   Improved   Cognition: WNL  Sleep:  Number of Hours: 5.75     Current Medications: Current Facility-Administered Medications  Medication Dose Route Frequency Provider Last Rate Last Dose  . acetaminophen (TYLENOL) tablet 650 mg  650 mg Oral Q6H PRN Laverle Hobby, PA-C      . alum & mag hydroxide-simeth (MAALOX/MYLANTA) 200-200-20  MG/5ML suspension 30 mL  30 mL Oral Q4H PRN Laverle Hobby, PA-C      . feeding supplement (ENSURE ENLIVE) (ENSURE ENLIVE) liquid 237 mL  237 mL Oral BID BM Myer Peer Cobos, MD   237 mL at 12/13/14 1322  . hydrocortisone (CORTEF) tablet 20 mg  20 mg Oral BID Charlynne Cousins, MD      . hydrOXYzine (ATARAX/VISTARIL) tablet 25 mg  25 mg Oral QHS PRN Harriet Butte, NP      . lamoTRIgine (LAMICTAL) tablet 200 mg  200 mg Oral QHS Laverle Hobby, PA-C   200 mg at 12/12/14 2132  . lithium carbonate (ESKALITH) CR tablet 450 mg  450 mg Oral Daily Jenne Campus, MD   450 mg at 12/13/14 1322  . nicotine polacrilex (NICORETTE) gum 2 mg  2 mg Oral PRN Jenne Campus, MD   2 mg at 12/04/14 1320  . sodium chloride tablet 2 g  2 g Oral TID WC Charlynne Cousins, MD   2 g at 12/13/14 1322  . thyroid (ARMOUR) tablet 120 mg  120 mg Oral QAC breakfast Laverle Hobby, PA-C   120 mg at 12/13/14 0631  . topiramate (TOPAMAX) tablet 100 mg  100 mg Oral BID Laverle Hobby, PA-C   100 mg at 12/13/14 1322  . venlafaxine XR (EFFEXOR-XR) 24 hr capsule 150 mg  150 mg Oral Q breakfast Jenne Campus, MD   150  mg at 12/13/14 1322    Lab Results:  Results for orders placed or performed during the hospital encounter of 12/02/14 (from the past 48 hour(s))  Urine rapid drug screen (hosp performed)     Status: None   Collection Time: 12/12/14  1:11 PM  Result Value Ref Range   Opiates NONE DETECTED NONE DETECTED   Cocaine NONE DETECTED NONE DETECTED   Benzodiazepines NONE DETECTED NONE DETECTED   Amphetamines NONE DETECTED NONE DETECTED   Tetrahydrocannabinol NONE DETECTED NONE DETECTED   Barbiturates NONE DETECTED NONE DETECTED    Comment:        DRUG SCREEN FOR MEDICAL PURPOSES ONLY.  IF CONFIRMATION IS NEEDED FOR ANY PURPOSE, NOTIFY LAB WITHIN 5 DAYS.        LOWEST DETECTABLE LIMITS FOR URINE DRUG SCREEN Drug Class       Cutoff (ng/mL) Amphetamine      1000 Barbiturate      200 Benzodiazepine   540 Tricyclics       981 Opiates          300 Cocaine          300 THC              50 Performed at [facility]   Urinalysis, Routine w reflex microscopic (not at [facility])     Status: None   Collection Time: 12/12/14  1:11 PM  Result Value Ref Range   Color, Urine YELLOW YELLOW   APPearance CLEAR CLEAR   Specific Gravity, Urine 1.005 1.005 - 1.030   pH 7.5 5.0 - 8.0   Glucose, UA NEGATIVE NEGATIVE mg/dL   Hgb urine dipstick NEGATIVE NEGATIVE   Bilirubin Urine NEGATIVE NEGATIVE   Ketones, ur NEGATIVE NEGATIVE mg/dL   Protein, ur NEGATIVE NEGATIVE mg/dL   Urobilinogen, UA 0.2 0.0 - 1.0 mg/dL   Nitrite NEGATIVE NEGATIVE   Leukocytes, UA NEGATIVE NEGATIVE    Comment: MICROSCOPIC NOT DONE ON URINES WITH NEGATIVE PROTEIN, BLOOD, LEUKOCYTES, NITRITE, OR GLUCOSE <1000  mg/dL. Performed at [facility]   Pregnancy, urine     Status: None   Collection Time: 12/12/14  1:11 PM  Result Value Ref Range   Preg Test, Ur NEGATIVE NEGATIVE    Comment:        THE SENSITIVITY OF THIS METHODOLOGY IS >20 mIU/mL. Performed at [facility]   CBC      Status: None   Collection Time: 12/12/14  7:32 PM  Result Value Ref Range   WBC 7.2 4.0 - 10.5 K/uL   RBC 4.21 3.87 - 5.11 MIL/uL   Hemoglobin 13.7 12.0 - 15.0 g/dL   HCT 38.8 36.0 - 46.0 %   MCV 92.2 78.0 - 100.0 fL   MCH 32.5 26.0 - 34.0 pg   MCHC 35.3 30.0 - 36.0 g/dL   RDW 12.7 11.5 - 15.5 %   Platelets 301 150 - 400 K/uL    Comment: Performed at [place] metabolic panel     Status: None   Collection Time: 12/12/14  7:32 PM  Result Value Ref Range   Sodium 136 135 - 145 mmol/L   Potassium 4.3 3.5 - 5.1 mmol/L   Chloride 106 101 - 111 mmol/L   CO2 24 22 - 32 mmol/L   Glucose, Bld 90 65 - 99 mg/dL   BUN 19 6 - 20 mg/dL   Creatinine, Ser 0.65 0.44 - 1.00 mg/dL   Calcium 9.5 8.9 - 10.3 mg/dL   GFR calc non Af Amer >60 >60 mL/min   GFR calc Af Amer >60 >60 mL/min    Comment: (NOTE) The eGFR has been calculated using the CKD EPI equation. This calculation has not been validated in all clinical situations. eGFR's persistently <60 mL/min signify possible Chronic Kidney Disease.    Anion gap 6 5 - 15    Comment: Performed at [facility]  Lithium level     Status: Abnormal   Collection Time: 12/12/14  7:32 PM  Result Value Ref Range   Lithium Lvl 0.44 (L) 0.60 - 1.20 mmol/L    Comment: Performed at [place] metabolic panel     Status: None   Collection Time: 12/13/14  6:22 AM  Result Value Ref Range   Sodium 138 135 - 145 mmol/L   Potassium 4.1 3.5 - 5.1 mmol/L   Chloride 110 101 - 111 mmol/L   CO2 23 22 - 32 mmol/L   Glucose, Bld 91 65 - 99 mg/dL   BUN 15 6 - 20 mg/dL   Creatinine, Ser 0.71 0.44 - 1.00 mg/dL   Calcium 9.6 8.9 - 10.3 mg/dL   GFR calc non Af Amer >60 >60 mL/min   GFR calc Af Amer >60 >60 mL/min    Comment: (NOTE) The eGFR has been calculated using the CKD EPI equation. This calculation has not been validated in all clinical situations. eGFR's persistently <60 mL/min signify  possible Chronic Kidney Disease.    Anion gap 5 5 - 15    Comment: Performed at [facility]  Cortisol     Status: None   Collection Time: 12/13/14  6:22 AM  Result Value Ref Range   Cortisol, Plasma 6.8 ug/dL    Comment: (NOTE) AM    6.7 - 22.6 ug/dL PM   <10.0       ug/dL Performed at [facility]     Physical Findings: AIMS: Facial and Oral Movements Muscles  of Facial Expression: None, normal Lips and Perioral Area: None, normal Jaw: None, normal Tongue: None, normal,Extremity Movements Upper (arms, wrists, hands, fingers): None, normal Lower (legs, knees, ankles, toes): None, normal, Trunk Movements Neck, shoulders, hips: None, normal, Overall Severity Severity of abnormal movements (highest score from questions above): None, normal Incapacitation due to abnormal movements: None, normal Patient's awareness of abnormal movements (rate only patient's report): No Awareness, Dental Status Current problems with teeth and/or dentures?: No Does patient usually wear dentures?: No  CIWA:  CIWA-Ar Total: 1 COWS:  COWS Total Score: 2   Assessment - Still presenting with severe depression, social isolation, some psychomotor retardation, but today states she is feeling better, feels medications are starting to work better for her , and presents with a  Slightly more reactive affect. Orhtostatic Hypotension has been ongoing and Hospitalist Service is following . Cortisol found to be low , so she has been started on Cortef.  Patient feels Effexor XR has been well tolerated and is starting to help, wants to continue/titrate dose further. So far, tolerating Lithium well.          Treatment Plan Summary: Daily contact with patient to assess and evaluate symptoms and progress in treatment, Medication management, Plan inpatient admission and medications as below  Continue Topamax 100 mgr BID for migraine prophylaxis and for mood disorder  Continue to Encourage  PO fluids . Sodium chloride supplement added by hospitalist to address low BP.  Increase Lithium to 300  mgrs  BID   For mood disorder   Increase  Effexor XR  To 187.5  mgrs QDAY for  Ongoing depression ( prefer slow  Dose titration to minimize side effects)  Continue Lamictal 200 mgrs QDAY for mood disorder  Encourage ongoing group, milieu participation to work on stressors, coping skills, enhance mood  - patient agrees to attend more groups today Continue Armour Thyroid for Hypothyroidism. Appreciate Hospitalist management / care .    Neita Garnet, MD 12/13/2014, 1:52 PM

## 2014-12-13 NOTE — [facility] Group Notes (Signed)
[facility] Group Notes:  (Nursing/MHT/Case Management/Adjunct)  Date:  12/13/2014  Time:  11:31 AM  Type of Therapy:  Psychoeducational Skills  Participation Level:  Did Not Attend  Participation Quality:  Did Not Attend  Affect:  Did Not Attend  Cognitive:  Did Not Attend  Insight:  None  Engagement in Group:  Did Not Attend  Modes of Intervention:  Did Not Attend  Summary of Progress/Problems: Pt did not attend healthy coping skills group.   Jacquelyne Balint Shanta 12/13/2014, 11:31 AM

## 2014-12-13 NOTE — [facility] Group Notes (Signed)
[facility] Group Notes:  (Nursing/MHT/Case Management/Adjunct)  Date:  12/13/2014  Time:  9:24 AM  Type of Therapy:  Psychoeducational Skills  Participation Level:  Did Not Attend  Participation Quality:  Did Not Attend  Affect:  Did Not Attend  Cognitive:  Did Not Attend  Insight:  None  Engagement in Group:  Did Not Attend  Modes of Intervention:  Did Not Attend  Summary of Progress/Problems: Pt did not attend patient self inventory group.   Jacquelyne Balint Shanta 12/13/2014, 9:24 AM

## 2014-12-13 NOTE — Progress Notes (Signed)
Patient ID: Tina [name], female   DOB: 1970-09-27, 44 y.o.   MRN: [id number]   D: Pt has been very flat and depressed on the unit today, she was in the bed most of the morning. Pts blood pressure continues to be low, Dr. Jama Flavors made aware. New order was noted to do EKG, EKG done and give to Dr. Jama Flavors, no new orders noted. Pt reported that her depression was a 5, her hopelessness was a 5, and her anxiety was a 0. Pt reported that her goal for today was to try to stay out of the bed. Pt reported being negative SI/HI, no AH/VH noted. A: 15 min checks continued for patient safety. R: Pt safety maintained.

## 2014-12-14 DIAGNOSIS — F3113 Bipolar disorder, current episode manic without psychotic features, severe: Secondary | ICD-10-CM

## 2014-12-14 MED ORDER — MIDODRINE HCL 2.5 MG PO TABS
2.5000 mg | ORAL_TABLET | Freq: Two times a day (BID) | ORAL | Status: DC
  Administered 2014-12-14 – 2014-12-15 (×3): 2.5 mg via ORAL
  Filled 2014-12-14 (×5): qty 1

## 2014-12-14 MED ORDER — TOPIRAMATE 25 MG PO TABS
25.0000 mg | ORAL_TABLET | Freq: Two times a day (BID) | ORAL | Status: DC
  Administered 2014-12-15 – 2014-12-22 (×15): 25 mg via ORAL
  Filled 2014-12-14 (×20): qty 1

## 2014-12-14 NOTE — Progress Notes (Signed)
Patient ID: Tina [name], female   DOB: February 06, 1971, 44 y.o.   MRN: [id number]    D: Pt has been very flat and depressed on the unit today, she was in the bed most of the morning. Pts blood pressure continues to be low, Dr. Jama Flavors made aware. Pt was started on a new medication to help increase blood pressure. Pt also reported that she did not get much sleep last night due to her roommate. Pt reported that her depression was a 5, her hopelessness was a 5, and her anxiety was a 0. Pt reported that her goal for today was to try to stay out of the bed. Pt reported being negative SI/HI, no AH/VH noted. A: 15 min checks continued for patient safety. R: Pt safety maintained.

## 2014-12-14 NOTE — [facility] Group Notes (Signed)
The focus of this group is to educate the patient on the purpose and policies of crisis stabilization and provide a format to answer questions about their admission.  The group details unit policies and expectations of patients while admitted.  Patient did not attend 0900 nurse education orientation group this morning.  Patient stayed in her room. 

## 2014-12-14 NOTE — Progress Notes (Signed)
Adult Psychoeducational Group Note  Date:  12/14/2014 Time:  9:02 PM  Group Topic/Focus:  Wrap-Up Group:   The focus of this group is to help patients review their daily goal of treatment and discuss progress on daily workbooks.  Participation Level:  Active  Participation Quality:  Appropriate and Attentive  Affect:  Appropriate  Cognitive:  Appropriate  Insight: Appropriate and Good  Engagement in Group:  Engaged  Modes of Intervention:  Education  Additional Comments:   Pt's was asked to provide whom they consider a healthy support system based on their group topic this morning. Pt consider her friend and mom to be her healthy support system.   Merlinda Frederick 12/14/2014, 9:02 PM

## 2014-12-14 NOTE — Progress Notes (Signed)
D: Pt has depressed affect and mood. Pt was in group room upon initial approach.  She reports her goal today was to "kind of get out of bed."  Pt denies SI/HI, denies hallucinations, denies pain.  Pt has been visible in milieu interacting with peers and staff appropriately.  Pt attended evening group.   A: Introduced self to pt.  Met with pt 1:1 and provided support and encouragement. Actively listened to pt.  Pt's phone was charged and pt was allowed to get an address from her phone per request.  Medications administered per order.  PRN medication administered for sleep. R: Pt is compliant with medications.  Pt verbally contracts for safety.  Will continue to monitor and assess.

## 2014-12-14 NOTE — [facility] Group Notes (Signed)
[facility] Group Notes: (Clinical Social Work)   12/14/2014      Type of Therapy:  Group Therapy   Participation Level:  Did Not Attend despite MHT prompting   Ambrose Mantle, LCSW 12/14/2014, 3:11 PM

## 2014-12-14 NOTE — Plan of Care (Signed)
Problem: Diagnosis: Increased Risk For Suicide Attempt Goal: STG-Patient Will Attend All Groups On The Unit Outcome: Progressing Pt attended evening group on 12/14/14.     

## 2014-12-14 NOTE — Progress Notes (Signed)
[facility] PROGRESS NOTE    Progress Note   Tina [name] ZOX:[id number] DOB: Oct 06, 1970 DOA: 12/02/2014 PCP: Elie Goody, NP   Brief Narrative:   Tina [name] is an 44 y.o. female past medical history of depression chronic blood blood pressure currently being managed for depression at [facility] age were consulted for orthostatic hypotension  Assessment/Plan:   Principal Problem:   Bipolar I disorder, current or most recent episode depressed, in partial remission ([facility]) Active Problems:   Bipolar affective disorder, current episode severe ([facility])   Orthostatic hypotension   Bipolar disorder, current episode depressed, severe, without psychotic features ([facility])   Affective psychosis, bipolar ([facility])  continue further management per psychiatrist  Orthostatic hypotension: Cortisol level borderline low. Will d/c steroids.Cosyntropin test could not be perform, can be done as an outpatient, will need endocrinology follow up.  MDD medication can cause orthostatic hypotension. whcihc can be contrinuting to her picture. Continue to hydrate her orally, she should drink more than 3 L orally. orthostatic positive, MDD decreased, will start midodrine.   Family Communication: none Disposition Plan: Home per psyq Code Status:     Code Status Orders        Start     Ordered   12/02/14 0212  Full code   Continuous     12/02/14 0211        IV Access:    Peripheral IV   Procedures and diagnostic studies:   No results found.   Medical Consultants:    None.  Anti-Infectives:   Anti-infectives    None      Subjective:    Tina [name] She relates her dizzyness upon standing is slightly improved  Objective:    Filed Vitals:   12/13/14 0620 12/13/14 0621 12/14/14 0619 12/14/14 0620  BP: 83/59 78/44 108/72 82/65  Pulse: 104 107 71 91  Temp: 98.3 F (36.8 C)  97.5 F (36.4 C)   TempSrc: Oral     Resp: 18  14   Height:      Weight:      SpO2:       No  intake or output data in the 24 hours ending 12/14/14 1004 Filed Weights   12/02/14 0146 12/08/14 2311  Weight: 45.36 kg (100 lb) 49.442 kg (109 lb)    Exam: Gen:  NAD Cardiovascular:  RRR, No M/R/G Respiratory:  Lungs CTAB Gastrointestinal:  Abdomen soft, NT/ND, + BS Extremities:  No C/E/C   Data Reviewed:    Labs: Basic Metabolic Panel:  Recent Labs Lab 12/10/14 1958 12/12/14 1932 12/13/14 0622  NA 137 136 138  K 4.5 4.3 4.1  CL 108 106 110  CO2 GLUCOSE 95 90 91  BUN CREATININE 0.55 0.65 0.71  CALCIUM 9.2 9.5 9.6   GFR CrCl cannot be calculated (Unknown ideal weight.). Liver Function Tests: No results for input(s): AST, ALT, ALKPHOS, BILITOT, PROT, ALBUMIN in the last 168 hours. No results for input(s): LIPASE, AMYLASE in the last 168 hours. No results for input(s): AMMONIA in the last 168 hours. Coagulation profile No results for input(s): INR, PROTIME in the last 168 hours.  CBC:  Recent Labs Lab 12/12/14 1932  WBC 7.2  HGB 13.7  HCT 38.8  MCV 92.2  PLT 301   Cardiac Enzymes: No results for input(s): CKTOTAL, CKMB, CKMBINDEX, TROPONINI in the last 168 hours. BNP (last 3 results) No results for input(s): PROBNP in the last 8760 hours. CBG: No results  for input(s): GLUCAP in the last 168 hours. D-Dimer: No results for input(s): DDIMER in the last 72 hours. Hgb A1c: No results for input(s): HGBA1C in the last 72 hours. Lipid Profile: No results for input(s): CHOL, HDL, LDLCALC, TRIG, CHOLHDL, LDLDIRECT in the last 72 hours. Thyroid function studies: No results for input(s): TSH, T4TOTAL, T3FREE, THYROIDAB in the last 72 hours.  Invalid input(s): FREET3 Anemia work up: No results for input(s): VITAMINB12, FOLATE, FERRITIN, TIBC, IRON, RETICCTPCT in the last 72 hours. Sepsis Labs:  Recent Labs Lab 12/12/14 1932  WBC 7.2   Microbiology No results found for this or any previous visit (from the past 240  hour(s)).   Medications:   . feeding supplement (ENSURE ENLIVE)  237 mL Oral BID BM  . hydrocortisone  20 mg Oral BID  . lamoTRIgine  200 mg Oral QHS  . lithium carbonate  300 mg Oral Q12H  . sodium chloride  2 g Oral TID WC  . thyroid  120 mg Oral QAC breakfast  . topiramate  100 mg Oral BID  . venlafaxine XR  187.5 mg Oral Q breakfast   Continuous Infusions:   Time spent: 25 min   LOS: 12 days   Marinda Elk  [facility] Pager [phone]  *Please refer to [email or web address], password [email or web address] to get updated schedule on who will round on this patient, as hospitalists switch teams weekly. If 7PM-7AM, please contact night-coverage at [email or web address], password [email or web address] for any overnight needs.  12/14/2014, 10:04 AM

## 2014-12-14 NOTE — Progress Notes (Addendum)
D: Pt has anxious affect and mood.  When asked how her day was, pt states "I was in my room the whole day because I was feeling dizzy and light-headed."  Pt reports her goal was "to get out of bed."  Pt denies SI/HI, denies hallucinations, denies pain.  Pt has been visible in the milieu and she attended evening group.   A: Met with pt 1:1 and provided support and encouragement.  Medications administered per order.  PRN medication administered for sleep.  Educated pt on medication regimen.  PO fluids encouraged and provided.  Orthostatic vitals obtained, see flowsheet.   R: Pt is compliant with medications.  Pt verbally contracts for safety.  Will continue to monitor and assess.

## 2014-12-14 NOTE — Plan of Care (Signed)
Problem: Diagnosis: Increased Risk For Suicide Attempt Goal: STG-Patient Will Attend All Groups On The Unit Outcome: Progressing Pt attended evening group on 12/13/14.

## 2014-12-14 NOTE — Progress Notes (Addendum)
Patient ID: Tina [name], female   DOB: [birth year], 44 y.o.   MRN: [id number] [facility] MD Progress Note  12/14/2014 6:05 PM Tina [name]  MRN:  [id number] Subjective:   States she feels " a little better ".  Denies medication side effects at this time.  Objective: Pt seen and chart reviewed. Today patient presenting with some improvement- she is out of bed more, sat up in bed to talk to me, has been up for some groups, and presents with less speech latency and is more verbal today. She is being followed by Hospitalist Service for her ongoing hypotension.  Patient denies medication side effects and states she feels Effexor XR is starting to work for her. Denies hallucinations, and there is no indication of psychotic symptoms at this time. She is denying any suicidal ideations today.     Principal Problem: Bipolar I disorder, current or most recent episode depressed, in partial remission ([place]) Diagnosis:   Patient Active Problem List   Diagnosis Date Noted  . Affective psychosis, bipolar ([place]) [F31.9]   . Orthostatic hypotension [I95.1] 12/12/2014  . Bipolar disorder, current episode depressed, severe, without psychotic features ([place]) [F31.4]   . Bipolar affective disorder, current episode severe ([place]) [F31.9] 12/02/2014  . Bipolar I disorder, current or most recent episode depressed, in partial remission ([place]) [F31.75] 07/11/2014  . Bipolar 1 disorder, mixed ([place]) [F31.60] 06/27/2014  . Disease of thyroid gland [E07.9] 06/27/2014  . Anxiety [F41.9] 06/27/2014  . 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 Cannabis dependence without physiological dependence ([place]) [F12.20] 06/27/2014  . Bipolar 1 disorder, depressed, partial remission ([place]) [F31.75] 06/27/2014  . 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 Alcohol use disorder, severe, dependence ([place]) [F10.20] 06/24/2014  . Bipolar disorder, unspecified ([place]) [F31.9] 02/16/2013  . Adrenal insufficiency ([place]) [E27.40]   . Acne [L70.9]    . Cellulitis [L03.90]   . Hx of Lyme disease [Z86.19]   . Headache [R51]   . 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 Bipolar I disorder, most recent episode (or current) manic ([place]) [F31.10] 08/29/2012  . Unspecified hypothyroidism [E03.9] 12/23/2010  . Myalgia [M79.1] 12/23/2010  . Fatigue [R53.83] 12/23/2010  . Unspecified vitamin deficiency [E56.9] 12/23/2010  . Depression [F32.9]    Total Time spent with patient: 20 minutes  Past Medical History:  Past Medical History  Diagnosis Date  . History of chicken pox   . Hypothyroidism   . Genital warts   . Depression   . Bipolar 2 disorder   . Adrenal insufficiency   . Acne   . Cellulitis   . Hx of Lyme disease   . Headache     Past Surgical History  Procedure Laterality Date  . Tonsillectomy    . Cesarean section  2003, 2007, 2011  . Umbilical hernia repair    . Inguinal hernia repair Bilateral    Family History:  Family History  Problem Relation Age of Onset  . Heart disease Other     Grandparent  . Alcohol abuse Paternal Grandmother    Social History:  History  Alcohol Use  . 7.2 - 10.8 oz/week  . 2-8 Cans of beer, 10 Shots of liquor per week    Comment: social drinker-few drinks a week      History  Drug Use No    Social History   Social History  . Marital Status: Married    Spouse Name: N/A  . Number of Children: N/A  . Years of Education: 7  Occupational History  . HOMEMAKER    Social History Main Topics  . Smoking status: Current Every Day Smoker -- 1.50 packs/day for 29 years    Types: Cigarettes  . Smokeless tobacco: None  . Alcohol Use: 7.2 - 10.8 oz/week    2-8 Cans of beer, 10 Shots of liquor per week     Comment: social drinker-few drinks a week   . Drug Use: No  . Sexual Activity: Not Currently   Other Topics Concern  . None   Social History Narrative   Regular exercise-yes   Additional History:    Sleep: good   Appetite: improving - weight has increased and she has  gained 9 lbs since admission- current BMI 21.5    Musculoskeletal: Strength & Muscle Tone: within normal limits Gait & Station: walks slowly .  Patient leans: N/A  Psychiatric Specialty Exam: Physical Exam  Review of Systems  Psychiatric/Behavioral: Positive for depression. Negative for suicidal ideas and hallucinations. The patient is nervous/anxious.   All other systems reviewed and are negative.  no chest pain, no shortness of breath, no  Nausea ,  No vomiting,  Reports mild dizziness when she first stands up . No falls .   Blood pressure 82/65, pulse 91, temperature 97.5 F (36.4 C), temperature source Oral, resp. rate 14, height 5" (0.127 m), weight 109 lb (49.442 kg), SpO2 100 %.Body mass index is 3,065.41 kg/(m^2).  General Appearance: improved grooming   Eye Contact::  Good  Speech:  Normal Rate  Volume:  Decreased  Mood:   Partial improvement of her depression, which has been severe  Affect:   a little more reactive today  Thought Process:  Goal Directed and Linear  Orientation:  Full (Time, Place, and Person)  Thought Content:  denies hallucinations, no delusions , not internally preoccupied    Suicidal Thoughts:  No denies any  SI, denies any plan or intention of hurting self on unit and contracts for safety   Homicidal Thoughts:  No  Memory:  recent and remote grossly intact   Judgement:  Fair  Insight:  Present  Psychomotor Activity:  Still decreased but a little more active   Concentration:  Good  Recall:  Good  Fund of Knowledge:Good  Language: Good  Akathisia:  Negative  Handed:  Right  AIMS (if indicated):     Assets:  Desire for Improvement Housing Resilience Vocational/Educational  ADL's:   Improved   Cognition: WNL  Sleep:  Number of Hours: 5.5     Current Medications: Current Facility-Administered Medications  Medication Dose Route Frequency Provider Last Rate Last Dose  . acetaminophen (TYLENOL) tablet 650 mg  650 mg Oral Q6H PRN Laverle Hobby, PA-C      . alum & mag hydroxide-simeth (MAALOX/MYLANTA) 200-200-20 MG/5ML suspension 30 mL  30 mL Oral Q4H PRN Laverle Hobby, PA-C      . feeding supplement (ENSURE ENLIVE) (ENSURE ENLIVE) liquid 237 mL  237 mL Oral BID BM Myer Peer Latrena Benegas, MD   237 mL at 12/14/14 1221  . hydrocortisone (CORTEF) tablet 20 mg  20 mg Oral BID Charlynne Cousins, MD   20 mg at 12/14/14 1700  . hydrOXYzine (ATARAX/VISTARIL) tablet 25 mg  25 mg Oral QHS PRN Harriet Butte, NP   25 mg at 12/14/14 0310  . lamoTRIgine (LAMICTAL) tablet 200 mg  200 mg Oral QHS Laverle Hobby, PA-C   200 mg at 12/13/14 2123  . lithium carbonate (LITHOBID) CR tablet  300 mg  300 mg Oral Q12H Jenne Campus, MD   300 mg at 12/14/14 0844  . midodrine (PROAMATINE) tablet 2.5 mg  2.5 mg Oral BID WC Charlynne Cousins, MD   2.5 mg at 12/14/14 1700  . nicotine polacrilex (NICORETTE) gum 2 mg  2 mg Oral PRN Jenne Campus, MD   2 mg at 12/04/14 1320  . thyroid (ARMOUR) tablet 120 mg  120 mg Oral QAC breakfast Laverle Hobby, PA-C   120 mg at 12/14/14 1610  . topiramate (TOPAMAX) tablet 100 mg  100 mg Oral BID Laverle Hobby, PA-C   100 mg at 12/14/14 1700  . venlafaxine XR (EFFEXOR-XR) 24 hr capsule 187.5 mg  187.5 mg Oral Q breakfast Jenne Campus, MD   187.5 mg at 12/14/14 0844    Lab Results:  Results for orders placed or performed during the hospital encounter of 12/02/14 (from the past 48 hour(s))  CBC     Status: None   Collection Time: 12/12/14  7:32 PM  Result Value Ref Range   WBC 7.2 4.0 - 10.5 K/uL   RBC 4.21 3.87 - 5.11 MIL/uL   Hemoglobin 13.7 12.0 - 15.0 g/dL   HCT 38.8 36.0 - 46.0 %   MCV 92.2 78.0 - 100.0 fL   MCH 32.5 26.0 - 34.0 pg   MCHC 35.3 30.0 - 36.0 g/dL   RDW 12.7 11.5 - 15.5 %   Platelets 301 150 - 400 K/uL    Comment: Performed at [place] metabolic panel     Status: None   Collection Time: 12/12/14  7:32 PM  Result Value Ref Range   Sodium 136 135 - 145  mmol/L   Potassium 4.3 3.5 - 5.1 mmol/L   Chloride 106 101 - 111 mmol/L   CO2 24 22 - 32 mmol/L   Glucose, Bld 90 65 - 99 mg/dL   BUN 19 6 - 20 mg/dL   Creatinine, Ser 0.65 0.44 - 1.00 mg/dL   Calcium 9.5 8.9 - 10.3 mg/dL   GFR calc non Af Amer >60 >60 mL/min   GFR calc Af Amer >60 >60 mL/min    Comment: (NOTE) The eGFR has been calculated using the CKD EPI equation. This calculation has not been validated in all clinical situations. eGFR's persistently <60 mL/min signify possible Chronic Kidney Disease.    Anion gap 6 5 - 15    Comment: Performed at [facility]  Lithium level     Status: Abnormal   Collection Time: 12/12/14  7:32 PM  Result Value Ref Range   Lithium Lvl 0.44 (L) 0.60 - 1.20 mmol/L    Comment: Performed at [place] metabolic panel     Status: None   Collection Time: 12/13/14  6:22 AM  Result Value Ref Range   Sodium 138 135 - 145 mmol/L   Potassium 4.1 3.5 - 5.1 mmol/L   Chloride 110 101 - 111 mmol/L   CO2 23 22 - 32 mmol/L   Glucose, Bld 91 65 - 99 mg/dL   BUN 15 6 - 20 mg/dL   Creatinine, Ser 0.71 0.44 - 1.00 mg/dL   Calcium 9.6 8.9 - 10.3 mg/dL   GFR calc non Af Amer >60 >60 mL/min   GFR calc Af Amer >60 >60 mL/min    Comment: (NOTE) The eGFR has been calculated using the CKD EPI equation. This calculation has not been validated in  all clinical situations. eGFR's persistently <60 mL/min signify possible Chronic Kidney Disease.    Anion gap 5 5 - 15    Comment: Performed at [facility]  Cortisol     Status: None   Collection Time: 12/13/14  6:22 AM  Result Value Ref Range   Cortisol, Plasma 6.8 ug/dL    Comment: (NOTE) AM    6.7 - 22.6 ug/dL PM   <10.0       ug/dL Performed at [facility]     Physical Findings: AIMS: Facial and Oral Movements Muscles of Facial Expression: None, normal Lips and Perioral Area: None, normal Jaw: None, normal Tongue: None,  normal,Extremity Movements Upper (arms, wrists, hands, fingers): None, normal Lower (legs, knees, ankles, toes): None, normal, Trunk Movements Neck, shoulders, hips: None, normal, Overall Severity Severity of abnormal movements (highest score from questions above): None, normal Incapacitation due to abnormal movements: None, normal Patient's awareness of abnormal movements (rate only patient's report): No Awareness, Dental Status Current problems with teeth and/or dentures?: No Does patient usually wear dentures?: No  CIWA:  CIWA-Ar Total: 1 COWS:  COWS Total Score: 2   Assessment -  Severe depression, which has been associated with increased isolation, decreased verbal output, and spending a lot of time in bed, has improved partially over the last day or two, and she is now more verbal, more active. Denies SI, and is not presenting with any psychotic symptoms. She remains hypotensive, orthostatic- she is drinking fluids as instructed and electrolytes have been WNL.  She is hopeful that Effexor XR is starting to help her , and wants to continue this antidepressant trial at this time.  Although patient has been on Topamax for months, and denies side effects, it is possible that this medication may be contributing to low blood pressure and to slow mentation. She takes it for migraine/headache prophylaxis . Will taper to see if symptoms improve .   Treatment Plan Summary: Daily contact with patient to assess and evaluate symptoms and progress in treatment, Medication management, Plan inpatient admission and medications as below  Decrease  Topamax  To 25 mgr BID for migraine prophylaxis and for mood disorder - see rationale above  Continue to Encourage PO fluids . Sodium chloride supplement added by hospitalist to address low BP.  Continue Lithium  300  mgrs  BID   For mood disorder   Continue Effexor XR  To 187.5  mgrs QDAY for  Ongoing depression ( prefer slow  Dose titration to minimize side  effects)  Continue Lamictal 200 mgrs QDAY for mood disorder  Encourage ongoing group, milieu participation to work on stressors, coping skills, enhance mood  - patient agrees to attend more groups today Continue Armour Thyroid for Hypothyroidism. Appreciate Hospitalist management / care .  Now on Cortef and Proamatine to address orthostasis.    Neita Garnet, MD 12/14/2014, 6:05 PM

## 2014-12-14 NOTE — [facility] Group Notes (Signed)
[facility] Group Notes:  (Nursing/MHT/Case Management/Adjunct)  Date:  12/14/2014  Time:  11:24 AM  Type of Therapy:  Psychoeducational Skills  Participation Level:  Did Not Attend  Participation Quality:  Did Not Attend  Affect:  Did Not Attend  Cognitive:  Did Not Attend  Insight:  None  Engagement in Group:  Did Not Attend  Modes of Intervention:  Did Not Attend  Summary of Progress/Problems: Pt did not attend patient self inventory group.   Jacquelyne Balint Shanta 12/14/2014, 11:24 AM

## 2014-12-15 MED ORDER — MIDODRINE HCL 5 MG PO TABS
5.0000 mg | ORAL_TABLET | Freq: Two times a day (BID) | ORAL | Status: DC
  Administered 2014-12-15 – 2014-12-22 (×14): 5 mg via ORAL
  Filled 2014-12-15 (×18): qty 1

## 2014-12-15 MED ORDER — ZOLPIDEM TARTRATE 5 MG PO TABS
5.0000 mg | ORAL_TABLET | Freq: Every evening | ORAL | Status: DC | PRN
  Administered 2014-12-15: 5 mg via ORAL
  Filled 2014-12-15: qty 1

## 2014-12-15 NOTE — Plan of Care (Signed)
Problem: Alteration in mood Goal: STG-Patient reports thoughts of self-harm to staff Outcome: Progressing Patient denies SI during morning assessment     

## 2014-12-15 NOTE — Progress Notes (Signed)
Adult Psychoeducational Group Note  Date:  12/15/2014 Time:  8:45 PM   Group Topic/Focus:  Wrap-Up Group:   The focus of this group is to help patients review their daily goal of treatment and discuss progress on daily workbooks.  Participation Level:  Minimal  Participation Quality:  Attentive  Affect:  Depressed  Cognitive:  Appropriate  Insight: Appropriate  Engagement in Group:  Limited  Modes of Intervention:  Discussion  Additional Comments:  Pt seemed depressed during wrap-up group. Pt rated overall day a 4 out of 10 due to feeling "lightheaded" and "dizzy" for most of the day. Pt noted that her "awesome roommate" was the highlight of her day. Pt also reported that she did not have any goals for the day.   Cleotilde Neer 12/15/2014, 9:34 PM

## 2014-12-15 NOTE — [facility] Group Notes (Signed)
[facility] LCSW Group Therapy  12/15/2014 1:15pm  Type of Therapy:  Group Therapy vercoming Obstacles  Pt did not attend, declined invitation.   Chad Cordial, LCSWA 12/15/2014 3:19 PM

## 2014-12-15 NOTE — Progress Notes (Signed)
D: Patient in the dayroom on approach.  Patient states she had a good day but is still worried about her blood pressure being low.  Patient rates her depression 4/10 tonight.  Patient has been increasing fluid intake tonight.  Patient still concerned with not being able to sleep at night but per patient she has been sleeping during he day. Patient states  Patient denies SI/H and denies AVH.   A: Staff to monitor Q 15 mins for safety.  Encouragement and support offered.  Scheduled medications administered per orders.  R: Patient remains safe on the unit.  Patient attended group tonight.  Patient visible on the unit and interacting with peers.  Patient taking administered medications.

## 2014-12-15 NOTE — Progress Notes (Signed)
Patient ID: Tina [name], female   DOB: Feb 22, 1971, 44 y.o.   MRN: [id number] Patient ID: Tina [name], female   DOB: [birth year], 44 y.o.   MRN: [id number] [facility] MD Progress Note  12/15/2014 2:33 PM Tina [name]  MRN:  [id number]  Subjective:   States she feels " a little better. Still could not sleep well at night ".  Denies medication side effects at this time.  Objective: Pt seen and chart reviewed. Today patient presenting with some improvement- she is out of bed more, sat up in bed to talk to me, has been up for some groups, and presents with less speech latency and is more verbal today. She is being followed by Hospitalist Service for her ongoing hypotension. Patient denies medication side effects and states she feels Effexor XR is starting to work for her.Denies hallucinations, and there is no indication of psychotic symptoms at this time. She is denying any suicidal ideations today. Complains of feeling dizzy today.  Principal Problem: Bipolar I disorder, current or most recent episode depressed, in partial remission (HCC) Diagnosis:   Patient Active Problem List   Diagnosis Date Noted  . Affective psychosis, bipolar (HCC) [F31.9]   . Orthostatic hypotension [I95.1] 12/12/2014  . Bipolar disorder, current episode depressed, severe, without psychotic features (HCC) [F31.4]   . Bipolar affective disorder, current episode severe (HCC) [F31.9] 12/02/2014  . Bipolar I disorder, current or most recent episode depressed, in partial remission (HCC) [F31.75] 07/11/2014  . Bipolar 1 disorder, mixed (HCC) [F31.60] 06/27/2014  . Disease of thyroid gland [E07.9] 06/27/2014  . Anxiety [F41.9] 06/27/2014  . 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 Bipolar 1 disorder, depressed, partial remission (HCC) [F31.75] 06/27/2014  . 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 Alcohol use disorder, severe, dependence (HCC)  [F10.20] 06/24/2014  . Bipolar disorder, unspecified (HCC) [F31.9] 02/16/2013  . Adrenal insufficiency (HCC) [E27.40]   . Acne [L70.9]   . Cellulitis [L03.90]   . Hx of Lyme disease [Z86.19]   . Headache [R51]   . 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 Bipolar I disorder, most recent episode (or current) manic (HCC) [F31.10] 08/29/2012  . Unspecified hypothyroidism [E03.9] 12/23/2010  . Myalgia [M79.1] 12/23/2010  . Fatigue [R53.83] 12/23/2010  . Unspecified vitamin deficiency [E56.9] 12/23/2010  . Depression [F32.9]    Total Time spent with patient: 20 minutes  Past Medical History:  Past Medical History  Diagnosis Date  . History of chicken pox   . Hypothyroidism   . Genital warts   . Depression   . Bipolar 2 disorder   . Adrenal insufficiency   . Acne   . Cellulitis   . Hx of Lyme disease   . Headache     Past Surgical History  Procedure Laterality Date  . Tonsillectomy    . Cesarean section  2003, 2007, 2011  . Umbilical hernia repair    . Inguinal hernia repair Bilateral    Family History:  Family History  Problem Relation Age of Onset  . Heart disease Other     Grandparent  . Alcohol abuse Paternal Grandmother    Social History:  History  Alcohol Use  . 7.2 - 10.8 oz/week  . 2-8 Cans of beer, 10 Shots of liquor per week    Comment: social drinker-few drinks a week      History  Drug Use No    Social History   Social History  .  Marital Status: Married    Spouse Name: N/A  . Number of Children: N/A  . Years of Education: 16   Occupational History  . HOMEMAKER    Social History Main Topics  . Smoking status: Current Every Day Smoker -- 1.50 packs/day for 29 years    Types: Cigarettes  . Smokeless tobacco: None  . Alcohol Use: 7.2 - 10.8 oz/week    2-8 Cans of beer, 10 Shots of liquor per week     Comment: social drinker-few drinks a week   . Drug Use: No  . Sexual Activity: Not Currently   Other Topics Concern  . None   Social History  Narrative   Regular exercise-yes   Additional History:    Sleep: good   Appetite: improving - weight has increased and she has gained 9 lbs since admission- current BMI 21.5   Musculoskeletal: Strength & Muscle Tone: within normal limits Gait & Station: walks slowly .  Patient leans: N/A  Psychiatric Specialty Exam: Physical Exam  Review of Systems  Psychiatric/Behavioral: Positive for depression. Negative for suicidal ideas and hallucinations. The patient is nervous/anxious.   All other systems reviewed and are negative.  no chest pain, no shortness of breath, no  Nausea ,  No vomiting,  Reports mild dizziness when she first stands up . No falls .   Blood pressure 109/71, pulse 68, temperature 98.2 F (36.8 C), temperature source Oral, resp. rate 16, height 5" (0.127 m), weight 49.442 kg (109 lb), SpO2 100 %.Body mass index is 3,065.41 kg/(m^2).  General Appearance: improved grooming   Eye Contact::  Good  Speech:  Normal Rate  Volume:  Decreased  Mood: Partial improvement of her depression, which has been severe  Affect: Flat, depressed  Thought Process:  Goal Directed and Linear  Orientation:  Full (Time, Place, and Person)  Thought Content:  denies hallucinations, no delusions , not internally preoccupied    Suicidal Thoughts:  No denies any  SI, denies any plan or intention of hurting self on unit and contracts for safety   Homicidal Thoughts:  No  Memory:  recent and remote grossly intact   Judgement:  Fair  Insight:  Present  Psychomotor Activity:  Still decreased but a little more active   Concentration:  Good  Recall:  Good  Fund of Knowledge:Good  Language: Good  Akathisia:  Negative  Handed:  Right  AIMS (if indicated):     Assets:  Desire for Improvement Housing Resilience Vocational/Educational  ADL's:   Improved   Cognition: WNL  Sleep:  Number of Hours: 6.5    Current Medications: Current Facility-Administered Medications  Medication Dose Route  Frequency Provider Last Rate Last Dose  . acetaminophen (TYLENOL) tablet 650 mg  650 mg Oral Q6H PRN Kerry Hough, PA-C      . alum & mag hydroxide-simeth (MAALOX/MYLANTA) 200-200-20 MG/5ML suspension 30 mL  30 mL Oral Q4H PRN Kerry Hough, PA-C      . feeding supplement (ENSURE ENLIVE) (ENSURE ENLIVE) liquid 237 mL  237 mL Oral BID BM Rockey Situ Cobos, MD   237 mL at 12/14/14 1221  . hydrOXYzine (ATARAX/VISTARIL) tablet 25 mg  25 mg Oral QHS PRN Worthy Flank, NP   25 mg at 12/14/14 2137  . lamoTRIgine (LAMICTAL) tablet 200 mg  200 mg Oral QHS Kerry Hough, PA-C   200 mg at 12/14/14 2137  . lithium carbonate (LITHOBID) CR tablet 300 mg  300 mg Oral Q12H Fernando A  Cobos, MD   300 mg at 12/15/14 0908  . midodrine (PROAMATINE) tablet 5 mg  5 mg Oral BID WC Marinda Elk, MD      . nicotine polacrilex (NICORETTE) gum 2 mg  2 mg Oral PRN Craige Cotta, MD   2 mg at 12/04/14 1320  . thyroid (ARMOUR) tablet 120 mg  120 mg Oral QAC breakfast Kerry Hough, PA-C   120 mg at 12/15/14 0644  . topiramate (TOPAMAX) tablet 25 mg  25 mg Oral BID Craige Cotta, MD   25 mg at 12/15/14 0907  . venlafaxine XR (EFFEXOR-XR) 24 hr capsule 187.5 mg  187.5 mg Oral Q breakfast Craige Cotta, MD   187.5 mg at 12/15/14 0907    Lab Results:  No results found for this or any previous visit (from the past 48 hour(s)).  Physical Findings:  AIMS: Facial and Oral Movements Muscles of Facial Expression: None, normal Lips and Perioral Area: None, normal Jaw: None, normal Tongue: None, normal,Extremity Movements Upper (arms, wrists, hands, fingers): None, normal Lower (legs, knees, ankles, toes): None, normal, Trunk Movements Neck, shoulders, hips: None, normal, Overall Severity Severity of abnormal movements (highest score from questions above): None, normal Incapacitation due to abnormal movements: None, normal Patient's awareness of abnormal movements (rate only patient's report): No  Awareness, Dental Status Current problems with teeth and/or dentures?: No Does patient usually wear dentures?: No  CIWA:  CIWA-Ar Total: 1 COWS:  COWS Total Score: 2   Assessment -  Severe depression, which has been associated with increased isolation, decreased verbal output, and spending a lot of time in bed, has improved partially over the last day or two, and she is now more verbal, more active. Denies SI, and is not presenting with any psychotic symptoms. She remains hypotensive, orthostatic- she is drinking fluids as instructed and electrolytes have been WNL. She is hopeful that Effexor XR is starting to help her , and wants to continue this antidepressant trial at this time.  Although patient has been on Topamax for months, and denies side effects, it is possible that this medication may be contributing to low blood pressure and to slow mentation. She takes it for migraine/headache prophylaxis . Will taper to see if symptoms improve .  Treatment Plan Summary: Daily contact with patient to assess and evaluate symptoms and progress in treatment, Medication management, Plan inpatient admission and medications as below   Continue Topamax 25 mgr BID for migraine prophylaxis and for mood disorder - see rationale above   Continue to Encourage PO fluids. Sodium chloride supplement added by hospitalist to address low BP.   Continue Lithium  300  mg  BID: For mood disorder/stabilization.  Continue Effexor XR  To 187.5  mgrs QDAY for  Ongoing depression ( prefer slow  Dose titration to minimize side effects)   Continue Lamictal 200 mgrs QDAY for mood disorder   Encourage ongoing group, milieu participation to work on stressors, coping skills, enhance mood  - patient agrees to attend more groups today  Continue Armour Thyroid for Hypothyroidism. Initaite Ambien 5 mg Q hs.  Appreciate Hospitalist management/care .  Now on Cortef and Proamatine to address orthostasis.    Sanjuana Kava, PMHNP,  FNP-BC 12/15/2014, 2:33 PM I agree with assessment and plan Madie Reno A. Dub Mikes, M.D.

## 2014-12-15 NOTE — [facility] Group Notes (Signed)
[facility] LCSW Aftercare Discharge Planning Group Note  12/15/2014 8:45 AM  Pt did not attend, declined invitation.   Chad Cordial, LCSWA 12/15/2014 10:00 AM

## 2014-12-15 NOTE — Progress Notes (Addendum)
[facility] PROGRESS NOTE    Progress Note   Tina [name] UUV:[id number] DOB: 1970/08/07 DOA: 12/02/2014 PCP: Elie Goody, NP   Brief Narrative:   Tina [name] is an 44 y.o. female past medical history of depression chronic blood blood pressure currently being managed for depression at [facility], were consulted for orthostatic hypotension  Assessment/Plan:   Principal Problem:   Bipolar I disorder, current or most recent episode depressed, in partial remission (HCC) Active Problems:   Bipolar affective disorder, current episode severe (HCC)   Orthostatic hypotension   Bipolar disorder, current episode depressed, severe, without psychotic features (HCC)   Affective psychosis, bipolar (HCC)  continue further management per psychiatrist  Orthostatic hypotension: Cortisol level borderline low. Will d/c steroids. MDD medication can cause orthostatic hypotension. whihc can be contrinuting to her picture. Increase midodrine apply ted hose . Repeat orthostatics in am. Will sign off   Family Communication: none Disposition Plan: Home per psyq Code Status:     Code Status Orders        Start     Ordered   12/02/14 0212  Full code   Continuous     12/02/14 0211        IV Access:    Peripheral IV   Procedures and diagnostic studies:   No results found.   Medical Consultants:    None.  Anti-Infectives:   Anti-infectives    None      Subjective:    Tina [name] She relates her dizzyness upon standing unchanged comapre to yesterday.  Objective:    Filed Vitals:   12/13/14 0620 12/13/14 0621 12/14/14 0619 12/14/14 0620  BP: 83/59 78/44 108/72 82/65  Pulse: 104 107 71 91  Temp: 98.3 F (36.8 C)  97.5 F (36.4 C)   TempSrc: Oral     Resp: 18  14   Height:      Weight:      SpO2:       No intake or output data in the 24 hours ending 12/15/14 1006 Filed Weights   12/02/14 0146 12/08/14 2311  Weight: 45.36 kg (100 lb) 49.442 kg (109  lb)    Exam: Gen:  NAD Cardiovascular:  RRR, No M/R/G Respiratory:  Lungs CTAB Gastrointestinal:  Abdomen soft, NT/ND, + BS Extremities:  No C/E/C   Data Reviewed:    Labs: Basic Metabolic Panel:  Recent Labs Lab 12/10/14 1958 12/12/14 1932 12/13/14 0622  NA 137 136 138  K 4.5 4.3 4.1  CL 108 106 110  CO2 GLUCOSE 95 90 91  BUN CREATININE 0.55 0.65 0.71  CALCIUM 9.2 9.5 9.6   GFR CrCl cannot be calculated (Unknown ideal weight.). Liver Function Tests: No results for input(s): AST, ALT, ALKPHOS, BILITOT, PROT, ALBUMIN in the last 168 hours. No results for input(s): LIPASE, AMYLASE in the last 168 hours. No results for input(s): AMMONIA in the last 168 hours. Coagulation profile No results for input(s): INR, PROTIME in the last 168 hours.  CBC:  Recent Labs Lab 12/12/14 1932  WBC 7.2  HGB 13.7  HCT 38.8  MCV 92.2  PLT 301   Cardiac Enzymes: No results for input(s): CKTOTAL, CKMB, CKMBINDEX, TROPONINI in the last 168 hours. BNP (last 3 results) No results for input(s): PROBNP in the last 8760 hours. CBG: No results for input(s): GLUCAP in the last 168 hours. D-Dimer: No results for input(s): DDIMER in the last 72 hours. Hgb A1c: No results for  input(s): HGBA1C in the last 72 hours. Lipid Profile: No results for input(s): CHOL, HDL, LDLCALC, TRIG, CHOLHDL, LDLDIRECT in the last 72 hours. Thyroid function studies: No results for input(s): TSH, T4TOTAL, T3FREE, THYROIDAB in the last 72 hours.  Invalid input(s): FREET3 Anemia work up: No results for input(s): VITAMINB12, FOLATE, FERRITIN, TIBC, IRON, RETICCTPCT in the last 72 hours. Sepsis Labs:  Recent Labs Lab 12/12/14 1932  WBC 7.2   Microbiology No results found for this or any previous visit (from the past 240 hour(s)).   Medications:   . feeding supplement (ENSURE ENLIVE)  237 mL Oral BID BM  . lamoTRIgine  200 mg Oral QHS  . lithium carbonate  300 mg Oral Q12H  .  midodrine  5 mg Oral BID WC  . thyroid  120 mg Oral QAC breakfast  . topiramate  25 mg Oral BID  . venlafaxine XR  187.5 mg Oral Q breakfast   Continuous Infusions:   Time spent: 25 min   LOS: 13 days   Marinda Elk  [facility] Pager [phone]  *Please refer to [email or web address], password [email or web address] to get updated schedule on who will round on this patient, as hospitalists switch teams weekly. If 7PM-7AM, please contact night-coverage at [email or web address], password [email or web address] for any overnight needs.  12/15/2014, 10:06 AM

## 2014-12-15 NOTE — Progress Notes (Signed)
Patient ID: Tina [name], female   DOB: 1970/06/16, 44 y.o.   MRN: [id number]  DAR: Pt. Denies SI/HI and A/V Hallucinations. Patient does not report any pain but does report increased sensitivity to sound. Patient reports it started yesterday and any noise causes her to jump. Support and encouragement provided to the patient. Scheduled medications administered to patient per physician's orders. Orthostatic vital signs are continued and TED hose have been ordered but not yet delivered by materials at this time. Patient is receptive but remains in her room throughout most of the day. Patient encouraged to get out of bed and also to dangle feet before standing to help with circulation. Patient reports dizziness still to this Clinical research associate. Q15 minute checks are maintained for safety.

## 2014-12-16 DIAGNOSIS — F3130 Bipolar disorder, current episode depressed, mild or moderate severity, unspecified: Secondary | ICD-10-CM

## 2014-12-16 NOTE — Progress Notes (Signed)
Recreation Therapy Notes  Animal-Assisted Activity (AAA) Program Checklist/Progress Notes Patient Eligibility Criteria Checklist & Daily Group note for Rec Tx Intervention  Date: 10.04.2016 Time: 2:45pm Location: [address]    AAA/T Program Assumption of Risk Form signed by Patient/ or Parent Legal Guardian yes  Patient is free of allergies or sever asthma yes  Patient reports no fear of animals yes  Patient reports no history of cruelty to animals yes  Patient understands his/her participation is voluntary yes  Behavioral Response: Did not attend.   Marykay Lex Joleen Stuckert, LRT/CTRS  Danine Hor L 12/16/2014 3:13 PM

## 2014-12-16 NOTE — Tx Team (Signed)
Interdisciplinary Treatment Plan Update (Adult) Date: 12/09/2014   Date: 12/09/2014 8:50 AM  Progress in Treatment:  Attending groups: No Participating in groups: No Taking medication as prescribed: Yes  Tolerating medication: Yes  Family/Significant othe contact made:Yes with mother Patient understands diagnosis: Yes Discussing patient identified problems/goals with staff: Yes  Medical problems stabilized or resolved: Yes  Denies suicidal/homicidal ideation: Yes Patient has not harmed self or Others: Yes   New problem(s) identified: None identified at this time.   Discharge Plan or Barriers: Pt will return home and follow-up with Letta Moynahan and [facility]  Additional comments: n/a   Reason for Continuation of Hospitalization:  Depression Anxiety  Medication stabilization Suicidal ideation  Estimated length of stay: 3-5 days  Review of initial/current patient goals per problem list:   1.  Goal(s): Patient will participate in aftercare plan  Met:  Yes  Target date: 3-5 days from date of admission   As evidenced by: Patient will participate within aftercare plan AEB aftercare provider and housing plan at discharge being identified.   12/02/14: pt will return home and follow-up with Letta Moynahan.  2.  Goal (s): Patient will exhibit decreased depressive symptoms and suicidal ideations.  Met:  Goal progressing  Target date: 3-5 days from date of admission   As evidenced by: Patient will utilize self rating of depression at 3 or below and demonstrate decreased signs of depression or be deemed stable for discharge by MD. 12/02/14: Pt was admitted with symptoms of depression, rating 10/10. Pt continues to present with flat affect and depressive symptoms.  Pt will demonstrate decreased symptoms of depression and rate depression at 3/10 or lower prior to discharge. 12/04/14: Pt rates depression at 7/10; denies SI. 12/09/14: Pt rates depression  at 6/10; denies SI. 12/11/14: Pt rates depression at 6/10; denies SI. 12/16/14: Pt rates depression at 4/10; denies SI  3.  Goal(s): Patient will demonstrate decreased signs and symptoms of anxiety.  Met:  Yes  Target date: 3-5 days from date of admission   As evidenced by: Patient will utilize self rating of anxiety at 3 or below and demonstrated decreased signs of anxiety, or be deemed stable for discharge by MD 12/02/14: Pt was admitted with increased levels of anxiety and is currently rating those symptoms highly. Pt will demonstrated decreased symptoms of anxiety and rate it at 3/10 prior to d/c. 12/04/14: Pt rates anxiety at 4/10. 12/09/14: Pt rates anxiety at 0/10.  12/16/14: Pt rates anxiety at 0/10.  Attendees:  Patient:    Family:    Physician: Dr. Parke Poisson, MD  12/16/2014 8:50 AM  Nursing: Lars Pinks, RN Case manager  12/16/2014 8:50 AM  Clinical Social Worker Norman Clay, MSW 12/16/2014 8:50 AM  Other: Lucinda Dell, Beverly Sessions Liasion 12/16/2014 8:50 AM  Clinical: Marcella Dubs, RN 12/16/2014 8:50 AM  Other: , RN Charge Nurse 12/16/2014 8:50 AM  Other:     Peri Maris, [place] MSW

## 2014-12-16 NOTE — Progress Notes (Signed)
Patient ID: Tina [name], female   DOB: 1970/06/22, 44 y.o.   MRN: [id number]   Adult Psychoeducational Group Note  Date:  12/16/2014 Time:  09:00am  Group Topic/Focus:  Goals Group:   The focus of this group is to help patients establish daily goals to achieve during treatment and discuss how the patient can incorporate goal setting into their daily lives to aide in recovery.  Participation Level:  Did Not Attend  Participation Quality: n/a  Affect: n/a  Cognitive: n/a  Insight: n/a  Engagement in Group: n/a  Modes of Intervention:  Discussion, Education, Orientation and Support  Additional Comments:  Pt did not attend group. Pt in bed asleep.   Aurora Mask 12/16/2014, 9:34 AM

## 2014-12-16 NOTE — Progress Notes (Signed)
Adult Psychoeducational Group Note  Date:  12/16/2014 Time:  9:35 PM  Group Topic/Focus:  Wrap-Up Group:   The focus of this group is to help patients review their daily goal of treatment and discuss progress on daily workbooks.  Participation Level:  Active  Participation Quality:  Appropriate  Affect:  Appropriate  Cognitive:  Appropriate  Insight: Appropriate  Engagement in Group:  Engaged  Modes of Intervention:  Discussion  Additional Comments: The patient expressed that she attended group.The patient also said that group was about understanding diagnosis.  Octavio Manns 12/16/2014, 9:35 PM

## 2014-12-16 NOTE — Progress Notes (Signed)
Patient ID: Tina [name], female   DOB: Oct 12, 1970, 44 y.o.   MRN: [id number]  Pt currently presents with a flat affect and depressed behavior. Pt main complaint is "weird, dizzy vision." Pt reports an increase in appetite and ate yogurt for breakfast. Pt reports that her depression is stable at a rating of 4/10. Pt reports poor sleep for the last half of the night. Per self inventory, pt rates depression at a 4, hopelessness 4 and anxiety 5. Pt's daily goal is to "to stay out of bed" and they intend to do so by "stay out of bed." Pt reports fair sleep, a fair appetite, low energy and poor concentration.    Pt provided with medications per providers orders. Pt's labs and vitals were monitored throughout the day. Pt supported emotionally and encouraged to express concerns and questions. Pt educated on medications, diet/nutrition and sleep/wake patterns and the brain. Pt encouraged to remain in milieu or out of the bed as much as possible today. Pt goes to lunch today and also attends groups.   Pt's safety ensured with 15 minute and environmental checks. Pt currently denies SI/HI and A/V hallucinations. Pt verbally agrees to seek staff if SI/HI or A/VH occurs and to consult with staff before acting on these thoughts. Will continue POC.

## 2014-12-16 NOTE — [facility] Group Notes (Signed)
[facility] LCSW Group Therapy 12/16/2014 1:15 PM  Type of Therapy: Group Therapy- Feelings about Diagnosis  Participation Level: Improving   Participation Quality:  Appropriate  Affect:  Flat  Cognitive: Alert and Oriented   Insight:  Developing   Engagement in Therapy: Developing/Improving and Engaged   Modes of Intervention: Clarification, Confrontation, Discussion, Education, Exploration, Limit-setting, Orientation, Problem-solving, Rapport Building, Dance movement psychotherapist, Socialization and Support  Description of Group:   This group will allow patients to explore their thoughts and feelings about diagnoses they have received. Patients will be guided to explore their level of understanding and acceptance of these diagnoses. Facilitator will encourage patients to process their thoughts and feelings about the reactions of others to their diagnosis, and will guide patients in identifying ways to discuss their diagnosis with significant others in their lives. This group will be process-oriented, with patients participating in exploration of their own experiences as well as giving and receiving support and challenge from other group members.  Summary of Progress/Problems:  Pt participated voluntarily in group discussion today and identified frustration with her sister's understanding of depression; Pt reports that she feels that when she tries to describe her mental illness to others that it sounds like "fluff" to them- unworthy of the treatment it is given. Pt was receptive to feedback from peers and was engaged in the conversation.  Therapeutic Modalities:   Cognitive Behavioral Therapy Solution Focused Therapy Motivational Interviewing Relapse Prevention Therapy  Chad Cordial, LCSWA 12/16/2014 4:44 PM

## 2014-12-16 NOTE — Progress Notes (Signed)
D: Patient in the dayroom on approach.  Patient states she had a better day today.  Patient states she has been up more today.  Patient states she still has had some periods of dizziness.  Patient states she has been eating more today.  Patient rates depression 4/10 and states she has been anxious today and notices she is easily startled.  Patient denies SI/HI and denies AVH. A: Staff to monitor Q 15 mins for safety.  Encouragement and support offered.  Scheduled medications administered per orders.  Vistaril administered prn for sleep. R: Patient remains safe on the unit.  Patient attended group tonight.  Patient visible on the unit and interacting with peers.  Patient taking administered medications.

## 2014-12-16 NOTE — Progress Notes (Signed)
Patient ID: Tina [name], female   DOB: May 23, 1970, 44 y.o.   MRN: [id number] Tina [name] Progress Note  12/16/2014 5:05 PM Tina [name]  MRN:  [id number] Subjective:   Patient is reporting some dizziness. Regarding her mood, she does state she is starting to feel better, less severely depressed .   Objective:  Pt case discussed with treatment team and patient seen. Staff reports that patient is partially improved insofar as mood- smiling at times appropriately, going to more groups, less isolated in room.  Hospitalist Service has been following and providing assistance in managing orthostatic hypotension. Patient states she has " always tended to have low blood pressure ", but never this severe . She describes some dizziness which she attributes to midodrine rather than to hypotension. BP has tended to improve , today 111/52, although still orthostatic. She is drinking fluids , appetite is fair .   Today we also had family meeting with patient's mother ( in person ) and father ( via conference call) , CSW, pharmacist, and myself. Family corroborates that patient appears improved compared to admission with less severe depression, more verbal output, although is still far from baseline. Family also confirmed a history of at least one prior  manic episode .  We discussed current medical issues, and importance of following up with PCP, Endocrinologist after discharge. As noted, Cortisol level was in the low range of normal and further outpatient follow up monitoring and testing is warranted . We reviewed current medication regimen and rationale . As noted, patient does feel Effexor XR is helping , does not endorse side effects from this medication at present . We reviewed disposition planning- patient interested in going to IOP after discharge. She may be staying with mother after discharge for further support, and as advised, she plans to abstain from driving until her symptoms of dizziness resolve .  Mother states she will help provide transportation.  There are no psychotic symptoms.  She is denying any suicidal ideations today.     Principal Problem: Bipolar I disorder, current or most recent episode depressed, in partial remission (HCC) Diagnosis:   Patient Active Problem List   Diagnosis Date Noted  . Affective psychosis, bipolar (HCC) [F31.9]   . Orthostatic hypotension [I95.1] 12/12/2014  . Bipolar disorder, current episode depressed, severe, without psychotic features (HCC) [F31.4]   . Bipolar affective disorder, current episode severe (HCC) [F31.9] 12/02/2014  . Bipolar I disorder, current or most recent episode depressed, in partial remission (HCC) [F31.75] 07/11/2014  . Bipolar 1 disorder, mixed (HCC) [F31.60] 06/27/2014  . Disease of thyroid gland [E07.9] 06/27/2014  . Anxiety [F41.9] 06/27/2014  . 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 Bipolar 1 disorder, depressed, partial remission (HCC) [F31.75] 06/27/2014  . 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 Alcohol use disorder, severe, dependence (HCC) [F10.20] 06/24/2014  . Bipolar disorder, unspecified (HCC) [F31.9] 02/16/2013  . Adrenal insufficiency (HCC) [E27.40]   . Acne [L70.9]   . Cellulitis [L03.90]   . Hx of Lyme disease [Z86.19]   . Headache [R51]   . 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 Bipolar I disorder, most recent episode (or current) manic (HCC) [F31.10] 08/29/2012  . Unspecified hypothyroidism [E03.9] 12/23/2010  . Myalgia [M79.1] 12/23/2010  . Fatigue [R53.83] 12/23/2010  . Unspecified vitamin deficiency [E56.9] 12/23/2010  . Depression [F32.9]    Total Time spent with patient: 40 minutes - more than 50 % of time spent  on disposition planning , counseling with patient and family.  Past Medical History:  Past Medical History  Diagnosis Date  . History of chicken pox   . Hypothyroidism   . Genital warts    . Depression   . Bipolar 2 disorder   . Adrenal insufficiency   . Acne   . Cellulitis   . Hx of Lyme disease   . Headache     Past Surgical History  Procedure Laterality Date  . Tonsillectomy    . Cesarean section  2003, 2007, 2011  . Umbilical hernia repair    . Inguinal hernia repair Bilateral    Family History:  Family History  Problem Relation Age of Onset  . Heart disease Other     Grandparent  . Alcohol abuse Paternal Grandmother    Social History:  History  Alcohol Use  . 7.2 - 10.8 oz/week  . 2-8 Cans of beer, 10 Shots of liquor per week    Comment: social drinker-few drinks a week      History  Drug Use No    Social History   Social History  . Marital Status: Married    Spouse Name: N/A  . Number of Children: N/A  . Years of Education: 16   Occupational History  . HOMEMAKER    Social History Main Topics  . Smoking status: Current Every Day Smoker -- 1.50 packs/day for 29 years    Types: Cigarettes  . Smokeless tobacco: None  . Alcohol Use: 7.2 - 10.8 oz/week    2-8 Cans of beer, 10 Shots of liquor per week     Comment: social drinker-few drinks a week   . Drug Use: No  . Sexual Activity: Not Currently   Other Topics Concern  . None   Social History Narrative   Regular exercise-yes   Additional History:    Sleep: good   Appetite: improving - weight has increased and she has gained 9 lbs since admission- current BMI 21.5    Musculoskeletal: Strength & Muscle Tone: within normal limits Gait & Station: walks slowly .  Patient leans: N/A  Psychiatric Specialty Exam: Physical Exam  Review of Systems  Psychiatric/Behavioral: Positive for depression. Negative for suicidal ideas and hallucinations. The patient is nervous/anxious.   All other systems reviewed and are negative.  no chest pain, no shortness of breath, no  Nausea ,  No vomiting,  Reports mild dizziness when she first stands up . No falls .   Blood pressure 111/52, pulse 102,  temperature 98.2 F (36.8 C), temperature source Oral, resp. rate 18, height 5" (0.127 m), weight 109 lb (49.442 kg), SpO2 100 %.Body mass index is 3,065.41 kg/(m^2).  General Appearance: improved grooming   Eye Contact::  Good  Speech:  Normal Rate  Volume:  Decreased  Mood:   Remains depressed but has improved   Affect:   Is becoming more reactive in affect, smiles more often, appropriately  Thought Process:  Goal Directed and Linear  Orientation:  Full (Time, Place, and Person)  Thought Content:  denies hallucinations, no delusions , not internally preoccupied    Suicidal Thoughts:  No denies any  SI, denies any plan or intention of hurting self on unit and contracts for safety   Homicidal Thoughts:  No  Memory:  recent and remote grossly intact   Judgement:  Fair  Insight:  Present  Psychomotor Activity:  Still decreased but a little more active   Concentration:  Good  Recall:  Good  Fund of Knowledge:Good  Language: Good  Akathisia:  Negative  Handed:  Right  AIMS (if indicated):     Assets:  Desire for Improvement Housing Resilience Vocational/Educational  ADL's:   Improved   Cognition: WNL  Sleep:  Number of Hours: 6     Current Medications: Current Facility-Administered Medications  Medication Dose Route Frequency Provider Last Rate Last Dose  . acetaminophen (TYLENOL) tablet 650 mg  650 mg Oral Q6H PRN Kerry Hough, PA-C      . alum & mag hydroxide-simeth (MAALOX/MYLANTA) 200-200-20 MG/5ML suspension 30 mL  30 mL Oral Q4H PRN Kerry Hough, PA-C      . feeding supplement (ENSURE ENLIVE) (ENSURE ENLIVE) liquid 237 mL  237 mL Oral BID BM Mekhi Lascola A Johna Kearl, [name]   237 mL at 12/15/14 1520  . hydrOXYzine (ATARAX/VISTARIL) tablet 25 mg  25 mg Oral QHS PRN Worthy Flank, NP   25 mg at 12/16/14 0055  . lamoTRIgine (LAMICTAL) tablet 200 mg  200 mg Oral QHS Kerry Hough, PA-C   200 mg at 12/15/14 2147  . lithium carbonate (LITHOBID) CR tablet 300 mg  300 mg Oral Q12H  Craige Cotta, [name]   300 mg at 12/16/14 2956  . midodrine (PROAMATINE) tablet 5 mg  5 mg Oral BID WC Marinda Elk, [name]   5 mg at 12/16/14 1648  . nicotine polacrilex (NICORETTE) gum 2 mg  2 mg Oral PRN Craige Cotta, [name]   2 mg at 12/04/14 1320  . thyroid (ARMOUR) tablet 120 mg  120 mg Oral QAC breakfast Kerry Hough, PA-C   120 mg at 12/16/14 0631  . topiramate (TOPAMAX) tablet 25 mg  25 mg Oral BID Craige Cotta, [name]   25 mg at 12/16/14 1648  . venlafaxine XR (EFFEXOR-XR) 24 hr capsule 187.5 mg  187.5 mg Oral Q breakfast Rockey Situ Girtha Kilgore, [name]   187.5 mg at 12/16/14 2130  . zolpidem (AMBIEN) tablet 5 mg  5 mg Oral QHS PRN Sanjuana Kava, NP   5 mg at 12/15/14 2148    Lab Results:  No results found for this or any previous visit (from the past 48 hour(s)).  Physical Findings: AIMS: Facial and Oral Movements Muscles of Facial Expression: None, normal Lips and Perioral Area: None, normal Jaw: None, normal Tongue: None, normal,Extremity Movements Upper (arms, wrists, hands, fingers): None, normal Lower (legs, knees, ankles, toes): None, normal, Trunk Movements Neck, shoulders, hips: None, normal, Overall Severity Severity of abnormal movements (highest score from questions above): None, normal Incapacitation due to abnormal movements: None, normal Patient's awareness of abnormal movements (rate only patient's report): No Awareness, Dental Status Current problems with teeth and/or dentures?: No Does patient usually wear dentures?: No  CIWA:  CIWA-Ar Total: 1 COWS:  COWS Total Score: 2   Assessment -  Depression persists but is now improving partially. Her affect is more reactive . She is tolerating Effexor XR / Lithium trial well thus far . She has history of low BP , but has presented with significant orthostasis on unit, due to which medications have been adjusted ( latuda was D/Cd , Topamax- which can at times contribute to low blood pressure and depression - was tapered down  ) . She is now on Midodrine, and BP readings have trended up , although remains orthostatic .  At this time not suicidal , no psychotic symptoms.    Treatment Plan Summary: Daily contact with patient to  assess and evaluate symptoms and progress in treatment, Medication management, Plan inpatient admission and medications as below  Decrease  Topamax  To 25 mgr BID for migraine prophylaxis and for mood disorder - patient does not want to stop the medication completely as it has helped decrease frequency of headache . Continue to Encourage PO fluids .  Continue Midodrine for management of low blood pressure, orthostasis Continue Lithium  300  mgrs  BID   For mood disorder   Continue Effexor XR  To 187.5  mgrs QDAY for  Ongoing depression ( prefer slow  Dose titration to minimize side effects)  Continue Lamictal 200 mgrs QDAY for mood disorder  Encourage ongoing group, milieu participation to work on stressors, coping skills, enhance mood  Continue Armour Thyroid for Hypothyroidism. Appreciate Hospitalist management / care .   Treatment team working on disposition planning- patient to follow up with Endocrinologist for management of Hypothyriodism and suspected Adrenal insufficiency    Reganne Messerschmidt, [name] 12/16/2014, 5:05 PM

## 2014-12-17 MED ORDER — VENLAFAXINE HCL ER 75 MG PO CP24
225.0000 mg | ORAL_CAPSULE | Freq: Every day | ORAL | Status: DC
  Administered 2014-12-18 – 2014-12-22 (×5): 225 mg via ORAL
  Filled 2014-12-17 (×8): qty 3

## 2014-12-17 NOTE — [facility] Group Notes (Signed)
[facility] LCSW Aftercare Discharge Planning Group Note  12/17/2014 8:45 AM  Pt did not attend, declined invitation.   Chad Cordial, LCSWA 12/17/2014 9:30 AM

## 2014-12-17 NOTE — Progress Notes (Addendum)
D) Pt. Affect and mood appear sullen, but pt. States she is feeling somewhat better regarding her mood.  Pt. Continues to c/o dizziness and nausea related to orthostatic hypotension. Pt. Reports eating a small breakfast.  Noted drinking increased  fluids. Pt. Remained in bed much of the am, but was up for meals and groups midday.  A) Pt. Placed on high fall risk precautions due to dizziness.  Pt. Given yellow wrist band, and yellow socks and reminded to get up slowly and change positions slowly.  Red sticker placed on pt. Name card.  Pt. Encouraged to drink and eat.  Encouraged to drink ensure as ordered.  R) Pt. Receptive and contracts for safety.  Pt. Noted using guardrails for support.

## 2014-12-17 NOTE — [facility] Group Notes (Signed)
[facility] LCSW Group Therapy 12/17/2014 1:15 PM  Type of Therapy: Group Therapy- Emotion Regulation  Participation Level: Minimal  Participation Quality:  Appropriate  Affect: Flat  Cognitive: Alert and Oriented   Insight:  Developing/Improving  Engagement in Therapy: Developing/Improving and Engaged   Modes of Intervention: Clarification, Confrontation, Discussion, Education, Exploration, Limit-setting, Orientation, Problem-solving, Rapport Building, Dance movement psychotherapist, Socialization and Support  Summary of Progress/Problems: The topic for group today was emotional regulation. This group focused on both positive and negative emotion identification and allowed group members to process ways to identify feelings, regulate negative emotions, and find healthy ways to manage internal/external emotions. Group members were asked to reflect on a time when their reaction to an emotion led to a negative outcome and explored how alternative responses using emotion regulation would have benefited them. Group members were also asked to discuss a time when emotion regulation was utilized when a negative emotion was experienced. Pt affect and demeanor has improved. Pt expressed that she has difficulty regulating guilt and grief. Pt was supportive to peer who began crying and offered positive feedback.   Chad Cordial, LCSWA 12/17/2014 4:07 PM

## 2014-12-17 NOTE — Evaluation (Signed)
Physical Therapy Evaluation Patient Details Name: Tina [name] MRN: [id number] DOB: Sep 11, 1970 Today's Date: 12/17/2014   History of Present Illness  44 yo female admitted with MDD, orthostatic hypotension.   Clinical Impression  On eval pt was supervision level for mobility-walked ~60 feet in room. Pt does report mild-mod dizziness. Spoke with pt and RN-this is likely attributed to + orthostatic hypotension. Pt reports she does not have any mobility or physical deficits/issues. Limiting factor is hypotension. Instructed pt to slowly change positions and to allow body increased time to adjust. Cues to sit a short while before standing and to stand a short while before walking. Pt reports she has been cautious and she tends to stay close to hallway handrails/walls for safety. Recommend supervision for OOB/mobility. Do not feel pt has any PT needs and pt is in agreement.    Follow Up Recommendations No PT follow up;Supervision for mobility/OOB    Equipment Recommendations  None recommended by PT    Recommendations for Other Services       Precautions / Restrictions Precautions Precautions: Fall Precaution Comments: orthostatic hypotension Restrictions Weight Bearing Restrictions: No      Mobility  Bed Mobility Overal bed mobility: Independent                Transfers Overall transfer level: Independent                  Ambulation/Gait Ambulation/Gait assistance: Supervision Ambulation Distance (Feet): 60 Feet Assistive device: None       General Gait Details: close supervision due to pt report of mild-mod dizziness. No LOB. No external assist given. slow, guarded gait pattern.  Stairs            Wheelchair Mobility    Modified Rankin (Stroke Patients Only)       Balance Overall balance assessment: Needs assistance         Standing balance support: During functional activity Standing balance-Leahy Scale: Good                                Pertinent Vitals/Pain Pain Assessment: No/denies pain    Home Living Family/patient expects to be discharged to:: Private residence Living Arrangements: Alone;Children     Home Access: Stairs to enter   Entrance Stairs-Number of Steps: a few   Home Equipment: None      Prior Function Level of Independence: Independent               Hand Dominance        Extremity/Trunk Assessment   Upper Extremity Assessment: Overall WFL for tasks assessed           Lower Extremity Assessment: Overall WFL for tasks assessed      Cervical / Trunk Assessment: Normal  Communication   Communication: No difficulties  Cognition Arousal/Alertness: Awake/alert Behavior During Therapy: WFL for tasks assessed/performed Overall Cognitive Status: Within Functional Limits for tasks assessed                      General Comments      Exercises        Assessment/Plan    PT Assessment Patent does not need any further PT services  PT Diagnosis     PT Problem List    PT Treatment Interventions     PT Goals (Current goals can be found in the Care Plan section) Acute Rehab PT Goals Patient  Stated Goal: to not be dizzy PT Goal Formulation: All assessment and education complete, DC therapy    Frequency     Barriers to discharge        Co-evaluation               End of Session Equipment Utilized During Treatment: Gait belt Activity Tolerance: Patient tolerated treatment well Patient left: in bed;with call bell/phone within reach      Functional Assessment Tool Used: clinical judgement Functional Limitation: Mobility: Walking and moving around Mobility: Walking and Moving Around Current Status (Z6109): At least 1 percent but less than 20 percent impaired, limited or restricted Mobility: Walking and Moving Around Goal Status [phone]): At least 1 percent but less than 20 percent impaired, limited or restricted Mobility: Walking and Moving  Around Discharge Status [phone]): At least 1 percent but less than 20 percent impaired, limited or restricted    Time: 1447-1455 PT Time Calculation (min) (ACUTE ONLY): 8 min   Charges:   PT Evaluation $Initial PT Evaluation Tier I: 1 Procedure     PT G Codes:   PT G-Codes **NOT FOR INPATIENT CLASS** Functional Assessment Tool Used: clinical judgement Functional Limitation: Mobility: Walking and moving around Mobility: Walking and Moving Around Current Status (B1478): At least 1 percent but less than 20 percent impaired, limited or restricted Mobility: Walking and Moving Around Goal Status [phone]): At least 1 percent but less than 20 percent impaired, limited or restricted Mobility: Walking and Moving Around Discharge Status [phone]): At least 1 percent but less than 20 percent impaired, limited or restricted    Rebeca Alert, MPT Pager: [phone]

## 2014-12-17 NOTE — Progress Notes (Addendum)
Patient ID: Tina [name], female   DOB: December 11, 1970, 44 y.o.   MRN: [id number] [facility] MD Progress Note  12/17/2014 12:21 PM Tina [name]  MRN:  [id number] Subjective:   She states she is " starting to feel better now". She does complain of dizziness . She denies other side effects.  Objective:  Pt case discussed with treatment team and patient seen. Mood less severely depressed, today smiles more often and has improved verbal output . She denies suicidal ideations at this time. States that she felt better after her family meeting with her family yesterday.  She feels that Effexor XR is helping , and denies side effects.  BP has improved , but remains orthostatic .  Appetite somewhat improved, sleep "OK" No disruptive behaviors on unit.     Principal Problem: Bipolar I disorder, current or most recent episode depressed, in partial remission (HCC) Diagnosis:   Patient Active Problem List   Diagnosis Date Noted  . Affective psychosis, bipolar (HCC) [F31.9]   . Orthostatic hypotension [I95.1] 12/12/2014  . Bipolar disorder, current episode depressed, severe, without psychotic features (HCC) [F31.4]   . Bipolar affective disorder, current episode severe (HCC) [F31.9] 12/02/2014  . Bipolar I disorder, current or most recent episode depressed, in partial remission (HCC) [F31.75] 07/11/2014  . Bipolar 1 disorder, mixed (HCC) [F31.60] 06/27/2014  . Disease of thyroid gland [E07.9] 06/27/2014  . Anxiety [F41.9] 06/27/2014  . 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 Bipolar 1 disorder, depressed, partial remission (HCC) [F31.75] 06/27/2014  . 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 Alcohol use disorder, severe, dependence (HCC) [F10.20] 06/24/2014  . Bipolar disorder, unspecified (HCC) [F31.9] 02/16/2013  . Adrenal insufficiency (HCC) [E27.40]   . Acne [L70.9]   . Cellulitis [L03.90]   . Hx of Lyme  disease [Z86.19]   . Headache [R51]   . 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 Bipolar I disorder, most recent episode (or current) manic (HCC) [F31.10] 08/29/2012  . Unspecified hypothyroidism [E03.9] 12/23/2010  . Myalgia [M79.1] 12/23/2010  . Fatigue [R53.83] 12/23/2010  . Unspecified vitamin deficiency [E56.9] 12/23/2010  . Depression [F32.9]    Total Time spent with patient:  20 minutes   Past Medical History:  Past Medical History  Diagnosis Date  . History of chicken pox   . Hypothyroidism   . Genital warts   . Depression   . Bipolar 2 disorder   . Adrenal insufficiency   . Acne   . Cellulitis   . Hx of Lyme disease   . Headache     Past Surgical History  Procedure Laterality Date  . Tonsillectomy    . Cesarean section  2003, 2007, 2011  . Umbilical hernia repair    . Inguinal hernia repair Bilateral    Family History:  Family History  Problem Relation Age of Onset  . Heart disease Other     Grandparent  . Alcohol abuse Paternal Grandmother    Social History:  History  Alcohol Use  . 7.2 - 10.8 oz/week  . 2-8 Cans of beer, 10 Shots of liquor per week    Comment: social drinker-few drinks a week      History  Drug Use No    Social History   Social History  . Marital Status: Married    Spouse Name: N/A  . Number of Children: N/A  . Years of Education: 16   Occupational History  . HOMEMAKER  Social History Main Topics  . Smoking status: Current Every Day Smoker -- 1.50 packs/day for 29 years    Types: Cigarettes  . Smokeless tobacco: None  . Alcohol Use: 7.2 - 10.8 oz/week    2-8 Cans of beer, 10 Shots of liquor per week     Comment: social drinker-few drinks a week   . Drug Use: No  . Sexual Activity: Not Currently   Other Topics Concern  . None   Social History Narrative   Regular exercise-yes   Additional History:    Sleep: good   Appetite: improving    Musculoskeletal: Strength & Muscle Tone: within normal limits Gait &  Station: walks slowly .  Patient leans: N/A  Psychiatric Specialty Exam: Physical Exam  Review of Systems  Psychiatric/Behavioral: Positive for depression. Negative for suicidal ideas and hallucinations. The patient is nervous/anxious.   All other systems reviewed and are negative.  no chest pain, no shortness of breath, no  Nausea ,  No vomiting,  (+) dizziness  . No falls .   Blood pressure 106/65, pulse 99, temperature 98.6 F (37 C), temperature source Oral, resp. rate 18, height 5" (0.127 m), weight 109 lb (49.442 kg), SpO2 97 %.Body mass index is 3,065.41 kg/(m^2).  General Appearance: improved grooming   Eye Contact::  Good  Speech:  Normal Rate  Volume:  improved today  Mood:   Remains depressed but has improved   Affect:   Is becoming more reactive in affect, smiles more often, appropriately  Thought Process:  Goal Directed and Linear  Orientation:  Full (Time, Place, and Person)  Thought Content:  denies hallucinations, no delusions , not internally preoccupied    Suicidal Thoughts:  No denies any  SI, denies any plan or intention of hurting self on unit and contracts for safety   Homicidal Thoughts:  No  Memory:  recent and remote grossly intact   Judgement:  Fair  Insight:  Present  Psychomotor Activity:  Still decreased but a little more active   Concentration:  Good  Recall:  Good  Fund of Knowledge:Good  Language: Good  Akathisia:  Negative  Handed:  Right  AIMS (if indicated):     Assets:  Desire for Improvement Housing Resilience Vocational/Educational  ADL's:   Improved   Cognition: WNL  Sleep:  Number of Hours: 6.25     Current Medications: Current Facility-Administered Medications  Medication Dose Route Frequency Provider Last Rate Last Dose  . acetaminophen (TYLENOL) tablet 650 mg  650 mg Oral Q6H PRN Kerry Hough, PA-C      . alum & mag hydroxide-simeth (MAALOX/MYLANTA) 200-200-20 MG/5ML suspension 30 mL  30 mL Oral Q4H PRN Kerry Hough, PA-C       . feeding supplement (ENSURE ENLIVE) (ENSURE ENLIVE) liquid 237 mL  237 mL Oral BID BM Rockey Situ Cobos, MD   237 mL at 12/17/14 1020  . hydrOXYzine (ATARAX/VISTARIL) tablet 25 mg  25 mg Oral QHS PRN Worthy Flank, NP   25 mg at 12/16/14 2312  . lamoTRIgine (LAMICTAL) tablet 200 mg  200 mg Oral QHS Kerry Hough, PA-C   200 mg at 12/16/14 2312  . lithium carbonate (LITHOBID) CR tablet 300 mg  300 mg Oral Q12H Craige Cotta, MD   300 mg at 12/17/14 0912  . midodrine (PROAMATINE) tablet 5 mg  5 mg Oral BID WC Marinda Elk, MD   5 mg at 12/17/14 0913  . nicotine polacrilex (NICORETTE) gum  2 mg  2 mg Oral PRN Craige Cotta, MD   2 mg at 12/04/14 1320  . thyroid (ARMOUR) tablet 120 mg  120 mg Oral QAC breakfast Kerry Hough, PA-C   120 mg at 12/17/14 1610  . topiramate (TOPAMAX) tablet 25 mg  25 mg Oral BID Craige Cotta, MD   25 mg at 12/17/14 0914  . venlafaxine XR (EFFEXOR-XR) 24 hr capsule 187.5 mg  187.5 mg Oral Q breakfast Craige Cotta, MD   187.5 mg at 12/17/14 0915    Lab Results:  No results found for this or any previous visit (from the past 48 hour(s)).  Physical Findings: AIMS: Facial and Oral Movements Muscles of Facial Expression: None, normal Lips and Perioral Area: None, normal Jaw: None, normal Tongue: None, normal,Extremity Movements Upper (arms, wrists, hands, fingers): None, normal Lower (legs, knees, ankles, toes): None, normal, Trunk Movements Neck, shoulders, hips: None, normal, Overall Severity Severity of abnormal movements (highest score from questions above): None, normal Incapacitation due to abnormal movements: None, normal Patient's awareness of abnormal movements (rate only patient's report): No Awareness, Dental Status Current problems with teeth and/or dentures?: No Does patient usually wear dentures?: No  CIWA:  CIWA-Ar Total: 1 COWS:  COWS Total Score: 2   Assessment -   Partial improvement of mood both reported by patient  and noted by staff- most noticeably, seems less psychomotorically retarded and less blunted in affect. BP improved, but still orthostatic . She is tolerating medications well at this time and feels Effexor XR is helping . She complains of dizziness . As she improved we have started to focus more on disposition plans- patient plans to return home, and mother has stated she will help patient with daily support and transportation as needed, as she has been advised not to drive as long as she continues to have dizziness . At this time she states she plans to take FMLA , time off work to focus on improving heath. Patient is expressing interest in continuing to titrate Effexor XR .    Treatment Plan Summary: Daily contact with patient to assess and evaluate symptoms and progress in treatment, Medication management, Plan inpatient admission and medications as below  Continue   Topamax   25 mgr BID for migraine prophylaxis and for mood disorder.  Continue to Encourage PO fluids .  Continue Midodrine for management of low blood pressure, orthostasis Continue Lithium  300  mgrs  BID   For mood disorder   Increase Effexor XR  To 225   mgrs QDAY for  Ongoing depression  Continue Lamictal 200 mgrs QDAY for mood disorder  Encourage ongoing group, milieu participation to work on stressors, coping skills, enhance mood  Continue Armour Thyroid for Hypothyroidism. Appreciate Hospitalist management / care .  Obtain Lithium serum level to  Monitor lithium management  Treatment team working on disposition planning- patient to follow up with Endocrinologist for management of Hypothyriodism and suspected Adrenal insufficiency  As discussed with treatment team, will request PT consult to help evaluate gait and strategies to decrease fall risk.   Nehemiah Massed, MD 12/17/2014, 12:21 PM

## 2014-12-17 NOTE — Progress Notes (Signed)
D: Patient alert and oriented x 4. Patient denies pain/SI/HI/AVH. Patient complained of dizziness, but was feeling a little better. Patient has been in dayroom interacting with other patients.  A: Staff to monitor Q 15 mins for safety. Encouragement and support offered. Scheduled medications administered per orders. R: Patient remains safe on the unit. Patient attended group tonight. Patient visible on hte unit and interacting with peers. Patient taking administered medications.

## 2014-12-17 NOTE — Progress Notes (Signed)
Pt stated that she had an ok day. Her goal for today was to stay out of bed as much as possible and she was able to do it. Her goal for tomorrow is to work on discharge planning.

## 2014-12-17 NOTE — Progress Notes (Signed)
Family meeting was held with Pt, Pt's mother Genelle Gather, Pt's father and stepmother (via telephone), MD, and pharmacist. MD and pharmacist fielded questions from Pt and family about causes for Pt's low blood pressure, medication trials, and plan of care moving forward. MD expressed that he felt Pt was making some improvement physically and emotionally. Pt support after hospitalization was discussed and Pt's mother agreeable to staying with Pt or having Pt stay with her. If Pt has FMLA benefits, MD suggested that Pt take time off of work and attend IOP at [facility] outpatient; Pt agreeable to this plan. Pt still not agreeable to ECT.   Chad Cordial, LCSWA Clinical Social Work [phone]

## 2014-12-17 NOTE — Progress Notes (Signed)
Recreation Therapy Notes   Date: 10.05.2016 Time: 9:30am Location: [address] Group Room   Group Topic: Stress Management  Goal Area(s) Addresses:  Patient will actively participate in stress management techniques presented during session.   Behavioral Response: Did not attend.   Raiford Fetterman L Aleira Deiter, LRT/CTRS        Jakeria Caissie L 12/17/2014 1:14 PM 

## 2014-12-18 LAB — LITHIUM LEVEL: Lithium Lvl: 0.64 mmol/L (ref 0.60–1.20)

## 2014-12-18 MED ORDER — LITHIUM CARBONATE 150 MG PO CAPS
150.0000 mg | ORAL_CAPSULE | Freq: Two times a day (BID) | ORAL | Status: DC
  Filled 2014-12-18 (×6): qty 1

## 2014-12-18 NOTE — [facility] Group Notes (Signed)
[facility] Group Therapy 12/18/2014 1:15pm  Type of Therapy: [facility] Presentation  Pt did not attend, declined invitation.   Chad Cordial, LCSWA 12/18/2014 1:45 PM

## 2014-12-18 NOTE — Tx Team (Signed)
Interdisciplinary Treatment Plan Update (Adult) Date: 12/09/2014   Date: 12/09/2014 1:46 PM  Progress in Treatment:  Attending groups: Intermittently Participating in groups: Minimally Taking medication as prescribed: Yes  Tolerating medication: Yes  Family/Significant othe contact made:Yes with mother Patient understands diagnosis: Yes Discussing patient identified problems/goals with staff: Yes  Medical problems stabilized or resolved: Yes  Denies suicidal/homicidal ideation: Yes Patient has not harmed self or Others: Yes   New problem(s) identified: None identified at this time.   Discharge Plan or Barriers: Pt will return home and follow-up with Letta Moynahan and [facility]  12/18/14: Referral made to [facility]  Additional comments: n/a   Reason for Continuation of Hospitalization:  Depression Anxiety  Medication stabilization Suicidal ideation  Estimated length of stay: 2-3 days  Review of initial/current patient goals per problem list:   1.  Goal(s): Patient will participate in aftercare plan  Met:  Yes  Target date: 3-5 days from date of admission   As evidenced by: Patient will participate within aftercare plan AEB aftercare provider and housing plan at discharge being identified.   12/02/14: pt will return home and follow-up with Letta Moynahan.  12/16/14: Referral made to [facility]  2.  Goal (s): Patient will exhibit decreased depressive symptoms and suicidal ideations.  Met:  Goal progressing  Target date: 3-5 days from date of admission   As evidenced by: Patient will utilize self rating of depression at 3 or below and demonstrate decreased signs of depression or be deemed stable for discharge by MD. 12/02/14: Pt was admitted with symptoms of depression, rating 10/10. Pt continues to present with flat affect and depressive symptoms.  Pt will demonstrate decreased symptoms of depression and rate depression at 3/10 or lower prior to  discharge. 12/04/14: Pt rates depression at 7/10; denies SI. 12/09/14: Pt rates depression at 6/10; denies SI. 12/11/14: Pt rates depression at 6/10; denies SI. 12/16/14: Pt rates depression at 4/10; denies SI 12/18/14: Pt rates depression at 4/10; denies SI. Observed to have brighter affect and processing speed is improving.  3.  Goal(s): Patient will demonstrate decreased signs and symptoms of anxiety.  Met:  Yes  Target date: 3-5 days from date of admission   As evidenced by: Patient will utilize self rating of anxiety at 3 or below and demonstrated decreased signs of anxiety, or be deemed stable for discharge by MD 12/02/14: Pt was admitted with increased levels of anxiety and is currently rating those symptoms highly. Pt will demonstrated decreased symptoms of anxiety and rate it at 3/10 prior to d/c. 12/04/14: Pt rates anxiety at 4/10. 12/09/14: Pt rates anxiety at 0/10.  12/16/14: Pt rates anxiety at 0/10.  Attendees:  Patient:    Family:    Physician: Dr. Parke Poisson, MD  12/18/2014 1:46 PM  Nursing: Lars Pinks, RN Case manager  12/18/2014 1:46 PM  Clinical Social Worker Norman Clay, MSW 12/18/2014 1:46 PM  Other: Lucinda Dell, Beverly Sessions Liasion 12/18/2014 1:46 PM  Clinical: Grayland Ormond, RN 12/18/2014 1:46 PM  Other: , RN Charge Nurse 12/18/2014 1:46 PM  Other:     Peri Maris, [place] MSW

## 2014-12-18 NOTE — Progress Notes (Signed)
Patient ID: Tina [name], female   DOB: 01/11/1971, 44 y.o.   MRN: [id number] [facility] MD Progress Note  12/18/2014 11:01 AM Tina [name]  MRN:  [id number] Subjective:   Patient continues to feel better, although still far from her premorbid baseline. She states dizziness is still present, but at this time she does not feel it is as much related to hypotension or orthostasis, and more related to medication side effect. She also reports some subjective sense of blurry vision. Which she attributes to lithium. Regarding Effexor XR , she does feel this medication is helping significantly, and denies side effects.   Objective:  Pt case discussed with treatment team and patient seen At this time patient is improved compared to admission and improvement has been noted by writer and staff over the last few days - she seems less depressed , with improved verbal output, significant improvement of the psychomotor retardation she was presenting with, and is now out of bed more often, going to groups, and becoming more interactive with peers again.  Her vitals have improved significantly, and orthostasis has subsided -  She denies suicidal ideations at this time. As she improves we are focusing more on disposition planning - patient's main concern relates to her job, as at this time does not feel it is realistic for her to return to work until she is further improved , stabilized. CSW is helping her obtain FMLA paperwork . She is thinking of living with her mother for a period of time in order to get more support, and mother has agreed to provide transportation to outpatient appointments . As noted, patient attributes blurry vision mostly to lithium , and wants to taper off this medication.  Lithium level 0.6     Principal Problem: Bipolar I disorder, current or most recent episode depressed, in partial remission (HCC) Diagnosis:   Patient Active Problem List   Diagnosis Date Noted  . Affective psychosis,  bipolar (HCC) [F31.9]   . Orthostatic hypotension [I95.1] 12/12/2014  . Bipolar disorder, current episode depressed, severe, without psychotic features (HCC) [F31.4]   . Bipolar affective disorder, current episode severe (HCC) [F31.9] 12/02/2014  . Bipolar I disorder, current or most recent episode depressed, in partial remission (HCC) [F31.75] 07/11/2014  . Bipolar 1 disorder, mixed (HCC) [F31.60] 06/27/2014  . Disease of thyroid gland [E07.9] 06/27/2014  . Anxiety [F41.9] 06/27/2014  . 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 Bipolar 1 disorder, depressed, partial remission (HCC) [F31.75] 06/27/2014  . 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 Alcohol use disorder, severe, dependence (HCC) [F10.20] 06/24/2014  . Bipolar disorder, unspecified (HCC) [F31.9] 02/16/2013  . Adrenal insufficiency (HCC) [E27.40]   . Acne [L70.9]   . Cellulitis [L03.90]   . Hx of Lyme disease [Z86.19]   . Headache [R51]   . 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 Bipolar I disorder, most recent episode (or current) manic (HCC) [F31.10] 08/29/2012  . Unspecified hypothyroidism [E03.9] 12/23/2010  . Myalgia [M79.1] 12/23/2010  . Fatigue [R53.83] 12/23/2010  . Unspecified vitamin deficiency [E56.9] 12/23/2010  . Depression [F32.9]    Total Time spent with patient:  20 minutes   Past Medical History:  Past Medical History  Diagnosis Date  . History of chicken pox   . Hypothyroidism   . Genital warts   . Depression   . Bipolar 2 disorder   . Adrenal insufficiency   . Acne   . Cellulitis   .  Hx of Lyme disease   . Headache     Past Surgical History  Procedure Laterality Date  . Tonsillectomy    . Cesarean section  2003, 2007, 2011  . Umbilical hernia repair    . Inguinal hernia repair Bilateral    Family History:  Family History  Problem Relation Age of Onset  . Heart disease Other     Grandparent  .  Alcohol abuse Paternal Grandmother    Social History:  History  Alcohol Use  . 7.2 - 10.8 oz/week  . 2-8 Cans of beer, 10 Shots of liquor per week    Comment: social drinker-few drinks a week      History  Drug Use No    Social History   Social History  . Marital Status: Married    Spouse Name: N/A  . Number of Children: N/A  . Years of Education: 16   Occupational History  . HOMEMAKER    Social History Main Topics  . Smoking status: Current Every Day Smoker -- 1.50 packs/day for 29 years    Types: Cigarettes  . Smokeless tobacco: None  . Alcohol Use: 7.2 - 10.8 oz/week    2-8 Cans of beer, 10 Shots of liquor per week     Comment: social drinker-few drinks a week   . Drug Use: No  . Sexual Activity: Not Currently   Other Topics Concern  . None   Social History Narrative   Regular exercise-yes   Additional History:    Sleep: good   Appetite: improving    Musculoskeletal: Strength & Muscle Tone: within normal limits Gait & Station: walks slowly .  Patient leans: N/A  Psychiatric Specialty Exam: Physical Exam  Review of Systems  Psychiatric/Behavioral: Positive for depression. Negative for suicidal ideas and hallucinations. The patient is nervous/anxious.   All other systems reviewed and are negative.  no chest pain, no shortness of breath, no  Nausea ,  No vomiting,  (+) dizziness  . No falls . Complains of blurry vision  Blood pressure 93/61, pulse 92, temperature 98.4 F (36.9 C), temperature source Oral, resp. rate 16, height 5" (0.127 m), weight 109 lb (49.442 kg), SpO2 97 %.Body mass index is 3,065.41 kg/(m^2).  General Appearance: improved grooming   Eye Contact::  Good  Speech:  Normal Rate  Volume:  improved today  Mood:   Improving, still depressed but significantly improved compared to prior  Affect:   Is becoming more reactive in affect, less blunted   Thought Process:  Goal Directed and Linear  Orientation:  Full (Time, Place, and Person)   Thought Content:  denies hallucinations, no delusions , not internally preoccupied    Suicidal Thoughts:  No denies any  SI, denies any plan or intention of hurting self on unit and contracts for safety   Homicidal Thoughts:  No  Memory:  recent and remote grossly intact   Judgement:  Fair  Insight:  Present  Psychomotor Activity:  Still decreased but a little more active   Concentration:  Good  Recall:  Good  Fund of Knowledge:Good  Language: Good  Akathisia:  Negative  Handed:  Right  AIMS (if indicated):     Assets:  Desire for Improvement Housing Resilience Vocational/Educational  ADL's:   Improved   Cognition: WNL  Sleep:  Number of Hours: 6.25     Current Medications: Current Facility-Administered Medications  Medication Dose Route Frequency Provider Last Rate Last Dose  . acetaminophen (TYLENOL) tablet 650 mg  650 mg Oral Q6H PRN Kerry Hough, PA-C      . alum & mag hydroxide-simeth (MAALOX/MYLANTA) 200-200-20 MG/5ML suspension 30 mL  30 mL Oral Q4H PRN Kerry Hough, PA-C      . feeding supplement (ENSURE ENLIVE) (ENSURE ENLIVE) liquid 237 mL  237 mL Oral BID BM Rockey Situ Cobos, MD   237 mL at 12/17/14 1447  . hydrOXYzine (ATARAX/VISTARIL) tablet 25 mg  25 mg Oral QHS PRN Worthy Flank, NP   25 mg at 12/17/14 2300  . lamoTRIgine (LAMICTAL) tablet 200 mg  200 mg Oral QHS Kerry Hough, PA-C   200 mg at 12/17/14 2300  . lithium carbonate (LITHOBID) CR tablet 300 mg  300 mg Oral Q12H Craige Cotta, MD   300 mg at 12/17/14 1948  . midodrine (PROAMATINE) tablet 5 mg  5 mg Oral BID WC Marinda Elk, MD   5 mg at 12/18/14 1610  . nicotine polacrilex (NICORETTE) gum 2 mg  2 mg Oral PRN Craige Cotta, MD   2 mg at 12/04/14 1320  . thyroid (ARMOUR) tablet 120 mg  120 mg Oral QAC breakfast Kerry Hough, PA-C   120 mg at 12/18/14 9604  . topiramate (TOPAMAX) tablet 25 mg  25 mg Oral BID Craige Cotta, MD   25 mg at 12/18/14 5409  . venlafaxine XR  (EFFEXOR-XR) 24 hr capsule 225 mg  225 mg Oral Q breakfast Craige Cotta, MD   225 mg at 12/18/14 8119    Lab Results:  Results for orders placed or performed during the hospital encounter of 12/02/14 (from the past 48 hour(s))  Lithium level     Status: None   Collection Time: 12/18/14  6:25 AM  Result Value Ref Range   Lithium Lvl 0.64 0.60 - 1.20 mmol/L    Comment: Performed at [facility]    Physical Findings: AIMS: Facial and Oral Movements Muscles of Facial Expression: None, normal Lips and Perioral Area: None, normal Jaw: None, normal Tongue: None, normal,Extremity Movements Upper (arms, wrists, hands, fingers): None, normal Lower (legs, knees, ankles, toes): None, normal, Trunk Movements Neck, shoulders, hips: None, normal, Overall Severity Severity of abnormal movements (highest score from questions above): None, normal Incapacitation due to abnormal movements: None, normal Patient's awareness of abnormal movements (rate only patient's report): No Awareness, Dental Status Current problems with teeth and/or dentures?: No Does patient usually wear dentures?: No  CIWA:  CIWA-Ar Total: 1 COWS:  COWS Total Score: 2   Assessment -   Ongoing gradual but noticeable improvement of mood and affect . Less orthostasis, and BP readings have improved . As she improves she has become more visible on unit and more interactive with others . She complains of some residual dizziness and of blurry vision. She feels the latter is related to lithium . We discussed options and agreed to taper down lithium dose, and consider more gradual titration, to allow more time for side effects to subside.  As she improves treatment team is focusing more on discharge planning- due to patient's dizziness and blurry vision, and psychomotor retardation  ( which are improving gradually)  she has been advised to stay with family member for added support and to abstain from driving . She is  interested in attending IOP.     Treatment Plan Summary: Daily contact with patient to assess and evaluate symptoms and progress in treatment, Medication management, Plan inpatient admission and medications as below  Continue   Topamax   25 mgr BID for migraine prophylaxis and for mood disorder.  Continue to Encourage PO fluids .  Continue Midodrine for management of low blood pressure, orthostasis Decrease  Lithium  To 150   mgrs  BID   For mood disorder  - rationale for taper is to minimize side effects  Continue Effexor XR   225   mgrs QDAY for  Ongoing depression  Continue Lamictal 200 mgrs QDAY for mood disorder  Encourage ongoing group, milieu participation to work on stressors, coping skills, enhance mood  Continue Armour Thyroid for Hypothyroidism. Appreciate Hospitalist management / care .  Treatment team working on disposition planning-  Patient interested in living with mother, taking FMLA from work, and attend IOP Also , patient to follow up with Endocrinologist for management of Hypothyriodism and suspected Adrenal insufficiency     COBOS, FERNANDO, MD 12/18/2014, 11:01 AM

## 2014-12-18 NOTE — [facility] Group Notes (Signed)

## 2014-12-18 NOTE — Progress Notes (Signed)
D:  Patient's self inventory sheet, patient sleeps good, sleep medication is helpful.  Fair appetite, low energy level, poor concentration.  Rated depression and hopeless 3, anxiety 2.  Denied withdrawals.  Stated Tina [name] has felt lightheaded and dizzy.  Denied physical pain.  Goal is to feel better physically and mentally.  Plans to attend groups.  Not sure of discharge plans.  Can afford her medications after discharge. A:  Patient refused lithium today, will discuss with MD tomorrow.  Emotional support and encouragement given patient. R:  Denied SI and HI, contracts for safety.  Denied A/V hallucinations.  Safety maintained with 15 minute checks.

## 2014-12-18 NOTE — Progress Notes (Signed)
D: Patient in the hallway on approach.  Patient states she had a better day today.  Patient states she still has some dizziness but thinks it is getting better.  Patient states she has been eating better today.  Patient states she has been taking her medications.  Patient states her goal for today was to feel better.  Patient states does feel progression.  Patient rates depression 3/10 today which is better than previous days.    Patient denies SI/HI and denies AVH.  A: Staff to monitor Q 15 mins for safety.  Encouragement and support offered.  Scheduled medications administered per orders.  Vistaril administered prn for anxiety. R: Patient remains safe on the unit.  Patient attended group tonight.  Patient visible on the unit and interacting with peers.  Patient taking administered medications.

## 2014-12-19 NOTE — [facility] Group Notes (Signed)
[facility] LCSW Aftercare Discharge Planning Group Note  12/19/2014 8:45 AM  Participation Quality: Alert, Appropriate and Oriented  Mood/Affect: Flat  Depression Rating: 3  Anxiety Rating: 2  Thoughts of Suicide: Pt denies SI/HI  Will you contract for safety? Yes  Current AVH: Pt denies  Plan for Discharge/Comments: Pt attended discharge planning group and actively participated in group. CSW discussed suicide prevention education with the group and encouraged them to discuss discharge planning and any relevant barriers. Pt reports improving mood. No physical complaints.  Transportation Means: Pt reports access to transportation  Supports: Mother  Chad Cordial, Theresia Majors 12/19/2014 9:26 AM

## 2014-12-19 NOTE — [facility] Group Notes (Signed)
[facility] LCSW Group Therapy 12/19/2014 1:15pm  Type of Therapy: Group Therapy- Feelings Around Relapse and Recovery  Participation Level: Reserved  Participation Quality:  Appropriate  Affect:  Appropriate  Cognitive: Alert and Oriented   Insight:  Developing   Engagement in Therapy: Developing/Improving and Engaged   Modes of Intervention: Clarification, Confrontation, Discussion, Education, Exploration, Limit-setting, Orientation, Problem-solving, Rapport Building, Dance movement psychotherapist, Socialization and Support  Summary of Progress/Problems: The topic for today was feelings about relapse. The group discussed what relapse prevention is to them and identified triggers that they are on the path to relapse. Members also processed their feeling towards relapse and were able to relate to common experiences. Group also discussed coping skills that can be used for relapse prevention.  Pt is reserved in group but participates in prompted. She expressed that she tends to isolate when she gets depressed and does not do productive things when isolated. She described her most recent relapse as happening when she got overwhelmed with being a single mom and regretting her divorce.   Therapeutic Modalities:   Cognitive Behavioral Therapy Solution-Focused Therapy Assertiveness Training Relapse Prevention Therapy    Lamar Sprinkles [phone] 12/19/2014 4:49 PM

## 2014-12-19 NOTE — Progress Notes (Signed)
Recreation Therapy Notes  Date: 10.07.2016 Time: 9:30am Location: [address] Group room   Group Topic: Stress Management  Goal Area(s) Addresses:  Patient will actively participate in stress management techniques presented during session.   Behavioral Response: Did not attend.   Marykay Lex Pernie Grosso, LRT/CTRS        Samaa Ueda L 12/19/2014 10:07 AM

## 2014-12-19 NOTE — Progress Notes (Signed)
D) Pt has been attending the groups and interacting with her peers. Affect is flat and mood remains depressed.  A) given support, reassurance and praise. encouragement given. Provided with a 1:1 R) Denies SI and HI. States she is feeling a little better.

## 2014-12-19 NOTE — Plan of Care (Signed)
Problem: Alteration in mood Goal: LTG-Pt's behavior demonstrates decreased signs of depression (Patient's behavior demonstrates decreased signs of depression to the point the patient is safe to return home and continue treatment in an outpatient setting)  Outcome: Progressing Patient rate depression 3/10 today which is less than it has been in precious days.

## 2014-12-19 NOTE — Progress Notes (Signed)
Patient ID: Tina [name], female   DOB: 06-Sep-1970, 44 y.o.   MRN: [id number] [facility] MD Progress Note  12/19/2014 2:44 PM Tina [name]  MRN:  [id number] Subjective:   Reports improvement insofar as depression and also reports gradually subsiding dizziness .  She is concerned due to new onset rash, noted exclusively around face. Denies any rash in other areas of her body , no mucosal compromise, no systemic symptoms such as chills, fever.    Objective:  Pt case discussed with treatment team and patient seen As discussed with staff, patient 's improvement has been noticeable over recent days - she is up from bed more often, walking better, not tentatively as she had been recently, psychomotor retardation and severe blunting of affect that she had been  Presenting with are now subsiding and today presents with a much more engaged and reactive affect . NP ( [name]) and myself have examined rash. Patient does have skin compromise on face/forehead. As noted denies any other area of skin compromise and denies any mucosal involvement or any systemic symptoms. She appears to have some new onset acneic lesions particularly around mouth, chin area, and mild erythema on forehead area. Patient has been on Lamictal for years, and denies any history of  Rash on this medication.  Vitals improving, BP still trends low, but clearly much better than prior readings, and patient has less reactive tachycardia upon standing.  She denies suicidal ideations at this time. Due to ongoing depression, and significant compromise of her ability to work during this depression episode , she is wanting to take time off work. 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 She remains interested in IOP level of care after discharge. With patient's express consent and in her presence I spoke with [facility] / Dr. Lucianne Muss, to discuss case and set up an  appointment for her to see him. Also , patient does not have established PCP, and has been going to Urgent Care if needed . Treatment team working on helping to establish PCP follow up in order to get more comprehensive management .     Principal Problem: Bipolar I disorder, current or most recent episode depressed, in partial remission (HCC) Diagnosis:   Patient Active Problem List   Diagnosis Date Noted  . Affective psychosis, bipolar (HCC) [F31.9]   . Orthostatic hypotension [I95.1] 12/12/2014  . Bipolar disorder, current episode depressed, severe, without psychotic features (HCC) [F31.4]   . Bipolar affective disorder, current episode severe (HCC) [F31.9] 12/02/2014  . Bipolar I disorder, current or most recent episode depressed, in partial remission (HCC) [F31.75] 07/11/2014  . Bipolar 1 disorder, mixed (HCC) [F31.60] 06/27/2014  . Disease of thyroid gland [E07.9] 06/27/2014  . Anxiety [F41.9] 06/27/2014  . 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 Bipolar 1 disorder, depressed, partial remission (HCC) [F31.75] 06/27/2014  . 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 Alcohol use disorder, severe, dependence (HCC) [F10.20] 06/24/2014  . Bipolar disorder, unspecified (HCC) [F31.9] 02/16/2013  . Adrenal insufficiency (HCC) [E27.40]   . Acne [L70.9]   . Cellulitis [L03.90]   . Hx of Lyme disease [Z86.19]   . Headache [R51]   . 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 Bipolar I disorder, most recent episode (or current) manic (HCC) [F31.10] 08/29/2012  . Unspecified hypothyroidism [E03.9] 12/23/2010  . Myalgia [M79.1] 12/23/2010  . Fatigue [  R53.83] 12/23/2010  . Unspecified vitamin deficiency [E56.9] 12/23/2010  . Depression [F32.9]    Total Time spent with patient:  20 minutes   Past Medical History:  Past Medical History  Diagnosis Date  . History of chicken pox   . Hypothyroidism   .  Genital warts   . Depression   . Bipolar 2 disorder   . Adrenal insufficiency   . Acne   . Cellulitis   . Hx of Lyme disease   . Headache     Past Surgical History  Procedure Laterality Date  . Tonsillectomy    . Cesarean section  2003, 2007, 2011  . Umbilical hernia repair    . Inguinal hernia repair Bilateral    Family History:  Family History  Problem Relation Age of Onset  . Heart disease Other     Grandparent  . Alcohol abuse Paternal Grandmother    Social History:  History  Alcohol Use  . 7.2 - 10.8 oz/week  . 2-8 Cans of beer, 10 Shots of liquor per week    Comment: social drinker-few drinks a week      History  Drug Use No    Social History   Social History  . Marital Status: Married    Spouse Name: N/A  . Number of Children: N/A  . Years of Education: 16   Occupational History  . HOMEMAKER    Social History Main Topics  . Smoking status: Current Every Day Smoker -- 1.50 packs/day for 29 years    Types: Cigarettes  . Smokeless tobacco: None  . Alcohol Use: 7.2 - 10.8 oz/week    2-8 Cans of beer, 10 Shots of liquor per week     Comment: social drinker-few drinks a week   . Drug Use: No  . Sexual Activity: Not Currently   Other Topics Concern  . None   Social History Narrative   Regular exercise-yes   Additional History:    Sleep: good   Appetite: improving    Musculoskeletal: Strength & Muscle Tone: within normal limits Gait & Station: walks slowly .  Patient leans: N/A  Psychiatric Specialty Exam: Physical Exam  Review of Systems  Psychiatric/Behavioral: Positive for depression. Negative for suicidal ideas and hallucinations. The patient is nervous/anxious.   All other systems reviewed and are negative.  no chest pain, no shortness of breath, no  Nausea ,  No vomiting,  (+) dizziness ( but improving )  . No falls . Complains of blurry vision  Blood pressure 92/71, pulse 99, temperature 98.5 F (36.9 C), temperature source Oral,  resp. rate 16, height 5" (0.127 m), weight 109 lb (49.442 kg), SpO2 97 %.Body mass index is 3,065.41 kg/(m^2).  General Appearance: improved grooming   Eye Contact::  Good  Speech:  Normal Rate  Volume:  improved today  Mood:    Depressed, but clearly improved compared to admission  Affect:   Is gradually improving - still anxious  Thought Process:  Goal Directed and Linear  Orientation:  Full (Time, Place, and Person)  Thought Content:  denies hallucinations, no delusions , not internally preoccupied    Suicidal Thoughts:  No denies any  SI, denies any plan or intention of hurting self on unit and contracts for safety   Homicidal Thoughts:  No  Memory:  recent and remote grossly intact   Judgement:  Fair  Insight:  Present  Psychomotor Activity:  Improving, more visible on unit .  Concentration:  Good  Recall:  Good  Fund of Knowledge:Good  Language: Good  Akathisia:  Negative  Handed:  Right  AIMS (if indicated):     Assets:  Desire for Improvement Housing Resilience Vocational/Educational  ADL's:   Improved   Cognition: WNL  Sleep:  Number of Hours: 6.25     Current Medications: Current Facility-Administered Medications  Medication Dose Route Frequency Provider Last Rate Last Dose  . acetaminophen (TYLENOL) tablet 650 mg  650 mg Oral Q6H PRN Kerry Hough, PA-C      . alum & mag hydroxide-simeth (MAALOX/MYLANTA) 200-200-20 MG/5ML suspension 30 mL  30 mL Oral Q4H PRN Kerry Hough, PA-C      . feeding supplement (ENSURE ENLIVE) (ENSURE ENLIVE) liquid 237 mL  237 mL Oral BID BM Rockey Situ Sharon Rubis, MD   237 mL at 12/19/14 0819  . hydrOXYzine (ATARAX/VISTARIL) tablet 25 mg  25 mg Oral QHS PRN Worthy Flank, NP   25 mg at 12/18/14 2258  . lamoTRIgine (LAMICTAL) tablet 200 mg  200 mg Oral QHS Kerry Hough, PA-C   200 mg at 12/18/14 2258  . lithium carbonate capsule 150 mg  150 mg Oral BID WC Craige Cotta, MD   150 mg at 12/18/14 1712  . midodrine (PROAMATINE) tablet  5 mg  5 mg Oral BID WC Marinda Elk, MD   5 mg at 12/19/14 0820  . nicotine polacrilex (NICORETTE) gum 2 mg  2 mg Oral PRN Craige Cotta, MD   2 mg at 12/04/14 1320  . thyroid (ARMOUR) tablet 120 mg  120 mg Oral QAC breakfast Kerry Hough, PA-C   120 mg at 12/19/14 4098  . topiramate (TOPAMAX) tablet 25 mg  25 mg Oral BID Craige Cotta, MD   25 mg at 12/19/14 0819  . venlafaxine XR (EFFEXOR-XR) 24 hr capsule 225 mg  225 mg Oral Q breakfast Craige Cotta, MD   225 mg at 12/19/14 1191    Lab Results:  Results for orders placed or performed during the hospital encounter of 12/02/14 (from the past 48 hour(s))  Lithium level     Status: None   Collection Time: 12/18/14  6:25 AM  Result Value Ref Range   Lithium Lvl 0.64 0.60 - 1.20 mmol/L    Comment: Performed at [facility]    Physical Findings: AIMS: Facial and Oral Movements Muscles of Facial Expression: None, normal Lips and Perioral Area: None, normal Jaw: None, normal Tongue: None, normal,Extremity Movements Upper (arms, wrists, hands, fingers): None, normal Lower (legs, knees, ankles, toes): None, normal, Trunk Movements Neck, shoulders, hips: None, normal, Overall Severity Severity of abnormal movements (highest score from questions above): None, normal Incapacitation due to abnormal movements: None, normal Patient's awareness of abnormal movements (rate only patient's report): No Awareness, Dental Status Current problems with teeth and/or dentures?: No Does patient usually wear dentures?: No  CIWA:  CIWA-Ar Total: 1 COWS:  COWS Total Score: 2   Assessment -    Slow but gradual improvement and at this time she is noticeably better- less psychomotor retardation, increased range of affect, improved BP readings, less dizziness, improved gait . These improvements are relative to the previous severity of symptoms and she still reports feeling unable to function at work / independently without  family support. She is tolerating medications better, and is reporting improved dizziness , which had been significant /severe, and which had impaired her gait . She is  Reporting rash. It is facial only,  and appears acneic , denies other areas of skin or mucosas being affected and has no systemic symptoms. She thinks it is related to Lithium , which is now D/Cd . It is unlikely that this represents a medication rash. Patient has been on  Lamictal for years at high dose, and has not had any history of rash.     Treatment Plan Summary: Daily contact with patient to assess and evaluate symptoms and progress in treatment, Medication management, Plan inpatient admission and medications as below  Continue   Topamax   25 mgr BID for migraine prophylaxis and for mood disorder.  Continue Midodrine for management of low blood pressure, orthostasis D/C Lithium - patient does not want to take it and reports side effects  Continue Effexor XR   225   mgrs QDAY for  Ongoing depression  Continue Lamictal 200 mgrs QDAY for mood disorder  Will monitor for any worsening rash . As noted, at this time not felt to be medication allergy related rash, may be acne related to recent lithium trial.  Encourage ongoing group, milieu participation to work on stressors, coping skills, enhance mood  Continue Armour Thyroid for Hypothyroidism. Appreciate Hospitalist management / care .  Treatment team working on disposition planning-  Patient interested in living with mother, taking FMLA from work, and attend IOP I have made patient appointment to see Dr. Lucianne Muss , endocrinologist  On Monday 10/10 at 4, 30 PM . ( to follow up on thyroid management and work up as needed to rule out adrenal compromise, insufficiency)     Nehemiah Massed, MD 12/19/2014, 2:44 PM

## 2014-12-19 NOTE — Progress Notes (Signed)
D.  Pt pleasant on approach, denies complaints at this time.  Positive for evening wrap up group, interacting appropriately with peers on the unit.  Denies SI/HI/hallucinations at this time.  Hopeful for discharge soon.  A.  Support and encouragement offered  R. Pt remains safe on the unit, will continue to monitor.

## 2014-12-19 NOTE — Progress Notes (Signed)
Pt stated that she had a good day. Her goal for tomorrow is to work on discharge planning.

## 2014-12-20 NOTE — Progress Notes (Signed)
D.  Pt pleasant on approach, no complaints voiced.  Positive for evening wrap up group, interacting appropriately with peers on the unit.  Denies SI/HI/hallucinations at this time.  A.  Support and encouragement offered  R.  Pt remains safe on unit, will continue to monitor.

## 2014-12-20 NOTE — Progress Notes (Signed)
.  Psychoeducational Group Note    Date: 12/20/2014 Time:  0930    Goal Setting Purpose of Group: To be able to set a goal that is measurable and that can be accomplished in one day Participation Level:  Active  Participation Quality:  Appropriate  Affect:  Appropriate  Cognitive:  Oriented  Insight:  Improving  Engagement in Group:  Engaged  Additional Comments:  Pt attended the group and participated.  Maria Gallicchio A 

## 2014-12-20 NOTE — [facility] Group Notes (Signed)
[facility] Group Notes:  (Clinical Social Work)  12/20/2014     1:15-2:15PM  Summary of Progress/Problems:   The main focus of today's process group was to learn how to use a decisional balance exercise to move forward in the Stages of Change.  Patients listed needs on the whiteboard and unhealthy coping techniques often used to fill needs.  Motivational Interviewing and the whiteboard were utilized to help patients explore in depth the perceived benefits and costs of unhealthy coping techniques, as well as the  benefits and costs of replacing that with a healthy coping skills.   The patient expressed understanding of other patients' statements although did not often initiate them herself.  We discussed coping techniques to "live in the moment" , i.e., mindfulness, and CSW gave examples and homework.  We also talked at length about filtering negative self-talk and how to deal with people making [place] angry, and emphasis was placed on finding a therapist to pursue the learning of these skills outside of hospital.  Type of Therapy:  Group Therapy - Process   Participation Level:  Active  Participation Quality:  Appropriate, Attentive and Sharing  Affect:  Blunted  Cognitive:  Alert and Appropriate  Insight:  Engaged  Engagement in Therapy:  Engaged  Modes of Intervention:  Education, Motivational Interviewing  Ambrose Mantle, LCSW 12/20/2014, 4:02 PM

## 2014-12-20 NOTE — Progress Notes (Signed)
Patient ID: Tina [name], female   DOB: 01-04-1971, 44 y.o.   MRN: [id number] [facility] MD Progress Note  12/20/2014 4:20 PM Tina [name]  MRN:  [id number] Subjective:   Reports improvement in moods as well as depression. Denies any side effects of the medications at this time. Denies any rash in other areas of her body , no mucosal compromise, no systemic symptoms such as chills, fever. She reports enjoying group this morning.  Denies si/hi/avh.    Objective:  Pt case discussed with treatment team and patient seen As discussed with staff, patient 's improvement has been noticeable over recent days - she is up from bed more often, walking better, not tentatively as she had been recently, psychomotor retardation and severe blunting of affect that she had been  Presenting with are now subsiding and today presents with a much more engaged and reactive affect . No progress or changes in her rash. Patient does have skin compromise on face/forehead. As noted denies any other area of skin compromise and denies any mucosal involvement or any systemic symptoms. She appears to have some new onset acneic lesions particularly around mouth, chin area, and mild erythema on forehead area. She denies suicidal ideations at this time. Due to ongoing depression, and significant compromise of her ability to work during this depression episode , she is wanting to take time off work. 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 She remains interested in IOP level of care after discharge.   Principal Problem: Bipolar I disorder, current or most recent episode depressed, in partial remission (HCC) Diagnosis:   Patient Active Problem List   Diagnosis Date Noted  . Affective psychosis, bipolar (HCC) [F31.9]   . Orthostatic hypotension [I95.1] 12/12/2014  . Bipolar disorder, current episode depressed, severe, without psychotic features (HCC)  [F31.4]   . Bipolar affective disorder, current episode severe (HCC) [F31.9] 12/02/2014  . Bipolar I disorder, current or most recent episode depressed, in partial remission (HCC) [F31.75] 07/11/2014  . Bipolar 1 disorder, mixed (HCC) [F31.60] 06/27/2014  . Disease of thyroid gland [E07.9] 06/27/2014  . Anxiety [F41.9] 06/27/2014  . 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 Bipolar 1 disorder, depressed, partial remission (HCC) [F31.75] 06/27/2014  . 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 Alcohol use disorder, severe, dependence (HCC) [F10.20] 06/24/2014  . Bipolar disorder, unspecified (HCC) [F31.9] 02/16/2013  . Adrenal insufficiency (HCC) [E27.40]   . Acne [L70.9]   . Cellulitis [L03.90]   . Hx of Lyme disease [Z86.19]   . Headache [R51]   . 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 Bipolar I disorder, most recent episode (or current) manic (HCC) [F31.10] 08/29/2012  . Unspecified hypothyroidism [E03.9] 12/23/2010  . Myalgia [M79.1] 12/23/2010  . Fatigue [R53.83] 12/23/2010  . Unspecified vitamin deficiency [E56.9] 12/23/2010  . Depression [F32.9]    Total Time spent with patient:  20 minutes   Past Medical History:  Past Medical History  Diagnosis Date  . History of chicken pox   . Hypothyroidism   . Genital warts   . Depression   . Bipolar 2 disorder   . Adrenal insufficiency   . Acne   . Cellulitis   . Hx of Lyme disease   . Headache     Past Surgical History  Procedure Laterality Date  . Tonsillectomy    . Cesarean section  2003, 2007, 2011  . Umbilical hernia repair    . Inguinal hernia repair Bilateral    Family History:  Family History  Problem Relation Age of Onset  . Heart disease Other     Grandparent  . Alcohol abuse Paternal Grandmother    Social History:  History  Alcohol Use  . 7.2 - 10.8 oz/week  . 2-8 Cans of beer, 10 Shots of liquor per week    Comment:  social drinker-few drinks a week      History  Drug Use No    Social History   Social History  . Marital Status: Married    Spouse Name: N/A  . Number of Children: N/A  . Years of Education: 16   Occupational History  . HOMEMAKER    Social History Main Topics  . Smoking status: Current Every Day Smoker -- 1.50 packs/day for 29 years    Types: Cigarettes  . Smokeless tobacco: None  . Alcohol Use: 7.2 - 10.8 oz/week    2-8 Cans of beer, 10 Shots of liquor per week     Comment: social drinker-few drinks a week   . Drug Use: No  . Sexual Activity: Not Currently   Other Topics Concern  . None   Social History Narrative   Regular exercise-yes   Additional History:    Sleep: good   Appetite: improving    Musculoskeletal: Strength & Muscle Tone: within normal limits Gait & Station: walks slowly .  Patient leans: N/A  Psychiatric Specialty Exam: Physical Exam  Review of Systems  Psychiatric/Behavioral: Positive for depression. Negative for suicidal ideas and hallucinations. The patient is nervous/anxious.   All other systems reviewed and are negative.  no chest pain, no shortness of breath, no  Nausea ,  No vomiting,  (+) dizziness ( but improving )  . No falls . Complains of blurry vision  Blood pressure 77/61, pulse 98, temperature 98.6 F (37 C), temperature source Oral, resp. rate 16, height 5" (0.127 m), weight 49.442 kg (109 lb), SpO2 97 %.Body mass index is 3,065.41 kg/(m^2).  General Appearance: Casual and Fairly Groomed  Eye Contact::  Good  Speech:  Normal Rate  Volume:  Normal  Mood:    Depressed, but clearly improved compared to admission  Affect:   Is gradually improving - still anxious  Thought Process:  Goal Directed and Linear  Orientation:  Full (Time, Place, and Person)  Thought Content:  denies hallucinations, no delusions , not internally preoccupied    Suicidal Thoughts:  No denies any  SI, denies any plan or intention of hurting self on unit  and contracts for safety   Homicidal Thoughts:  No  Memory:  recent and remote grossly intact   Judgement:  Fair  Insight:  Present  Psychomotor Activity:  Improving, more visible on unit .  Concentration:  Good  Recall:  Good  Fund of Knowledge:Good  Language: Good  Akathisia:  Negative  Handed:  Right  AIMS (if indicated):     Assets:  Desire for Improvement Housing Resilience Vocational/Educational  ADL's:   Improved   Cognition: WNL  Sleep:  Number of Hours: 6.25     Current Medications: Current Facility-Administered Medications  Medication Dose Route Frequency Provider Last Rate Last Dose  . acetaminophen (TYLENOL) tablet 650 mg  650 mg Oral Q6H PRN Kerry Hough, PA-C      . alum & mag hydroxide-simeth (MAALOX/MYLANTA) 200-200-20 MG/5ML suspension 30 mL  30 mL Oral Q4H PRN  Kerry Hough, PA-C      . feeding supplement (ENSURE ENLIVE) (ENSURE ENLIVE) liquid 237 mL  237 mL Oral BID BM Rockey Situ Cobos, MD   237 mL at 12/20/14 1523  . hydrOXYzine (ATARAX/VISTARIL) tablet 25 mg  25 mg Oral QHS PRN Worthy Flank, NP   25 mg at 12/19/14 2240  . lamoTRIgine (LAMICTAL) tablet 200 mg  200 mg Oral QHS Kerry Hough, PA-C   200 mg at 12/19/14 2239  . midodrine (PROAMATINE) tablet 5 mg  5 mg Oral BID WC Marinda Elk, MD   5 mg at 12/20/14 1150  . nicotine polacrilex (NICORETTE) gum 2 mg  2 mg Oral PRN Craige Cotta, MD   2 mg at 12/04/14 1320  . thyroid (ARMOUR) tablet 120 mg  120 mg Oral QAC breakfast Kerry Hough, PA-C   120 mg at 12/20/14 0606  . topiramate (TOPAMAX) tablet 25 mg  25 mg Oral BID Craige Cotta, MD   25 mg at 12/20/14 1150  . venlafaxine XR (EFFEXOR-XR) 24 hr capsule 225 mg  225 mg Oral Q breakfast Craige Cotta, MD   225 mg at 12/20/14 1150    Lab Results:  No results found for this or any previous visit (from the past 48 hour(s)).  Physical Findings: AIMS: Facial and Oral Movements Muscles of Facial Expression: None, normal Lips and  Perioral Area: None, normal Jaw: None, normal Tongue: None, normal,Extremity Movements Upper (arms, wrists, hands, fingers): None, normal Lower (legs, knees, ankles, toes): None, normal, Trunk Movements Neck, shoulders, hips: None, normal, Overall Severity Severity of abnormal movements (highest score from questions above): None, normal Incapacitation due to abnormal movements: None, normal Patient's awareness of abnormal movements (rate only patient's report): No Awareness, Dental Status Current problems with teeth and/or dentures?: No Does patient usually wear dentures?: No  CIWA:  CIWA-Ar Total: 0 COWS:  COWS Total Score: 2   Assessment -    Slow but gradual improvement and at this time she is noticeably better- less psychomotor retardation, increased range of affect, improved BP readings, less dizziness, improved gait . These improvements are relative to the previous severity of symptoms and she still reports feeling unable to function at work / independently without family support. She is tolerating medications better, and is reporting improved dizziness , which had been significant /severe, and which had impaired her gait . She is  Reporting rash. It is facial only, and appears acneic , denies other areas of skin or mucosas being affected and has no systemic symptoms. She thinks it is related to Lithium , which is now D/Cd . It is unlikely that this represents a medication rash. Patient has been on  Lamictal for years at high dose, and has not had any history of rash.     Treatment Plan Summary: Daily contact with patient to assess and evaluate symptoms and progress in treatment, Medication management, Plan inpatient admission and medications as below  Continue   Topamax   25 mgr BID for migraine prophylaxis and for mood disorder.  Continue Midodrine for management of low blood pressure, orthostasis D/C Lithium - patient does not want to take it and reports side effects  Continue  Effexor XR   225   mgrs QDAY for  Ongoing depression  Continue Lamictal 200 mgrs QDAY for mood disorder  Will monitor for any worsening rash . As noted, at this time not felt to be medication allergy related rash, may be  acne related to recent lithium trial. Will start Clindamycin 1% lotion for topical rash. Apply to affected area BID. Encourage ongoing group, milieu participation to work on stressors, coping skills, enhance mood  Continue Armour Thyroid for Hypothyroidism. Appreciate Hospitalist management / care .  Treatment team working on disposition planning-  Patient interested in living with mother, taking FMLA from work, and attend IOP I have made patient appointment to see Dr. Lucianne Muss , endocrinologist  On Monday 10/10 at 4, 30 PM . ( to follow up on thyroid management and work up as needed to rule out adrenal compromise, insufficiency)     Truman Hayward, FNP-BC  12/20/2014, 4:20 PM

## 2014-12-20 NOTE — Progress Notes (Signed)
Pt stated that she had a good day. Some positive coping skills that she has includes reading and going for walks. Pt wants to learn how to get her depression under control before it gets out of control again.

## 2014-12-20 NOTE — Progress Notes (Signed)
D) Pt has slept late today, but otherwise has been out in the dayroom interacting with her peers. Affect is brighter with interaction. Affect and mood are appropriate. Pt rates her depression at a 3, hopelessness at a 3 and her anxiety at a 2. Denies SI and HI. A) Given support, reassurance and praise. Encouragement provided. Provided with a 1:1. R) Denies SI and HI.

## 2014-12-20 NOTE — Progress Notes (Signed)
Psychoeducational Group Note  Psychoeducational Group Note  Date: 12/20/2014 Time: 1030 Group Topic/Focus:  Gratefulness:  The focus of this group is to help patients identify what two things they are most grateful for in their lives. What helps ground them and to center them on their work to their recovery.  Participation Level:  Active  Participation Quality:  Appropriate  Affect:  Depressed  Cognitive:  Oriented  Insight:  Improving  Engagement in Group:  Engaged  Additional Comments:  Pt attending the group and partisipating  Dione Housekeeper

## 2014-12-21 MED ORDER — CLINDAMYCIN PHOSPHATE 1 % EX SOLN
Freq: Two times a day (BID) | CUTANEOUS | Status: DC
  Administered 2014-12-21 – 2014-12-22 (×2): via TOPICAL
  Filled 2014-12-21: qty 30

## 2014-12-21 NOTE — Progress Notes (Signed)
Patient ID: Tina [name], female   DOB: 08/28/1970, 44 y.o.   MRN: [id number] [facility] MD Progress Note  12/21/2014 11:00 AM [name]  MRN:  [id number] Subjective:   Reports improvement in moods as well as depression.Currenlty rates her depression 2/10, anxiety 4-5/10, and hopelessness 2/10. "I am nervous about going home, and getting back into the swing of things. I am just nervous."  Denies any side effects of the medications at this time. Denies any rash in other areas of her body , no mucosal compromise, no systemic symptoms such as chills, fever.  Denies si/hi/avh. Upon discharge she plans to not isolate as much. She has been practicing deep breathing techniques.    Objective:  Pt case discussed with treatment team and patient seen As discussed with staff, patient 's improvement has been noticeable over recent days. Presenting with are now subsiding and today presents with a much more engaged and reactive affect . Her rash on her face has improved. Patient does have skin compromise on face/forehead. She appears to have some new onset acneic lesions particularly around mouth, chin area, and mild erythema on forehead area. She denies suicidal ideations at this time. Due to ongoing depression, and significant compromise of her ability to work during this depression episode , she is wanting to take time off work. 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  She remains interested in IOP level of care after discharge.She did seem to tense when talking about discharge plans, her words became slow and pressured.    Principal Problem: Bipolar I disorder, current or most recent episode depressed, in partial remission (HCC) Diagnosis:   Patient Active Problem List   Diagnosis Date Noted  . Affective psychosis, bipolar (HCC) [F31.9]   . Orthostatic hypotension [I95.1] 12/12/2014  . Bipolar disorder, current episode  depressed, severe, without psychotic features (HCC) [F31.4]   . Bipolar affective disorder, current episode severe (HCC) [F31.9] 12/02/2014  . Bipolar I disorder, current or most recent episode depressed, in partial remission (HCC) [F31.75] 07/11/2014  . Bipolar 1 disorder, mixed (HCC) [F31.60] 06/27/2014  . Disease of thyroid gland [E07.9] 06/27/2014  . Anxiety [F41.9] 06/27/2014  . 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 Bipolar 1 disorder, depressed, partial remission (HCC) [F31.75] 06/27/2014  . 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 Alcohol use disorder, severe, dependence (HCC) [F10.20] 06/24/2014  . Bipolar disorder, unspecified (HCC) [F31.9] 02/16/2013  . Adrenal insufficiency (HCC) [E27.40]   . Acne [L70.9]   . Cellulitis [L03.90]   . Hx of Lyme disease [Z86.19]   . Headache [R51]   . 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 Bipolar I disorder, most recent episode (or current) manic (HCC) [F31.10] 08/29/2012  . Unspecified hypothyroidism [E03.9] 12/23/2010  . Myalgia [M79.1] 12/23/2010  . Fatigue [R53.83] 12/23/2010  . Unspecified vitamin deficiency [E56.9] 12/23/2010  . Depression [F32.9]    Total Time spent with patient:  20 minutes   Past Medical History:  Past Medical History  Diagnosis Date  . History of chicken pox   . Hypothyroidism   . Genital warts   . Depression   . Bipolar 2 disorder   . Adrenal insufficiency   . Acne   . Cellulitis   . Hx of Lyme disease   . Headache     Past Surgical History  Procedure Laterality Date  .  Tonsillectomy    . Cesarean section  2003, 2007, 2011  . Umbilical hernia repair    . Inguinal hernia repair Bilateral    Family History:  Family History  Problem Relation Age of Onset  . Heart disease Other     Grandparent  . Alcohol abuse Paternal Grandmother    Social History:  History  Alcohol Use  . 7.2 - 10.8 oz/week  . 2-8  Cans of beer, 10 Shots of liquor per week    Comment: social drinker-few drinks a week      History  Drug Use No    Social History   Social History  . Marital Status: Married    Spouse Name: N/A  . Number of Children: N/A  . Years of Education: 16   Occupational History  . HOMEMAKER    Social History Main Topics  . Smoking status: Current Every Day Smoker -- 1.50 packs/day for 29 years    Types: Cigarettes  . Smokeless tobacco: None  . Alcohol Use: 7.2 - 10.8 oz/week    2-8 Cans of beer, 10 Shots of liquor per week     Comment: social drinker-few drinks a week   . Drug Use: No  . Sexual Activity: Not Currently   Other Topics Concern  . None   Social History Narrative   Regular exercise-yes   Additional History:    Sleep: good   Appetite: improving    Musculoskeletal: Strength & Muscle Tone: within normal limits Gait & Station: walks slowly .  Patient leans: N/A  Psychiatric Specialty Exam: Physical Exam  Review of Systems  Psychiatric/Behavioral: Positive for depression. Negative for suicidal ideas and hallucinations. The patient is nervous/anxious.   All other systems reviewed and are negative.  no chest pain, no shortness of breath, no  Nausea ,  No vomiting,  (+) dizziness ( but improving )  . No falls . Complains of blurry vision  Blood pressure 95/66, pulse 88, temperature 98.5 F (36.9 C), temperature source Oral, resp. rate 16, height 5" (0.127 m), weight 49.442 kg (109 lb), SpO2 97 %.Body mass index is 3,065.41 kg/(m^2).  General Appearance: Casual and Fairly Groomed  Eye Contact::  Good  Speech:  Normal Rate  Volume:  Normal  Mood:    Depressed, but clearly improved compared to admission  Affect:   Appropriate and congruent  Thought Process:  Goal Directed and Linear  Orientation:  Full (Time, Place, and Person)  Thought Content:  denies hallucinations, no delusions , not internally preoccupied    Suicidal Thoughts:  No denies any  SI, denies any  plan or intention of hurting self on unit and contracts for safety   Homicidal Thoughts:  No  Memory:  recent and remote grossly intact   Judgement:  Fair  Insight:  Present  Psychomotor Activity:  Improving, more visible on unit .  Concentration:  Good  Recall:  Good  Fund of Knowledge:Good  Language: Good  Akathisia:  Negative  Handed:  Right  AIMS (if indicated):     Assets:  Desire for Improvement Housing Resilience Vocational/Educational  ADL's:   Improved   Cognition: WNL  Sleep:  Number of Hours: 6.25     Current Medications: Current Facility-Administered Medications  Medication Dose Route Frequency Provider Last Rate Last Dose  . acetaminophen (TYLENOL) tablet 650 mg  650 mg Oral Q6H PRN Kerry Hough, PA-C      . alum & mag hydroxide-simeth (MAALOX/MYLANTA) 200-200-20 MG/5ML suspension 30 mL  30 mL Oral Q4H PRN Kerry Hough, PA-C      . feeding supplement (ENSURE ENLIVE) (ENSURE ENLIVE) liquid 237 mL  237 mL Oral BID BM Rockey Situ Cobos, MD   237 mL at 12/21/14 1040  . hydrOXYzine (ATARAX/VISTARIL) tablet 25 mg  25 mg Oral QHS PRN Worthy Flank, NP   25 mg at 12/20/14 2234  . lamoTRIgine (LAMICTAL) tablet 200 mg  200 mg Oral QHS Kerry Hough, PA-C   200 mg at 12/20/14 2234  . midodrine (PROAMATINE) tablet 5 mg  5 mg Oral BID WC Marinda Elk, MD   5 mg at 12/21/14 0859  . nicotine polacrilex (NICORETTE) gum 2 mg  2 mg Oral PRN Craige Cotta, MD   2 mg at 12/04/14 1320  . thyroid (ARMOUR) tablet 120 mg  120 mg Oral QAC breakfast Kerry Hough, PA-C   120 mg at 12/21/14 1610  . topiramate (TOPAMAX) tablet 25 mg  25 mg Oral BID Craige Cotta, MD   25 mg at 12/21/14 0859  . venlafaxine XR (EFFEXOR-XR) 24 hr capsule 225 mg  225 mg Oral Q breakfast Craige Cotta, MD   225 mg at 12/21/14 9604    Lab Results:  No results found for this or any previous visit (from the past 48 hour(s)).  Physical Findings: AIMS: Facial and Oral Movements Muscles of  Facial Expression: None, normal Lips and Perioral Area: None, normal Jaw: None, normal Tongue: None, normal,Extremity Movements Upper (arms, wrists, hands, fingers): None, normal Lower (legs, knees, ankles, toes): None, normal, Trunk Movements Neck, shoulders, hips: None, normal, Overall Severity Severity of abnormal movements (highest score from questions above): None, normal Incapacitation due to abnormal movements: None, normal Patient's awareness of abnormal movements (rate only patient's report): No Awareness, Dental Status Current problems with teeth and/or dentures?: No Does patient usually wear dentures?: No  CIWA:  CIWA-Ar Total: 0 COWS:  COWS Total Score: 2   Assessment -    Slow but gradual improvement and at this time she is noticeably better- less psychomotor retardation, increased range of affect, improved BP readings, less dizziness, improved gait . These improvements are relative to the previous severity of symptoms and she still reports feeling unable to function at work / independently without family support. She is tolerating medications better, and is reporting improved dizziness , which had been significant /severe, and which had impaired her gait . She is  Reporting rash. It is facial only, and appears acneic , denies other areas of skin or mucosas being affected and has no systemic symptoms. She thinks it is related to Lithium , which is now D/Cd . It is unlikely that this represents a medication rash. Patient has been on  Lamictal for years at high dose, and has not had any history of rash.     Treatment Plan Summary: Daily contact with patient to assess and evaluate symptoms and progress in treatment, Medication management, Plan inpatient admission and medications as below  Continue   Topamax   25 mgr BID for migraine prophylaxis and for mood disorder.  Continue Midodrine for management of low blood pressure, orthostasis D/C Lithium - patient does not want to take it  and reports side effects  Continue Effexor XR   225   mgrs QDAY for  Ongoing depression  Continue Lamictal 200 mgrs QDAY for mood disorder  Will monitor for any worsening rash . As noted, at this time not felt to be medication  allergy related rash, may be acne related to recent lithium trial. Will start Clindamycin 1% lotion for topical rash. Apply to affected area BID. Encourage ongoing group, milieu participation to work on stressors, coping skills, enhance mood  Continue Armour Thyroid for Hypothyroidism. Appreciate Hospitalist management / care .  Treatment team working on disposition planning-  Patient interested in living with mother, taking FMLA from work, and attend IOP I have made patient appointment to see Dr. Lucianne Muss , endocrinologist  On Monday 10/10 at 4, 30 PM . ( to follow up on thyroid management and work up as needed to rule out adrenal compromise, insufficiency)     STARKES, Atalya Dano S, FNP-BC  12/21/2014, 11:00 AM
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
## 2014-12-21 NOTE — Progress Notes (Signed)
[facility] Group Notes:  (Nursing/MHT/Case Management/Adjunct)  Date:  12/21/2014  Time:  2100  Type of Therapy:  wrap up group  Participation Level:  Active  Participation Quality:  Appropriate, Attentive, Sharing and Supportive  Affect:  Appropriate  Cognitive:  Alert  Insight:  Improving  Engagement in Group:  Engaged  Modes of Intervention:  Clarification, Education and Support  Summary of Progress/Problems: Pt shares plans to stop living in the past and start living for the future. When asked about one thing she would like to see for herself in the future, pt reports, a job that she is passionate about. Pt names her friend Lowella Bandy and ZOXWR'U mom to be biggest supports.   Shelah Lewandowsky 12/21/2014, 10:56 PM

## 2014-12-21 NOTE — Progress Notes (Signed)
Patient ID: Tina [name], female   DOB: 08-Oct-1970, 44 y.o.   MRN: [id number] D: Patient alert and cooperative in dayroom on approach. Pt reports her day was well and scheduled for discharge tomorrow. Mood and affect appeared blunted.  Pt reports she is tolerating medication well. Pt denies SI/HI/AVH and pain. Pt attended and participated in evening wrap up group. Cooperative with assessment.   A: Met with pt 1:1. Medications administered as prescribed. Support and encouragement provided. Pt encouraged to discuss feelings and come to staff with any question or concerns.   R: Patient remains safe and complaint with medications.

## 2014-12-21 NOTE — [facility] Group Notes (Signed)
[facility] Group Notes:  (Nursing/MHT/Case Management/Adjunct)  Date:  12/21/2014  Time:  1045  Type of Therapy:  Nurse Education / Life SKills : The group is focused on teaching patients the importance of developing and maintaining healthy support systems  Participation Level:  Active  Participation Quality:  Attentive  Affect:  Appropriate  Cognitive:  Appropriate  Insight:  Appropriate  Engagement in Group:  Engaged  Modes of Intervention:  Clarification and Education  Summary of Progress/Problems:  Tina [name] 12/21/2014, 6:40 PM

## 2014-12-21 NOTE — Progress Notes (Signed)
D:Patient stated she slept good last night with the help of sleep medication. She states her appetite has been fair and her energy level normal. She states her concentration is normal. She rates her depression at a 2, her hopelessness at a 2, and her anxiety at 5. She denies SI/HI/AVH. She denies any pain. Her goal today is to work on discharge planning. She is visible in the milieu seen talking to other patients.  A: Medication is given and encouragement is provided. Education is provided about medication. R: Patient is receptive and cooperative with medication. She also attended groups and participated.

## 2014-12-21 NOTE — Progress Notes (Signed)
Psychoeducational Group Note  Date: 12/21/2014 Time:  0930 Group Topic/Focus:  Gratefulness:  The focus of this group is to help patients identify what two things they are most grateful for in their lives. What helps ground them and to center them on their work to their recovery.  Participation Level:  Active  Participation Quality:  Appropriate  Affect:  Appropriate  Cognitive:  Oriented  Insight:  Improving  Engagement in Group:  Engaged  Additional Comments:  Pt attended and participated.  Ellarose Brandi A  

## 2014-12-22 ENCOUNTER — Ambulatory Visit (INDEPENDENT_AMBULATORY_CARE_PROVIDER_SITE_OTHER): Payer: BLUE CROSS/BLUE SHIELD | Admitting: Endocrinology

## 2014-12-22 VITALS — BP 85/60 | HR 92 | Temp 98.8°F | Resp 14 | Ht 60.0 in | Wt 108.0 lb

## 2014-12-22 DIAGNOSIS — I951 Orthostatic hypotension: Secondary | ICD-10-CM | POA: Diagnosis not present

## 2014-12-22 DIAGNOSIS — R5383 Other fatigue: Secondary | ICD-10-CM

## 2014-12-22 DIAGNOSIS — E039 Hypothyroidism, unspecified: Secondary | ICD-10-CM | POA: Diagnosis not present

## 2014-12-22 MED ORDER — VENLAFAXINE HCL ER 75 MG PO CP24: 225.0000 mg | ORAL_CAPSULE | Freq: Every day | ORAL | Status: DC

## 2014-12-22 MED ORDER — MIDODRINE HCL 5 MG PO TABS: 5.0000 mg | ORAL_TABLET | Freq: Two times a day (BID) | ORAL | Status: DC

## 2014-12-22 MED ORDER — THYROID 120 MG PO TABS: 120.0000 mg | ORAL_TABLET | Freq: Every day | ORAL | Status: DC

## 2014-12-22 MED ORDER — TOPIRAMATE 25 MG PO TABS: 25.0000 mg | ORAL_TABLET | Freq: Two times a day (BID) | ORAL | Status: DC

## 2014-12-22 MED ORDER — NICOTINE POLACRILEX 2 MG MT GUM: 2.0000 mg | CHEWING_GUM | OROMUCOSAL | Status: DC | PRN

## 2014-12-22 MED ORDER — CLINDAMYCIN PHOSPHATE 1 % EX SOLN: Freq: Two times a day (BID) | CUTANEOUS | Status: DC

## 2014-12-22 MED ORDER — HYDROXYZINE HCL 25 MG PO TABS: 25.0000 mg | ORAL_TABLET | Freq: Every evening | ORAL | Status: DC | PRN

## 2014-12-22 MED ORDER — LAMOTRIGINE 200 MG PO TABS: 200.0000 mg | ORAL_TABLET | Freq: Every day | ORAL | Status: DC

## 2014-12-22 NOTE — [facility] Group Notes (Signed)
[facility] LCSW Aftercare Discharge Planning Group Note  12/22/2014 8:45 AM  Participation Quality: Alert, Appropriate and Oriented  Mood/Affect: Appropriate  Depression Rating: 2  Anxiety Rating: 4  Thoughts of Suicide: Pt denies SI/HI  Will you contract for safety? Yes  Current AVH: Pt denies  Plan for Discharge/Comments: Pt attended discharge planning group and actively participated in group. CSW discussed suicide prevention education with the group and encouraged them to discuss discharge planning and any relevant barriers. Pt main concern at this point is work and whether she will be able to get FMLA. She wants to go to the IOP program.  Transportation Means: Pt reports access to transportation  Supports: Mother  Chad Cordial, LCSWA 12/22/2014 9:19 AM

## 2014-12-22 NOTE — [facility] Suicide Risk Assessment (Signed)
[facility] Discharge Suicide Risk Assessment   Demographic Factors:  44 year old divorced female  Total Time spent with patient: 30 minutes  Musculoskeletal: Strength & Muscle Tone: within normal limits Gait & Station: improved ambulation, less dizziness reported  Patient leans: N/A  Psychiatric Specialty Exam: Physical Exam  ROS  Blood pressure 86/56, pulse 124, temperature 98.5 F (36.9 C), temperature source Oral, resp. rate 16, height 5" (0.127 m), weight 109 lb (49.442 kg), SpO2 97 %.Body mass index is 3,065.41 kg/(m^2).  General Appearance: improved grooming   Eye Contact::  Good  Speech:  Normal Rate409  Volume:  Normal  Mood:  improved, less dperessed, states mood is "OK" today and does not endorse sadness or depression today  Affect:  more reactive, smiles at times appropriately  Thought Process:  Goal Directed and Linear  Orientation:  Full (Time, Place, and Person)  Thought Content:  denies hallucinations, no delusions, not internally preoccupied   Suicidal Thoughts:  No denies any self injurious or suicidal ideations and denies any thoughts of violence towards anyone   Homicidal Thoughts:  No  Memory:  recent and remote grossly intact   Judgement:  Other:  improved   Insight:  Present  Psychomotor Activity:  Normal- psychomotor retardation much improved   Concentration:  Good  Recall:  Good  Fund of Knowledge:Good  Language: Good  Akathisia:  Negative  Handed:  Right  AIMS (if indicated):     Assets:  Communication Skills Desire for Improvement Resilience Social Support  Sleep:  Number of Hours: 6  Cognition: WNL  ADL's:   Improved    Have you used any form of tobacco in the last 30 days? (Cigarettes, Smokeless Tobacco, Cigars, and/or Pipes): Yes  Has this patient used any form of tobacco in the last 30 days? (Cigarettes, Smokeless Tobacco, Cigars, and/or Pipes) Yes, A prescription for an FDA-approved tobacco cessation medication was offered at discharge and the  patient refused  Mental Status Per Nursing Assessment::   On Admission:     Current Mental Status by Physician: At this time patient is significantly improved compared to prior - grooming is improved, she is more verbal, with a significantly improved range of affect, describes mood as improved and denies depression today, no thought disorder, no SI or HI, no psychotic symptoms  Loss Factors: Divorce  Historical Factors: History of bipolar disorder   Risk Reduction Factors:   Responsible for children under 83 years of age, Sense of responsibility to family, Living with another person, especially a relative, Positive social support and Positive coping skills or problem solving skills  Continued Clinical Symptoms:  As noted, psychiatric, affective symptoms improved . BP has also improved, although still low at times, she states dizziness and unsteadiness she had been feeling have improved significantly .  At this time does not appear to be in any acute distress and gait has improved   Cognitive Features That Contribute To Risk:  No gross cognitive deficits noted upon discharge. Is alert , attentive, and oriented x 3   Suicide Risk:  Mild:  Suicidal ideation of limited frequency, intensity, duration, and specificity.  There are no identifiable plans, no associated intent, mild dysphoria and related symptoms, good self-control (both objective and subjective assessment), few other risk factors, and identifiable protective factors, including available and accessible social support.  Principal Problem: Bipolar I disorder, current or most recent episode depressed, in partial remission [facility]) Discharge Diagnoses:  Patient Active Problem List   Diagnosis Date Noted  .  Affective psychosis, bipolar (HCC) [F31.9]   . Orthostatic hypotension [I95.1] 12/12/2014  . Bipolar disorder, current episode depressed, severe, without psychotic features (HCC) [F31.4]   . Bipolar affective disorder, current  episode severe (HCC) [F31.9] 12/02/2014  . Bipolar I disorder, current or most recent episode depressed, in partial remission (HCC) [F31.75] 07/11/2014  . Bipolar 1 disorder, mixed (HCC) [F31.60] 06/27/2014  . Disease of thyroid gland [E07.9] 06/27/2014  . Anxiety [F41.9] 06/27/2014  . 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 Bipolar 1 disorder, depressed, partial remission (HCC) [F31.75] 06/27/2014  . 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 Alcohol use disorder, severe, dependence (HCC) [F10.20] 06/24/2014  . Bipolar disorder, unspecified (HCC) [F31.9] 02/16/2013  . Adrenal insufficiency (HCC) [E27.40]   . Acne [L70.9]   . Cellulitis [L03.90]   . Hx of Lyme disease [Z86.19]   . Headache [R51]   . 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 Bipolar I disorder, most recent episode (or current) manic (HCC) [F31.10] 08/29/2012  . Unspecified hypothyroidism [E03.9] 12/23/2010  . Myalgia [M79.1] 12/23/2010  . Fatigue [R53.83] 12/23/2010  . Unspecified vitamin deficiency [E56.9] 12/23/2010  . Depression [F32.9]     Follow-up Information    Follow up with [facility] On 02/02/2015.   Why:  at 1:30pm with Lauris Poag for medication management.   Contact information:   [address], [address] [place], [place] [address] Phone: [phone] Fax: [phone]      Follow up with [facility].   Why:  Staff requests that you call to reschedule your therapy appointment with Verlan Friends when you finish the IOP Program   Contact information:   [address], [place], [place], [address] [phone]  Office [phone]  Fax      Follow up with [facility] On 12/23/2014.   Specialty:  Behavioral Health   Why:  at 8:45am for your first IOP appointment with [place] Sink.   Contact information:   [address] [place] [address] [phone]      Follow up with [facility] On 01/07/2015.   Specialty:  Internal Medicine   Why:  at 9:00am with Dr. Lawerance Bach for your new patient appointment for primary care   Contact information:   [address] [place] [address] [phone]      Plan Of Care/Follow-up recommendations:  Activity:  as tolerated Diet:  Regular Tests:  NA Other:  see below  Is patient on multiple antipsychotic therapies at discharge:  No   Has Patient had three or more failed trials of antipsychotic monotherapy by history:  No  Recommended Plan for Multiple Antipsychotic Therapies: NA   At this time patient is leaving unit in good spirits . States she plans to live with her mother for the time being  Agrees to abstain from driving until symptoms of dizziness completely resolved  Has appointment later today to see Endocrinologist - Dr. Lucianne Muss . Other follow up as above.    COBOS, FERNANDO 12/22/2014, 1:49 PM

## 2014-12-22 NOTE — Progress Notes (Addendum)
D:  Patient denied SI and HI, contracts for safety.  Denied A/V hallucinations.  Denied pain. A:  Medications administered per MD orders.  Emotional support and encouragement given patient. R:  Safety maintained with 15 minute checks. Patient looking forward to today's discharge. Patient's self inventory sheet, patient sleeps good, sleep medication is helpful.  Fair appetite, normal energy level, good concentration.  Rated depression and hopeless 2, anxiety 5.  Denied withdrawals.  Denied SI.  Denied physical problems.  Denied pain.  Goal is to discharge home.  Does have discharge plans.  No problems anticipated after discharge.

## 2014-12-22 NOTE — Patient Instructions (Addendum)
Thyroid: Take 1 pill alt with 2 in am

## 2014-12-22 NOTE — Plan of Care (Signed)
Problem: Diagnosis: Increased Risk For Suicide Attempt Goal: STG-Patient Will Report Suicidal Feelings to Staff Outcome: Progressing Pt denies SI, plan or intent

## 2014-12-22 NOTE — Discharge Summary (Signed)
Physician Discharge Summary Note  Patient:  Tina [name] is an 44 y.o., female MRN:  [id number] DOB:  1970-12-27 Patient phone:  [phone] (home)  Patient address:   [address]  [place] [address],   Date of Admission:  12/02/2014 Date of Discharge: 12-22-14  Reason for Admission: Mood stabilization  Discharge Diagnoses: Principal Problem:   Bipolar I disorder, current or most recent episode depressed, in partial remission (HCC) Active Problems:   Bipolar affective disorder, current episode severe (HCC)   Orthostatic hypotension   Bipolar disorder, current episode depressed, severe, without psychotic features (HCC)   Affective psychosis, bipolar (HCC)  Review of Systems  Constitutional: Negative.   HENT: Negative.   Eyes: Negative.   Respiratory: Negative.   Cardiovascular: Negative.   Gastrointestinal: Negative.   Genitourinary: Negative.   Musculoskeletal: Negative.   Skin: Negative.   Neurological: Negative.   Endo/Heme/Allergies: Negative.   Psychiatric/Behavioral: Positive for depression (Stabilized with medication prior to discharge). Negative for suicidal ideas, hallucinations, memory loss and substance abuse. The patient is nervous/anxious (Stabilized with medication prior to discharge ) and has insomnia ( Stabilized with medication prior to discharge).      Past Medical History  Diagnosis Date  . History of chicken pox   . Hypothyroidism   . Genital warts   . Depression   . Bipolar 2 disorder   . Adrenal insufficiency   . Acne   . Cellulitis   . Hx of Lyme disease   . Headache    Level of Care:  OP  Hospital Course:  Tina [name] is a 44 year old female who presented to the [facility] with encouragement from a friend who learned that patient had attempted suicide via overdose last week. The patient reported planning the attempt for at least one week. She reports she took an overdose of 30 prozac, 60 geodon, and 15 trazadone, and then threw  up. The patient was unable to contract for safety and inpatient admission was recommended. Patient reports she was dx with bipolar in her 91s and is primarily depressed, reporting her last manic episode was in 2014. She reports hopelessness, irritability, loss of pleasure, loss of motivation, decreased self care, missing work and suicide attempt. Patient appeared severely depressed with restricted affect notable during her psychiatric assessment today "I took a lot of medicines to end my life. But it did not work. I don't feel that anything will really help me. I have tried several medications before. I tried to kill myself in 2012 when I cut my wrist. I feel guilty about leaving my husband. I wanted to get back together but he has found someone else. I have three children who are with him right now.  After admission assessment, Tina [name]'s presenting symptoms were identified. She was started on medication regimen for those symptoms. She was enrolled & participated in the group counseling sessions being provided & held on this unit. She learned coping skills that should help her to cope better& maintain mood stability after discharge.  Tina [name]'s symptoms responded well to her treatment regimen. This is evidenced by her reports of improved symptoms & absence of suicidal ideations. She is currently being discharged to continue further psychiatric treatment as noted below. She is provided with all the necessary information needed to make these appointments without problems. Upon discharge, she adamantly denies any SIHI, AVH, delusional thoughts or paranoia. She left [facility] with all personal belongings in no apparent distress. Transportation per mother.  Consults:  psychiatry  Significant Diagnostic Studies:  labs: CBC with diff, CMP, UDS, Toxicology tests, TSH, U/A  Discharge Vitals:   Blood pressure 86/56, pulse 124, temperature 98.5 F (36.9 C), temperature source Oral, resp. rate 16, height 5" (0.127 m), weight  49.442 kg (109 lb), SpO2 97 %. Body mass index is 3,065.41 kg/(m^2). Lab Results:   No results found for this or any previous visit (from the past 72 hour(s)).  Physical Findings: AIMS: Facial and Oral Movements Muscles of Facial Expression: None, normal Lips and Perioral Area: None, normal Jaw: None, normal Tongue: None, normal,Extremity Movements Upper (arms, wrists, hands, fingers): None, normal Lower (legs, knees, ankles, toes): None, normal, Trunk Movements Neck, shoulders, hips: None, normal, Overall Severity Severity of abnormal movements (highest score from questions above): None, normal Incapacitation due to abnormal movements: None, normal Patient's awareness of abnormal movements (rate only patient's report): No Awareness, Dental Status Current problems with teeth and/or dentures?: No Does patient usually wear dentures?: No  CIWA:  CIWA-Ar Total: 1 COWS:  COWS Total Score: 2  Psychiatric Specialty Exam: See Psychiatric Specialty Exam and Suicide Risk Assessment completed by Attending Physician prior to discharge.  Discharge destination:  Home  Is patient on multiple antipsychotic therapies at discharge:  No   Has Patient had three or more failed trials of antipsychotic monotherapy by history:  No  Recommended Plan for Multiple Antipsychotic Therapies: NA     Medication List    STOP taking these medications        baclofen 20 MG tablet  Commonly known as:  LIORESAL     buPROPion 300 MG 24 hr tablet  Commonly known as:  WELLBUTRIN XL     cholestyramine 4 G packet  Commonly known as:  QUESTRAN     diphenhydrAMINE 50 MG capsule  Commonly known as:  BENADRYL     escitalopram 20 MG tablet  Commonly known as:  LEXAPRO     FLUoxetine 20 MG capsule  Commonly known as:  PROZAC     gabapentin 400 MG capsule  Commonly known as:  NEURONTIN     propranolol 10 MG tablet  Commonly known as:  INDERAL     spironolactone 100 MG tablet  Commonly known as:   ALDACTONE     traZODone 100 MG tablet  Commonly known as:  DESYREL     ziprasidone 80 MG capsule  Commonly known as:  GEODON      TAKE these medications      Indication   clindamycin 1 % external solution  Commonly known as:  CLEOCIN T  Apply topically 2 (two) times daily. Wound care   Indication:  Wound care     hydrOXYzine 25 MG tablet  Commonly known as:  ATARAX/VISTARIL  Take 1 tablet (25 mg total) by mouth at bedtime as needed for anxiety (anxiety/ insomnia.  May repeat x1).   Indication:  Anxiety/insomnia     lamoTRIgine 200 MG tablet  Commonly known as:  LAMICTAL  Take 1 tablet (200 mg total) by mouth at bedtime. For mood stabilization   Indication:  Mood stabilization     midodrine 5 MG tablet  Commonly known as:  PROAMATINE  Take 1 tablet (5 mg total) by mouth 2 (two) times daily with a meal. For high blood pressure   Indication:  HTN     nicotine polacrilex 2 MG gum  Commonly known as:  NICORETTE  Take 1 each (2 mg total) by mouth as needed for smoking cessation.  Indication:  Nicotine Addiction     thyroid 120 MG tablet  Commonly known as:  ARMOUR  Take 1 tablet (120 mg total) by mouth daily before breakfast. For hypothyroidism   Indication:  Underactive Thyroid     topiramate 25 MG tablet  Commonly known as:  TOPAMAX  Take 1 tablet (25 mg total) by mouth 2 (two) times daily. For mood stabilization   Indication:  Mood stabilization     venlafaxine XR 75 MG 24 hr capsule  Commonly known as:  EFFEXOR-XR  Take 3 capsules (225 mg total) by mouth daily with breakfast. For depression   Indication:  Major Depressive Disorder       Follow-up Information    Follow up with [facility] On 02/02/2015.   Why:  at 1:30pm with Lauris Poag for medication management.   Contact information:   [address], [address] [place], [place] [address] Phone: [phone] Fax: [phone]      Follow up with [facility].   Why:   Staff requests that you call to reschedule your therapy appointment with Verlan Friends when you finish the IOP Program   Contact information:   [address], [place], [place], [address] [phone]  Office [phone]  Fax      Follow up with [facility] On 12/23/2014.   Specialty:  Behavioral Health   Why:  at 8:45am for your first IOP appointment with [place] Sink.   Contact information:   [address] [place] [address] [phone]      Follow up with [facility] On 01/07/2015.   Specialty:  Internal Medicine   Why:  at 9:00am with Dr. Lawerance Bach for your new patient appointment for primary care   Contact information:   [address] [place] [address] [phone]     Follow-up recommendations:  Activity:  As tolerated Diet: As recommended by your primary care doctor. Keep all scheduled follow-up appointments as recommended.  Comments: Take all your medications as prescribed by your mental healthcare provider. Report any adverse effects and or reactions from your medicines to your outpatient provider promptly. Patient is instructed and cautioned to not engage in alcohol and or illegal drug use while on prescription medicines. In the event of worsening symptoms, patient is instructed to call the crisis hotline, 911 and or go to the nearest ED for appropriate evaluation and treatment of symptoms. Follow-up with your primary care provider for your other medical issues, concerns and or health care needs.   Total Discharge Time:  Greater than 30 minutes.  Signed: Sanjuana Kava, PMHNP, FNP-BC 12/22/2014, 1:56 PM  Patient seen, Suicide Assessment Completed.  Disposition Plan Reviewed

## 2014-12-22 NOTE — Progress Notes (Signed)
  [facility] Adult Case Management Discharge Plan :  Will you be returning to the same living situation after discharge:  No. Pt will be living temporarily with her mother At discharge, do you have transportation home?: Yes,  Pt's mother to provide transportation Do you have the ability to pay for your medications: Yes,  Pt provided with prescriptions  Release of information consent forms completed and in the chart;  Patient's signature needed at discharge.  Patient to Follow up at: Follow-up Information    Follow up with [facility] On 02/02/2015.   Why:  at 1:30pm with Lauris Poag for medication management.   Contact information:   [address] [place], [place] [address] Phone: [phone] Fax: [phone]      Follow up with [facility].   Why:  Staff requests that you call to reschedule your therapy appointment with Verlan Friends when you finish the IOP Program   Contact information:   [address] , [place], [address] [phone]  Office [phone]  Fax      Follow up with [facility] On 12/23/2014.   Specialty:  Behavioral Health   Why:  at 8:45am for your first IOP appointment with [place] Sink.   Contact information:   [address] [place] [address] [phone]      Patient denies SI/HI: Yes,  Pt denies    Safety Planning and Suicide Prevention discussed: Yes,  with mother; see SPE note for further details  Have you used any form of tobacco in the last 30 days? (Cigarettes, Smokeless Tobacco, Cigars, and/or Pipes): Yes  Has patient been referred to the Quitline?: Patient refused referral  Elaina Hoops 12/22/2014, 11:09 AM

## 2014-12-22 NOTE — Progress Notes (Signed)
Patient ID: Tina [name], female   DOB: 1970-04-22, 44 y.o.   MRN: [id number]            Reason for Appointment:  Evaluation of adrenal gland and Hypothyroidism, new visit    History of Present Illness:    PROBLEM 1:    Hypothyroidism was first diagnosed in 2012 At that time  She was having significant problems with fatigue and some hair loss. She has had chronic cold intolerance but this was not any worse.  Also does not think she noticed any change in her depression or her weight. Not clear what her initial labs were the time of diagnosis, no records are available from treating physician. However in 8/12 she had low levels of free T4 and free T3 with normal TSH, not clear if this was on treatment She was told that her blood test did not indicate Hashimoto's thyroiditis  She was tried on both Armour Thyroid and Synthroid but continued on Armour Thyroid She thinks that she was feeling depressed with taking Synthroid but records also indicated that she was having increased sweating with the Armour Thyroid She believes her symptoms of fatigue improved with taking thyroid supplements but still had some persistent hair loss  The patient does not know what doses of Armour Thyroid she had been taking previously but she thinks she is been taking the same dose for about 6 months She takes 60 mg of Armour Thyroid twice a day  Currently the patient does not feel unusually tired.  She does not feel any palpitations but periodically gets shaky which is not new. No sweating or heat intolerance. She says that she is at her normal weight now, previously had gained some weight  Recent thyroid level indicates persistently low TSH and normal free T4, no free T3 available  Wt Readings from Last 3 Encounters:  12/22/14 108 lb (48.988 kg)  12/08/14 109 lb (49.442 kg)  02/14/13 101 lb (45.813 kg)    Lab Results  Component Value Date   FREET4 0.87 12/03/2014   FREET4 0.98 08/26/2012   FREET4  0.71 12/23/2010   TSH 0.014* 12/02/2014   TSH <0.008* 02/15/2013   TSH 3.811 08/26/2012     PROBLEM 2:  Low blood pressure. She thinks that her blood pressure has been chronically low, not clear along, that pressure was not low in 2012.  She thinks her blood pressure usually in the 90s systolic  She said that if she is squatting and gets up she will feel lightheaded and sometimes getting out of bed she will get lightheaded also if she gets up quickly Otherwise does not feel lightheaded with normal activities.  She does not plan of any lack of appetite, nausea, unusual weight loss or diarrhea She did have episode of vomiting and nausea prior to her admission to the hospital in 9/16 She was reportedly dehydrated and had low sodium and potassium levels; otherwise has not had any history of low sodium  She reportedly had orthostatic hypotension in the hospital but the exact numbers are not available.  She had some readings in the 80s systolic She was started on hydrocortisone on 12/12/14 and cortisol level done the next morning was low normal at 6.7 Cortrosyn stim relation not done Hydrocortisone was stopped and was started by internist on midodrine She does not feel any different with this  Lab Results  Component Value Date   CREATININE 0.71 12/13/2014   BUN 15 12/13/2014   NA 138 12/13/2014  K 4.1 12/13/2014   CL 110 12/13/2014   CO2 23 12/13/2014      Past Medical History  Diagnosis Date  . History of chicken pox   . Hypothyroidism   . Genital warts   . Depression   . Bipolar 2 disorder   . Adrenal insufficiency   . Acne   . Cellulitis   . Hx of Lyme disease   . Headache     Past Surgical History  Procedure Laterality Date  . Tonsillectomy    . Cesarean section  2003, 2007, 2011  . Umbilical hernia repair    . Inguinal hernia repair Bilateral     Family History  Problem Relation Age of Onset  . Heart disease Other     Grandparent  . Alcohol abuse Paternal  Grandmother     Social History:  reports that she has been smoking Cigarettes.  She has a 43.5 pack-year smoking history. She does not have any smokeless tobacco history on file. She reports that she drinks about 7.2 - 10.8 oz of alcohol per week. She reports that she does not use illicit drugs.  Allergies:  Allergies  Allergen Reactions  . Stadol [Butorphanol] Other (See Comments)    Dependence  . Vancomycin Itching  . Penicillins Hives and Rash  . Sulfa Antibiotics Rash      Medication List       This list is accurate as of: 12/22/14  7:16 PM.  Always use your most recent med list.               clindamycin 1 % external solution  Commonly known as:  CLEOCIN T  Apply topically 2 (two) times daily. Wound care     hydrOXYzine 25 MG tablet  Commonly known as:  ATARAX/VISTARIL  Take 1 tablet (25 mg total) by mouth at bedtime as needed for anxiety (anxiety/ insomnia.  May repeat x1).     lamoTRIgine 200 MG tablet  Commonly known as:  LAMICTAL  Take 1 tablet (200 mg total) by mouth at bedtime. For mood stabilization     midodrine 5 MG tablet  Commonly known as:  PROAMATINE  Take 1 tablet (5 mg total) by mouth 2 (two) times daily with a meal. For high blood pressure     nicotine polacrilex 2 MG gum  Commonly known as:  NICORETTE  Take 1 each (2 mg total) by mouth as needed for smoking cessation.     thyroid 120 MG tablet  Commonly known as:  ARMOUR  Take 1 tablet (120 mg total) by mouth daily before breakfast. For hypothyroidism     topiramate 100 MG tablet  Commonly known as:  TOPAMAX     topiramate 25 MG tablet  Commonly known as:  TOPAMAX  Take 1 tablet (25 mg total) by mouth 2 (two) times daily. For mood stabilization     venlafaxine XR 75 MG 24 hr capsule  Commonly known as:  EFFEXOR-XR  Take 3 capsules (225 mg total) by mouth daily with breakfast. For depression        Review of Systems  Constitutional: Negative for weight loss and malaise/fatigue.        She thinks she may have lost weight in the last few weeks, unclear how much.  She thinks she is now at her normal weight  HENT:       Has had long history of migraines, treated with Topamax  Eyes: Negative for blurred vision.  Respiratory: Negative for shortness  of breath.   Cardiovascular: Negative for leg swelling.  Gastrointestinal: Negative for abdominal pain.  Genitourinary: Negative.   Musculoskeletal: Negative for joint pain.  Skin: Negative for rash.  Neurological: Positive for headaches.       Lightheadedness on standing up if getting up quickly  Endo/Heme/Allergies: Negative for polydipsia.  Psychiatric/Behavioral: Positive for depression. The patient has insomnia.        Has had periodic admissions for bipolar disorder.  Effexor started recently.  No changes made in previous medications made recently except reduction of Topamax   Appetite: Mildly decreased but this is unchanged over the last few years           Her menstrual cycles are irregular recently but this is mostly related to episode of bleeding before her cycle is due and only rarely has mildly delayed cycles.  Previous to her last pregnancy she had no irregularity of her menstrual cycles with difficulty conceiving    Examination:    BP 85/60 mmHg  Pulse 92  Temp(Src) 98.8 F (37.1 C) (Oral)  Resp 14  Ht 5' (1.524 m)  Wt 108 lb (48.988 kg)  BMI 21.09 kg/m2  SpO2 97%  LMP  (LMP Unknown)  GENERAL: Average built and nutrition  Has a flat affect.  Average build.   No pallor, clubbing, lymphadenopathy or edema.  Skin:  no rash or pigmentation.  EYES:  Stare present.  No prominence of the eyes or swelling of the eyelids.  No lid retraction or proptosis.   Fundi show normal discs, less well-seen on the left side  ENT: Oral mucosa and tongue normal.  No oral pigmentation present  THYROID:  Not palpable.  HEART:  Normal  S1 and S2; no murmur or click.  CHEST:    Lungs: Vescicular breath sounds heard  equally.  No crepitations/ wheeze.  ABDOMEN:  No distention.  Liver and spleen not palpable.  No other mass or tenderness.  NEUROLOGICAL: Speech is normal.  Occasionally has a blank stare and may not answer questions readily.  Reflexes are bilaterally slightly brisk at biceps.  Minimal fine tremor present.  JOINTS:  Normal.   Assessments  1. Hypothyroidism with no clear documentation of her baseline labs available Review of her current records dating back to 2012 indicates he may have had a normal TSH and low free T4 and free T3 levels indicating possibly secondary hypothyroidism however unclear whether she was already taking Armour Thyroid when these tests were done  Currently appears to be on relatively large dose of Armour Thyroid for her weight She does have difficulties with anxiety and insomnia and may feel shaky at times, not clear if symptoms are related to iatrogenic hyperthyroidism or anxiety disorder.  She has mild sinus tachycardia today.  Since her TSH is suppressed into the hyperthyroid range her dose of 120 mg Armour thyroid is excessive  2.  Low normal blood pressure..  She does not have any clear-cut symptoms of adrenal insufficiency and difficult to find documentation of orthostatic hypotension from current records.  Also has had only minimal symptoms of orthostatic hypotension prior to her recent admission. Her admission records indicate that she may have been dehydrated subsequent to episode of vomiting prior to admission  Has only mild decrease in blood pressure standing up today and no symptoms suggestive of adrenal insufficiency Explained to the patient that her low normal fasting cortisol may be related to her getting hydrocortisone the day before  PLAN:     Check free  T3 level to assess dosage adjustment with Armour Thyroid  For now she can start alternating 60 with 120 mg of Armour Thyroid intake both tablets in the morning; may consider trial of 90 mg once a  day  Cortrosyn stimulation test to rule out adrenal insufficiency  Since the history of orthostatic hypotension is not clear may consider stopping midodrine  If she does have any evidence of hypopituitarism will consider additional testing including IGF-I level; currently does not appear to have any history suggestive of hypogonadotropic hypogonadism  Request and review detailed records from primary physician from the time of initial diagnosis  Total visit time including review of extensive records from previous providers and hospital, history and exam, review of labs, discussion about diagnoses and evaluation/treatment plan = 60 minutes  Tina [name] 12/22/2014, 7:16 PM

## 2014-12-22 NOTE — Progress Notes (Signed)
Discharge Note:  Patient discharged home with family member.  Patient denied SI and HI.  Denied A/V hallucinations.  Denied pain.  Suicide prevention information given and discussed with patient who stated she understood and had no questions.  Patient stated she received all her belongings, drawings, misc items, toiletries, prescriptions, large bag, purse, wallet, blanket, keys, glasses, cigarettes, book, make up bag.  Patient stated she appreciated all assistance received from [facility] staff.

## 2014-12-23 LAB — BASIC METABOLIC PANEL
BUN: 15 mg/dL (ref 6–23)
CO2: 26 meq/L (ref 19–32)
Calcium: 9.6 mg/dL (ref 8.4–10.5)
Chloride: 103 mEq/L (ref 96–112)
Creatinine, Ser: 0.71 mg/dL (ref 0.40–1.20)
GFR: 95.76 mL/min (ref >60.00)
GLUCOSE: 97 mg/dL (ref 70–99)
Potassium: 4.2 mEq/L (ref 3.5–5.1)
Sodium: 140 mEq/L (ref 135–145)

## 2014-12-23 LAB — T3, FREE: T3 FREE: 4.2 pg/mL (ref 2.3–4.2)

## 2014-12-24 ENCOUNTER — Other Ambulatory Visit (HOSPITAL_COMMUNITY): Payer: BLUE CROSS/BLUE SHIELD | Attending: Psychiatry | Admitting: Psychiatry

## 2014-12-24 ENCOUNTER — Encounter (HOSPITAL_COMMUNITY): Payer: Self-pay | Admitting: Psychiatry

## 2014-12-24 DIAGNOSIS — F319 Bipolar disorder, unspecified: Secondary | ICD-10-CM | POA: Insufficient documentation

## 2014-12-24 DIAGNOSIS — R45851 Suicidal ideations: Secondary | ICD-10-CM | POA: Insufficient documentation

## 2014-12-24 DIAGNOSIS — F17210 Nicotine dependence, cigarettes, uncomplicated: Secondary | ICD-10-CM | POA: Diagnosis not present

## 2014-12-24 DIAGNOSIS — F419 Anxiety disorder, unspecified: Secondary | ICD-10-CM | POA: Insufficient documentation

## 2014-12-24 DIAGNOSIS — F3130 Bipolar disorder, current episode depressed, mild or moderate severity, unspecified: Secondary | ICD-10-CM

## 2014-12-24 LAB — THYROID PEROXIDASE ANTIBODY: Thyroperoxidase Ab SerPl-aCnc: <6 IU/mL (ref 0–34)
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
## 2014-12-24 NOTE — Progress Notes (Signed)
Psychiatric Initial Adult Assessment   Patient Identification: Tina [name] MRN:  [id number] Date of Evaluation:  12/24/2014 Referral Source:  Psychiatric inpatient [facility] Chief Complaint:   Visit Diagnosis: bipolar 1 depressed Diagnosis:   Patient Active Problem List   Diagnosis Date Noted  . Affective psychosis, bipolar (HCC) [F31.9]   . Orthostatic hypotension [I95.1] 12/12/2014  . Bipolar disorder, current episode depressed, severe, without psychotic features (HCC) [F31.4]   . Bipolar affective disorder, current episode severe (HCC) [F31.9] 12/02/2014  . Bipolar I disorder, current or most recent episode depressed, in partial remission (HCC) [F31.75] 07/11/2014  . Bipolar 1 disorder, mixed (HCC) [F31.60] 06/27/2014  . Disease of thyroid gland [E07.9] 06/27/2014  . Anxiety [F41.9] 06/27/2014  . 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 Bipolar 1 disorder, depressed, partial remission (HCC) [F31.75] 06/27/2014  . 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 Alcohol use disorder, severe, dependence (HCC) [F10.20] 06/24/2014  . Bipolar disorder, unspecified (HCC) [F31.9] 02/16/2013  . Adrenal insufficiency (HCC) [E27.40]   . Acne [L70.9]   . Cellulitis [L03.90]   . Hx of Lyme disease [Z86.19]   . Headache [R51]   . 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 Bipolar I disorder, most recent episode (or current) manic (HCC) [F31.10] 08/29/2012  . Hypothyroidism [E03.9] 12/23/2010  . Myalgia [M79.1] 12/23/2010  . Fatigue [R53.83] 12/23/2010  . Unspecified vitamin deficiency [E56.9] 12/23/2010  . Depression [F32.9]    History of Present Illness:  Ms Brannick has been depressed since her early teens.  Had one manic episode that lasted for months in 2014 which resulted in her separation from her husband.  Since that time they have become divorced and that has contributed to her depression as she regrets the  decision to leave him.  He is with someone else and has no desire to get back together, she says.  She has 3 children and has them half time.  She had to get a job and does not like the job at all,  Finances have become an issue.  She was drinking heavily after the separation and had to go to rehab.  She tends to bottle her emotions even with her support system of her mother and a good friend.  Her former husband had been her best support.  She has bee hospitalized 7 times for depression and 2 rehabs for alcohol and cannabis.  She continues feeling depressed and cannot make decisions.  Vegetates with indecision.  Crying daily, sadness, guilt feelings, no energy, motivation or interest.  Tired and irritable, no appetite, excessive sleep.Medications were changed in the hospitalization. Elements:  Location:  depression. Quality:  daily sadness. Severity:  passive suicidal thoughts but no intent or plan. Timing:  lifelong but aggravated by separation and divorce. Duration:  6 months this episode. Context:  as above. Associated Signs/Symptoms: Depression Symptoms:  depressed mood, anhedonia, insomnia, hypersomnia, fatigue, feelings of worthlessness/guilt, difficulty concentrating, hopelessness, impaired memory, recurrent thoughts of death, anxiety, weight loss, decreased appetite, (Hypo) Manic Symptoms:  Irritable Mood, Anxiety Symptoms:  none of significance Psychotic Symptoms:  none PTSD Symptoms: Negative  Past Medical History:  Past Medical History  Diagnosis Date  . History of chicken pox   . Hypothyroidism   . Genital warts   . Depression   . Bipolar 2 disorder   . Adrenal insufficiency   . Acne   . Cellulitis   . Hx of Lyme disease   . Headache  Past Surgical History  Procedure Laterality Date  . Tonsillectomy    . Cesarean section  2003, 2007, 2011  . Umbilical hernia repair    . Inguinal hernia repair Bilateral    Family History:  Family History  Problem  Relation Age of Onset  . Heart disease Other     Grandparent  . Alcohol abuse Paternal Grandmother    Social History:   Social History   Social History  . Marital Status: Married    Spouse Name: N/A  . Number of Children: N/A  . Years of Education: 16   Occupational History  . HOMEMAKER    Social History Main Topics  . Smoking status: Current Every Day Smoker -- 1.50 packs/day for 29 years    Types: Cigarettes  . Smokeless tobacco: Not on file  . Alcohol Use: 7.2 - 10.8 oz/week    2-8 Cans of beer, 10 Shots of liquor per week     Comment: social drinker-few drinks a week   . Drug Use: No  . Sexual Activity: Not Currently   Other Topics Concern  . Not on file   Social History Narrative   Regular exercise-yes   Additional Social History: none  Musculoskeletal: Strength & Muscle Tone: within normal limits Gait & Station: normal Patient leans: N/A  Psychiatric Specialty Exam: HPI  ROS  There were no vitals taken for this visit.There is no weight on file to calculate BMI.  General Appearance: Casual  Eye Contact:  Good  Speech:  Clear and Coherent  Volume:  Normal  Mood:  Depressed  Affect:  Depressed and Flat  Thought Process:  Coherent  Orientation:  Full (Time, Place, and Person)  Thought Content:  Negative  Suicidal Thoughts:  No  Homicidal Thoughts:  No  Memory:  Immediate;   Good Recent;   Good Remote;   Good  Judgement:  Intact  Insight:  Fair  Psychomotor Activity:  Normal  Concentration:  Fair  Recall:  Good  Fund of Knowledge:Good  Language: Good  Akathisia:  Negative  Handed:  Right  AIMS (if indicated):  0  Assets:  Communication Skills Desire for Improvement Financial Resources/Insurance Housing Social Support Talents/Skills Transportation Vocational/Educational  ADL's:  Intact  Cognition: WNL  Sleep:  poor   Is the patient at risk to self?  No. Has the patient been a risk to self in the past 6 months?  Yes.   Has the patient  been a risk to self within the distant past?  Yes.   Is the patient a risk to others?  No. Has the patient been a risk to others in the past 6 months?  No. Has the patient been a risk to others within the distant past?  No.  Allergies:   Allergies  Allergen Reactions  . Stadol [Butorphanol] Other (See Comments)    Dependence  . Vancomycin Itching  . Penicillins Hives and Rash  . Sulfa Antibiotics Rash   Current Medications: Current Outpatient Prescriptions  Medication Sig Dispense Refill  . clindamycin (CLEOCIN T) 1 % external solution Apply topically 2 (two) times daily. Wound care 30 mL 0  . hydrOXYzine (ATARAX/VISTARIL) 25 MG tablet Take 1 tablet (25 mg total) by mouth at bedtime as needed for anxiety (anxiety/ insomnia.  May repeat x1). 60 tablet 0  . lamoTRIgine (LAMICTAL) 200 MG tablet Take 1 tablet (200 mg total) by mouth at bedtime. For mood stabilization 30 tablet 0  . midodrine (PROAMATINE) 5 MG tablet Take 1  tablet (5 mg total) by mouth 2 (two) times daily with a meal. For high blood pressure 60 tablet 0  . nicotine polacrilex (NICORETTE) 2 MG gum Take 1 each (2 mg total) by mouth as needed for smoking cessation. (Patient not taking: Reported on 12/22/2014) 100 tablet 0  . thyroid (ARMOUR) 120 MG tablet Take 1 tablet (120 mg total) by mouth daily before breakfast. For hypothyroidism    . topiramate (TOPAMAX) 100 MG tablet   10  . topiramate (TOPAMAX) 25 MG tablet Take 1 tablet (25 mg total) by mouth 2 (two) times daily. For mood stabilization 60 tablet 0  . venlafaxine XR (EFFEXOR-XR) 75 MG 24 hr capsule Take 3 capsules (225 mg total) by mouth daily with breakfast. For depression 90 capsule 0   No current facility-administered medications for this visit.    Previous Psychotropic Medications: Yes   Substance Abuse History in the last 12 months:  Yes.    Consequences of Substance Abuse: Family Consequences:  separation from husband  Medical Decision Making:  Established  Problem, Worsening (2)  Treatment Plan Summary: daily group therpay    Carolanne Grumbling 10/12/201610:37 AM

## 2014-12-24 NOTE — Progress Notes (Signed)
12/24/14 Encounter: 9:30 AM - 11:30 AM  Purpose: Addressing replacement behaviors to unhealthy life practices. Intervention: Open guided discussion on ordering our environment to promote healthy recovery skills. Effect: [name] spent the session quietly listening to group discussion.  Donnamarie Rossettiodd Amelio Brosky CPSS

## 2014-12-25 ENCOUNTER — Telehealth: Payer: Self-pay | Admitting: Endocrinology

## 2014-12-25 ENCOUNTER — Other Ambulatory Visit (HOSPITAL_COMMUNITY): Payer: BLUE CROSS/BLUE SHIELD | Admitting: Psychiatry

## 2014-12-25 DIAGNOSIS — F3130 Bipolar disorder, current episode depressed, mild or moderate severity, unspecified: Secondary | ICD-10-CM

## 2014-12-25 DIAGNOSIS — F319 Bipolar disorder, unspecified: Secondary | ICD-10-CM | POA: Diagnosis not present

## 2014-12-25 NOTE — Telephone Encounter (Signed)
Patient stated that she is suppose have a cortisol shot and she want to know what it consist of.

## 2014-12-25 NOTE — Progress Notes (Signed)
    Daily Group Progress Note  Program: IOP  Group Time: 9:00-10:30  Participation Level: Active  Behavioral Response: Appropriate  Type of Therapy:  Group Therapy  Summary of Progress: Pt. Presents as primarily quiet, flat affect, anxious, appears to be challenged to connect with group members and engage in the group process. Pt. Reported that she was "feeling better".      Group Time: 10:30-12:00  Participation Level:  Active  Behavioral Response: Appropriate  Type of Therapy: Psycho-education Group  Summary of Progress: Pt. Participated in discussion about developing vulnerability as resistance to shame. Pt. Watched and discussed Dewain PenningBrene Brown video "the power of vulnerability".  Shaune PollackBrown, Jennifer B, LPC

## 2014-12-25 NOTE — Progress Notes (Signed)
12/25/14 Encounter: 9 AM - 12 PM  Purpose: To address the strength in transparency. Intervention: Open guided discussion on being open to natural emotions in recovery. Effect: [name] listened quietly to the support group session.  Donnamarie Rossettiodd Nataline Basara CPSS

## 2014-12-25 NOTE — Progress Notes (Signed)
    Daily Group Progress Note  Program: IOP  Group Time: 9:00-10:30  Participation Level: Active  Behavioral Response: Appropriate  Type of Therapy:  Group Therapy  Summary of Progress: Pt. Met with case manager and psychiatrist.      Group Time: 10:30-12:00  Participation Level:  Active  Behavioral Response: Appropriate  Type of Therapy: Psycho-education Group  Summary of Progress: Pt. Participated in discussion about use of meditation for stress management and emotion regulation. Pt. Discussed RAIN method of mindfulness meditation.   Nancie Neas, LPC

## 2014-12-26 ENCOUNTER — Other Ambulatory Visit (HOSPITAL_COMMUNITY): Payer: BLUE CROSS/BLUE SHIELD

## 2014-12-26 NOTE — Telephone Encounter (Signed)
Pt mom needs procedure code for the cortisol test ordered so she can check with insurance to see if it will cover.

## 2014-12-29 ENCOUNTER — Other Ambulatory Visit (HOSPITAL_COMMUNITY): Payer: BLUE CROSS/BLUE SHIELD | Admitting: Psychiatry

## 2014-12-30 ENCOUNTER — Other Ambulatory Visit (HOSPITAL_COMMUNITY): Payer: BLUE CROSS/BLUE SHIELD | Admitting: Psychiatry

## 2014-12-30 DIAGNOSIS — F319 Bipolar disorder, unspecified: Secondary | ICD-10-CM | POA: Diagnosis not present

## 2014-12-30 DIAGNOSIS — F3130 Bipolar disorder, current episode depressed, mild or moderate severity, unspecified: Secondary | ICD-10-CM

## 2014-12-30 NOTE — Progress Notes (Signed)
12/30/14 Encounter: 9:15 AM - 12:00 PM  Purpose: Finding internal balance in recovery. Intervention: Open guided discussion on possessing healthy, balanced elements in the recovery process. Effect: Tina [name] expressed how it is talking great personal effort to begin a new phase in her recovery.  Tina [name] CPSS

## 2014-12-31 ENCOUNTER — Other Ambulatory Visit (HOSPITAL_COMMUNITY): Payer: BLUE CROSS/BLUE SHIELD | Admitting: Psychiatry

## 2014-12-31 DIAGNOSIS — F319 Bipolar disorder, unspecified: Secondary | ICD-10-CM | POA: Diagnosis not present

## 2014-12-31 DIAGNOSIS — F3130 Bipolar disorder, current episode depressed, mild or moderate severity, unspecified: Secondary | ICD-10-CM

## 2014-12-31 NOTE — Progress Notes (Signed)
12/31/14 Encounter: 9:15 - 10:45 AM  Purpose: Performing general wellness check-in. Intervention: Open guided discussion on relevant issues in current recovery. Effect: [name] recounted the success of experiencing a "first good day" of recovery, as well as feeling overwhelmed by her occupational & familial roles.  Donnamarie Rossettiodd Dorotea Hand CPSS

## 2015-01-01 ENCOUNTER — Other Ambulatory Visit (HOSPITAL_COMMUNITY): Payer: BLUE CROSS/BLUE SHIELD | Admitting: Psychiatry

## 2015-01-02 ENCOUNTER — Other Ambulatory Visit (HOSPITAL_COMMUNITY): Payer: BLUE CROSS/BLUE SHIELD | Admitting: Psychiatry

## 2015-01-02 DIAGNOSIS — F319 Bipolar disorder, unspecified: Secondary | ICD-10-CM | POA: Diagnosis not present

## 2015-01-02 NOTE — Progress Notes (Signed)
    Daily Group Progress Note  Program: IOP  Group Time: 9:00-10:30  Participation Level: Active  Behavioral Response: Appropriate  Type of Therapy:  Group Therapy  Summary of Progress: Pt. Presented as talkative, engaged in the group process. Pt. Continues to grieve loss of her marriage and difficulty accepting the new normal of her life. Pt. Discussed ongoing problems in relationship with her sister who is not compassionate towards her mental illness.      Group Time: 10:30-12:00  Participation Level:  Active  Behavioral Response: Appropriate  Type of Therapy: Psycho-education Group  Summary of Progress: Pt. Participated in grief and loss group facilitated by Theda BelfastBob Hamilton.   BH-PIOPB PSYCH

## 2015-01-02 NOTE — Progress Notes (Signed)
    Daily Group Progress Note  Program: IOP  Group Time: 9:00-10:30  Participation Level: Active  Behavioral Response: Appropriate  Type of Therapy: Group Therapy  Summary of Progress: Pt. Presents with improved affect, talkative and engaged in the group process. Pt. Reported that she was "good" that she had been able to be productive by approaching household tasks and bills.    Group Time: 10:30-12:00  Participation Level:  Active  Behavioral Response: Appropriate  Type of Therapy: Psycho-education Group  Summary of Progress: Pt. Participated in yoga session facilitated by Forde RadonLeanne Yates, LPC for energy and increasing mood.    Shaune PollackBrown, Jennifer B, LPC

## 2015-01-02 NOTE — Progress Notes (Signed)
    Daily Group Progress Note  Program: IOP  Group Time: 9:00-10:30  Participation Level: Active  Behavioral Response: Appropriate  Type of Therapy:  Group Therapy  Summary of Progress: Pt. Presents as sad, flat affect, but alert and engaged in the group process. Pt. Discussed that she is actively grieving the loss of her marriage and anger towards herself for leaving her marriage during a manic episode. Pt. Expresses remorse regarding her actions and disbelief regarding the fact that her life is not changed and her husband has moved into a new home and is in a new relationship.     Group Time: 10:30-12:00  Participation Level:  Minimal  Behavioral Response: Appropriate  Type of Therapy: Psycho-education Group  Summary of Progress: Pt. Participated in discussion about developing healthy boundaries through assertive communication. Distinguished passive, assertive, and aggressive communication styles.     Shaune PollackBrown, Jennifer B, LPC

## 2015-01-05 ENCOUNTER — Other Ambulatory Visit (HOSPITAL_COMMUNITY): Payer: BLUE CROSS/BLUE SHIELD | Admitting: Psychiatry

## 2015-01-05 DIAGNOSIS — F319 Bipolar disorder, unspecified: Secondary | ICD-10-CM | POA: Diagnosis not present

## 2015-01-05 NOTE — Progress Notes (Signed)
    Daily Group Progress Note  Program: IOP  Group Time: 0900-1045  Participation Level: Active  Behavioral Response: Appropriate and Sharing  Type of Therapy:  Group Therapy  Summary of Progress:  Patient states her weekend was "ok;" states she was able to spend quality time with her kids; without her ex-husband.  According to pt, he "vacates" so they can have the home to themselves.  Pt also voiced that she would like to become a nurse; but is worried about the financial aspect and not being able to spend much time with the kids.  Peers suggested that pt go the community college route and possibly becoming a Education administratorcertified nursing assistant first to see if she really like the field.     Group Time: 1100-1200  Participation Level:  Active  Behavioral Response: Appropriate and Sharing  Type of Therapy: Psycho-education Group  Summary of Progress:   Peggye FothergillElena Payne, Pharm-D discussed and answered questions about various medications.  Chestine SporeLARK, RITA, M.Ed, CNA

## 2015-01-06 ENCOUNTER — Other Ambulatory Visit (HOSPITAL_COMMUNITY): Payer: BLUE CROSS/BLUE SHIELD | Admitting: Psychiatry

## 2015-01-06 DIAGNOSIS — F319 Bipolar disorder, unspecified: Secondary | ICD-10-CM | POA: Diagnosis not present

## 2015-01-06 DIAGNOSIS — F3130 Bipolar disorder, current episode depressed, mild or moderate severity, unspecified: Secondary | ICD-10-CM

## 2015-01-06 MED ORDER — AMPHETAMINE-DEXTROAMPHETAMINE 10 MG PO TABS: 10.0000 mg | ORAL_TABLET | Freq: Every day | ORAL | Status: DC

## 2015-01-06 NOTE — Progress Notes (Signed)
    Daily Group Progress Note  Program: IOP  Group Time: 0900-1045  Participation Level: Active  Behavioral Response: Appropriate and Sharing  Type of Therapy:  Group Therapy  Summary of Progress: Patient states she is improving each day.  Patient continues to complain that she doesn't feel any joy.  "I feel numb although I am keeping myself busy."  Pt is currently residing with her mother; but next week will resume her regular schedule with keeping her two kids.  "I am fearful that I won't be able to do it and fail."  Discussed negative thinking vs positive.  Explored various options with pt.     Group Time: 1100-1200  Participation Level:  Active  Behavioral Response: Appropriate  Type of Therapy: Psycho-education Group  Summary of Progress: Fernanda DrumDonna Shelton (Librarian, academicexecutive director at [facility]) spoke to the group about all the services they provide within the community.  She provided everyone with a brochure and explained the process of attending a group or enrolling into any of their services.    Chestine SporeLARK, RITA, M.Ed, CNA

## 2015-01-07 ENCOUNTER — Encounter: Payer: Self-pay | Admitting: Internal Medicine

## 2015-01-07 ENCOUNTER — Other Ambulatory Visit (HOSPITAL_COMMUNITY): Payer: BLUE CROSS/BLUE SHIELD | Admitting: Psychiatry

## 2015-01-07 DIAGNOSIS — Z0289 Encounter for other administrative examinations: Secondary | ICD-10-CM

## 2015-01-07 DIAGNOSIS — F319 Bipolar disorder, unspecified: Secondary | ICD-10-CM | POA: Diagnosis not present

## 2015-01-07 NOTE — Progress Notes (Signed)
This encounter was created in error - please disregard.

## 2015-01-08 ENCOUNTER — Other Ambulatory Visit (HOSPITAL_COMMUNITY): Payer: BLUE CROSS/BLUE SHIELD | Admitting: Psychiatry

## 2015-01-08 DIAGNOSIS — F319 Bipolar disorder, unspecified: Secondary | ICD-10-CM | POA: Diagnosis not present

## 2015-01-08 DIAGNOSIS — F3130 Bipolar disorder, current episode depressed, mild or moderate severity, unspecified: Secondary | ICD-10-CM

## 2015-01-08 NOTE — Progress Notes (Signed)
    Daily Group Progress Note  Program: IOP  Group Time: 9:00-10:30  Participation Level: Active  Behavioral Response: Appropriate  Type of Therapy:  Group Therapy  Summary of Progress: Pt. Presented as alert, smiled appropriately, talkative. Pt. Reported that mood and motivation to engage in daily activities had greatly improved with addition of adderall. Pt. Discussed patterns of negative self-talk and discussion about recognizing sensory cures and discussed positive association to the scent of chantilly perfume that she connected to loving memories of her grandmother.     Group Time: 10:30-12:00  Participation Level:  Active  Behavioral Response: Appropriate  Type of Therapy: Psycho-education Group  Summary of Progress:  Pt. Participated in discussion about responding to negative self-talk and understanding core feelings of inadequacy and poor self-worth.  Shaune PollackBrown, Jennifer B, LPC

## 2015-01-08 NOTE — Progress Notes (Signed)
01/08/15 Encounter: 9 AM - 12 PM   Purpose: Addressing Maslow's "Hierarchy of Needs". Intervention: Open guided discussion on trauma-related coping skills. Effect: Tina [name] expressed her need to become acclimated to her "new, healthy" identity in recovery.  Donnamarie Rossettiodd Naijah Lacek CPSS

## 2015-01-09 ENCOUNTER — Other Ambulatory Visit (HOSPITAL_COMMUNITY): Payer: BLUE CROSS/BLUE SHIELD | Admitting: Psychiatry

## 2015-01-09 DIAGNOSIS — F3130 Bipolar disorder, current episode depressed, mild or moderate severity, unspecified: Secondary | ICD-10-CM

## 2015-01-09 DIAGNOSIS — F319 Bipolar disorder, unspecified: Secondary | ICD-10-CM | POA: Diagnosis not present
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
## 2015-01-09 NOTE — Progress Notes (Signed)
    Daily Group Progress Note  Program: IOP  Group Time: 9:00-10:30  Participation Level: Active  Behavioral Response: Appropriate  Type of Therapy:  Group Therapy  Summary of Progress: Pt. Continues to present with bright affect, talkative, and engaged in the group process. Pt. Continues to process finding her new identity as a single person.      Group Time: 10:30-12:00  Participation Level:  Active  Behavioral Response: Appropriate  Type of Therapy: Psycho-education Group  Summary of Progress: Pt. Watched [employer]Whitney Thore video and participated in discussion about developing body acceptance, resisting shaming, finding peace with self.  Shaune PollackBrown, Jennifer B, LPC

## 2015-01-12 ENCOUNTER — Other Ambulatory Visit (HOSPITAL_COMMUNITY): Payer: BLUE CROSS/BLUE SHIELD | Admitting: Psychiatry

## 2015-01-12 DIAGNOSIS — F319 Bipolar disorder, unspecified: Secondary | ICD-10-CM | POA: Diagnosis not present

## 2015-01-12 DIAGNOSIS — F3130 Bipolar disorder, current episode depressed, mild or moderate severity, unspecified: Secondary | ICD-10-CM

## 2015-01-12 NOTE — Progress Notes (Signed)
    Daily Group Progress Note  Program: IOP  Group Time: 9:00-10:30  Participation Level: Active  Behavioral Response: Appropriate  Type of Therapy:  Psycho-education Group  Summary of Progress: Pt. Participated in medication education group with DicksonElena.     Group Time: 10:30-12:00  Participation Level:  Active  Behavioral Response: Appropriate  Type of Therapy: Psycho-education Group  Summary of Progress: Pt. Participated in discussion with speaker about defining and applying mindfulness. Pt. Presented with much improved affect, appropriately talkative, and tearful while describing relationship with her children, ex-husband and meeting her ex-husband's girlfriend.   BH-PIOPB PSYCH

## 2015-01-13 ENCOUNTER — Other Ambulatory Visit (HOSPITAL_COMMUNITY): Payer: BLUE CROSS/BLUE SHIELD | Attending: Psychiatry | Admitting: Psychiatry

## 2015-01-13 DIAGNOSIS — F3130 Bipolar disorder, current episode depressed, mild or moderate severity, unspecified: Secondary | ICD-10-CM

## 2015-01-13 DIAGNOSIS — F319 Bipolar disorder, unspecified: Secondary | ICD-10-CM | POA: Diagnosis not present

## 2015-01-13 NOTE — Progress Notes (Signed)
    Daily Group Progress Note  Program: IOP  Group Time: 9:00-10:30  Participation Level: Active  Behavioral Response: Appropriate  Type of Therapy:  Group Therapy  Summary of Progress: Pt. Continues to present as relaxed, talkative, engaged in the group process. Pt. Demonstrates her strength in the group process and has a strong desire to help other group members generate solutions to their problems.      Group Time: 10:30-12:00  Participation Level:  Active  Behavioral Response: Appropriate  Type of Therapy: Psycho-education Group  Summary of Progress: Pt. Participated in discussion about finding voice in relationships. Pt. Focused on finding herself in her new role as a single woman and mother.  BH-PIOPB PSYCH

## 2015-01-14 ENCOUNTER — Other Ambulatory Visit (HOSPITAL_COMMUNITY): Payer: BLUE CROSS/BLUE SHIELD | Admitting: Psychiatry

## 2015-01-14 DIAGNOSIS — F319 Bipolar disorder, unspecified: Secondary | ICD-10-CM | POA: Diagnosis not present

## 2015-01-14 DIAGNOSIS — F3130 Bipolar disorder, current episode depressed, mild or moderate severity, unspecified: Secondary | ICD-10-CM

## 2015-01-14 NOTE — Progress Notes (Signed)
    Daily Group Progress Note  Program: IOP  Group Time: 9:00-10:30  Participation Level: Active  Behavioral Response: Appropriate  Type of Therapy:  Group Therapy  Summary of Progress: Pt. Presented as talkative, engaged in group process, appropriately disclosing and open to feedback from the group. Pt. Shared challenge of parenting a teenager and benefit of good communication and co-parenting that she shares with her ex-husband.      Group Time: 10:30-12:00  Participation Level:  Active  Behavioral Response: Appropriate  Type of Therapy: Psycho-education Group  Summary of Progress: Pt. Participated in instruction about the grounding series and participated in use of 4-3-8 breathing and bumble bee breath.   Shaune PollackBrown, Jennifer B, LPC

## 2015-01-15 ENCOUNTER — Other Ambulatory Visit (HOSPITAL_COMMUNITY): Payer: BLUE CROSS/BLUE SHIELD | Admitting: Psychiatry

## 2015-01-15 DIAGNOSIS — F3130 Bipolar disorder, current episode depressed, mild or moderate severity, unspecified: Secondary | ICD-10-CM

## 2015-01-15 DIAGNOSIS — F319 Bipolar disorder, unspecified: Secondary | ICD-10-CM | POA: Diagnosis not present

## 2015-01-15 NOTE — Progress Notes (Signed)
    Daily Group Progress Note  Program: IOP  Group Time: 9:00-10:30  Participation Level: Active  Behavioral Response: Appropriate  Type of Therapy:  Group Therapy  Summary of Progress: Pt. Continues to present with brightened affect, talkative, engaged in the group process. Pt. Reported second night with her kids, she made dinner, helped with homework and baths. Pt. Reported normal stressors of caregiving for children but she was able to "go with the flow" and did not feel stressed.      Group Time: 10:30-12:00  Participation Level:  Active  Behavioral Response: Appropriate  Type of Therapy: Psycho-education Group  Summary of Progress: Pt. Watched and discussed Haze RushingKristen Neff video about development of self-compassion by applying mindfulness, words of kindness to self, and reminding self of common humanity i.e., imperfection.  Shaune PollackBrown, Jackline Castilla B, LPC

## 2015-01-16 ENCOUNTER — Other Ambulatory Visit (HOSPITAL_COMMUNITY): Payer: BLUE CROSS/BLUE SHIELD | Admitting: Psychiatry

## 2015-01-16 DIAGNOSIS — F3130 Bipolar disorder, current episode depressed, mild or moderate severity, unspecified: Secondary | ICD-10-CM

## 2015-01-16 DIAGNOSIS — F319 Bipolar disorder, unspecified: Secondary | ICD-10-CM | POA: Diagnosis not present

## 2015-01-19 ENCOUNTER — Other Ambulatory Visit (HOSPITAL_COMMUNITY): Payer: BLUE CROSS/BLUE SHIELD
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
## 2015-01-20 ENCOUNTER — Other Ambulatory Visit (HOSPITAL_COMMUNITY): Payer: BLUE CROSS/BLUE SHIELD | Admitting: Psychiatry

## 2015-01-20 DIAGNOSIS — F319 Bipolar disorder, unspecified: Secondary | ICD-10-CM | POA: Diagnosis not present

## 2015-01-20 DIAGNOSIS — F3130 Bipolar disorder, current episode depressed, mild or moderate severity, unspecified: Secondary | ICD-10-CM
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
## 2015-01-21 ENCOUNTER — Other Ambulatory Visit (HOSPITAL_COMMUNITY): Payer: BLUE CROSS/BLUE SHIELD | Admitting: Psychiatry

## 2015-01-21 DIAGNOSIS — F319 Bipolar disorder, unspecified: Secondary | ICD-10-CM | POA: Diagnosis not present

## 2015-01-21 DIAGNOSIS — F3130 Bipolar disorder, current episode depressed, mild or moderate severity, unspecified: Secondary | ICD-10-CM

## 2015-01-21 MED ORDER — VENLAFAXINE HCL ER 75 MG PO CP24: 225.0000 mg | ORAL_CAPSULE | Freq: Every day | ORAL | Status: DC

## 2015-01-21 MED ORDER — HYDROXYZINE HCL 25 MG PO TABS: 25.0000 mg | ORAL_TABLET | Freq: Every evening | ORAL | Status: DC | PRN

## 2015-01-21 MED ORDER — LAMOTRIGINE 200 MG PO TABS: 200.0000 mg | ORAL_TABLET | Freq: Every day | ORAL | Status: DC

## 2015-01-21 MED ORDER — AMPHETAMINE-DEXTROAMPHETAMINE 10 MG PO TABS: 10.0000 mg | ORAL_TABLET | Freq: Two times a day (BID) | ORAL | Status: DC

## 2015-01-21 NOTE — Patient Instructions (Signed)
Patient completed MH-IOP today.  Will follow up with Vear ClockLeslie O'Neil, NP on 01-26-15.  Pt [facility]Sarah [facility], [facility] for [facility].  Plans to attend CD-IOP Aftercare group every Wednesday.  Return to work on 01-28-15; without any restrictions.

## 2015-01-21 NOTE — Progress Notes (Signed)
Patient ID: Tina [name], female   DOB: [birth year], 44 y.o.   MRN: [id number] Discharge Note  Patient:  Tina LoftLaurie K [name] is an 44 y.o., female DOB:  [birth year]  Date of Admission:  12/24/2014  Date of Discharge:  01/21/2015  Reason for Admission:depression  IOP Course:  [name] said she feels much better, both depression and anxiety have decreased.  She attributes the change mostly to the Effexor but also to the group experience.  If it were not for hating her job she would be happy she believes.  She is looking for another job but has some hope that since she feels better she might be able to cope better with the job.   Mental Status at Discharge:depression and anxiety significantly decreased. No suicidal thoughts  Lab Results: No results found for this or any previous visit (from the past 48 hour(s)).   Current outpatient prescriptions:  .  amphetamine-dextroamphetamine (ADDERALL) 10 MG tablet, Take 1 tablet (10 mg total) by mouth 2 (two) times daily., Disp: 60 tablet, Rfl: 0 .  ARMOUR THYROID 90 MG tablet, , Disp: , Rfl: 4 .  clindamycin (CLEOCIN T) 1 % external solution, Apply topically 2 (two) times daily. Wound care, Disp: 30 mL, Rfl: 0 .  cyanocobalamin (,VITAMIN B-12,) 1000 MCG/ML injection, , Disp: , Rfl: 4 .  hydrOXYzine (ATARAX/VISTARIL) 25 MG tablet, Take 1 tablet (25 mg total) by mouth at bedtime as needed for anxiety (anxiety/ insomnia.  May repeat x1)., Disp: 60 tablet, Rfl: 0 .  lamoTRIgine (LAMICTAL) 200 MG tablet, Take 1 tablet (200 mg total) by mouth at bedtime. For mood stabilization, Disp: 30 tablet, Rfl: 0 .  liothyronine (CYTOMEL) 5 MCG tablet, , Disp: , Rfl: 4 .  midodrine (PROAMATINE) 5 MG tablet, Take 1 tablet (5 mg total) by mouth 2 (two) times daily with a meal. For high blood pressure, Disp: 60 tablet, Rfl: 0 .  nicotine polacrilex (NICORETTE) 2 MG gum, Take 1 each (2 mg total) by mouth as needed for smoking cessation., Disp: 100 tablet, Rfl: 0 .  progesterone  (PROMETRIUM) 100 MG capsule, , Disp: , Rfl: 4 .  topiramate (TOPAMAX) 100 MG tablet, , Disp: , Rfl: 10 .  topiramate (TOPAMAX) 25 MG tablet, Take 1 tablet (25 mg total) by mouth 2 (two) times daily. For mood stabilization, Disp: 60 tablet, Rfl: 0 .  venlafaxine XR (EFFEXOR-XR) 75 MG 24 hr capsule, Take 3 capsules (225 mg total) by mouth daily with breakfast. For depression, Disp: 90 capsule, Rfl: 0  Axis Diagnosis:  Bipolar 1 disorder, depressed, mild   Level of Care:  IOP  Discharge destination:  Other:  has appointments with therapist and psychiatrist  Is patient on multiple antipsychotic therapies at discharge:  No    Has Patient had three or more failed trials of antipsychotic monotherapy by history:  Negative  Patient phone:  [phone] (home)  Patient address:   [address]  [place] [phone],   Follow-up recommendations:  Activity:  continue current activity Diet:  continue current diet  Comments:  none  The patient received suicide prevention pamphlet:  Yes Belongings returned:  na  Carolanne GrumblingGerald Lihanna Biever 01/21/2015, 8:21 AM

## 2015-01-21 NOTE — Progress Notes (Signed)
Tina [name] is a 44 yo, divorced, employed, Caucasian female who [facility]. Pt was admitted from 12-02-14 until 12-22-14; treatment for worsening depressive symptoms with a suicide attempt. [facility] from a friend who learned that patient had attempted suicide via overdose a couple of weeks ago. She had reported planning the attempt for at least one week. She reported she took an overdose of # 30 prozac, # 60 geodon, and # 15 trazadone, and then threw up. Pt admits to continued SI, no plan or intent. Stated her deterrent are her kids. Discussed safety options with pt at length. Pt was able to contract for safety. Denies HI, PTSD symptoms, A/V hallucinations and paranoia. Patient reported she was dx with bipolar in her 6820s and is primarily depressed, reporting her last manic episode was in 2014; which lasted for ~ eight months. She reported hopelessness, irritability, loss of pleasure, loss of motivation, tearfulness, increased sleep, low energy, decreased appetite (has lost 20 lbs recently, decreased self care, isolation, difficulty making decisions, and ruminating thoughts. Pt appeared severely depressed with a flat. States she has been tried on several medications before. Pt has been admitted on a psychiatric unit seven times since age 44 due to her Bipolar Disorder. Has had two drug[facility] (1992 and [facility]) [facility]. Admits to four past suicide attempts. Has been seeing Vear ClockLeslie O'Neil, NP for several months and has an upcoming new pt appt with a therapist soon. Multiple stressors: 1) Unresolved grief/loss issues: Pt and husband divorced one yr ago (April 2015). Pt left husband of fifteen years marriage during a manic episode. "I feel guilty about leaving my husband. 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 States she  hates her job and had been missing a lot of days. 3) Single parenting: Although she and ex-husband share custody, and he pays child support; she said it is very difficult for her to be there for the kids emotionally and physically. 4) Strained finances. Pt will complete the group today.  States she continues to struggle with poor sleep and appetite, with lack of concentration.  Denies SI, HI or A/V hallucinations.  Pt states she has maintained her sobriety, but hasn't attended any AA meetings.  Reports that the Effexor and Adderall is helping.  States having the kids back at home is going well.  "The groups were helpful.  I was able to talk and share in the groups."  A:  D/C today.  F/U with Vear ClockLeslie O'Neil, NP on 01-26-15 and pt will call [facility]Sarah [facility], [facility] for [facility] appointment.  Pt plans to attend aftercare (CD-IOP) every Wednesday.  Encouraged support groups and attending AA meetings.  R:  Pt receptive to attending aftercare, but not AA meetings at this time.       Chestine SporeLARK, RITA, M.Ed, CNA

## 2015-01-21 NOTE — Addendum Note (Signed)
Addended by: Carolanne GrumblingAYLOR, Kymora Sciara D on: 01/21/2015 08:29 AM   Modules accepted: Orders

## 2015-01-22 ENCOUNTER — Other Ambulatory Visit (HOSPITAL_COMMUNITY): Payer: BLUE CROSS/BLUE SHIELD | Admitting: Psychiatry

## 2015-01-22 NOTE — Progress Notes (Signed)
    Daily Group Progress Note  Program: IOP  Group Time: 9:00-10:30  Participation Level: Active  Behavioral Response: Appropriate  Type of Therapy:  Group Therapy  Summary of Progress: Pt. Prepared for discharge. Pt. Reports that she is looking for a job, taking care of children, committing to self-care. Pt. Indicates that she has maintained and is committed to her sobriety.     Group Time: 10:30-12:00  Participation Level:  Active  Behavioral Response: Appropriate  Type of Therapy: Psycho-education Group  Summary of Progress: Pt. Watched and discussed Dewain PenningBrene Brown video about developing vulnerability for resilience to shame and encouragement of healthy relationships.   Shaune PollackBrown, Jennifer B, LPC

## 2015-01-23 ENCOUNTER — Other Ambulatory Visit (HOSPITAL_COMMUNITY): Payer: BLUE CROSS/BLUE SHIELD

## 2015-01-24 ENCOUNTER — Other Ambulatory Visit (HOSPITAL_COMMUNITY): Payer: BLUE CROSS/BLUE SHIELD

## 2015-01-27 ENCOUNTER — Other Ambulatory Visit (HOSPITAL_COMMUNITY): Payer: BLUE CROSS/BLUE SHIELD

## 2015-01-28 ENCOUNTER — Other Ambulatory Visit (HOSPITAL_COMMUNITY): Payer: BLUE CROSS/BLUE SHIELD

## 2015-01-29 ENCOUNTER — Other Ambulatory Visit (HOSPITAL_COMMUNITY): Payer: BLUE CROSS/BLUE SHIELD

## 2015-01-30 ENCOUNTER — Other Ambulatory Visit (HOSPITAL_COMMUNITY): Payer: BLUE CROSS/BLUE SHIELD

## 2015-01-31 ENCOUNTER — Other Ambulatory Visit (HOSPITAL_COMMUNITY): Payer: BLUE CROSS/BLUE SHIELD

## 2015-02-03 ENCOUNTER — Other Ambulatory Visit (HOSPITAL_COMMUNITY): Payer: BLUE CROSS/BLUE SHIELD

## 2015-02-20 ENCOUNTER — Encounter: Payer: Self-pay | Admitting: Internal Medicine

## 2015-02-20 ENCOUNTER — Ambulatory Visit (INDEPENDENT_AMBULATORY_CARE_PROVIDER_SITE_OTHER): Payer: BLUE CROSS/BLUE SHIELD | Admitting: Internal Medicine

## 2015-02-20 VITALS — BP 118/82 | HR 79 | Temp 98.3°F | Resp 16 | Ht 60.0 in | Wt 107.0 lb

## 2015-02-20 DIAGNOSIS — F3160 Bipolar disorder, current episode mixed, unspecified: Secondary | ICD-10-CM | POA: Diagnosis not present

## 2015-02-20 DIAGNOSIS — E039 Hypothyroidism, unspecified: Secondary | ICD-10-CM | POA: Diagnosis not present

## 2015-02-20 DIAGNOSIS — A6920 Lyme disease, unspecified: Secondary | ICD-10-CM

## 2015-02-20 DIAGNOSIS — G43909 Migraine, unspecified, not intractable, without status migrainosus: Secondary | ICD-10-CM | POA: Insufficient documentation

## 2015-02-20 DIAGNOSIS — G43109 Migraine with aura, not intractable, without status migrainosus: Secondary | ICD-10-CM | POA: Diagnosis not present

## 2015-02-20 DIAGNOSIS — L709 Acne, unspecified: Secondary | ICD-10-CM

## 2015-02-20 MED ORDER — CLINDAMYCIN PHOSPHATE 1 % EX SOLN: Freq: Two times a day (BID) | CUTANEOUS | Status: DC

## 2015-02-20 NOTE — Progress Notes (Signed)
Pre visit review using our clinic review tool, if applicable. No additional management support is needed unless otherwise documented below in the visit note. 

## 2015-02-20 NOTE — Progress Notes (Signed)
Subjective:    Patient ID: Tina [name], female    DOB: December 22, 1970, 44 y.o.   MRN: [id number]  HPI She is here to establish with a new pcp.   Hypothyroidism:  She is taking armour thyroid and cytomel.  She is following with an integrative doctors.  She has her labs checked regularly by them.  Lyme disease:  She has been treated by the integrative doctors  - took three months of antibiotics and did not feel better.  She then went to a doctor in  [place] and was treated for about a year and a half.  She does still get symptoms intermittently since then but they are much less severe since the treatment.  She gets tired and has muscle fatigue.  She takes natural supplements now.    Bipolar disease:  She is following with behavioral medicine.  They are managing her medications.  She has a history of substance abuse, but is no longer using.  Her psych medications were recently changed and she feels better than she has in a while.    Acne:  She has acne on her face and back from the effexor.  She is using a topical clindamycin and it works well.    Medications and allergies reviewed with patient and updated if appropriate.  Patient Active Problem List   Diagnosis Date Noted  . Migraine 02/20/2015  . Affective psychosis, bipolar (HCC)   . Orthostatic hypotension 12/12/2014  . Bipolar disorder, current episode depressed, severe, without psychotic features (HCC)   . Bipolar affective disorder, current episode severe (HCC) 12/02/2014  . Bipolar I disorder, current or most recent episode depressed, in partial remission (HCC) 07/11/2014  . Bipolar 1 disorder, mixed (HCC) 06/27/2014  . Disease of thyroid gland 06/27/2014  . Anxiety 06/27/2014  . Opiate misuse 06/27/2014  . Cannabis dependence without physiological dependence (HCC) 06/27/2014  . Bipolar 1 disorder, depressed, partial remission (HCC) 06/27/2014  . 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 Alcohol use disorder, severe,  dependence (HCC) 06/24/2014  . Bipolar disorder, unspecified (HCC) 02/16/2013  . Adrenal insufficiency (HCC)   . Acne   . Hx of Lyme disease   . 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 Bipolar I disorder, most recent episode (or current) manic (HCC) 08/29/2012  . Hypothyroidism 12/23/2010  . Myalgia 12/23/2010  . Fatigue 12/23/2010  . Unspecified vitamin deficiency 12/23/2010  . Depression     Current Outpatient Prescriptions on File Prior to Visit  Medication Sig Dispense Refill  . amphetamine-dextroamphetamine (ADDERALL) 10 MG tablet Take 1 tablet (10 mg total) by mouth 2 (two) times daily. 60 tablet 0  . ARMOUR THYROID 90 MG tablet   4  . clindamycin (CLEOCIN T) 1 % external solution Apply topically 2 (two) times daily. Wound care 30 mL 0  . cyanocobalamin (,VITAMIN B-12,) 1000 MCG/ML injection   4  . liothyronine (CYTOMEL) 5 MCG tablet   4  . midodrine (PROAMATINE) 5 MG tablet Take 1 tablet (5 mg total) by mouth 2 (two) times daily with a meal. For high blood pressure 60 tablet 0  . progesterone (PROMETRIUM) 100 MG capsule   4  . topiramate (TOPAMAX) 25 MG tablet Take 1 tablet (25 mg total) by mouth 2 (two) times daily. For mood stabilization 60 tablet 0  . venlafaxine XR (EFFEXOR-XR) 75 MG 24 hr capsule Take 3 capsules (225 mg total) by mouth daily with breakfast. For depression 90 capsule 0   No current facility-administered medications  on file prior to visit.    Past Medical History  Diagnosis Date  . History of chicken pox   . Hypothyroidism   . Genital warts   . Depression   . Bipolar 2 disorder (HCC)   . Adrenal insufficiency (HCC)   . Acne   . Cellulitis   . Hx of Lyme disease   . Headache     Past Surgical History  Procedure Laterality Date  . Tonsillectomy    . Cesarean section  2003, 2007, 2011  . Umbilical hernia repair    . Inguinal hernia repair Bilateral     Social History   Social History  . Marital Status: Single    Spouse Name: N/A  . Number of  Children: N/A  . Years of Education: 16   Occupational History  . HOMEMAKER    Social History Main Topics  . Smoking status: Current Every Day Smoker -- 1.50 packs/day for 29 years    Types: Cigarettes  . Smokeless tobacco: None  . Alcohol Use: No     Comment: social drinker-few drinks a week   . Drug Use: No  . Sexual Activity: Not Currently   Other Topics Concern  . None   Social History Narrative   Regular exercise-no    Review of Systems  Constitutional: Negative for fever, chills and fatigue.  Respiratory: Negative for cough, shortness of breath and wheezing.   Cardiovascular: Negative for chest pain, palpitations and leg swelling.  Gastrointestinal: Negative for nausea, abdominal pain, diarrhea, constipation and blood in stool.  Genitourinary: Negative for dysuria and hematuria.  Musculoskeletal: Negative for myalgias, back pain and arthralgias.  Skin: Negative for rash.  Neurological: Positive for headaches. Negative for dizziness, weakness, light-headedness and numbness.       Objective:   Filed Vitals:   02/20/15 1519  BP: 118/82  Pulse: 79  Temp: 98.3 F (36.8 C)  Resp: 16   Filed Weights   02/20/15 1519  Weight: 107 lb (48.535 kg)   Body mass index is 20.9 kg/(m^2).   Physical Exam Constitutional: She appears well-developed and well-nourished. No distress.  HENT:  Head: Normocephalic and atraumatic.  Right Ear: External ear normal.  Left Ear: External ear normal.  Mouth/Throat: Oropharynx is clear and moist.  Normal bilateral ear canals and tympanic membranes  Eyes: Conjunctivae and EOM are normal.  Neck: Neck supple. No tracheal deviation present. No thyromegaly present.  No carotid bruit  Cardiovascular: Normal rate, regular rhythm and normal heart sounds.   No murmur heard. Pulmonary/Chest: Effort normal and breath sounds normal. No respiratory distress. She has no wheezes. She has no rales.  Abdominal: Soft. She exhibits no distension.  There is no tenderness.  Musculoskeletal: She exhibits no edema.  Lymphadenopathy:    She has no cervical adenopathy.  Skin: Skin is warm and dry. She is not diaphoretic.  Psychiatric: She has a normal mood and affect. Her behavior is normal.       Assessment & Plan:   See Problem List.   Follow up as needed

## 2015-02-20 NOTE — Patient Instructions (Addendum)
  We have reviewed your prior records including labs and tests today.  All other Health Maintenance issues reviewed.   All recommended immunizations and age-appropriate screenings are up-to-date.  No immunizations administered today.   Medications reviewed and updated.   No changes recommended at this time.       

## 2015-02-21 ENCOUNTER — Encounter: Payer: Self-pay | Admitting: Internal Medicine

## 2015-02-21 NOTE — Assessment & Plan Note (Signed)
Following with behavioral  Medications managed by them

## 2015-02-21 NOTE — Assessment & Plan Note (Signed)
Has been treated for 1.5 years [place] Still has intermittent muscle fatigue, fatigue

## 2015-02-21 NOTE — Assessment & Plan Note (Signed)
Monitored and managed by her integrative doctor On armour thyroid and cytomel

## 2015-02-21 NOTE — Assessment & Plan Note (Signed)
Taking topamax

## 2015-02-21 NOTE — Assessment & Plan Note (Signed)
Secondary to effexor Using cleocin -- refill sent to pharmacy

## 2015-03-04 ENCOUNTER — Ambulatory Visit (INDEPENDENT_AMBULATORY_CARE_PROVIDER_SITE_OTHER): Payer: BLUE CROSS/BLUE SHIELD | Admitting: Psychiatry

## 2015-03-04 DIAGNOSIS — F331 Major depressive disorder, recurrent, moderate: Secondary | ICD-10-CM

## 2015-03-04 MED ORDER — LAMOTRIGINE 200 MG PO TABS: 200.0000 mg | ORAL_TABLET | Freq: Every day | ORAL | Status: DC

## 2015-03-04 MED ORDER — TOPIRAMATE 25 MG PO TABS
25.0000 mg | ORAL_TABLET | Freq: Two times a day (BID) | ORAL | Status: DC
Start: 2015-03-04 — End: 2015-05-06

## 2015-03-04 MED ORDER — AMPHETAMINE-DEXTROAMPHETAMINE 10 MG PO TABS: ORAL_TABLET | ORAL | Status: DC

## 2015-03-04 MED ORDER — AMPHETAMINE-DEXTROAMPHETAMINE 10 MG PO TABS: 10.0000 mg | ORAL_TABLET | Freq: Two times a day (BID) | ORAL | Status: DC

## 2015-03-04 MED ORDER — VENLAFAXINE HCL ER 75 MG PO CP24: 225.0000 mg | ORAL_CAPSULE | Freq: Every day | ORAL | Status: DC

## 2015-03-04 NOTE — Progress Notes (Signed)
Psychiatric Initial Adult Assessment   Patient Identification: Tina [name] MRN:  [id number] Date of Evaluation:  03/04/2015 Referral Source: IOP Chief Complaint:   feel great Visit Diagnosis: Major depression, recurrent, moderate Diagnosis:  Major depression, recurrent, moderate This patient is a 44 year old divorced mother of 3 who recently came out of the IOP after being psychiatrically hospitalized at [facility] for suicide attempt. She was hospitalized for 3 weeks and then in the IOP for approximately 3 weeks area in the IOP she was started on Adderall in addition to her Effexor. The patient felt better within days has felt very well since. Before being admitted to the hospital she would have persistent daily depression and disturbances in her sleep appetite and energy. The patient has a history of alcohol abuse. She was in the [facility] For alcohol. After being started on Adderall the patient went back to her primary care psychiatric caregiver Dr. Vear ClockLeslie O'Neil who refused to offer her the Adderall. The patient therefore is seen here today. Due to scheduling problems she was given only a 30 minute appointment. But it is 30 minutes was evident the patient is doing fairly well. She no longer drinks any alcohol by her history by her report. She's been sober since May 5. In February she was hospitalized for alcohol treatment. At this time the patient denies daily depression. She is sleeping and eating well. She's got good energy. She is concentrating without problems. She denies worthlessness. She no longer suicidal. She enjoys reading and music and enjoys her children. The patient denies any psychotic symptoms at this time. She denies ever having a period where she had mania when she wasn't using substances. She denies symptoms of generalized anxiety disorder panic disorder or obsessive-compulsive disorder. There is no history of attention deficit disorder in this patient. She is is  extensive psychiatric history is been psychiatrically hospitalized 6 times. Her medical illnesses include headache, hypothyroidism and Lyme disease. The patient takes her medicine regularly and has no complaints. She says she feels better now than she's felt in years.  Patient Active Problem List   Diagnosis Date Noted  . Migraine [G43.909] 02/20/2015  . Affective psychosis, bipolar (HCC) [F31.9]   . Bipolar disorder, current episode depressed, severe, without psychotic features (HCC) [F31.4]   . Bipolar affective disorder, current episode severe (HCC) [F31.9] 12/02/2014  . Bipolar I disorder, current or most recent episode depressed, in partial remission (HCC) [F31.75] 07/11/2014  . Bipolar 1 disorder, mixed (HCC) [F31.60] 06/27/2014  . Disease of thyroid gland [E07.9] 06/27/2014  . Anxiety [F41.9] 06/27/2014  . 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 Bipolar 1 disorder, depressed, partial remission (HCC) [F31.75] 06/27/2014  . 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 Alcohol use disorder, severe, dependence (HCC) [F10.20] 06/24/2014  . Bipolar disorder, unspecified (HCC) [F31.9] 02/16/2013  . Adrenal insufficiency (HCC) [E27.40]   . Acne [L70.9]   . Hx of Lyme disease [Z86.19]   . 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 Bipolar I disorder, most recent episode (or current) manic (HCC) [F31.10] 08/29/2012  . Hypothyroidism [E03.9] 12/23/2010  . Myalgia [M79.1] 12/23/2010  . Fatigue [R53.83] 12/23/2010  . Unspecified vitamin deficiency [E56.9] 12/23/2010  . Depression [F32.9]    History of Present Illness:  See above  Elements:   Associated Signs/Symptoms: Depression Symptoms:   (Hypo) Manic Symptoms:   Anxiety Symptoms:   Psychotic Symptoms:   PTSD Symptoms:   Past Medical  History:  Past Medical History  Diagnosis Date  . History of chicken pox   . Hypothyroidism   . Genital warts   .  Depression   . Bipolar 2 disorder (HCC)   . Adrenal insufficiency (HCC)   . Acne   . Cellulitis   . Hx of Lyme disease   . Headache     Past Surgical History  Procedure Laterality Date  . Tonsillectomy    . Cesarean section  2003, 2007, 2011  . Umbilical hernia repair    . Inguinal hernia repair Bilateral    Family History:  Family History  Problem Relation Age of Onset  . Heart disease Other     Grandparent  . Alcohol abuse Paternal Grandmother   . Mental retardation Paternal Grandmother   . Arthritis Maternal Grandfather   . Heart disease Maternal Grandfather   . Other Mother     PAD   Social History:   Social History   Social History  . Marital Status: Single    Spouse Name: N/A  . Number of Children: N/A  . Years of Education: 16   Occupational History  . HOMEMAKER    Social History Main Topics  . Smoking status: Current Every Day Smoker -- 1.50 packs/day for 29 years    Types: Cigarettes  . Smokeless tobacco: Not on file  . Alcohol Use: No     Comment: social drinker-few drinks a week   . Drug Use: No  . Sexual Activity: Not Currently   Other Topics Concern  . Not on file   Social History Narrative   Regular exercise-no   Additional Social History:   Musculoskeletal: Strength & Muscle Tone: within normal limits Gait & Station: normal Patient leans: Right  Psychiatric Specialty Exam: HPI  ROS  There were no vitals taken for this visit.There is no weight on file to calculate BMI.  General Appearance: Fairly Groomed  Eye Contact:  Good  Speech:  Clear and Coherent  Volume:  Normal  Mood:  Euthymic  Affect:  Appropriate  Thought Process:  Coherent  Orientation:  Full (Time, Place, and Person)  Thought Content:  WDL  Suicidal Thoughts:  No  Homicidal Thoughts:  No  Memory:  NA  Judgement:  Good  Insight:  Good  Psychomotor Activity:  Normal  Concentration:  Good  Recall:  Good  Fund of Knowledge:Good  Language: Good  Akathisia:  No   Handed:  Right  AIMS (if indicated):     Assets:  Desire for Improvement  ADL's:  Intact  Cognition: WNL  Sleep:     Is the patient at risk to self?  No. Has the patient been a risk to self in the past 6 months?  No. Has the patient been a risk to self within the distant past?  No. Is the patient a risk to others?  No. Has the patient been a risk to others in the past 6 months?  No. Has the patient been a risk to others within the distant past?  No.  Allergies:   Allergies  Allergen Reactions  . Stadol [Butorphanol] Other (See Comments)    Dependence  . Vancomycin Itching  . Penicillins Hives and Rash  . Sulfa Antibiotics Rash   Current Medications: Current Outpatient Prescriptions  Medication Sig Dispense Refill  . amphetamine-dextroamphetamine (ADDERALL) 10 MG tablet Refill after 1 /12/2015 60 tablet 0  . ARMOUR THYROID 90 MG tablet   4  . clindamycin (CLEOCIN T) 1 %  external solution Apply topically 2 (two) times daily. 30 mL 3  . cyanocobalamin (,VITAMIN B-12,) 1000 MCG/ML injection   4  . lamoTRIgine (LAMICTAL) 200 MG tablet Take 1 tablet (200 mg total) by mouth daily. 30 tablet 5  . liothyronine (CYTOMEL) 5 MCG tablet   4  . midodrine (PROAMATINE) 5 MG tablet Take 1 tablet (5 mg total) by mouth 2 (two) times daily with a meal. For high blood pressure 60 tablet 0  . progesterone (PROMETRIUM) 100 MG capsule   4  . topiramate (TOPAMAX) 25 MG tablet Take 1 tablet (25 mg total) by mouth 2 (two) times daily. For mood stabilization 60 tablet 1  . venlafaxine XR (EFFEXOR-XR) 75 MG 24 hr capsule Take 3 capsules (225 mg total) by mouth daily with breakfast. For depression 90 capsule 3   No current facility-administered medications for this visit.    Previous Psychotropic Medications: Yes   Substance Abuse History in the last 12 months:  Yes.    Consequences of Substance Abuse:   Medical Decision Making:  New problem, with additional work up planned  Treatment Plan  Summary: In this evaluation it was clear was abbreviated. Overall though the patient does seem to be stable. She's not suicidal and she's not using any drugs. She shows no evidence of psychosis. She's not suicidal. She's very pleased to be impairing to be receiving her medications. I did agree this time to continue her Adderall 10 mg twice a day and continue her Effexor 75 mg 3 a day. I also continued her Lamictal 200 mg and her Topamax 25 mg. The patient is resistant for psychotherapy. Patient was told that at any point we would do a drug screen and if indeed positive and her treatment with Adderall. It is possible the patient is clearly benefiting from Adderall and I think it's important because she might find to be important to stay clean and sober to be able to get this agent. She claims she feels much improved and she's been on it now for a number of months. 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 She has 3 children. This patient to return to see me in 7 weeks for a 40 minute reevaluation.    Raden Byington IRVING 12/21/20165:02 PM

## 2015-05-06 ENCOUNTER — Ambulatory Visit (INDEPENDENT_AMBULATORY_CARE_PROVIDER_SITE_OTHER): Payer: BLUE CROSS/BLUE SHIELD | Admitting: Psychiatry

## 2015-05-06 ENCOUNTER — Encounter (HOSPITAL_COMMUNITY): Payer: Self-pay | Admitting: Psychiatry

## 2015-05-06 VITALS — BP 131/76 | HR 95 | Ht 60.0 in | Wt 110.0 lb

## 2015-05-06 DIAGNOSIS — F3130 Bipolar disorder, current episode depressed, mild or moderate severity, unspecified: Secondary | ICD-10-CM | POA: Diagnosis not present

## 2015-05-06 DIAGNOSIS — F3162 Bipolar disorder, current episode mixed, moderate: Secondary | ICD-10-CM

## 2015-05-06 MED ORDER — LAMOTRIGINE 200 MG PO TABS: 200.0000 mg | ORAL_TABLET | Freq: Every day | ORAL | Status: DC

## 2015-05-06 MED ORDER — TOPIRAMATE 25 MG PO TABS
25.0000 mg | ORAL_TABLET | Freq: Two times a day (BID) | ORAL | Status: DC
Start: 2015-05-06 — End: 2016-06-26

## 2015-05-06 MED ORDER — VENLAFAXINE HCL ER 75 MG PO CP24: 225.0000 mg | ORAL_CAPSULE | Freq: Every day | ORAL | Status: DC

## 2015-05-06 MED ORDER — AMPHETAMINE-DEXTROAMPHETAMINE 10 MG PO TABS: ORAL_TABLET | ORAL | Status: DC

## 2015-05-06 NOTE — Progress Notes (Signed)
[facility] MD Progress Note  05/06/2015 4:58 PM Tina [name]  MRN:  [id number] Subjective: Feeling great Principal Problem: Bipolar disorder, most recent episode depressed Diagnosis:  Bipolar disorder, most recent episode depressed, substance abuse Today the patient is seen again after being seen 6 weeks ago. This is a patient who presently is taking Adderall and Effexor and claims she's doing very well. Recently the patient has brought a townhouse. She is moving into a bigger place for her 3 children. She is very proud of herself for the first time applying property. The patient is seen multiple psychiatrists is been hospitalized multiple times. Amazingly I saw her 20 years ago. The patient likes her job. She works in [employer]. She does very well. Today the patient denies any use of alcohol or substances other than what is prescribed. She is sleeping and eating well. She's got good energy. She enjoys time with her children. The patient demonstrates no evidence of psychosis. She is not euphoric or related. Her concentration seems to be steady. She takes Topamax for migraine headaches and claims she's running out. She takes a lot of Motrin for headaches on a daily basis. The patient has been divorced for 2 years. Presently she is in no relationships. Her kids are doing very well. The patient is healthy. Patient Active Problem List   Diagnosis Date Noted  . Migraine [G43.909] 02/20/2015  . Affective psychosis, bipolar (HCC) [F31.9]   . Bipolar disorder, current episode depressed, severe, without psychotic features (HCC) [F31.4]   . Bipolar affective disorder, current episode severe (HCC) [F31.9] 12/02/2014  . Bipolar I disorder, current or most recent episode depressed, in partial remission (HCC) [F31.75] 07/11/2014  . Bipolar 1 disorder, mixed (HCC) [F31.60] 06/27/2014  . Disease of thyroid gland [E07.9] 06/27/2014  . Anxiety [F41.9] 06/27/2014  . 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 Bipolar 1 disorder, depressed, partial remission (HCC) [F31.75] 06/27/2014  . 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 Alcohol use disorder, severe, dependence (HCC) [F10.20] 06/24/2014  . Bipolar disorder, unspecified (HCC) [F31.9] 02/16/2013  . Adrenal insufficiency (HCC) [E27.40]   . Acne [L70.9]   . Hx of Lyme disease [Z86.19]   . 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 Bipolar I disorder, most recent episode (or current) manic (HCC) [F31.10] 08/29/2012  . Hypothyroidism [E03.9] 12/23/2010  . Myalgia [M79.1] 12/23/2010  . Fatigue [R53.83] 12/23/2010  . Unspecified vitamin deficiency [E56.9] 12/23/2010  . Depression [F32.9]    Total Time spent with patient: 30 minutes  Past Psychiatric History:   Past Medical History:  Past Medical History  Diagnosis Date  . History of chicken pox   . Hypothyroidism   . Genital warts   . Depression   . Bipolar 2 disorder (HCC)   . Adrenal insufficiency (HCC)   . Acne   . Cellulitis   . Hx of Lyme disease   . Headache     Past Surgical History  Procedure Laterality Date  . Tonsillectomy    . Cesarean section  2003, 2007, 2011  . Umbilical hernia repair    . Inguinal hernia repair Bilateral    Family History:  Family History  Problem Relation Age of Onset  . Heart disease Other     Grandparent  . Alcohol abuse Paternal Grandmother   . Mental retardation Paternal Grandmother   . Arthritis Maternal Grandfather   . Heart disease Maternal Grandfather   . Other Mother  PAD   Family Psychiatric  History:  Social History:  History  Alcohol Use No    Comment: social drinker-few drinks a week      History  Drug Use No    Social History   Social History  . Marital Status: Single    Spouse Name: N/A  . Number of Children: N/A  . Years of Education: 16   Occupational History  . HOMEMAKER    Social History Main Topics  . Smoking  status: Current Every Day Smoker -- 1.50 packs/day for 29 years    Types: Cigarettes  . Smokeless tobacco: None  . Alcohol Use: No     Comment: social drinker-few drinks a week   . Drug Use: No  . Sexual Activity: Not Currently   Other Topics Concern  . None   Social History Narrative   Regular exercise-no   Additional Social History:                         Sleep: Good  Appetite:  Good  Current Medications: Current Outpatient Prescriptions  Medication Sig Dispense Refill  . amphetamine-dextroamphetamine (ADDERALL) 10 MG tablet 1  Bid  Fill after 06/27/2015 60 tablet 0  . ARMOUR THYROID 90 MG tablet   4  . clindamycin (CLEOCIN T) 1 % external solution Apply topically 2 (two) times daily. 30 mL 3  . cyanocobalamin (,VITAMIN B-12,) 1000 MCG/ML injection   4  . lamoTRIgine (LAMICTAL) 200 MG tablet Take 1 tablet (200 mg total) by mouth daily. 30 tablet 5  . liothyronine (CYTOMEL) 5 MCG tablet   4  . midodrine (PROAMATINE) 5 MG tablet Take 1 tablet (5 mg total) by mouth 2 (two) times daily with a meal. For high blood pressure 60 tablet 0  . progesterone (PROMETRIUM) 100 MG capsule   4  . topiramate (TOPAMAX) 25 MG tablet Take 1 tablet (25 mg total) by mouth 2 (two) times daily. For mood stabilization 60 tablet 2  . venlafaxine XR (EFFEXOR-XR) 75 MG 24 hr capsule Take 3 capsules (225 mg total) by mouth daily with breakfast. For depression 90 capsule 5   No current facility-administered medications for this visit.    Lab Results: No results found for this or any previous visit (from the past 48 hour(s)).  Blood Alcohol level:  Lab Results  Component Value Date   ETH <5 12/01/2014   ETH <11 02/14/2013    Physical Findings: AIMS:  , ,  ,  ,    CIWA:    COWS:     Musculoskeletal: Strength & Muscle Tone: within normal limits Gait & Station: normal Patient leans: N/A  Psychiatric Specialty Exam: ROS  Blood pressure 131/76, pulse 95, height 5' (1.524 m), weight  110 lb (49.896 kg).Body mass index is 21.48 kg/(m^2).  General Appearance: Casual  Eye Contact::  Good  Speech:  Clear and Coherent  Volume:  Normal  Mood:  Euthymic  Affect:  Appropriate  Thought Process:  Coherent  Orientation:  Full (Time, Place, and Person)  Thought Content:  WDL  Suicidal Thoughts:  No  Homicidal Thoughts:  No  Memory:  NA  Judgement:  Good  Insight:  Good  Psychomotor Activity:  Normal  Concentration:  Fair  Recall:  Fair  Fund of Knowledge:Fair  Language: Poor  Akathisia:  No  Handed:  Right  AIMS (if indicated):     Assets:  Desire for Improvement  ADL's:  Intact  Cognition:  WNL  Sleep:      Treatment Plan Summary: At this time the patient does seem to be stable. She is not in therapy she's not going to AA but she claims she's clean of all drugs and alcohol. The patient will get a urine drug screen in the next 24 hours. The patient will continue taking Adderall 10 mg twice a day and continue Effexor 225 mg. The patient also takes Lamictal 200 mg. She takes Topamax 25 mg which is actually reduced in the hospital. Today I shared with her that I would not prescribe Topamax for migraines and she agrees that she's been to see her primary care doctor for it. I will prescribe enough until she sees her medical doctor. Overall she seems to be stable. The patient seemed a little tense about getting a urine drug screen mainly around the issue of expense. I imply to her that in order to receive Adderall in the setting she needs to get this screen. We'll certainly check it when she returns back here in 3 months. The patient is not psychotic and not suicidal. Generally she is fairly stable. Her claim is that she's never felt so well. She says she is much more productive experiences no depression.  Lucas Mallow, MD 05/06/2015, 4:58 PM

## 2015-05-07 LAB — DRUG SCREEN, URINE
Amphetamine Screen, Ur: POSITIVE — AB
BARBITURATE QUANT UR: NEGATIVE
BENZODIAZEPINES.: NEGATIVE
Cocaine Metabolites: NEGATIVE
Creatinine,U: 47.29 mg/dL
METHADONE: NEGATIVE
Marijuana Metabolite: NEGATIVE
OPIATES: NEGATIVE
PROPOXYPHENE: NEGATIVE
Phencyclidine (PCP): NEGATIVE

## 2015-08-05 ENCOUNTER — Ambulatory Visit (INDEPENDENT_AMBULATORY_CARE_PROVIDER_SITE_OTHER): Payer: BLUE CROSS/BLUE SHIELD | Admitting: Psychiatry

## 2015-08-05 VITALS — BP 100/68 | HR 70 | Ht 60.0 in | Wt 118.8 lb

## 2015-08-05 DIAGNOSIS — F3170 Bipolar disorder, currently in remission, most recent episode unspecified: Secondary | ICD-10-CM | POA: Diagnosis not present

## 2015-08-05 MED ORDER — LAMOTRIGINE 200 MG PO TABS: 200.0000 mg | ORAL_TABLET | Freq: Every day | ORAL | Status: DC

## 2015-08-05 MED ORDER — AMPHETAMINE-DEXTROAMPHETAMINE 10 MG PO TABS: ORAL_TABLET | ORAL | Status: DC

## 2015-08-05 MED ORDER — VENLAFAXINE HCL ER 75 MG PO CP24: 225.0000 mg | ORAL_CAPSULE | Freq: Every day | ORAL | Status: DC

## 2015-08-05 MED ORDER — AMPHETAMINE-DEXTROAMPHETAMINE 10 MG PO TABS
ORAL_TABLET | ORAL | Status: DC
Start: 2015-08-05 — End: 2016-06-26

## 2015-08-05 NOTE — Progress Notes (Signed)
Patient ID: Tina [name], female   DOB: 1971/02/24, 45 y.o.   MRN: [id number] [facility] MD Progress Note  08/05/2015 4:29 PM Tina [name]  MRN:  [id number] Subjective: Feeling great Principal Problem: Bipolar disorder, most recent episode depressed Today the patient is doing fairly well. Her only issue has to do with her house that she recently but has a number of issues. It is also noted the patient is not in a relationship and cannot really describe many friends. Her workplace is good and she does have a supportive family. The patient says her mood is very good. She denies the use of alcohol or drugs. She takes no marijuana. This is a patient with a diagnosis of Lyme disease and sees a specialist for. She gets Topamax from her specialist. She'll continue getting Topamax from that specialist not for me. The patient is doing very well in terms of her mood. She denies daily depression. She is sleeping and eating quite well. It is noted the patient is been psychiatrically hospitalized 71 time in 2016 for suicidal ideation. A close evaluation the patient cannot really describe ever having a manic episode when she was not using substances. I not clear that she is bipolar disorder but rather has an agitated depression. The patient does not have a therapist and does not go to AA. She has 3 children the oldest being 36. He also seemed to be stable. Patient denies any chest pain or shortness of breath. She takes her medicines just as prescribed. Today we spent with and 50% of time educating her on the use of her medications and how she should take it just as prescribed which she seems to. The patient is not suicidal at this time. She is medically stable. Total Time spent with patient: 30 minutes  Past Psychiatric History:   Past Medical History:  Past Medical History  Diagnosis Date  . History of chicken pox   . Hypothyroidism   . Genital warts   . Depression   . Bipolar 2 disorder (HCC)   . Adrenal  insufficiency (HCC)   . Acne   . Cellulitis   . Hx of Lyme disease   . Headache     Past Surgical History  Procedure Laterality Date  . Tonsillectomy    . Cesarean section  2003, 2007, 2011  . Umbilical hernia repair    . Inguinal hernia repair Bilateral    Family History:  Family History  Problem Relation Age of Onset  . Heart disease Other     Grandparent  . Alcohol abuse Paternal Grandmother   . Mental retardation Paternal Grandmother   . Arthritis Maternal Grandfather   . Heart disease Maternal Grandfather   . Other Mother     PAD   Family Psychiatric  History:  Social History:  History  Alcohol Use No    Comment: social drinker-few drinks a week      History  Drug Use No    Social History   Social History  . Marital Status: Single    Spouse Name: N/A  . Number of Children: N/A  . Years of Education: 16   Occupational History  . HOMEMAKER    Social History Main Topics  . Smoking status: Current Every Day Smoker -- 1.50 packs/day for 29 years    Types: Cigarettes  . Smokeless tobacco: Not on file  . Alcohol Use: No     Comment: social drinker-few drinks a week   . Drug Use: No  .  Sexual Activity: Not Currently   Other Topics Concern  . Not on file   Social History Narrative   Regular exercise-no   Additional Social History:                         Sleep: Good  Appetite:  Good  Current Medications: Current Outpatient Prescriptions  Medication Sig Dispense Refill  . amphetamine-dextroamphetamine (ADDERALL) 10 MG tablet 1  Bid  Fill after 09/12/2015 60 tablet 0  . ARMOUR THYROID 90 MG tablet   4  . clindamycin (CLEOCIN T) 1 % external solution Apply topically 2 (two) times daily. 30 mL 3  . cyanocobalamin (,VITAMIN B-12,) 1000 MCG/ML injection   4  . lamoTRIgine (LAMICTAL) 200 MG tablet Take 1 tablet (200 mg total) by mouth daily. 30 tablet 6  . liothyronine (CYTOMEL) 5 MCG tablet   4  . midodrine (PROAMATINE) 5 MG tablet Take 1  tablet (5 mg total) by mouth 2 (two) times daily with a meal. For high blood pressure 60 tablet 0  . progesterone (PROMETRIUM) 100 MG capsule   4  . topiramate (TOPAMAX) 25 MG tablet Take 1 tablet (25 mg total) by mouth 2 (two) times daily. For mood stabilization 60 tablet 2  . venlafaxine XR (EFFEXOR-XR) 75 MG 24 hr capsule Take 3 capsules (225 mg total) by mouth daily with breakfast. For depression 90 capsule 7   No current facility-administered medications for this visit.    Lab Results: No results found for this or any previous visit (from the past 48 hour(s)).  Blood Alcohol level:  Lab Results  [facility]   [facility]   ETH <11 02/14/2013    Physical Findings: AIMS:  , ,  ,  ,    CIWA:    COWS:     Musculoskeletal: Strength & Muscle Tone: within normal limits Gait & Station: normal Patient leans: N/A  Psychiatric Specialty Exam: ROS  Blood pressure 100/68, pulse 70, height 5' (1.524 m), weight 118 lb 12.8 oz (53.887 kg).Body mass index is 23.2 kg/(m^2).  General Appearance: Casual  Eye Contact::  Good  Speech:  Clear and Coherent  Volume:  Normal  Mood:  Euthymic  Affect:  Appropriate  Thought Process:  Coherent  Orientation:  Full (Time, Place, and Person)  Thought Content:  WDL  Suicidal Thoughts:  No  Homicidal Thoughts:  No  Memory:  NA  Judgement:  Good  Insight:  Good  Psychomotor Activity:  Normal  Concentration:  Fair  Recall:  FiservFair  Fund of Knowledge:Fair  Language: Poor  Akathisia:  No  Handed:  Right  AIMS (if indicated):     Assets:  Desire for Improvement  ADL's:  Intact  Cognition: WNL  Sleep:      Treatment Plan Summary: At this time the patient will continue taking Adderall as prescribed 10 mg twice a day. She also continue on Effexor 225 mg, Lamictal 200 mg. At this time the patient is not in therapy. The patient seems very self sustaining. She claims actually her life is good. She denies a sense of being in struggle.  She says she medically feels well. She denies any neurological symptoms. This patient to return to see me in 5 months.  Tina MallowGerald Irving Chandrea Zellman, MD 08/05/2015, 4:29 PM

## 2015-12-15 ENCOUNTER — Other Ambulatory Visit: Payer: Self-pay | Admitting: Family Medicine

## 2015-12-15 ENCOUNTER — Ambulatory Visit
Admission: RE | Admit: 2015-12-15 | Discharge: 2015-12-15 | Disposition: A | Discharge status: Home / Self Care | Payer: BLUE CROSS/BLUE SHIELD | Source: Ambulatory Visit | Attending: Family Medicine | Admitting: Family Medicine

## 2015-12-15 DIAGNOSIS — R059 Cough, unspecified: Secondary | ICD-10-CM

## 2015-12-15 DIAGNOSIS — R05 Cough: Secondary | ICD-10-CM

## 2016-01-06 ENCOUNTER — Ambulatory Visit (HOSPITAL_COMMUNITY): Payer: Self-pay | Admitting: Psychiatry

## 2016-03-08 DIAGNOSIS — F3112 Bipolar disorder, current episode manic without psychotic features, moderate: Secondary | ICD-10-CM | POA: Insufficient documentation

## 2016-03-08 DIAGNOSIS — F5102 Adjustment insomnia: Secondary | ICD-10-CM | POA: Insufficient documentation

## 2016-03-25 ENCOUNTER — Encounter (HOSPITAL_COMMUNITY): Payer: Self-pay | Admitting: Emergency Medicine

## 2016-03-25 ENCOUNTER — Emergency Department (HOSPITAL_COMMUNITY)
Admission: EM | Admit: 2016-03-25 | Discharge: 2016-03-25 | Disposition: A | Discharge status: Home / Self Care | Payer: BLUE CROSS/BLUE SHIELD | Source: Home / Self Care

## 2016-03-25 ENCOUNTER — Emergency Department (HOSPITAL_COMMUNITY)
Admission: EM | Admit: 2016-03-25 | Discharge: 2016-03-25 | Disposition: A | Discharge status: Home / Self Care | Payer: BLUE CROSS/BLUE SHIELD | Attending: Emergency Medicine | Admitting: Emergency Medicine

## 2016-03-25 ENCOUNTER — Encounter (HOSPITAL_COMMUNITY): Payer: Self-pay

## 2016-03-25 ENCOUNTER — Emergency Department (HOSPITAL_COMMUNITY): Payer: BLUE CROSS/BLUE SHIELD

## 2016-03-25 DIAGNOSIS — F17210 Nicotine dependence, cigarettes, uncomplicated: Secondary | ICD-10-CM | POA: Diagnosis not present

## 2016-03-25 DIAGNOSIS — Z5321 Procedure and treatment not carried out due to patient leaving prior to being seen by health care provider: Secondary | ICD-10-CM

## 2016-03-25 DIAGNOSIS — F419 Anxiety disorder, unspecified: Secondary | ICD-10-CM | POA: Diagnosis not present

## 2016-03-25 DIAGNOSIS — E039 Hypothyroidism, unspecified: Secondary | ICD-10-CM | POA: Diagnosis not present

## 2016-03-25 DIAGNOSIS — R110 Nausea: Secondary | ICD-10-CM

## 2016-03-25 DIAGNOSIS — R0789 Other chest pain: Secondary | ICD-10-CM | POA: Diagnosis present

## 2016-03-25 DIAGNOSIS — Z79899 Other long term (current) drug therapy: Secondary | ICD-10-CM | POA: Insufficient documentation

## 2016-03-25 LAB — CBC
HCT: 35.5 % — ABNORMAL LOW (ref 36.0–46.0)
Hemoglobin: 12.6 g/dL (ref 12.0–15.0)
MCH: 33.3 pg (ref 26.0–34.0)
MCHC: 35.5 g/dL (ref 30.0–36.0)
MCV: 93.9 fL (ref 78.0–100.0)
PLATELETS: 346 10*3/uL (ref 150–400)
RBC: 3.78 MIL/uL — AB (ref 3.87–5.11)
RDW: 12.1 % (ref 11.5–15.5)
WBC: 8.4 10*3/uL (ref 4.0–10.5)

## 2016-03-25 LAB — BASIC METABOLIC PANEL
Anion gap: 8 (ref 5–15)
BUN: 7 mg/dL (ref 6–20)
CHLORIDE: 109 mmol/L (ref 101–111)
CO2: 23 mmol/L (ref 22–32)
CREATININE: 0.64 mg/dL (ref 0.44–1.00)
Calcium: 9.2 mg/dL (ref 8.9–10.3)
GFR calc Af Amer: >60 mL/min (ref >60)
GFR calc non Af Amer: >60 mL/min (ref >60)
Glucose, Bld: 99 mg/dL (ref 65–99)
Potassium: 3.7 mmol/L (ref 3.5–5.1)
Sodium: 140 mmol/L (ref 135–145)

## 2016-03-25 LAB — I-STAT TROPONIN, ED: Troponin i, poc: 0 ng/mL (ref 0.00–0.08)

## 2016-03-25 NOTE — ED Triage Notes (Signed)
Pt complaint of intermittent left/mid chest pain tightness onset 1230; pt appears anxious. [facility]

## 2016-03-25 NOTE — ED Notes (Signed)
Pt came up to this EMT and firmly requested the IV taken out that she was feeling better and was go a leave. IV removed.

## 2016-03-25 NOTE — ED Notes (Signed)
Pt called x2 for v/s recheck pt is not in the lobby

## 2016-03-25 NOTE — ED Triage Notes (Signed)
Pt called in all areas of lobby, not in lobby.

## 2016-03-25 NOTE — ED Notes (Signed)
Pt left on [employer]

## 2016-03-25 NOTE — ED Notes (Signed)
Pt back in ER waiting room.

## 2016-03-25 NOTE — ED Notes (Signed)
Pt left ER waiting area.

## 2016-03-25 NOTE — ED Notes (Signed)
Pt called for v/s recheck, no response from lobby 

## 2016-03-25 NOTE — ED Triage Notes (Signed)
Pt complaining of nausea and dizziness. Pt states has mold at home, was trying to clean. Pt states feelings resolved upon arrival to ED. Pt denies any n/v/d or pain.

## 2016-06-18 ENCOUNTER — Inpatient Hospital Stay (HOSPITAL_COMMUNITY)
Admission: AD | Admit: 2016-06-18 | Discharge: 2016-06-26 | DRG: 885 | Disposition: A | Discharge status: Home / Self Care | Payer: Medicaid Other | Source: Intra-hospital | Attending: Psychiatry | Admitting: Psychiatry

## 2016-06-18 ENCOUNTER — Encounter (HOSPITAL_COMMUNITY): Payer: Self-pay | Admitting: Emergency Medicine

## 2016-06-18 ENCOUNTER — Emergency Department (HOSPITAL_COMMUNITY)
Admission: EM | Admit: 2016-06-18 | Discharge: 2016-06-18 | Disposition: A | Discharge status: Other Acute Inpatient Hospital | Payer: Medicaid Other | Attending: Emergency Medicine | Admitting: Emergency Medicine

## 2016-06-18 ENCOUNTER — Encounter (HOSPITAL_COMMUNITY): Payer: Self-pay | Admitting: *Deleted

## 2016-06-18 DIAGNOSIS — I959 Hypotension, unspecified: Secondary | ICD-10-CM | POA: Diagnosis present

## 2016-06-18 DIAGNOSIS — Z811 Family history of alcohol abuse and dependence: Secondary | ICD-10-CM | POA: Diagnosis not present

## 2016-06-18 DIAGNOSIS — F329 Major depressive disorder, single episode, unspecified: Secondary | ICD-10-CM | POA: Diagnosis present

## 2016-06-18 DIAGNOSIS — Z810 Family history of intellectual disabilities: Secondary | ICD-10-CM | POA: Diagnosis not present

## 2016-06-18 DIAGNOSIS — F17210 Nicotine dependence, cigarettes, uncomplicated: Secondary | ICD-10-CM | POA: Diagnosis not present

## 2016-06-18 DIAGNOSIS — R45851 Suicidal ideations: Secondary | ICD-10-CM | POA: Diagnosis present

## 2016-06-18 DIAGNOSIS — Z79899 Other long term (current) drug therapy: Secondary | ICD-10-CM

## 2016-06-18 DIAGNOSIS — E039 Hypothyroidism, unspecified: Secondary | ICD-10-CM | POA: Diagnosis present

## 2016-06-18 DIAGNOSIS — F3130 Bipolar disorder, current episode depressed, mild or moderate severity, unspecified: Secondary | ICD-10-CM | POA: Diagnosis not present

## 2016-06-18 DIAGNOSIS — F3174 Bipolar disorder, in full remission, most recent episode manic: Secondary | ICD-10-CM | POA: Diagnosis present

## 2016-06-18 DIAGNOSIS — Z9114 Patient's other noncompliance with medication regimen: Secondary | ICD-10-CM

## 2016-06-18 DIAGNOSIS — F314 Bipolar disorder, current episode depressed, severe, without psychotic features: Principal | ICD-10-CM | POA: Diagnosis present

## 2016-06-18 DIAGNOSIS — R5382 Chronic fatigue, unspecified: Secondary | ICD-10-CM | POA: Diagnosis present

## 2016-06-18 DIAGNOSIS — Z7982 Long term (current) use of aspirin: Secondary | ICD-10-CM | POA: Diagnosis not present

## 2016-06-18 LAB — RAPID HIV SCREEN (HIV 1/2 AB+AG)
HIV 1/2 ANTIBODIES: NONREACTIVE
HIV-1 P24 Antigen - HIV24: NONREACTIVE

## 2016-06-18 LAB — COMPREHENSIVE METABOLIC PANEL
ALK PHOS: 46 U/L (ref 38–126)
ALT: 15 U/L (ref 14–54)
ANION GAP: 9 (ref 5–15)
AST: 23 U/L (ref 15–41)
Albumin: 3.8 g/dL (ref 3.5–5.0)
BUN: 9 mg/dL (ref 6–20)
CALCIUM: 9.5 mg/dL (ref 8.9–10.3)
CO2: 26 mmol/L (ref 22–32)
Chloride: 105 mmol/L (ref 101–111)
Creatinine, Ser: 0.65 mg/dL (ref 0.44–1.00)
Glucose, Bld: 118 mg/dL — ABNORMAL HIGH (ref 65–99)
Potassium: 4.2 mmol/L (ref 3.5–5.1)
SODIUM: 140 mmol/L (ref 135–145)
TOTAL PROTEIN: 6.6 g/dL (ref 6.5–8.1)
Total Bilirubin: 0.5 mg/dL (ref 0.3–1.2)

## 2016-06-18 LAB — CBC
HCT: 40.8 % (ref 36.0–46.0)
HEMOGLOBIN: 14.7 g/dL (ref 12.0–15.0)
MCH: 34.5 pg — AB (ref 26.0–34.0)
MCHC: 36 g/dL (ref 30.0–36.0)
MCV: 95.8 fL (ref 78.0–100.0)
PLATELETS: 258 10*3/uL (ref 150–400)
RBC: 4.26 MIL/uL (ref 3.87–5.11)
RDW: 12.6 % (ref 11.5–15.5)
WBC: 6.9 10*3/uL (ref 4.0–10.5)

## 2016-06-18 LAB — ETHANOL

## 2016-06-18 LAB — RAPID URINE DRUG SCREEN, HOSP PERFORMED
Amphetamines: NOT DETECTED
Barbiturates: NOT DETECTED
Benzodiazepines: POSITIVE — AB
COCAINE: NOT DETECTED
OPIATES: NOT DETECTED
Tetrahydrocannabinol: NOT DETECTED

## 2016-06-18 LAB — ACETAMINOPHEN LEVEL

## 2016-06-18 LAB — PREGNANCY, URINE: PREG TEST UR: NEGATIVE

## 2016-06-18 LAB — SALICYLATE LEVEL

## 2016-06-18 MED ORDER — NICOTINE 21 MG/24HR TD PT24
21.0000 mg | MEDICATED_PATCH | Freq: Every day | TRANSDERMAL | Status: DC
  Administered 2016-06-19 – 2016-06-26 (×8): 21 mg via TRANSDERMAL
  Filled 2016-06-18 (×12): qty 1

## 2016-06-18 MED ORDER — MAGNESIUM HYDROXIDE 400 MG/5ML PO SUSP: 30.0000 mL | Freq: Every day | ORAL | Status: DC | PRN

## 2016-06-18 MED ORDER — TRAZODONE HCL 100 MG PO TABS
100.0000 mg | ORAL_TABLET | Freq: Every evening | ORAL | Status: DC | PRN
  Administered 2016-06-18 – 2016-06-19 (×2): 100 mg via ORAL
  Filled 2016-06-18 (×2): qty 1

## 2016-06-18 MED ORDER — LAMOTRIGINE 200 MG PO TABS: 200.0000 mg | ORAL_TABLET | Freq: Every day | ORAL | Status: DC

## 2016-06-18 MED ORDER — ALUM & MAG HYDROXIDE-SIMETH 200-200-20 MG/5ML PO SUSP: 30.0000 mL | ORAL | Status: DC | PRN

## 2016-06-18 MED ORDER — VENLAFAXINE HCL ER 75 MG PO CP24: 225.0000 mg | ORAL_CAPSULE | Freq: Every day | ORAL | Status: DC

## 2016-06-18 MED ORDER — TOPIRAMATE 25 MG PO TABS: 25.0000 mg | ORAL_TABLET | Freq: Two times a day (BID) | ORAL | Status: DC

## 2016-06-18 MED ORDER — ACETAMINOPHEN 325 MG PO TABS: 650.0000 mg | ORAL_TABLET | Freq: Four times a day (QID) | ORAL | Status: DC | PRN

## 2016-06-18 MED ORDER — THYROID 60 MG PO TABS
90.0000 mg | ORAL_TABLET | Freq: Every day | ORAL | Status: DC
  Administered 2016-06-19 – 2016-06-26 (×7): 90 mg via ORAL
  Filled 2016-06-18 (×10): qty 1

## 2016-06-18 MED ORDER — HYDROXYZINE HCL 25 MG PO TABS
25.0000 mg | ORAL_TABLET | Freq: Four times a day (QID) | ORAL | Status: DC | PRN
  Administered 2016-06-19 – 2016-06-23 (×7): 25 mg via ORAL
  Filled 2016-06-18 (×7): qty 1

## 2016-06-18 NOTE — ED Notes (Signed)
Per lab, urine GC/chlamydia probe amp will be run on Monday.

## 2016-06-18 NOTE — Progress Notes (Signed)
Tina [name] is a 46 yo female admitted today to [facility] ( she says she was here last 2014) after stopping her meds and becoming manic ( same reason for today's admisiion) . At this time of admission ( she is again admitted for the same reason) she is tearful, distraught " I made such a fool of myself and they all think I'm awful" She reports her PMH consists of thyroid disease, Malachi Carl Ds, Lime Disease ( now resolved), recent walking pneumonia. She denies a history of abuse and /or mental illness in her family and also denies drug abuse as well. She is tearful, ashamed, but coo[perative and agrees to participate in the admission. After admission assessment complete, she is oriented to the unit and taken to her room.

## 2016-06-18 NOTE — ED Notes (Signed)
Pt also requested to be checked for gonorrhea/chlamydia and does not want her family to know. PA informed. Staffing called for a sitter.

## 2016-06-18 NOTE — Progress Notes (Signed)
Patient has been referred to the following inpatient treatment facilities: [facility], [facility], [facility], and [facility] (per [place], 1 adult female open).  CSW in disposition will continue to seek placement.  Melbourne Abts, LCSWA Disposition staff 06/18/2016 1:54 PM

## 2016-06-18 NOTE — ED Notes (Signed)
[facility] called, recommend inpatient.  Need urine pregnancy results and then will call back with room assignment.
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
## 2016-06-18 NOTE — ED Provider Notes (Signed)
[facility]-EMERGENCY DEPT Provider Note   CSN: [id number] Arrival date & time: 06/18/16  1035     History   Chief Complaint Chief Complaint  Patient presents with  . Suicidal    HPI Tina [name] is a 46 y.o. female.  HPI  Patient presents with concern of suicidal ideation. Patient acknowledges a history of bipolar disorder, for which she stopped taking her medicine about a month ago. Since that time the patient had a prolonged period of manic behavior, but now over the past weeks has had persistent despondency, depression, and now has suicidal ideation. Patient is here with multiple family members who assist with the history of present illness. Patient notes that over the past few days to weeks she has also had increasing uncontrollable behavior, including excessive spending, promiscuity, and been unable to focus on activities of daily living, or maintain her occupation. Patient denies any vaginal pain, complaints, abdominal pain, notes that she is on her menstrual cycle, but requests evaluation for STD. She denies a history of same.   Past Medical History:  Diagnosis Date  . Acne   . Adrenal insufficiency (HCC)   . Bipolar 2 disorder (HCC)   . Cellulitis   . Depression   . Genital warts   . Headache   . History of chicken pox   . Hx of Lyme disease   . Hypothyroidism     Patient Active Problem List   Diagnosis Date Noted  . Migraine 02/20/2015  . Affective psychosis, bipolar (HCC)   . Bipolar disorder, current episode depressed, severe, without psychotic features (HCC)   . Bipolar affective disorder, current episode severe (HCC) 12/02/2014  . Bipolar I disorder, current or most recent episode depressed, in partial remission (HCC) 07/11/2014  . Bipolar 1 disorder, mixed (HCC) 06/27/2014  . Disease of thyroid gland 06/27/2014  . Anxiety 06/27/2014  . Opiate misuse 06/27/2014  . Cannabis dependence without physiological dependence (HCC) 06/27/2014  . Bipolar 1  disorder, depressed, partial remission (HCC) 06/27/2014  . 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 Alcohol use disorder, severe, dependence (HCC) 06/24/2014  . Bipolar disorder, unspecified 02/16/2013  . Adrenal insufficiency (HCC)   . Acne   . Hx of Lyme disease   . 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 Bipolar I disorder, most recent episode (or current) manic (HCC) 08/29/2012  . Hypothyroidism 12/23/2010  . Myalgia 12/23/2010  . Fatigue 12/23/2010  . Unspecified vitamin deficiency 12/23/2010  . Depression     Past Surgical History:  Procedure Laterality Date  . CESAREAN SECTION  2003, 2007, 2011  . INGUINAL HERNIA REPAIR Bilateral   . TONSILLECTOMY    . UMBILICAL HERNIA REPAIR      OB History    No data available       Home Medications    Prior to Admission medications   Medication Sig Start Date End Date Taking? Authorizing Provider  amphetamine-dextroamphetamine (ADDERALL) 10 MG tablet 1  Bid  Fill after 09/12/2015 08/05/15   Archer Asa, MD  ARMOUR THYROID 90 MG tablet  12/29/14   Historical Provider, MD  clindamycin (CLEOCIN T) 1 % external solution Apply topically 2 (two) times daily. 02/20/15   Pincus Sanes, MD  cyanocobalamin (,VITAMIN B-12,) 1000 MCG/ML injection  12/29/14   Historical Provider, MD  lamoTRIgine (LAMICTAL) 200 MG tablet Take 1 tablet (200 mg total) by mouth daily. 08/05/15   Archer Asa, MD  liothyronine (CYTOMEL) 5 MCG tablet  12/29/14   Historical Provider, MD  midodrine (PROAMATINE)  5 MG tablet Take 1 tablet (5 mg total) by mouth 2 (two) times daily with a meal. For high blood pressure 12/22/14   Sanjuana Kava, NP  progesterone (PROMETRIUM) 100 MG capsule  12/29/14   Historical Provider, MD  topiramate (TOPAMAX) 25 MG tablet Take 1 tablet (25 mg total) by mouth 2 (two) times daily. For mood stabilization 05/06/15   Archer Asa, MD  venlafaxine XR (EFFEXOR-XR) 75 MG 24 hr capsule Take 3 capsules (225 mg total) by mouth daily with breakfast. For  depression 08/05/15   Archer Asa, MD    Family History Family History  Problem Relation Age of Onset  . Other Mother     PAD  . Heart disease Other     Grandparent  . Alcohol abuse Paternal Grandmother   . Mental retardation Paternal Grandmother   . Arthritis Maternal Grandfather   . Heart disease Maternal Grandfather     Social History Social History  Substance Use Topics  . Smoking status: Current Every Day Smoker    Packs/day: 1.50    Years: 29.00    Types: Cigarettes  . Smokeless tobacco: Never Used  . Alcohol use No     Comment: social drinker-few drinks a week      Allergies   Stadol [butorphanol]; Vancomycin; Penicillins; and Sulfa antibiotics   Review of Systems Review of Systems  Constitutional:       Per HPI, otherwise negative  HENT:       Per HPI, otherwise negative  Respiratory:       Per HPI, otherwise negative  Cardiovascular:       Per HPI, otherwise negative  Gastrointestinal: Negative for vomiting.  Endocrine:       Negative aside from HPI  Genitourinary:       Neg aside from HPI   Musculoskeletal:       Per HPI, otherwise negative  Skin: Negative.   Neurological: Negative for syncope.  Psychiatric/Behavioral: Positive for confusion, decreased concentration, dysphoric mood, sleep disturbance and suicidal ideas. The patient is nervous/anxious.      Physical Exam Updated Vital Signs BP 102/69   Pulse 71   Temp 98 F (36.7 C)   Resp 18   SpO2 100%   Physical Exam  Constitutional: She is oriented to person, place, and time. She appears well-developed and well-nourished. No distress.  HENT:  Head: Normocephalic and atraumatic.  Eyes: Conjunctivae and EOM are normal.  Cardiovascular: Normal rate and regular rhythm.   Pulmonary/Chest: Effort normal and breath sounds normal. No stridor. No respiratory distress.  Abdominal: She exhibits no distension. There is no tenderness. There is no rigidity and no guarding.  Musculoskeletal:  She exhibits no edema.  Neurological: She is alert and oriented to person, place, and time. No cranial nerve deficit.  Skin: Skin is warm and dry.  Psychiatric: Cognition and memory are not impaired. She exhibits a depressed mood. She expresses suicidal ideation. She expresses no suicidal plans.  Nursing note and vitals reviewed.    ED Treatments / Results  Labs (all labs ordered are listed, but only abnormal results are displayed) Labs Reviewed  COMPREHENSIVE METABOLIC PANEL - Abnormal; Notable for the following:       Result Value   Glucose, Bld 118 (*)    All other components within normal limits  ACETAMINOPHEN LEVEL - Abnormal; Notable for the following:    Acetaminophen (Tylenol), Serum <10 (*)    All other components within normal limits  CBC - Abnormal; Notable  for the following:    MCH 34.5 (*)    All other components within normal limits  ETHANOL  SALICYLATE LEVEL  RAPID URINE DRUG SCREEN, HOSP PERFORMED  RAPID HIV SCREEN (HIV 1/2 AB+AG)  RPR  GC/CHLAMYDIA PROBE AMP ([place]) NOT AT [facility]    After the initial evaluation I had the patient's family members step from the room. Patient then described her concern of possible STD, as well as her ongoing uncontrollable behavior. We discussed patient's request for STD testing, and given her ongoing menstrual cycle, she voiced a preference for urinary, and blood tests, with consideration of additional pelvic exam later.  Procedures Procedures (including critical care time)  Medications Ordered in ED Medications - No data to display   Initial Impression / Assessment and Plan / ED Course  I have reviewed the triage vital signs and the nursing notes.  Pertinent labs & imaging results that were available during my care of the patient were reviewed by me and considered in my medical decision making (see chart for details).  Patient with history of bipolar disorder, not currently taking medicine presents with concerns of  suicidal ideation. Patient is despondent, depressed, has ongoing suicidal ideation. However, she is awake and alert, hemodynamically stable, and although some labs are pending, no evidence for acute new pathology. Patient medically cleared for psychiatric evaluation.   Final Clinical Impressions(s) / ED Diagnoses  Suicidal ideation.    Gerhard Munch, MD 06/18/16 1159

## 2016-06-18 NOTE — ED Triage Notes (Signed)
Pt reports Bipolar disporder, she has been off her medications since August. Pt reports recent manic episode and reports now she is depressed with suicidal thoughts, thinking of multiple plans. Pt is accompanied by mother and stepmother.

## 2016-06-18 NOTE — Tx Team (Signed)
Initial Treatment Plan 06/18/2016 8:33 PM [name] [id number]    PATIENT STRESSORS: Financial difficulties Health problems Legal issue Marital or family conflict Medication change or noncompliance Traumatic event   PATIENT STRENGTHS: Ability for insight Active sense of humor Average or above average intelligence Capable of independent living Communication skills Financial means General fund of knowledge   PATIENT IDENTIFIED PROBLEMS: Bipolar Disorder  Depression   s/p recent pneumonia  s/p treatment for Lyme Disease  Epstein Barr Disorder  Thyroid Disease    " I'm so embarassed"  " I love my children"     DISCHARGE CRITERIA:  Ability to meet basic life and health needs Adequate post-discharge living arrangements Improved stabilization in mood, thinking, and/or behavior Medical problems require only outpatient monitoring Motivation to continue treatment in a less acute level of care Need for constant or close observation no longer present  PRELIMINARY DISCHARGE PLAN: Attend aftercare/continuing care group Attend PHP/IOP Outpatient therapy Participate in family therapy  PATIENT/FAMILY INVOLVEMENT: This treatment plan has been presented to and reviewed with the patient, Tina [name], and/or family member,.  The patient and family have been given the opportunity to ask questions and make suggestions.  Rich Brave, RN 06/18/2016, 8:33 PM

## 2016-06-19 ENCOUNTER — Encounter (HOSPITAL_COMMUNITY): Payer: Self-pay | Admitting: Registered Nurse

## 2016-06-19 DIAGNOSIS — Z811 Family history of alcohol abuse and dependence: Secondary | ICD-10-CM

## 2016-06-19 DIAGNOSIS — R45851 Suicidal ideations: Secondary | ICD-10-CM

## 2016-06-19 DIAGNOSIS — Z79899 Other long term (current) drug therapy: Secondary | ICD-10-CM

## 2016-06-19 DIAGNOSIS — Z7982 Long term (current) use of aspirin: Secondary | ICD-10-CM

## 2016-06-19 DIAGNOSIS — F3130 Bipolar disorder, current episode depressed, mild or moderate severity, unspecified: Secondary | ICD-10-CM

## 2016-06-19 LAB — TSH: TSH: <0.01 u[IU]/mL — ABNORMAL LOW (ref 0.350–4.500)

## 2016-06-19 LAB — RPR: RPR Ser Ql: NONREACTIVE

## 2016-06-19 MED ORDER — LAMOTRIGINE 25 MG PO TABS
25.0000 mg | ORAL_TABLET | Freq: Every day | ORAL | Status: DC
  Administered 2016-06-19 – 2016-06-26 (×8): 25 mg via ORAL
  Filled 2016-06-19 (×11): qty 1

## 2016-06-19 MED ORDER — THYROID 30 MG PO TABS
ORAL_TABLET | ORAL | Status: AC
  Filled 2016-06-19: qty 3

## 2016-06-19 MED ORDER — IBUPROFEN 600 MG PO TABS
600.0000 mg | ORAL_TABLET | Freq: Four times a day (QID) | ORAL | Status: DC | PRN
  Administered 2016-06-19 – 2016-06-20 (×2): 600 mg via ORAL
  Filled 2016-06-19 (×2): qty 1

## 2016-06-19 MED ORDER — VENLAFAXINE HCL ER 37.5 MG PO CP24
37.5000 mg | ORAL_CAPSULE | Freq: Every day | ORAL | Status: DC
  Filled 2016-06-19 (×2): qty 1

## 2016-06-19 MED ORDER — VENLAFAXINE HCL ER 37.5 MG PO CP24
37.5000 mg | ORAL_CAPSULE | Freq: Every day | ORAL | Status: DC
  Administered 2016-06-19 – 2016-06-21 (×3): 37.5 mg via ORAL
  Filled 2016-06-19 (×5): qty 1

## 2016-06-19 NOTE — Progress Notes (Signed)
D: Pt was in the dayroom talking to peer upon initial approach.  Pt presents with anxious affect and mood.  Pt reports she was "glad to see the doctor and get back on my medication."  Pt denies SI/HI, denies hallucinations, denies pain.  Pt has been visible in milieu interacting with peers and staff cautiously.   A: Actively listened to pt and offered support and encouragement.  PRN medication administered for sleep and anxiety.  Q15 minute safety checks maintained.  PO fluids encouraged and provided.  R: Pt is safe on the unit.  Pt verbally contracts for safety.  Will continue to monitor and assess.

## 2016-06-19 NOTE — [facility] Group Notes (Signed)
[facility] Group Notes:  Healthy Support Systems   Date:  06/19/2016  Time:  2:04 PM  Type of Therapy:  Psychoeducational Skills  Participation Level:  Did Not Attend  Participation Quality:  N/A  Affect:  N/A  Cognitive:  N/A  Insight:  None  Engagement in Group:  None  Modes of Intervention:  Discussion and Education  Summary of Progress/Problems: Patient was invited to group but did not attend. Ned Clines, Izaiah Tabb E 06/19/2016, 2:04 PM

## 2016-06-19 NOTE — H&P (Signed)
Psychiatric Admission Assessment Adult  Patient Identification: Tina [name] MRN:  [id number] Date of Evaluation:  06/19/2016 Chief Complaint:  Bipolar I disorder, Current or most recent episode depressed, Severe Principal Diagnosis: Bipolar I disorder, most recent episode depressed ([place]) Diagnosis:   Patient Active Problem List   Diagnosis Date Noted  . Bipolar I disorder, most recent episode depressed ([place]) [F31.30] 06/19/2016  . MDD (major depressive disorder) [F32.9] 06/18/2016  . Bipolar 1 disorder, manic, full remission ([place]) [F31.74] 06/18/2016  . Migraine [G43.909] 02/20/2015  . Affective psychosis, bipolar ([place]) [F31.9]   . Bipolar disorder, current episode depressed, severe, without psychotic features ([place]) [F31.4]   . Bipolar affective disorder, current episode severe ([place]) [F31.9] 12/02/2014  . Bipolar I disorder, current or most recent episode depressed, in partial remission ([place]) [F31.75] 07/11/2014  . Bipolar 1 disorder, mixed ([place]) [F31.60] 06/27/2014  . Disease of thyroid gland [E07.9] 06/27/2014  . Anxiety [F41.9] 06/27/2014  . 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 Cannabis dependence without physiological dependence ([place]) [F12.20] 06/27/2014  . Bipolar 1 disorder, depressed, partial remission ([place]) [F31.75] 06/27/2014  . 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 Alcohol use disorder, severe, dependence ([place]) [F10.20] 06/24/2014  . Bipolar disorder, unspecified [F31.9] 02/16/2013  . Adrenal insufficiency ([place]) [E27.40]   . Acne [L70.9]   . Hx of Lyme disease [Z86.19]   . 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 Bipolar I disorder, most recent episode (or current) manic ([place]) [F31.10] 08/29/2012  . Hypothyroidism [E03.9] 12/23/2010  . Myalgia [M79.1] 12/23/2010  . Fatigue [R53.83] 12/23/2010  . Unspecified vitamin deficiency [E56.9] 12/23/2010  . Depression [F32.9]    History of Present Illness: Patient was brought to hospital after telling her mother  she was having suicidal thoughts.  Patient reports that she has had an worsening of depression for at least a month.  Patient also states that she has been off of her medications since August 2017.  Patient denies hallucinations and paranoia but states that she does have disorganized thoughts.  Rates depression 10/10 and anxiety 10/10 (0/none and 10/10 worse.).  Patient continues to endorse suicidal thoughts stating that she is thinking of multiple ways she could do it but no specific plan.  Stressors for depression "I have a chemical imparlance. I mean there are other things but"  Patient would not elaborate any further about the reason/stressor for depression.  At this time patient denies homicidal ideation, paranoia, and psychosis.    Associated Signs/Symptoms: Depression Symptoms:  depressed mood, insomnia, fatigue, difficulty concentrating, impaired memory, recurrent thoughts of death, anxiety, loss of energy/fatigue, (Hypo) Manic Symptoms:  Distractibility, Impulsivity, Anxiety Symptoms:  Excessive Worry, Psychotic Symptoms:  Denies PTSD Symptoms: denies Total Time spent with patient: 1 hour  Past Psychiatric History: Reports that she has had multiple psychiatric hospitalization, suicide attempts last being 2016 when took an overdose.  Denies self injurious behavior.  Been on multiple psychotropics last were Lamictal 200 mg, Effexor XR 150 mg, Adderall 10 mg. Was seeing Dr. Judithann Graves outpatient but hasn't seen in a while and prior to discharge would like to be set up with someone else.    Is the patient at risk to self? Yes.    Has the patient been a risk to self in the past 6 months? Yes.    Has the patient been a risk to self within the distant past? Yes.    Is the patient a risk to others? No.  Has the patient been a risk to others in the  past 6 months? No.  Has the patient been a risk to others within the distant past? No.   Prior Inpatient Therapy:   Prior Outpatient Therapy:     Alcohol Screening: 1. How often do you have a drink containing alcohol?: Monthly or less 2. How many drinks containing alcohol do you have on a typical day when you are drinking?: 1 or 2 3. How often do you have six or more drinks on one occasion?: Never Preliminary Score: 0 9. Have you or someone else been injured as a result of your drinking?: No 10. Has a relative or friend or a doctor or another health worker been concerned about your drinking or suggested you cut down?: No Alcohol Use Disorder Identification Test Final Score (AUDIT): 1 Brief Intervention: AUDIT score less than 7 or less-screening does not suggest unhealthy drinking-brief intervention not indicated Substance Abuse History in the last 12 months:  Yes.   but denies at this time Consequences of Substance Abuse: Family Consequences:  family discord Previous Psychotropic Medications: Yes  Psychological Evaluations: Yes  Past Medical History:  Past Medical History:  Diagnosis Date  . Acne   . Adrenal insufficiency ([place])   . Bipolar 2 disorder ([place])   . Cellulitis   . Depression   . Genital warts   . Headache   . History of chicken pox   . Hx of Lyme disease   . Hypothyroidism     Past Surgical History:  Procedure Laterality Date  . CESAREAN SECTION  2003, 2007, 2011  . INGUINAL HERNIA REPAIR Bilateral   . TONSILLECTOMY    . UMBILICAL HERNIA REPAIR     Family History:  Family History  Problem Relation Age of Onset  . Other Mother     PAD  . Heart disease Other     Grandparent  . Alcohol abuse Paternal Grandmother   . Mental retardation Paternal Grandmother   . Arthritis Maternal Grandfather   . Heart disease Maternal Grandfather    Family Psychiatric  History: Unaware Tobacco Screening: Have you used any form of tobacco in the last 30 days? (Cigarettes, Smokeless Tobacco, Cigars, and/or Pipes): Yes Tobacco use, Select all that apply: 5 or more cigarettes per day Are you interested in Tobacco  Cessation Medications?: No, patient refused Counseled patient on smoking cessation including recognizing danger situations, developing coping skills and basic information about quitting provided: Refused/Declined practical counseling Social History:  History  Alcohol Use  . Yes    Comment: social drinker-few drinks a week      History  Drug Use No    Additional Social History:      Pain Medications: occ alcohol Prescriptions: none                    Allergies:   Allergies  Allergen Reactions  . Stadol [Butorphanol] Other (See Comments)    Dependence  . Vancomycin Itching  . Penicillins Hives and Rash  . Sulfa Antibiotics Rash   Lab Results:  Results for orders placed or performed during the hospital encounter of 06/18/16 (from the past 48 hour(s))  Comprehensive metabolic panel     Status: Abnormal   Collection Time: 06/18/16 10:59 AM  Result Value Ref Range   Sodium 140 135 - 145 mmol/L   Potassium 4.2 3.5 - 5.1 mmol/L   Chloride 105 101 - 111 mmol/L   CO2 26 22 - 32 mmol/L   Glucose, Bld 118 (H) 65 - 99 mg/dL  BUN 9 6 - 20 mg/dL   Creatinine, Ser 0.65 0.44 - 1.00 mg/dL   Calcium 9.5 8.9 - 10.3 mg/dL   Total Protein 6.6 6.5 - 8.1 g/dL   Albumin 3.8 3.5 - 5.0 g/dL   AST 23 15 - 41 U/L   ALT 15 14 - 54 U/L   Alkaline Phosphatase 46 38 - 126 U/L   Total Bilirubin 0.5 0.3 - 1.2 mg/dL   GFR calc non Af Amer >60 >60 mL/min   GFR calc Af Amer >60 >60 mL/min    Comment: (NOTE) The eGFR has been calculated using the CKD EPI equation. This calculation has not been validated in all clinical situations. eGFR's persistently <60 mL/min signify possible Chronic Kidney Disease.    Anion gap 9 5 - 15  Ethanol     Status: None   Collection Time: 06/18/16 10:59 AM  Result Value Ref Range   Alcohol, Ethyl (B) <5 <5 mg/dL    Comment:        LOWEST DETECTABLE LIMIT FOR SERUM ALCOHOL IS 5 mg/dL FOR MEDICAL PURPOSES ONLY   Salicylate level     Status: None    Collection Time: 06/18/16 10:59 AM  Result Value Ref Range   Salicylate Lvl <4.0 2.8 - 30.0 mg/dL  Acetaminophen level     Status: Abnormal   Collection Time: 06/18/16 10:59 AM  Result Value Ref Range   Acetaminophen (Tylenol), Serum <10 (L) 10 - 30 ug/mL    Comment:        THERAPEUTIC CONCENTRATIONS VARY SIGNIFICANTLY. A RANGE OF 10-30 ug/mL MAY BE AN EFFECTIVE CONCENTRATION FOR MANY PATIENTS. HOWEVER, SOME ARE BEST TREATED AT CONCENTRATIONS OUTSIDE THIS RANGE. ACETAMINOPHEN CONCENTRATIONS >150 ug/mL AT 4 HOURS AFTER INGESTION AND >50 ug/mL AT 12 HOURS AFTER INGESTION ARE OFTEN ASSOCIATED WITH TOXIC REACTIONS.   cbc     Status: Abnormal   Collection Time: 06/18/16 10:59 AM  Result Value Ref Range   WBC 6.9 4.0 - 10.5 K/uL   RBC 4.26 3.87 - 5.11 MIL/uL   Hemoglobin 14.7 12.0 - 15.0 g/dL   HCT 40.8 36.0 - 46.0 %   MCV 95.8 78.0 - 100.0 fL   MCH 34.5 (H) 26.0 - 34.0 pg   MCHC 36.0 30.0 - 36.0 g/dL   RDW 12.6 11.5 - 15.5 %   Platelets 258 150 - 400 K/uL  Rapid HIV screen (HIV 1/2 Ab+Ag)     Status: None   Collection Time: 06/18/16 12:02 PM  Result Value Ref Range   HIV-1 P24 Antigen - HIV24 NON REACTIVE NON REACTIVE   HIV 1/2 Antibodies NON REACTIVE NON REACTIVE   Interpretation (HIV Ag Ab)      A non reactive test result means that HIV 1 or HIV 2 antibodies and HIV 1 p24 antigen were not detected in the specimen.  RPR     Status: None   Collection Time: 06/18/16 12:02 PM  Result Value Ref Range   RPR Ser Ql Non Reactive Non Reactive    Comment: (NOTE) Performed At: [facility] [place], [place] [id number] Lindon Romp MD [id number]   Rapid urine drug screen (hospital performed)     Status: Abnormal   Collection Time: 06/18/16 12:06 PM  Result Value Ref Range   Opiates NONE DETECTED NONE DETECTED   Cocaine NONE DETECTED NONE DETECTED   Benzodiazepines POSITIVE (A) NONE DETECTED   Amphetamines NONE DETECTED NONE DETECTED    Tetrahydrocannabinol NONE DETECTED NONE DETECTED  Barbiturates NONE DETECTED NONE DETECTED    Comment:        DRUG SCREEN FOR MEDICAL PURPOSES ONLY.  IF CONFIRMATION IS NEEDED FOR ANY PURPOSE, NOTIFY LAB WITHIN 5 DAYS.        LOWEST DETECTABLE LIMITS FOR URINE DRUG SCREEN Drug Class       Cutoff (ng/mL) Amphetamine      1000 Barbiturate      200 Benzodiazepine   053 Tricyclics       976 Opiates          300 Cocaine          300 THC              50   Pregnancy, urine     Status: None   Collection Time: 06/18/16 12:23 PM  Result Value Ref Range   Preg Test, Ur NEGATIVE NEGATIVE    Comment:        THE SENSITIVITY OF THIS METHODOLOGY IS >20 mIU/mL.     Blood Alcohol level:  Lab Results  Component Value Date   ETH <5 06/18/2016   ETH <5 73/41/9379    Metabolic Disorder Labs:  Lab Results  Component Value Date   HGBA1C 5.1 12/03/2014   MPG 100 12/03/2014   MPG 100 12/02/2014   No results found for: PROLACTIN Lab Results  Component Value Date   CHOL 195 12/03/2014   TRIG 122 12/03/2014   HDL 42 12/03/2014   CHOLHDL 4.6 12/03/2014   VLDL 24 12/03/2014   LDLCALC 129 (H) 12/03/2014    Current Medications: Current Facility-Administered Medications  Medication Dose Route Frequency Provider Last Rate Last Dose  . hydrOXYzine (ATARAX/VISTARIL) tablet 25 mg  25 mg Oral Q6H PRN Myer Peer Cobos, MD      . lamoTRIgine (LAMICTAL) tablet 25 mg  25 mg Oral Daily Shuvon B Rankin, NP      . nicotine (NICODERM CQ - dosed in mg/24 hours) patch 21 mg  21 mg Transdermal Daily Myer Peer Cobos, MD      . thyroid (ARMOUR) tablet 90 mg  90 mg Oral QAC breakfast Jenne Campus, MD   90 mg at 06/19/16 0240  . traZODone (DESYREL) tablet 100 mg  100 mg Oral QHS PRN Jenne Campus, MD   100 mg at 06/18/16 2111  . [START ON 06/20/2016] venlafaxine XR (EFFEXOR-XR) 24 hr capsule 37.5 mg  37.5 mg Oral Q breakfast Shuvon B Rankin, NP       PTA Medications: Prescriptions Prior to  Admission  Medication Sig Dispense Refill Last Dose  . ARMOUR THYROID 90 MG tablet   4 06/17/2016 at Unknown time  . amphetamine-dextroamphetamine (ADDERALL) 10 MG tablet 1  Bid  Fill after 09/12/2015 60 tablet 0 Unknown at Unknown time  . Ascorbic Acid (VITAMIN C) 100 MG tablet Take 100 mg by mouth daily.   Unknown at Unknown time  . aspirin-acetaminophen-caffeine (EXCEDRIN MIGRAINE) 250-250-65 MG tablet Take 1 tablet by mouth every 6 (six) hours as needed for headache.   Unknown at Unknown time  . B Complex Vitamins (VITAMIN B COMPLEX 100) INJ Inject 1,000 mg as directed See admin instructions. Every 2 weeks   Unknown at Unknown time  . cholecalciferol (VITAMIN D) 1000 units tablet Take 1,000 Units by mouth daily.   Unknown at Unknown time  . clindamycin (CLEOCIN T) 1 % external solution Apply topically 2 (two) times daily. (Patient not taking: Reported on 06/18/2016) 30 mL 3 Unknown at Unknown time  .  cyanocobalamin (,VITAMIN B-12,) 1000 MCG/ML injection   4 Unknown at Unknown time  . Hydroxocobalamin 1000 MCG/ML SOLN Inject 1 mL into the skin See admin instructions.  1 Unknown at Unknown time  . lamoTRIgine (LAMICTAL) 200 MG tablet Take 1 tablet (200 mg total) by mouth daily. (Patient not taking: Reported on 06/18/2016) 30 tablet 6 Unknown at Unknown time  . liothyronine (CYTOMEL) 5 MCG tablet   4 Unknown at Unknown time  . liothyronine (CYTOMEL) 5 MCG tablet Take 10 mcg by mouth daily.   Unknown at Unknown time  . midodrine (PROAMATINE) 5 MG tablet Take 1 tablet (5 mg total) by mouth 2 (two) times daily with a meal. For high blood pressure (Patient not taking: Reported on 06/18/2016) 60 tablet 0 Unknown at Unknown time  . Omega-3 Fatty Acids (FISH OIL) 1000 MG CAPS Take 1,000 mg by mouth daily.   Unknown at Unknown time  . progesterone (PROMETRIUM) 100 MG capsule Take 100 mg by mouth See admin instructions. End of cycle.  4 Unknown at Unknown time  . thyroid (ARMOUR) 60 MG tablet Take 120 mg by mouth  daily before breakfast.   Unknown at Unknown time  . topiramate (TOPAMAX) 25 MG tablet Take 1 tablet (25 mg total) by mouth 2 (two) times daily. For mood stabilization (Patient not taking: Reported on 06/18/2016) 60 tablet 2 Unknown at Unknown time  . traZODone (DESYREL) 50 MG tablet Take 50 mg by mouth at bedtime.   Unknown at Unknown time  . valACYclovir (VALTREX) 1000 MG tablet Take 1,000 mg by mouth daily.  2 Unknown at Unknown time  . venlafaxine XR (EFFEXOR-XR) 75 MG 24 hr capsule Take 3 capsules (225 mg total) by mouth daily with breakfast. For depression (Patient not taking: Reported on 06/18/2016) 90 capsule 7 Unknown at Unknown time    Musculoskeletal: Strength & Muscle Tone: within normal limits Gait & Station: normal Patient leans: Right  Psychiatric Specialty Exam: Physical Exam  ROS  Blood pressure 90/62, pulse 90, temperature 98.8 F (37.1 C), temperature source Oral, resp. rate 18, height 5' 1.25" (1.556 m), weight 46.4 kg (102 lb 5 oz), SpO2 100 %.Body mass index is 19.17 kg/m.  General Appearance: Fairly Groomed  Eye Contact:  Fair  Speech:  Clear and Coherent and Normal Rate  Volume:  Normal  Mood:  Depressed  Affect:  Depressed, Restricted and Tearful  Thought Process:  Disorganized  Orientation:  Full (Time, Place, and Person)  Thought Content:  Denies hallucinations, delusions, and paranoia  Suicidal Thoughts:  Yes.  without intent/plan  Homicidal Thoughts:  No  Memory:  Immediate;   Fair Recent;   Fair Remote;   Fair  Judgement:  Impaired  Insight:  Lacking  Psychomotor Activity:  Normal  Concentration:  Concentration: Fair and Attention Span: Fair  Recall:  [employer] of Knowledge:  Good  Language:  Fair  Akathisia:  No  Handed:  Right  AIMS (if indicated):     Assets:  Communication Skills Desire for Improvement Housing Social Support  ADL's:  Intact  Cognition:  WNL  Sleep:       Treatment Plan Summary: Daily contact with patient to assess and  evaluate symptoms and progress in treatment and Medication management  Observation Level/Precautions:  15 minute checks  Laboratory:  CBC Chemistry Profile UDS  Ordered TSH  Psychotherapy:  Individual and group sessions  Medications: Lamictal 25 mg daily for mood stabilization Effexor XR 37.5 mg for depression Trazodone 50 mg  for Insomnia Vistaril 25 mg Q 6 hr for anxiety  Consultations:  As needed  Discharge Concerns:  Safety, stabilization, and access to medication  Estimated LOS:  5-7 days  Other:     Physician Treatment Plan for Primary Diagnosis: Bipolar I disorder, most recent episode depressed ([place]) Long Term Goal(s): Improvement in symptoms so as ready for discharge  Short Term Goals: Ability to verbalize feelings will improve, Ability to disclose and discuss suicidal ideas, Ability to identify and develop effective coping behaviors will improve, Compliance with prescribed medications will improve and Ability to identify triggers associated with substance abuse/mental health issues will improve  Physician Treatment Plan for Secondary Diagnosis: Principal Problem:   Bipolar I disorder, most recent episode depressed ([place]) Active Problems:   Bipolar 1 disorder, manic, full remission ([place])  Long Term Goal(s): Improvement in symptoms so as ready for discharge  Short Term Goals: Ability to identify changes in lifestyle to reduce recurrence of condition will improve, Ability to demonstrate self-control will improve, Ability to identify and develop effective coping behaviors will improve, Compliance with prescribed medications will improve and Ability to identify triggers associated with substance abuse/mental health issues will improve  I certify that inpatient services furnished can reasonably be expected to improve the patient's condition.    Earleen Newport, NP 4/8/201810:45 AM  I have discussed case with NP and have met with patient Agree with NP note as above Patient is a  46 year old female, reports history of Bipolar Disorder. Presented voluntarily  to ED on 4/7 reporting worsening mood episodes, with a recent manic episode that had then led to a ( current ) depressive episode . Reported feeling depressed, with suicidal ideations. Patient attributes worsening mood disorder to medication non compliance for several months . History of a prior admission to [facility] in 2016, for depression- diagnosed with Bipolar Disorder, depressed, and stabilized, discharged on Lamictal, Topamax, Effexor XR.  Patient denies drug or alcohol abuse .  States that combination of Lamictal and Effexor XR had been effective . She had stopped it because she had been feeling better, and because had wanted to " try just natural treatments, but it was a mistake, I got worse". Denies having had side effects.  Dx- Bipolar Disorder, Depressed  Plan - Inpatient admission- restart Effexor XR ( 37.5 mgrs QDAY ) and Lamictal ( 25 mgrs QDAY) which she reports have been effective and well tolerated in the past

## 2016-06-19 NOTE — BHH Counselor (Signed)
Adult Comprehensive Assessment  Patient ID: Tina [name], female   DOB: 03-08-1971, 46 y.o.   MRN: [id number]  Information Source: Information source: Patient  Current Stressors:  Employment / Job issues: Lost job recently was on STD Family Relationships: Yes, embararrased that she got off medicine and had another episode Surveyor, quantity / Lack of resources (include bankruptcy): not working Housing / Lack of housing: People are trying to help her keep her house Physical health (include injuries & life threatening diseases): Chronic [employer] so she says she's tired a lot Social relationships: Depresed and awkward all around Bereavement / Loss: A neighbor died and another friend died recently. Spontaneious  Living/Environment/Situation:  Living Arrangements: Alone Living conditions (as described by patient or guardian): 1 year What is atmosphere in current home:  (Stressful, because I don't know how I'm going to pay for everything)  Family History:  Marital status: Divorced Divorced, when?: 3 years What types of issues is patient dealing with in the relationship?: Emotional issue for pationt but no additional details Are you sexually active?: Yes What is your sexual orientation?: More sexually promiscuous diring manic phase Does patient have children?: Yes How many children?: 3 How is patient's relationship with their children?: Good relationship - has 50/50 custody  Childhood History:  By whom was/is the patient raised?: Mother Description of patient's relationship with caregiver when they were a child: Up and down Patient's description of current relationship with people who raised him/her: Up and down How were you disciplined when you got in trouble as a child/adolescent?: "I don't know." Wasn't really supervised as a teen. 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?: No Are There Guns or Other Weapons in Your Home?: No  Financial Resources:   Financial resources: Income from employment Does patient have a representative payee or guardian?: No  Alcohol/Substance Abuse:   What has been your use of drugs/alcohol within the last 12 months?: denies  Social Support System:   Patient's Community Support System: Good Describe Community Support System: They say it is but I don't feel contected to anything Type of faith/religion: No  Leisure/Recreation:   Leisure and Hobbies: Nothing  Strengths/Needs:   What things does the patient do well?: "I don't know" In what areas does patient struggle / problems for patient: "Everything"  Discharge Plan:   Does patient have access to transportation?: Yes Will patient be returning to same living situation after discharge?: Yes Currently receiving community mental health services: No If no, would patient like referral for services when discharged?: Yes (What county?) [facility] - that takes [facility]) Does patient have financial barriers related to discharge  medications?:  No  Summary/Recommendations:   Summary and Recommendations (to be completed by the evaluator): Patient is 46 year old female who presented to the ED after suicidal ideation. Patient was triggered by increasing depressive symptoms and noncompliance with medication. Patient would benefit from milieu of inpatient treatment including group therapy, medication management and discharge planning to support outpatient progress. Patient expected to decrease chronic symptoms and step down to lower level of behavioral health treatment in community setting.  Beverly Sessions. 06/19/2016

## 2016-06-19 NOTE — Progress Notes (Signed)
D: Pt was in her room upon initial approach.  Pt presents with depressed affect and mood.  She requests medication for sleep tonight.  Pt denies SI/HI, denies hallucinations, denies pain.  Pt has been visible in milieu at times.    A: Introduced self to pt.  Actively listened to pt and offered support and encouragement.  PRN medication administered for sleep.  Q15 minute safety checks maintained.  R: Pt is safe on the unit.  Pt verbally contracts for safety and reports that she will inform staff of needs and concerns.  Will continue to monitor and assess.

## 2016-06-19 NOTE — [facility] Suicide Risk Assessment (Signed)
[facility] INPATIENT:  Family/Significant Other Suicide Prevention Education  Suicide Prevention Education:  Patient Refusal for Family/Significant Other Suicide Prevention Education: The patient Tina [name] has refused to provide written consent for family/significant other to be provided Family/Significant Other Suicide Prevention Education during admission and/or prior to discharge.  Physician notified.  Beverly Sessions 06/19/2016, 2:43 PM

## 2016-06-19 NOTE — [facility] Suicide Risk Assessment (Addendum)
[facility] Admission Suicide Risk Assessment   Nursing information obtained from:  Patient Demographic factors:  Divorced or widowed, Caucasian, Living alone, Unemployed Current Mental Status:    Loss Factors:  Decrease in vocational status, Loss of significant relationship, Decline in physical health, Financial problems / change in socioeconomic status Historical Factors:  Impulsivity Risk Reduction Factors:  Responsible for children under 32 years of age, Sense of responsibility to family, Positive social support, Positive therapeutic relationship, Positive coping skills or problem solving skills  Total Time spent with patient: 45 minutes Principal Problem: Bipolar I disorder, most recent episode depressed (HCC) Diagnosis:   Patient Active Problem List   Diagnosis Date Noted  . Bipolar I disorder, most recent episode depressed (HCC) [F31.30] 06/19/2016  . MDD (major depressive disorder) [F32.9] 06/18/2016  . Bipolar 1 disorder, manic, full remission (HCC) [F31.74] 06/18/2016  . Migraine [G43.909] 02/20/2015  . Affective psychosis, bipolar (HCC) [F31.9]   . Bipolar disorder, current episode depressed, severe, without psychotic features (HCC) [F31.4]   . Bipolar affective disorder, current episode severe (HCC) [F31.9] 12/02/2014  . Bipolar I disorder, current or most recent episode depressed, in partial remission (HCC) [F31.75] 07/11/2014  . Bipolar 1 disorder, mixed (HCC) [F31.60] 06/27/2014  . Disease of thyroid gland [E07.9] 06/27/2014  . Anxiety [F41.9] 06/27/2014  . 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 Bipolar 1 disorder, depressed, partial remission (HCC) [F31.75] 06/27/2014  . 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 Alcohol use disorder, severe, dependence (HCC) [F10.20] 06/24/2014  . Bipolar disorder, unspecified [F31.9] 02/16/2013  . Adrenal insufficiency (HCC) [E27.40]   . Acne [L70.9]   .  Hx of Lyme disease [Z86.19]   . 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 Bipolar I disorder, most recent episode (or current) manic (HCC) [F31.10] 08/29/2012  . Hypothyroidism [E03.9] 12/23/2010  . Myalgia [M79.1] 12/23/2010  . Fatigue [R53.83] 12/23/2010  . Unspecified vitamin deficiency [E56.9] 12/23/2010  . Depression [F32.9]     Continued Clinical Symptoms:  Alcohol Use Disorder Identification Test Final Score (AUDIT): 1 The "Alcohol Use Disorders Identification Test", Guidelines for Use in Primary Care, Second Edition.  World Science writer [facility]). Score between 0-7:  no or low risk or alcohol related problems. Score between 8-15:  moderate risk of alcohol related problems. Score between 16-19:  high risk of alcohol related problems. Score 20 or above:  warrants further diagnostic evaluation for alcohol dependence and treatment.   CLINICAL FACTORS:  Patient is a 46 year old female, reports history of Bipolar Disorder. Presented voluntarily  to ED on 4/7 reporting worsening mood episodes, with a recent manic episode that had then led to a ( current ) depressive episode . Reported feeling depressed, with suicidal ideations. Patient attributes worsening mood disorder to medication non compliance for several months . History of a prior admission to [facility] in 2016, for depression- diagnosed with Bipolar Disorder, depressed, and stabilized, discharged on Lamictal, Topamax, Effexor XR.  Patient denies drug or alcohol abuse .  States that combination of Lamictal and Effexor XR had been effective . She had stopped it because she had been feeling better, and because had wanted to " try just natural treatments, but it was a mistake, I got worse". Denies having had side effects.  Dx- Bipolar Disorder, Depressed  Plan - Inpatient admission- restart Effexor XR ( 37.5 mgrs QDAY ) and Lamictal ( 25 mgrs QDAY) which she reports have been effective and well tolerated in the  past       Musculoskeletal: Strength & Muscle Tone: within normal limits Gait & Station: normal Patient leans: N/A  Psychiatric Specialty Exam: Physical Exam  ROS denies chest pain,no shortness of breath, no vomiting .  Blood pressure 90/62, pulse 90, temperature 98.8 F (37.1 C), temperature source Oral, resp. rate 18, height 5' 1.25" (1.556 m), weight 46.4 kg (102 lb 5 oz), SpO2 100 %.Body mass index is 19.17 kg/m.  General Appearance: Fairly Groomed  Eye Contact:  Good  Speech:  Normal Rate  Volume:  Normal  Mood:  reports feeling depressed, sad   Affect:  constricted affect, but does smile briefly at times   Thought Process:  Linear and Descriptions of Associations: Intact  Orientation:  Other:  fully alert and attentive   Thought Content:  denies hallucinations, no delusions , not internally preoccupied   Suicidal Thoughts:  No today denies any suicidal or self injurious ideations, also denies any homicidal or violent ideations   Homicidal Thoughts:  No  Memory:  recent and remote grossly intact   Judgement:  Fair  Insight:  Fair  Psychomotor Activity:  Decreased  Concentration:  Concentration: Good and Attention Span: Good  Recall:  Good  Fund of Knowledge:  Good  Language:  Good  Akathisia:  Negative  Handed:  Right  AIMS (if indicated):     Assets:  Desire for Improvement Resilience  ADL's:  Intact  Cognition:  WNL  Sleep:         COGNITIVE FEATURES THAT CONTRIBUTE TO RISK:  Closed-mindedness and Loss of executive function    SUICIDE RISK:   Moderate:  Frequent suicidal ideation with limited intensity, and duration, some specificity in terms of plans, no associated intent, good self-control, limited dysphoria/symptomatology, some risk factors present, and identifiable protective factors, including available and accessible social support.  PLAN OF CARE: Patient will be admitted to inpatient psychiatric unit for stabilization and safety. Will provide and encourage  milieu participation. Provide medication management and maked adjustments as needed.  Will follow daily.    I certify that inpatient services furnished can reasonably be expected to improve the patient's condition.   Craige Cotta, MD 06/19/2016, 2:33 PM

## 2016-06-19 NOTE — [facility] Group Notes (Signed)
  [facility] LCSW Group Therapy Note    06/19/2016 10 AM   Type of Therapy and Topic: Group Therapy: Feelings Around Returning Home & Establishing a Supportive Framework and Activity to Identify signs of Improvement or Decompensation   Participation Level:  Did not attend as patient was asleep and later meeting with other members of treatment team.    Description of Group:  Patients first processed thoughts and feelings about up coming discharge. These included fears of upcoming changes, lack of change, new living environments, judgements and expectations from others and overall stigma of MH issues. We then discussed what is a supportive framework? What does it look like feel like and how do I discern it from and unhealthy non-supportive network? Learn how to cope when supports are not helpful and don't support you. Discuss what to do when your family/friends are not supportive.   Carney Bern, LCSW

## 2016-06-19 NOTE — Progress Notes (Signed)
Patient ID: Tina [name], female   DOB: [birth year], 46 y.o.   MRN: [id number]  DAR: Pt. Denies SI/HI and A/V Hallucinations. She reports sleep is good, appetite is poor, energy level is low, and concentration is poor. She rates depression 9/10, hopelessness 9/10, and anxiety 8/10. She reports feeling dizzy and lightheaded. She reports, "I just don't feel good." Patient's BP was low this morning, patient encouraged to drink plenty of fluids. Water is placed by her bed and pt verbalized understanding. Support and encouragement provided to the patient. Patient reports she is positive for benzodiazepines because, "I took one of my friend's Xanax because I was having a lot of anxiety." Patient denies withdrawal symptoms at this time. She was provided with information about withdrawal symptoms to be aware of in case she begins to experience them. Patient is minimal and isolated to her room throughout the morning. Patient refused her nicotine patch this morning. Her affect is flat and she avoids eye contact during morning conversation. Q15 minute checks are maintained for safety.

## 2016-06-20 DIAGNOSIS — Z810 Family history of intellectual disabilities: Secondary | ICD-10-CM

## 2016-06-20 DIAGNOSIS — F17210 Nicotine dependence, cigarettes, uncomplicated: Secondary | ICD-10-CM

## 2016-06-20 LAB — GC/CHLAMYDIA PROBE AMP (~~LOC~~) NOT AT ARMC
CHLAMYDIA, DNA PROBE: NEGATIVE
NEISSERIA GONORRHEA: NEGATIVE

## 2016-06-20 MED ORDER — METRONIDAZOLE 500 MG PO TABS
500.0000 mg | ORAL_TABLET | Freq: Two times a day (BID) | ORAL | Status: DC
  Administered 2016-06-20 – 2016-06-26 (×12): 500 mg via ORAL
  Filled 2016-06-20 (×2): qty 1
  Filled 2016-06-20: qty 2
  Filled 2016-06-20 (×6): qty 1
  Filled 2016-06-20: qty 2
  Filled 2016-06-20 (×3): qty 1
  Filled 2016-06-20: qty 2
  Filled 2016-06-20 (×5): qty 1

## 2016-06-20 MED ORDER — TRAZODONE HCL 150 MG PO TABS
75.0000 mg | ORAL_TABLET | Freq: Every evening | ORAL | Status: DC | PRN
  Administered 2016-06-20 – 2016-06-21 (×2): 75 mg via ORAL
  Filled 2016-06-20 (×2): qty 1

## 2016-06-20 NOTE — Progress Notes (Signed)
[facility] MD Progress Note  06/20/2016 3:16 PM Tina [name]  MRN:  [id number] Subjective:  Patient states that she slept but that Trazodone brought her blood pressure down Objective:  Patient was admitted with worsening depression.  Patient states that she stopped her medications for Bipolar D/O and just came off a manic episode before admission.  She states that she quickly decompensated, hyper sexual tendencies and spent money over.  She denies SI today and has resumed her medications (Effexor and Lamictal).   Principal Problem: Bipolar I disorder, most recent episode depressed (HCC) Diagnosis:   Patient Active Problem List   Diagnosis Date Noted  . Bipolar I disorder, most recent episode depressed (HCC) [F31.30] 06/19/2016  . MDD (major depressive disorder) [F32.9] 06/18/2016  . Bipolar 1 disorder, manic, full remission (HCC) [F31.74] 06/18/2016  . Migraine [G43.909] 02/20/2015  . Affective psychosis, bipolar (HCC) [F31.9]   . Bipolar disorder, current episode depressed, severe, without psychotic features (HCC) [F31.4]   . Bipolar affective disorder, current episode severe (HCC) [F31.9] 12/02/2014  . Bipolar I disorder, current or most recent episode depressed, in partial remission (HCC) [F31.75] 07/11/2014  . Bipolar 1 disorder, mixed (HCC) [F31.60] 06/27/2014  . Disease of thyroid gland [E07.9] 06/27/2014  . Anxiety [F41.9] 06/27/2014  . 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 Bipolar 1 disorder, depressed, partial remission (HCC) [F31.75] 06/27/2014  . 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 Alcohol use disorder, severe, dependence (HCC) [F10.20] 06/24/2014  . Bipolar disorder, unspecified [F31.9] 02/16/2013  . Adrenal insufficiency (HCC) [E27.40]   . Acne [L70.9]   . Hx of Lyme disease [Z86.19]   . 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 Bipolar I disorder, most recent episode (or current)  manic (HCC) [F31.10] 08/29/2012  . Hypothyroidism [E03.9] 12/23/2010  . Myalgia [M79.1] 12/23/2010  . Fatigue [R53.83] 12/23/2010  . Unspecified vitamin deficiency [E56.9] 12/23/2010  . Depression [F32.9]    Total Time spent with patient: 30 minutes  Past Psychiatric History: see HPI  Past Medical History:  Past Medical History:  Diagnosis Date  . Acne   . Adrenal insufficiency (HCC)   . Bipolar 2 disorder (HCC)   . Cellulitis   . Depression   . Genital warts   . Headache   . History of chicken pox   . Hx of Lyme disease   . Hypothyroidism     Past Surgical History:  Procedure Laterality Date  . CESAREAN SECTION  2003, 2007, 2011  . INGUINAL HERNIA REPAIR Bilateral   . TONSILLECTOMY    . UMBILICAL HERNIA REPAIR     Family History:  Family History  Problem Relation Age of Onset  . Other Mother     PAD  . Heart disease Other     Grandparent  . Alcohol abuse Paternal Grandmother   . Mental retardation Paternal Grandmother   . Arthritis Maternal Grandfather   . Heart disease Maternal Grandfather    Family Psychiatric  History: see HPI Social History:  History  Alcohol Use  . Yes    Comment: social drinker-few drinks a week      History  Drug Use No    Social History   Social History  . Marital status: Single    Spouse name: N/A  . Number of children: N/A  . Years of education: 26   Occupational History  . HOMEMAKER    Social History Main Topics  . Smoking status: Current Every Day  Smoker    Packs/day: 1.50    Years: 29.00    Types: Cigarettes  . Smokeless tobacco: Never Used  . Alcohol use Yes     Comment: social drinker-few drinks a week   . Drug use: No  . Sexual activity: Not Currently   Other Topics Concern  . None   Social History Narrative   Regular exercise-no   Additional Social History:    Pain Medications: occ alcohol Prescriptions: none                    Sleep: Fair  Appetite:  Fair  Current  Medications: Current Facility-Administered Medications  Medication Dose Route Frequency Provider Last Rate Last Dose  . hydrOXYzine (ATARAX/VISTARIL) tablet 25 mg  25 mg Oral Q6H PRN Craige Cotta, MD   25 mg at 06/20/16 1224  . ibuprofen (ADVIL,MOTRIN) tablet 600 mg  600 mg Oral Q6H PRN Shuvon B Rankin, NP   600 mg at 06/19/16 1647  . lamoTRIgine (LAMICTAL) tablet 25 mg  25 mg Oral Daily Shuvon B Rankin, NP   25 mg at 06/20/16 0828  . nicotine (NICODERM CQ - dosed in mg/24 hours) patch 21 mg  21 mg Transdermal Daily Craige Cotta, MD   21 mg at 06/20/16 0831  . thyroid (ARMOUR) tablet 90 mg  90 mg Oral QAC breakfast Craige Cotta, MD   90 mg at 06/20/16 9147  . traZODone (DESYREL) tablet 100 mg  100 mg Oral QHS PRN Craige Cotta, MD   100 mg at 06/19/16 2159  . venlafaxine XR (EFFEXOR-XR) 24 hr capsule 37.5 mg  37.5 mg Oral Q breakfast Shuvon B Rankin, NP   37.5 mg at 06/20/16 8295    Lab Results:  Results for orders placed or performed during the hospital encounter of 06/18/16 (from the past 48 hour(s))  TSH     Status: Abnormal   Collection Time: 06/19/16  6:28 PM  Result Value Ref Range   TSH <0.010 (L) 0.350 - 4.500 uIU/mL    Comment: Performed by a 3rd Generation assay with a functional sensitivity of <=0.01 uIU/mL. Performed at [facility], [address], [place], [place] [address]     Blood Alcohol level:  Lab Results  Component Value Date   [facility] <5 06/18/2016   ETH <5 12/01/2014    Metabolic Disorder Labs: Lab Results  Component Value Date   HGBA1C 5.1 12/03/2014   MPG 100 12/03/2014   MPG 100 12/02/2014   No results found for: PROLACTIN Lab Results  Component Value Date   CHOL 195 12/03/2014   TRIG 122 12/03/2014   HDL 42 12/03/2014   CHOLHDL 4.6 12/03/2014   VLDL 24 12/03/2014   LDLCALC 129 (H) 12/03/2014    Physical Findings: AIMS: Facial and Oral Movements Lips and Perioral Area: None, normal Jaw: None, normal Tongue:  None, normal,Extremity Movements Upper (arms, wrists, hands, fingers): None, normal Lower (legs, knees, ankles, toes): None, normal, Trunk Movements Neck, shoulders, hips: None, normal, Overall Severity Severity of abnormal movements (highest score from questions above): None, normal Incapacitation due to abnormal movements: None, normal Patient's awareness of abnormal movements (rate only patient's report): No Awareness, Dental Status Current problems with teeth and/or dentures?: No Does patient usually wear dentures?: No  CIWA:  CIWA-Ar Total: 5 COWS:  COWS Total Score: 1  Musculoskeletal: Strength & Muscle Tone: within normal limits Gait & Station: normal Patient leans: N/A  Psychiatric Specialty Exam: Physical Exam  Nursing note and vitals reviewed.   ROS  Blood pressure (!) 88/55, pulse 62, temperature 97.4 F (36.3 C), temperature source Oral, resp. rate 16, height 5' 1.25" (1.556 m), weight 46.4 kg (102 lb 5 oz), SpO2 100 %.Body mass index is 19.17 kg/m.  General Appearance: Fairly Groomed  Eye Contact:  Fair  Speech:  Clear and Coherent  Volume:  Normal  Mood:  Depressed  Affect:  Depressed and Tearful  Thought Process:  Disorganized  Orientation:  Full (Time, Place, and Person)  Thought Content:  Rumination  Suicidal Thoughts:  No denies  Homicidal Thoughts:  No  Memory:  Immediate;   Fair Recent;   Fair Remote;   Fair  Judgement:  Impaired  Insight:  Lacking  Psychomotor Activity:  Normal  Concentration:  Concentration: Fair and Attention Span: Fair  Recall:  [employer] of Knowledge:  Fair  Language:  Fair  Akathisia:  No  Handed:  Right  AIMS (if indicated):     Assets:  Desire for Improvement  ADL's:  Intact  Cognition:  WNL  Sleep:  Number of Hours: 6.75   Treatment Plan Summary: Review of chart, vital signs, medications, and notes.  1-Individual and group therapy  2-Medication management for depression and anxiety: Medications reviewed with the  patient.  Resumed her medications Lamictal and Effexor.  Patient was started on Flagyl as she c/o malodor and discomfort, dosing to start after patient give sample of urine for GC/Chlam and Trich. 3-Coping skills for depression, anxiety  4-Continue crisis stabilization and management  5-Address health issues--monitoring vital signs, stable  6-Treatment plan in progress to prevent relapse of depression and anxiety  Lindwood Qua, NP [facility] 06/20/2016, 3:16 PM   Agree with NP progress note

## 2016-06-20 NOTE — Progress Notes (Signed)
Patient ID: Tina [name], female   DOB: 19-Oct-1970, 46 y.o.   MRN: [id number]  Writer spoke with Beryle Beams in [place] lab about the test for Trichomonas. Per lab, patient is able to give urine sample for testing but as "fresh" as possible. Patient was given urine specimen cup and requested that patient provide sample but close to time that lab will come to collect specimen. Patient verbalized understanding.

## 2016-06-20 NOTE — [facility] Group Notes (Signed)
[facility] LCSW Group Therapy  06/20/2016 1:04 PM  Type of Therapy:  Group Therapy  Participation Level:  Minimal  Participation Quality:  Attentive  Affect:  Appropriate  Cognitive:  Alert and Oriented  Insight:  Improving  Engagement in Therapy:  Improving  Modes of Intervention:  Confrontation, Discussion, Education, Problem-solving, Socialization and Support  Summary of Progress/Problems: Today's Topic: Overcoming Obstacles. Patients identified one short term goal and potential obstacles in reaching this goal. Patients processed barriers involved in overcoming these obstacles. Patients identified steps necessary for overcoming these obstacles and explored motivation (internal and external) for facing these difficulties head on.   Noris Kulinski N Smart LCSW  06/20/2016, 1:04 PM

## 2016-06-20 NOTE — Social Work (Signed)
Referred to [facility], is [place] resident.  Anne Cunningham, LCSW Lead Clinical Social Worker Phone:  [phone]  

## 2016-06-20 NOTE — Progress Notes (Signed)
Patient ID: Tina [name], female   DOB: 1970-09-14, 46 y.o.   MRN: [id number]  DAR: Pt. Denies SI/HI and A/V Hallucinations during shift assessment. She reports on her daily inventory that she does have SI thoughts sometimes, she is able to contract for safety. She reports sleep is good, appetite is poor, energy level is low, and concentration is poor. Patient does not report any pain however does report anxiety before lunch. She received PRN Vistaril which helped to decrease some of her anxiety. She rates her anxiety 7/10, hopelessness 8/10, and depression 8/10. She reports continued dizziness and lightheadedness. He BP is low, MD Eappen was made aware. Patient is able to ambulate without apparent distress. She is encouraged to drink plenty of fluids and decrease her risk for falls by wearing non-skid socks and safe during ambulation and switching positions from laying to sitting to standing position. Support and encouragement provided to the patient. Scheduled medications administered to patient per physician's orders. Patient is assertive and cooperative. She is seen in her room resting in bed throughout the morning but as the day progresses she is more visible in the milieu. Q15 minute checks are maintained for safety.

## 2016-06-20 NOTE — Progress Notes (Signed)
Recreation Therapy Notes  Date: 06/20/16 Time: 0930 Location: [address] Dayroom  Group Topic: Stress Management  Goal Area(s) Addresses:  Patient will verbalize importance of using healthy stress management.  Patient will identify positive emotions associated with healthy stress management.   Intervention: Stress Management  Activity :  Guided Imagery.  LRT introduced the stress management technique of guided imagery.  LRT read a script to allow patients to participate and engage in the technique.  Patients were to follow along as the script was read to engage in the activity.  Education:  Stress Management, Discharge Planning.   Education Outcome: Acknowledges edcuation/In group clarification offered/Needs additional education  Clinical Observations/Feedback: Pt did not attend group.   Lindee Leason, LRT/CTRS         Arely Tinner A 06/20/2016 11:02 AM 

## 2016-06-20 NOTE — Tx Team (Signed)
Interdisciplinary Treatment and Diagnostic Plan Update  06/20/2016 Time of Session: 0930 Tina [name] MRN: [id number]  Principal Diagnosis: Bipolar I disorder, most recent episode depressed (HCC)  Secondary Diagnoses: Principal Problem:   Bipolar I disorder, most recent episode depressed (HCC) Active Problems:   Bipolar 1 disorder, manic, full remission (HCC)   Current Medications:  Current Facility-Administered Medications  Medication Dose Route Frequency Provider Last Rate Last Dose  . hydrOXYzine (ATARAX/VISTARIL) tablet 25 mg  25 mg Oral Q6H PRN Craige Cotta, MD   25 mg at 06/19/16 1949  . ibuprofen (ADVIL,MOTRIN) tablet 600 mg  600 mg Oral Q6H PRN Shuvon B Rankin, NP   600 mg at 06/19/16 1647  . lamoTRIgine (LAMICTAL) tablet 25 mg  25 mg Oral Daily Shuvon B Rankin, NP   25 mg at 06/19/16 1146  . nicotine (NICODERM CQ - dosed in mg/24 hours) patch 21 mg  21 mg Transdermal Daily Rockey Situ Cobos, MD   21 mg at 06/19/16 1640  . thyroid (ARMOUR) tablet 90 mg  90 mg Oral QAC breakfast Craige Cotta, MD   90 mg at 06/20/16 9562  . traZODone (DESYREL) tablet 100 mg  100 mg Oral QHS PRN Craige Cotta, MD   100 mg at 06/19/16 2159  . venlafaxine XR (EFFEXOR-XR) 24 hr capsule 37.5 mg  37.5 mg Oral Q breakfast Shuvon B Rankin, NP   37.5 mg at 06/19/16 1159   PTA Medications: Prescriptions Prior to Admission  Medication Sig Dispense Refill Last Dose  . ARMOUR THYROID 90 MG tablet   4 06/17/2016 at Unknown time  . amphetamine-dextroamphetamine (ADDERALL) 10 MG tablet 1  Bid  Fill after 09/12/2015 60 tablet 0 Unknown at Unknown time  . Ascorbic Acid (VITAMIN C) 100 MG tablet Take 100 mg by mouth daily.   Unknown at Unknown time  . aspirin-acetaminophen-caffeine (EXCEDRIN MIGRAINE) 250-250-65 MG tablet Take 1 tablet by mouth every 6 (six) hours as needed for headache.   Unknown at Unknown time  . B Complex Vitamins (VITAMIN B COMPLEX 100) INJ Inject 1,000 mg as directed See admin  instructions. Every 2 weeks   Unknown at Unknown time  . cholecalciferol (VITAMIN D) 1000 units tablet Take 1,000 Units by mouth daily.   Unknown at Unknown time  . clindamycin (CLEOCIN T) 1 % external solution Apply topically 2 (two) times daily. (Patient not taking: Reported on 06/18/2016) 30 mL 3 Unknown at Unknown time  . cyanocobalamin (,VITAMIN B-12,) 1000 MCG/ML injection   4 Unknown at Unknown time  . Hydroxocobalamin 1000 MCG/ML SOLN Inject 1 mL into the skin See admin instructions.  1 Unknown at Unknown time  . lamoTRIgine (LAMICTAL) 200 MG tablet Take 1 tablet (200 mg total) by mouth daily. (Patient not taking: Reported on 06/18/2016) 30 tablet 6 Unknown at Unknown time  . liothyronine (CYTOMEL) 5 MCG tablet   4 Unknown at Unknown time  . liothyronine (CYTOMEL) 5 MCG tablet Take 10 mcg by mouth daily.   Unknown at Unknown time  . midodrine (PROAMATINE) 5 MG tablet Take 1 tablet (5 mg total) by mouth 2 (two) times daily with a meal. For high blood pressure (Patient not taking: Reported on 06/18/2016) 60 tablet 0 Unknown at Unknown time  . Omega-3 Fatty Acids (FISH OIL) 1000 MG CAPS Take 1,000 mg by mouth daily.   Unknown at Unknown time  . progesterone (PROMETRIUM) 100 MG capsule Take 100 mg by mouth See admin instructions. End of cycle.  4  Unknown at Unknown time  . thyroid (ARMOUR) 60 MG tablet Take 120 mg by mouth daily before breakfast.   Unknown at Unknown time  . topiramate (TOPAMAX) 25 MG tablet Take 1 tablet (25 mg total) by mouth 2 (two) times daily. For mood stabilization (Patient not taking: Reported on 06/18/2016) 60 tablet 2 Unknown at Unknown time  . traZODone (DESYREL) 50 MG tablet Take 50 mg by mouth at bedtime.   Unknown at Unknown time  . valACYclovir (VALTREX) 1000 MG tablet Take 1,000 mg by mouth daily.  2 Unknown at Unknown time  . venlafaxine XR (EFFEXOR-XR) 75 MG 24 hr capsule Take 3 capsules (225 mg total) by mouth daily with breakfast. For depression (Patient not taking:  Reported on 06/18/2016) 90 capsule 7 Unknown at Unknown time    Patient Stressors: Financial difficulties Health problems Legal issue Marital or family conflict Medication change or noncompliance Traumatic event  Patient Strengths: Ability for insight Active sense of humor Average or above average intelligence Capable of independent living Metallurgist fund of knowledge  Treatment Modalities: Medication Management, Group therapy, Case management,  1 to 1 session with clinician, Psychoeducation, Recreational therapy.   Physician Treatment Plan for Primary Diagnosis: Bipolar I disorder, most recent episode depressed (HCC) Long Term Goal(s): Improvement in symptoms so as ready for discharge Improvement in symptoms so as ready for discharge   Short Term Goals: Ability to verbalize feelings will improve Ability to disclose and discuss suicidal ideas Ability to identify and develop effective coping behaviors will improve Compliance with prescribed medications will improve Ability to identify triggers associated with substance abuse/mental health issues will improve Ability to identify changes in lifestyle to reduce recurrence of condition will improve Ability to demonstrate self-control will improve Ability to identify and develop effective coping behaviors will improve Compliance with prescribed medications will improve Ability to identify triggers associated with substance abuse/mental health issues will improve  Medication Management: Evaluate patient's response, side effects, and tolerance of medication regimen.  Therapeutic Interventions: 1 to 1 sessions, Unit Group sessions and Medication administration.  Evaluation of Outcomes: Progressing  Physician Treatment Plan for Secondary Diagnosis: Principal Problem:   Bipolar I disorder, most recent episode depressed (HCC) Active Problems:   Bipolar 1 disorder, manic, full remission (HCC)  Long  Term Goal(s): Improvement in symptoms so as ready for discharge Improvement in symptoms so as ready for discharge   Short Term Goals: Ability to verbalize feelings will improve Ability to disclose and discuss suicidal ideas Ability to identify and develop effective coping behaviors will improve Compliance with prescribed medications will improve Ability to identify triggers associated with substance abuse/mental health issues will improve Ability to identify changes in lifestyle to reduce recurrence of condition will improve Ability to demonstrate self-control will improve Ability to identify and develop effective coping behaviors will improve Compliance with prescribed medications will improve Ability to identify triggers associated with substance abuse/mental health issues will improve     Medication Management: Evaluate patient's response, side effects, and tolerance of medication regimen.  Therapeutic Interventions: 1 to 1 sessions, Unit Group sessions and Medication administration.  Evaluation of Outcomes: Progressing   RN Treatment Plan for Primary Diagnosis: Bipolar I disorder, most recent episode depressed (HCC) Long Term Goal(s): Knowledge of disease and therapeutic regimen to maintain health will improve  Short Term Goals: Ability to remain free from injury will improve, Ability to verbalize feelings will improve and Ability to disclose and discuss suicidal ideas  Medication Management: RN  will administer medications as ordered by provider, will assess and evaluate patient's response and provide education to patient for prescribed medication. RN will report any adverse and/or side effects to prescribing provider.  Therapeutic Interventions: 1 on 1 counseling sessions, Psychoeducation, Medication administration, Evaluate responses to treatment, Monitor vital signs and CBGs as ordered, Perform/monitor CIWA, COWS, AIMS and Fall Risk screenings as ordered, Perform wound care  treatments as ordered.  Evaluation of Outcomes: Progressing   LCSW Treatment Plan for Primary Diagnosis: Bipolar I disorder, most recent episode depressed (HCC) Long Term Goal(s): Safe transition to appropriate next level of care at discharge, Engage patient in therapeutic group addressing interpersonal concerns.  Short Term Goals: Engage patient in aftercare planning with referrals and resources, Facilitate patient progression through stages of change regarding substance use diagnoses and concerns and Identify triggers associated with mental health/substance abuse issues  Therapeutic Interventions: Assess for all discharge needs, 1 to 1 time with Social worker, Explore available resources and support systems, Assess for adequacy in community support network, Educate family and significant other(s) on suicide prevention, Complete Psychosocial Assessment, Interpersonal group therapy.  Evaluation of Outcomes: Progressing   Progress in Treatment: Attending groups: Yes. Participating in groups: Yes. Taking medication as prescribed: Yes. Toleration medication: Yes. Family/Significant other contact made: SPE completed with pt; pt declined to consent to family contact.  Patient understands diagnosis: Yes. Discussing patient identified problems/goals with staff: Yes. Medical problems stabilized or resolved: Yes. Denies suicidal/homicidal ideation: Yes. Issues/concerns per patient self-inventory: No. Other: n/a   New problem(s) identified: No, Describe:  n/a  New Short Term/Long Term Goal(s): detox; medication stabilization; development of comprehensive mental wellness/sobriety plan.   Discharge Plan or Barriers: CSW assessing for appropriate referrals. Pt [place] and [place] in [place].   Reason for Continuation of Hospitalization: Depression Medication stabilization Suicidal ideation Withdrawal symptoms  Estimated Length of Stay: 3-5 days    Attendees: Patient: 06/20/2016 8:11 AM  Physician: Dr. Elna Breslow MD 06/20/2016 8:11 AM  Nursing: Rodell Perna RN; Christa RN 06/20/2016 8:11 AM  RN Care Manager: Onnie Boer CM 06/20/2016 8:11 AM  Social Worker: Chartered loss adjuster, LCSW 06/20/2016 8:11 AM  Recreational Therapist: x 06/20/2016 8:11 AM  Other: May Augustin NP 06/20/2016 8:11 AM  Other:  06/20/2016 8:11 AM  Other: 06/20/2016 8:11 AM    Scribe for Treatment Team: Ledell Peoples Smart, LCSW 06/20/2016 8:11 AM

## 2016-06-21 LAB — GC/CHLAMYDIA PROBE AMP (~~LOC~~) NOT AT ARMC
Chlamydia: NEGATIVE
Neisseria Gonorrhea: NEGATIVE
Trichomonas: NEGATIVE

## 2016-06-21 LAB — BASIC METABOLIC PANEL
Anion gap: 4 — ABNORMAL LOW (ref 5–15)
BUN: 12 mg/dL (ref 6–20)
CO2: 32 mmol/L (ref 22–32)
CREATININE: 0.66 mg/dL (ref 0.44–1.00)
Calcium: 8.8 mg/dL — ABNORMAL LOW (ref 8.9–10.3)
Chloride: 101 mmol/L (ref 101–111)
Glucose, Bld: 111 mg/dL — ABNORMAL HIGH (ref 65–99)
Potassium: 3.8 mmol/L (ref 3.5–5.1)
SODIUM: 137 mmol/L (ref 135–145)

## 2016-06-21 MED ORDER — VENLAFAXINE HCL ER 75 MG PO CP24
75.0000 mg | ORAL_CAPSULE | Freq: Every day | ORAL | Status: DC
  Administered 2016-06-22 – 2016-06-26 (×5): 75 mg via ORAL
  Filled 2016-06-21 (×8): qty 1

## 2016-06-21 MED ORDER — VENLAFAXINE HCL ER 37.5 MG PO CP24
37.5000 mg | ORAL_CAPSULE | Freq: Once | ORAL | Status: AC
  Administered 2016-06-21: 37.5 mg via ORAL
  Filled 2016-06-21 (×2): qty 1

## 2016-06-21 MED ORDER — ARIPIPRAZOLE 5 MG PO TABS
5.0000 mg | ORAL_TABLET | Freq: Every day | ORAL | Status: DC
  Administered 2016-06-21 – 2016-06-23 (×3): 5 mg via ORAL
  Filled 2016-06-21 (×8): qty 1

## 2016-06-21 MED ORDER — BISACODYL 5 MG PO TBEC
10.0000 mg | DELAYED_RELEASE_TABLET | Freq: Once | ORAL | Status: AC
  Administered 2016-06-21: 10 mg via ORAL
  Filled 2016-06-21 (×2): qty 2

## 2016-06-21 MED ORDER — MAGNESIUM HYDROXIDE 400 MG/5ML PO SUSP
30.0000 mL | Freq: Every day | ORAL | Status: DC | PRN
  Administered 2016-06-21: 30 mL via ORAL
  Filled 2016-06-21: qty 30

## 2016-06-21 NOTE — Progress Notes (Signed)
D: Pt was in bed in her room upon initial approach.  Pt presents with depressed affect and mood.  She reports she is feeling "low."  Pt denies having a goal for the day.  Pt denies SI/HI, denies hallucinations, denies pain.  Pt stayed in her room for the majority of the evening.  She forwards little information to Buyer, retail.    A: Offered support and encouragement. Medication administered per order.  PRN medication administered for sleep.  PO fluids encouraged and provided.  Q15 minute safety checks maintained.  R: Pt is safe on the unit.  Pt is compliant with medication.  Pt verbally contracts for safety.  Will continue to monitor and assess.

## 2016-06-21 NOTE — Progress Notes (Signed)
Nursing Progress Note: 7p-7a D: Pt currently presents with a sullen/depressed/anxious affect and behavior. Pt states "My depression is 7 out of 10. I'm still feeling really down, and it is too soon to assume medication is working." Interacting minimally with milieu. Pt reports good sleep with current medication regimen.   A: Pt provided with medications per providers orders. Pt's labs and vitals were monitored throughout the night. Pt supported emotionally and encouraged to express concerns and questions. Pt educated on medications.  R: Pt's safety ensured with 15 minute and environmental checks. Pt currently denies SI/HI/Self Harm and AVH. Pt verbally contracts to seek staff if SI/HI or A/VH occurs and to consult with staff before acting on any harmful thoughts. Will continue to monitor.

## 2016-06-21 NOTE — [facility] Group Notes (Signed)
[facility] LCSW Group Therapy  06/21/2016 1:12 PM  Type of Therapy:  Group Therapy  Participation Level:  Did Not Attend-pt invited. Chose to remain in bed.   Summary of Progress/Problems: MHA Speaker came to talk about his personal journey with substance abuse and addiction. The pt processed ways by which to relate to the speaker. MHA speaker provided handouts and educational information pertaining to groups and services offered by the [facility].   Cedarius Kersh N Smart LCSW 06/21/2016, 1:12 PM

## 2016-06-21 NOTE — Plan of Care (Signed)
Problem: Safety: Goal: Periods of time without injury will increase Outcome: Progressing Pt has not harmed self or others tonight.  She denies SI/HI and verbally contracts for safety.    

## 2016-06-21 NOTE — Plan of Care (Signed)
Problem: Education: Goal: Verbalization of understanding the information provided will improve Outcome: Progressing Patient verbalizes understanding of information, education provided.   Problem: Medication: Goal: Compliance with prescribed medication regimen will improve Outcome: Progressing Patient is med compliant.   

## 2016-06-21 NOTE — [facility] Group Notes (Signed)
[facility] Group Notes:  (Nursing/MHT/Case Management/Adjunct)  Date:  06/21/2016  Time:  0900  Type of Therapy:  Nurse Education  Participation Level:  Did Not Attend  Participation Quality:    Affect:    Cognitive:    Insight:    Engagement in Group:    Modes of Intervention:    Summary of Progress/Problems: Patient invited to attend however elected to remain in bed.  Merian Capron [facility] 06/21/2016, 0930

## 2016-06-21 NOTE — Progress Notes (Addendum)
[facility] MD Progress Note  06/21/2016 12:11 PM Tina [name]  MRN:  [id number] Subjective:  46 yo Caucasian female, divorced, lives alone. Background history of Bipolar disorder. Patient has been off her psychotropic medications since August last year. She reports mood swings with periods of elation. She presented in company of her family because she has been expressing thoughts of suicide. She reports being depressed lately.  Nursing staff reports that she is a bit withdrawn. She reports feeling marginally better. She has not been participating with all unit groups and activities. She denies any suicidal thoughts here. She has been tolerating her medications well. 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   Seen today. Says she is still down. Not motivated to do things. Says she spent a lot of money while manic. She also lost her job while manic. She has realized the damage now. Her ex-husband has their three kids now. Her children are doing well. Says her family has been supportive emotionally and financially. Patient says she had struggled to sleep until she got admitted. She is marginally better as she is no longer having suicidal thoughts. She is tolerating her medications well. No associated manic features. No associated psychosis.  I discussed medication adjustments. I discussed the need to have her on a mood stabilizer so that we do not precipitate antidepressant induced mania. I explained that it would take weeks to get Lamotrigine up to a mood stabilizing dose. We explored what has helped in the past. Patient consented to use of Aripiprazole after we explored the risks and benefits.   Principal Problem: Bipolar I disorder, most recent episode depressed (HCC) Diagnosis:   Patient Active Problem List   Diagnosis Date Noted  . Bipolar I disorder, most recent episode depressed (HCC) [F31.30] 06/19/2016  . MDD (major depressive disorder) [F32.9] 06/18/2016  . Bipolar 1  disorder, manic, full remission (HCC) [F31.74] 06/18/2016  . Migraine [G43.909] 02/20/2015  . Affective psychosis, bipolar (HCC) [F31.9]   . Bipolar disorder, current episode depressed, severe, without psychotic features (HCC) [F31.4]   . Bipolar affective disorder, current episode severe (HCC) [F31.9] 12/02/2014  . Bipolar I disorder, current or most recent episode depressed, in partial remission (HCC) [F31.75] 07/11/2014  . Bipolar 1 disorder, mixed (HCC) [F31.60] 06/27/2014  . Disease of thyroid gland [E07.9] 06/27/2014  . Anxiety [F41.9] 06/27/2014  . 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 Bipolar 1 disorder, depressed, partial remission (HCC) [F31.75] 06/27/2014  . 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 Alcohol use disorder, severe, dependence (HCC) [F10.20] 06/24/2014  . Bipolar disorder, unspecified [F31.9] 02/16/2013  . Adrenal insufficiency (HCC) [E27.40]   . Acne [L70.9]   . Hx of Lyme disease [Z86.19]   . 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 Bipolar I disorder, most recent episode (or current) manic (HCC) [F31.10] 08/29/2012  . Hypothyroidism [E03.9] 12/23/2010  . Myalgia [M79.1] 12/23/2010  . Fatigue [R53.83] 12/23/2010  . Unspecified vitamin deficiency [E56.9] 12/23/2010  . Depression [F32.9]    Total Time spent with patient: 20 minutes  Past Psychiatric History: As in H&P  Past Medical History:  Past Medical History:  Diagnosis Date  . Acne   . Adrenal insufficiency (HCC)   . Bipolar 2 disorder (HCC)   . Cellulitis   . Depression   . Genital warts   . Headache   . History of chicken pox   . Hx of Lyme disease   . Hypothyroidism  Past Surgical History:  Procedure Laterality Date  . CESAREAN SECTION  2003, 2007, 2011  . INGUINAL HERNIA REPAIR Bilateral   . TONSILLECTOMY    . UMBILICAL HERNIA REPAIR     Family History:  Family History  Problem Relation Age of  Onset  . Other Mother     PAD  . Heart disease Other     Grandparent  . Alcohol abuse Paternal Grandmother   . Mental retardation Paternal Grandmother   . Arthritis Maternal Grandfather   . Heart disease Maternal Grandfather    Family Psychiatric  History: As in H&P  Social History:  History  Alcohol Use  . Yes    Comment: social drinker-few drinks a week      History  Drug Use No    Social History   Social History  . Marital status: Single    Spouse name: N/A  . Number of children: N/A  . Years of education: 27   Occupational History  . HOMEMAKER    Social History Main Topics  . Smoking status: Current Every Day Smoker    Packs/day: 1.50    Years: 29.00    Types: Cigarettes  . Smokeless tobacco: Never Used  . Alcohol use Yes     Comment: social drinker-few drinks a week   . Drug use: No  . Sexual activity: Not Currently   Other Topics Concern  . None   Social History Narrative   Regular exercise-no   Additional Social History:    Pain Medications: occ alcohol Prescriptions: none       Sleep: Good  Appetite:  Fair  Current Medications: Current Facility-Administered Medications  Medication Dose Route Frequency Provider Last Rate Last Dose  . hydrOXYzine (ATARAX/VISTARIL) tablet 25 mg  25 mg Oral Q6H PRN Craige Cotta, MD   25 mg at 06/20/16 1842  . ibuprofen (ADVIL,MOTRIN) tablet 600 mg  600 mg Oral Q6H PRN Shuvon B Rankin, NP   600 mg at 06/20/16 1637  . lamoTRIgine (LAMICTAL) tablet 25 mg  25 mg Oral Daily Shuvon B Rankin, NP   25 mg at 06/21/16 0828  . metroNIDAZOLE (FLAGYL) tablet 500 mg  500 mg Oral Q12H Adonis Brook, NP   500 mg at 06/21/16 9604  . nicotine (NICODERM CQ - dosed in mg/24 hours) patch 21 mg  21 mg Transdermal Daily Craige Cotta, MD   21 mg at 06/21/16 0829  . thyroid (ARMOUR) tablet 90 mg  90 mg Oral QAC breakfast Craige Cotta, MD   90 mg at 06/21/16 0615  . traZODone (DESYREL) tablet 75 mg  75 mg Oral QHS PRN  Adonis Brook, NP   75 mg at 06/20/16 2124  . venlafaxine XR (EFFEXOR-XR) 24 hr capsule 37.5 mg  37.5 mg Oral Q breakfast Shuvon B Rankin, NP   37.5 mg at 06/21/16 5409    Lab Results:  Results for orders placed or performed during the hospital encounter of 06/18/16 (from the past 48 hour(s))  TSH     Status: Abnormal   Collection Time: 06/19/16  6:28 PM  Result Value Ref Range   TSH <0.010 (L) 0.350 - 4.500 uIU/mL    Comment: Performed by a 3rd Generation assay with a functional sensitivity of <=0.01 uIU/mL. Performed at [facility], [address], [place], [place] [address]     Blood Alcohol level:  Lab Results  Component Value Date   [facility] <5 06/18/2016   ETH <5 12/01/2014  Metabolic Disorder Labs: Lab Results  Component Value Date   HGBA1C 5.1 12/03/2014   MPG 100 12/03/2014   MPG 100 12/02/2014   No results found for: PROLACTIN Lab Results  Component Value Date   CHOL 195 12/03/2014   TRIG 122 12/03/2014   HDL 42 12/03/2014   CHOLHDL 4.6 12/03/2014   VLDL 24 12/03/2014   LDLCALC 129 (H) 12/03/2014    Physical Findings: AIMS: Facial and Oral Movements Lips and Perioral Area: None, normal Jaw: None, normal Tongue: None, normal,Extremity Movements Upper (arms, wrists, hands, fingers): None, normal Lower (legs, knees, ankles, toes): None, normal, Trunk Movements Neck, shoulders, hips: None, normal, Overall Severity Severity of abnormal movements (highest score from questions above): None, normal Incapacitation due to abnormal movements: None, normal Patient's awareness of abnormal movements (rate only patient's report): No Awareness, Dental Status Current problems with teeth and/or dentures?: No Does patient usually wear dentures?: No  CIWA:  CIWA-Ar Total: 5 COWS:  COWS Total Score: 1  Musculoskeletal: Strength & Muscle Tone: within normal limits Gait & Station: normal Patient leans: N/A  Psychiatric Specialty Exam: Physical Exam   Constitutional: She is oriented to person, place, and time. She appears well-developed and well-nourished.  HENT:  Head: Normocephalic and atraumatic.  Eyes: Conjunctivae and EOM are normal. Pupils are equal, round, and reactive to light.  Neck: Normal range of motion. Neck supple.  Cardiovascular: Normal rate, regular rhythm and normal heart sounds.   Respiratory: Effort normal and breath sounds normal.  GI: Soft. Bowel sounds are normal.  Musculoskeletal: Normal range of motion.  Neurological: She is alert and oriented to person, place, and time. She has normal reflexes.  Skin: Skin is warm and dry.  Psychiatric:  As above    ROS  Blood pressure (!) 86/52, pulse (!) 58, temperature 97.4 F (36.3 C), temperature source Oral, resp. rate 16, height 5' 1.25" (1.556 m), weight 46.4 kg (102 lb 5 oz), SpO2 100 %.Body mass index is 19.17 kg/m.  General Appearance: Casually dressed, slightly withdrawn, psychomotor retardation. Good eye contact. Good rapport. Appropriate behavior.   Eye Contact:  Good  Speech:  Decreased rate and tone  Volume:  Decreased  Mood:  Depressed  Affect:  Blunted and mood congruent  Thought Process:  Linear  Orientation:  Full (Time, Place, and Person)  Thought Content:  Negative ruminations. No delusional theme. No preoccupation with violent thoughts.  No obsession.  No hallucination in any modality.   Suicidal Thoughts:  No  Homicidal Thoughts:  No  Memory:  Immediate;   Good Recent;   Fair Remote;   Fair  Judgement:  Fair  Insight:  Good  Psychomotor Activity:  Decreased  Concentration:  Concentration: Fair and Attention Span: Fair  Recall:  [employer] of Knowledge:  Good  Language:  Good  Akathisia:  No  Handed:    AIMS (if indicated):     Assets:  Communication Skills Desire for Improvement Financial Resources/Insurance Housing  ADL's:  Intact  Cognition:  WNL  Sleep:  Number of Hours: 6.5     Treatment Plan Summary: Patient is presenting  with bipolar depression. She had suicidal thoughts at presentation. She is tolerating medications well. We paln to adjust her medication further today.   Psychiatric: Bipolar I Disorder. 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 Increase Effexor XR to 75 mg daily 2. Add  Abilify 5 mg daily 3. Would gradually titrate Lamictal every fortnight.  4. Encourage unit groups and activities 5. Monitor mood, behavior and interaction with peers 6. SW would facilitate aftercare.  7. Weekly BMP   Georgiann Cocker, MD 06/21/2016, 12:11 PM

## 2016-06-21 NOTE — Progress Notes (Signed)
Recreation Therapy Notes  Animal-Assisted Activity (AAA) Program Checklist/Progress Notes Patient Eligibility Criteria Checklist & Daily Group note for Rec TxIntervention  Date: 04.10.2018 Time: 2:45pm Location: [address]   AAA/T Program Assumption of Risk Form signed by Patient/ or Parent Legal Guardian Yes  Patient is free of allergies or sever asthma Yes  Patient reports no fear of animals Yes  Patient reports no history of cruelty to animals Yes  Patient understands his/her participation is voluntary Yes  Patient washes hands before animal contact Yes  Patient washes hands after animal contact Yes  Behavioral Response: Appropriate   Education:Hand Washing, Appropriate Animal Interaction   Education Outcome: Acknowledges education.   Clinical Observations/Feedback: Patient attended session and interacted appropriately with therapy dog and peers.   Tina [name], LRT/CTRS        Elisabet Gutzmer L 06/21/2016 3:05 PM

## 2016-06-21 NOTE — Progress Notes (Signed)
D: Patient up and visible in the milieu. Spoke with patient 1:1. Rating depression at an 8/10, hopelessness at an 8/10 and anxiety at a 6/10. Rates sleep as good, appetite as fair, energy as low and concentration as poor. Patient's affect flat, mood depressed. States goal for today is to "feel better." Denies pain however reports light headedness with position changes. BP remains hypotensive - 86/52 manually.  A: Medicated per orders, no prns given or requested. Emotional support offered and self inventory reviewed. Encouraged completion of Suicide Safety Plan. Discussed POC with MD, SW.  Fall p/c in place and reviewed with patient. Pitcher of ice water provided and encouraged.   R: Patient verbalizes understanding of POC, falls teaching. On reassess, patient's manual BP after consuming pitcher of water is 102/64 which patient states is about at her baseline. States she will continue to push fluids. Patient denies SI/HI and remains safe on level III obs. Will continue to monitor closely.

## 2016-06-21 NOTE — Progress Notes (Signed)
Adult Psychoeducational Group Note  Date:  06/21/2016 Time:  2:08 AM  Group Topic/Focus:  Wrap-Up Group:   The focus of this group is to help patients review their daily goal of treatment and discuss progress on daily workbooks.  Participation Level:  Active  Participation Quality:  Attentive  Affect:  Appropriate  Cognitive:  Appropriate  Insight: Appropriate  Engagement in Group:  Developing/Improving  Modes of Intervention:  Discussion  Additional Comments:  Pt stated she had no goal today. Pt stated she wants to get her medications stable. Writer asked patient is there anything she wanted to work, stated "no" just medications.   Karleen Hampshire Brittini 06/21/2016, 2:08 AM

## 2016-06-22 MED ORDER — BISACODYL 10 MG RE SUPP
10.0000 mg | Freq: Once | RECTAL | Status: DC
  Filled 2016-06-22 (×2): qty 1

## 2016-06-22 MED ORDER — HYDROXYZINE HCL 50 MG PO TABS
50.0000 mg | ORAL_TABLET | Freq: Every evening | ORAL | Status: DC | PRN
Start: 2016-06-22 — End: 2016-06-26
  Administered 2016-06-22: 50 mg via ORAL
  Filled 2016-06-22: qty 1

## 2016-06-22 MED ORDER — MIDODRINE HCL 5 MG PO TABS
5.0000 mg | ORAL_TABLET | Freq: Two times a day (BID) | ORAL | Status: DC
Start: 2016-06-22 — End: 2016-06-26
  Administered 2016-06-22 – 2016-06-26 (×9): 5 mg via ORAL
  Filled 2016-06-22 (×13): qty 1

## 2016-06-22 NOTE — Progress Notes (Signed)
Nursing Progress Note: 7p-7a D: Pt currently presents with a anxious/pleasant affect and behavior. Pt states "Today was really a lot better than yesterday." Interacting appropriately with milieu. Pt reports good sleep with current medication regimen.   A: Pt provided with medications per providers orders. Pt's labs and vitals were monitored throughout the night. Pt supported emotionally and encouraged to express concerns and questions. Pt educated on medications.  R: Pt's safety ensured with 15 minute and environmental checks. Pt currently denies SI/HI/Self Harm and AVH. Pt verbally contracts to seek staff if SI/HI or A/VH occurs and to consult with staff before acting on any harmful thoughts. Will continue to monitor.

## 2016-06-22 NOTE — Progress Notes (Signed)
[facility] MD Progress Note  06/22/2016 2:34 PM Tina [name]  MRN:  [id number] Subjective:  46 yo Caucasian female, divorced, lives alone. Background history of Bipolar disorder. Patient has been off her psychotropic medications since August last year. She reports mood swings with periods of elation. She presented in company of her family because she has been expressing thoughts of suicide. She reports being depressed lately.   Seen today. Chart reviewed prior to interview Says she is slightly less depressed today. She did not sleep well last night. She has been taken off Trazodone as her blood pressure is running low. No dizziness or falls. Says she has chronically had low blood pressure. She was started on Midodrine today. Patient says she has not been attending groups because she has little motivation. No thoughts of suicide lately. No evidence of psychosis.   Nursing staff reports that she is withdrawn. Minimal engagement with peers or at groups. She has not expressed any suicidal thoughts. She is tolerating her medications well.    Principal Problem: Bipolar I disorder, most recent episode depressed ([place]) Diagnosis:   Patient Active Problem List   Diagnosis Date Noted  . Bipolar I disorder, most recent episode depressed ([place]) [F31.30] 06/19/2016  . MDD (major depressive disorder) [F32.9] 06/18/2016  . Bipolar 1 disorder, manic, full remission ([place]) [F31.74] 06/18/2016  . Migraine [G43.909] 02/20/2015  . Affective psychosis, bipolar ([place]) [F31.9]   . Bipolar disorder, current episode depressed, severe, without psychotic features ([place]) [F31.4]   . Bipolar affective disorder, current episode severe ([place]) [F31.9] 12/02/2014  . Bipolar I disorder, current or most recent episode depressed, in partial remission ([place]) [F31.75] 07/11/2014  . Bipolar 1 disorder, mixed ([place]) [F31.60] 06/27/2014  . Disease of thyroid gland [E07.9] 06/27/2014  . Anxiety [F41.9] 06/27/2014  . Opiate misuse  [F11.90] 06/27/2014  . Cannabis dependence without physiological dependence ([place]) [F12.20] 06/27/2014  . Bipolar 1 disorder, depressed, partial remission ([place]) [F31.75] 06/27/2014  . 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 Alcohol use disorder, severe, dependence ([place]) [F10.20] 06/24/2014  . Bipolar disorder, unspecified [F31.9] 02/16/2013  . Adrenal insufficiency ([place]) [E27.40]   . Acne [L70.9]   . Hx of Lyme disease [Z86.19]   . 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 Bipolar I disorder, most recent episode (or current) manic ([place]) [F31.10] 08/29/2012  . Hypothyroidism [E03.9] 12/23/2010  . Myalgia [M79.1] 12/23/2010  . Fatigue [R53.83] 12/23/2010  . Unspecified vitamin deficiency [E56.9] 12/23/2010  . Depression [F32.9]    Total Time spent with patient: 20 minutes  Past Psychiatric History: As in H&P  Past Medical History:  Past Medical History:  Diagnosis Date  . Acne   . Adrenal insufficiency ([place])   . Bipolar 2 disorder ([place])   . Cellulitis   . Depression   . Genital warts   . Headache   . History of chicken pox   . Hx of Lyme disease   . Hypothyroidism     Past Surgical History:  Procedure Laterality Date  . CESAREAN SECTION  2003, 2007, 2011  . INGUINAL HERNIA REPAIR Bilateral   . TONSILLECTOMY    . UMBILICAL HERNIA REPAIR     Family History:  Family History  Problem Relation Age of Onset  . Other Mother     PAD  . Heart disease Other     Grandparent  . Alcohol abuse Paternal Grandmother   . Mental retardation Paternal Grandmother   . Arthritis Maternal Grandfather   . Heart disease Maternal Grandfather  Family Psychiatric  History: As in H&P  Social History:  History  Alcohol Use  . Yes    Comment: social drinker-few drinks a week      History  Drug Use No    Social History   Social History  . Marital status: Single    Spouse name: N/A  . Number of children: N/A  . Years of education: 71   Occupational History  . HOMEMAKER     Social History Main Topics  . Smoking status: Current Every Day Smoker    Packs/day: 1.50    Years: 29.00    Types: Cigarettes  . Smokeless tobacco: Never Used  . Alcohol use Yes     Comment: social drinker-few drinks a week   . Drug use: No  . Sexual activity: Not Currently   Other Topics Concern  . None   Social History Narrative   Regular exercise-no   Additional Social History:    Pain Medications: occ alcohol Prescriptions: none       Sleep: Broken last night  Appetite:  Fair  Current Medications: Current Facility-Administered Medications  Medication Dose Route Frequency Provider Last Rate Last Dose  . ARIPiprazole (ABILIFY) tablet 5 mg  5 mg Oral Daily Artist Beach, MD   5 mg at 06/22/16 0823  . bisacodyl (DULCOLAX) suppository 10 mg  10 mg Rectal Once Encarnacion Slates, NP   Stopped at 06/22/16 0900  . hydrOXYzine (ATARAX/VISTARIL) tablet 25 mg  25 mg Oral Q6H PRN Jenne Campus, MD   25 mg at 06/21/16 1452  . hydrOXYzine (ATARAX/VISTARIL) tablet 50 mg  50 mg Oral QHS PRN Encarnacion Slates, NP      . ibuprofen (ADVIL,MOTRIN) tablet 600 mg  600 mg Oral Q6H PRN Shuvon B Rankin, NP   600 mg at 06/20/16 1637  . lamoTRIgine (LAMICTAL) tablet 25 mg  25 mg Oral Daily Shuvon B Rankin, NP   25 mg at 06/22/16 0823  . magnesium hydroxide (MILK OF MAGNESIA) suspension 30 mL  30 mL Oral Daily PRN Encarnacion Slates, NP   30 mL at 06/21/16 1643  . metroNIDAZOLE (FLAGYL) tablet 500 mg  500 mg Oral Q12H Kerrie Buffalo, NP   500 mg at 06/22/16 0823  . midodrine (PROAMATINE) tablet 5 mg  5 mg Oral BID WC Kerrie Buffalo, NP   5 mg at 06/22/16 1310  . nicotine (NICODERM CQ - dosed in mg/24 hours) patch 21 mg  21 mg Transdermal Daily Jenne Campus, MD   21 mg at 06/22/16 0825  . thyroid (ARMOUR) tablet 90 mg  90 mg Oral QAC breakfast Jenne Campus, MD   90 mg at 06/22/16 2683  . venlafaxine XR (EFFEXOR-XR) 24 hr capsule 75 mg  75 mg Oral Q breakfast Artist Beach, MD   75 mg at  06/22/16 4196    Lab Results:  Results for orders placed or performed during the hospital encounter of 06/18/16 (from the past 48 hour(s))  Basic metabolic panel     Status: Abnormal   Collection Time: 06/21/16  6:19 PM  Result Value Ref Range   Sodium 137 135 - 145 mmol/L   Potassium 3.8 3.5 - 5.1 mmol/L   Chloride 101 101 - 111 mmol/L   CO2 32 22 - 32 mmol/L   Glucose, Bld 111 (H) 65 - 99 mg/dL   BUN 12 6 - 20 mg/dL   Creatinine, Ser 0.66 0.44 - 1.00 mg/dL   Calcium 8.8 (  L) 8.9 - 10.3 mg/dL   GFR calc non Af Amer >60 >60 mL/min   GFR calc Af Amer >60 >60 mL/min    Comment: (NOTE) The eGFR has been calculated using the CKD EPI equation. This calculation has not been validated in all clinical situations. eGFR's persistently <60 mL/min signify possible Chronic Kidney Disease.    Anion gap 4 (L) 5 - 15    Comment: Performed at [facility], [place] [address] [place], [place] [address]    Blood Alcohol level:  Lab Results  Component Value Date   [facility] <5 06/18/2016   ETH <5 60/45/4098    Metabolic Disorder Labs: Lab Results  Component Value Date   HGBA1C 5.1 12/03/2014   MPG 100 12/03/2014   MPG 100 12/02/2014   No results found for: PROLACTIN Lab Results  Component Value Date   CHOL 195 12/03/2014   TRIG 122 12/03/2014   HDL 42 12/03/2014   CHOLHDL 4.6 12/03/2014   VLDL 24 12/03/2014   LDLCALC 129 (H) 12/03/2014    Physical Findings: AIMS: Facial and Oral Movements Lips and Perioral Area: None, normal Jaw: None, normal Tongue: None, normal,Extremity Movements Upper (arms, wrists, hands, fingers): None, normal Lower (legs, knees, ankles, toes): None, normal, Trunk Movements Neck, shoulders, hips: None, normal, Overall Severity Severity of abnormal movements (highest score from questions above): None, normal Incapacitation due to abnormal movements: None, normal Patient's awareness of abnormal movements (rate only patient's report): No  Awareness, Dental Status Current problems with teeth and/or dentures?: No Does patient usually wear dentures?: No  CIWA:  CIWA-Ar Total: 5 COWS:  COWS Total Score: 1  Musculoskeletal: Strength & Muscle Tone: within normal limits Gait & Station: normal Patient leans: N/A  Psychiatric Specialty Exam: Physical Exam  Constitutional: She is oriented to person, place, and time. She appears well-developed and well-nourished.  HENT:  Head: Normocephalic and atraumatic.  Eyes: Conjunctivae and EOM are normal. Pupils are equal, round, and reactive to light.  Neck: Normal range of motion. Neck supple.  Cardiovascular: Normal rate, regular rhythm and normal heart sounds.   Respiratory: Effort normal and breath sounds normal.  GI: Soft. Bowel sounds are normal.  Musculoskeletal: Normal range of motion.  Neurological: She is alert and oriented to person, place, and time. She has normal reflexes.  Skin: Skin is warm and dry.  Psychiatric:  As above    ROS  Blood pressure (!) 80/42, pulse 62, temperature 98.4 F (36.9 C), temperature source Oral, resp. rate 20, height 5' 1.25" (1.556 m), weight 46.4 kg (102 lb 5 oz), SpO2 100 %.Body mass index is 19.17 kg/m.  General Appearance: Neatly dressed, slightly withdrawn, psychomotor retardation. Good eye contact. Good rapport. Appropriate behavior.   Eye Contact:  Good  Speech:  Decreased rate and tone  Volume:  Decreased  Mood:  Depressed  Affect:  Blunted and mood congruent  Thought Process:  Linear  Orientation:  Full (Time, Place, and Person)  Thought Content:  Negative ruminations. No delusional theme. No preoccupation with violent thoughts.  No obsession.  No hallucination in any modality.   Suicidal Thoughts:  No  Homicidal Thoughts:  No  Memory:  Immediate;   Good Recent;   Fair Remote;   Fair  Judgement:  Fair  Insight:  Good  Psychomotor Activity:  Decreased  Concentration:  Concentration: Fair and Attention Span: Fair  Recall:   [employer] of Knowledge:  Good  Language:  Good  Akathisia:  No  Handed:  AIMS (if indicated):     Assets:  Communication Skills Desire for Improvement Financial Resources/Insurance Housing  ADL's:  Intact  Cognition:  WNL  Sleep:  Number of Hours: 6.5     Treatment Plan Summary: Patient is still depressed. She is tolerating recent medication adjustment well. We are still evaluating her.    Psychiatric: Bipolar I Disorder. Current episode is depression  Medical: Hypothyroidism Lyme's disease Chronic fatigue syndrome Adrenal insufficiency   Psychosocial:  Recent job loss Museum/gallery exhibitions officer   PLAN: 1. Continue current regimen 2. Continue to monitor mood, behavior and interaction with peers 3. Continue to encourage unit groups and activities   Artist Beach, MD 06/22/2016, 2:34 PMPatient ID: Tina [name], female   DOB: 27-Apr-1970, 46 y.o.   MRN: [id number]

## 2016-06-22 NOTE — [facility] Group Notes (Signed)
[facility] LCSW Group Therapy  06/22/2016 10:50 AM  Type of Therapy:  Group Therapy  Participation Level:  Did Not Attend-pt invited. Chose to rest in bed.   Summary of Progress/Problems: Emotion Regulation: This group focused on both positive and negative emotion identification and allowed group members to process ways to identify feelings, regulate negative emotions, and find healthy ways to manage internal/external emotions. Group members were asked to reflect on a time when their reaction to an emotion led to a negative outcome and explored how alternative responses using emotion regulation would have benefited them. Group members were also asked to discuss a time when emotion regulation was utilized when a negative emotion was experienced.   Benjimin Hadden N Smart LCSW 06/22/2016, 10:50 AM

## 2016-06-22 NOTE — Progress Notes (Addendum)
D: Patient very weak and dizzy this morning, even upon sitting up in bed. Patient has been consuming fluids however states she feels like this even at home. Tends to feel better as the day goes on as evidenced by her ability to be up in dayroom.  Rating depression at an 8/10, hopelessness at an 8/10 and anxiety at a 6/10. Rates sleep as poor, appetite as fair, energy as low and concentration as poor. Patient's affect flat, depressed and sad with congruent mood. States goal for today is to "feel better." Denies pain. Patient had been complaining of constipation for which she was medicated yesterday however had several bowel movements this morning and discomfort has resolved.  A: NP made aware of patient's status throughout the day. Order received for midodrine BID (with food) which was initiated at lunch time. Other regular meds administered per orders. Vistaril given prn.  Emotional support offered and self inventory reviewed. Encouraged completion of Suicide Safety Plan. Discussed POC with MD, SW.  Fall p/c in place and continually reviewed.   R: Patient verbalizes understanding of POC. On reassess, patient calmer from vistaril. Will continue to monitor VS. Patient denies SI/HI and remains safe on level III obs.

## 2016-06-22 NOTE — Progress Notes (Signed)
Recreation Therapy Notes  Date: 06/22/16 Time: 0930 Location: [address] Dayroom  Group Topic: Stress Management  Goal Area(s) Addresses:  Patient will verbalize importance of using healthy stress management.  Patient will identify positive emotions associated with healthy stress management.   Intervention: Stress Management  Activity :  Body Scan Meditation.  LRT introduced the stress management technique of meditation.  LRT played a meditation from the Calm app that focused on body scan to examine the feelings and tension within the body.  Patients were to follow along with the meditation to played to engage in the technique.  Education:  Stress Management, Discharge Planning.   Education Outcome: Acknowledges edcuation/In group clarification offered/Needs additional education  Clinical Observations/Feedback: Pt did not attend group.   Caroll Rancher, LRT/CTRS         Caroll Rancher A 06/22/2016 11:24 AM

## 2016-06-23 MED ORDER — LORAZEPAM 0.5 MG PO TABS
0.5000 mg | ORAL_TABLET | Freq: Three times a day (TID) | ORAL | Status: AC | PRN
Start: 2016-06-23 — End: 2016-06-26
  Administered 2016-06-24 – 2016-06-25 (×2): 0.5 mg via ORAL
  Filled 2016-06-23 (×2): qty 1

## 2016-06-23 MED ORDER — TRAZODONE HCL 50 MG PO TABS
75.0000 mg | ORAL_TABLET | Freq: Every day | ORAL | Status: DC
  Administered 2016-06-23 – 2016-06-25 (×3): 75 mg via ORAL
  Filled 2016-06-23 (×7): qty 1

## 2016-06-23 NOTE — Progress Notes (Signed)
Pt attend wrap up group. Her day was a 7. Her goal work on blood pressure to get  Medicine regular. She have a hard time getting up in the mornings because her blood pressure so low.

## 2016-06-23 NOTE — [facility] Group Notes (Signed)
[facility] LCSW Group Therapy  06/23/2016 12:02 PM  Type of Therapy:  Group Therapy  Participation Level:  Active  Participation Quality:  Attentive  Affect:  Appropriate  Cognitive:  Alert and Oriented  Insight:  Improving  Engagement in Therapy:  Improving  Modes of Intervention:  Confrontation, Discussion, Education, Problem-solving, Socialization and Support  Summary of Progress/Problems: Self Sabotage: Group Members were asked to identify the meaning of self sabotage and how this applies in their lives. Group members were asked to explore what types of self sabotage are getting in their way and processed plans for change in order to address these obstacles.   Tina Tino N Smart LCSW 06/23/2016, 12:02 PM

## 2016-06-23 NOTE — Progress Notes (Signed)
Patient ID: Tina [name], female   DOB: [birth year], 46 y.o.   MRN: [id number]   D   ---   Pt agrees to contract for safety and denies pain.  Pt stated that she is not feeling well this AM and has remained in her room in bed.    She appears friendly and polite  With staff and shows no anger or irritability.  Pt declined NIC patch this AM saying  " I'll waite tell later when I feel better ".   Pt shows good judgement .     Pt takes medications as asked and shows no sign of adverse effects.  Pt said that she has a HX of low B/P and sometimes feels  weak/ dizzy.  Her B/P this AM was higher that on previous  Days and was taken manually.   --- A ---  Provide support and safety  ---  R ---  Pt remains safe on unit

## 2016-06-23 NOTE — Progress Notes (Signed)
[facility] MD Progress Note  06/23/2016 2:36 PM Tina [name]  MRN:  [id number] Subjective:  46 yo Caucasian female, divorced, lives alone. Background history of Bipolar disorder. Patient has been off her psychotropic medications since August last year. She reports mood swings with periods of elation. She presented in company of her family because she has been expressing thoughts of suicide. She reports being depressed lately.   Seen today. Chart reviewed prior to interview Says her mood is lifting. Rates depression at 4/10 today. No associated suicidal thoughts. Says she did not sleep well last night. Her mind raced a bit last night. Patient notes that Trazodone has helped over the years. She has been tired today and felt anxious. She was given hydroxyzine PRN. Feels foggy with it. Still has low blood pressure. Felt dizzy on getting up today. No falls.  No associated irritability. No grandiosity or any other [name] of delusion. Patient hopes she can go home this weekend. She plans to stay with her mom for a while.   Nursing staff reports that she is more interactive and more pleasant. She has been participating with unit groups and activities. She has not expressed any futility thoughts. Blood pressure is marginally better.     Principal Problem: Bipolar I disorder, most recent episode depressed ([place]) Diagnosis:   Patient Active Problem List   Diagnosis Date Noted  . Bipolar I disorder, most recent episode depressed ([place]) [F31.30] 06/19/2016  . MDD (major depressive disorder) [F32.9] 06/18/2016  . Bipolar 1 disorder, manic, full remission ([place]) [F31.74] 06/18/2016  . Migraine [G43.909] 02/20/2015  . Affective psychosis, bipolar ([place]) [F31.9]   . Bipolar disorder, current episode depressed, severe, without psychotic features ([place]) [F31.4]   . Bipolar affective disorder, current episode severe ([place]) [F31.9] 12/02/2014  . Bipolar I disorder, current or most recent episode depressed, in partial  remission ([place]) [F31.75] 07/11/2014  . Bipolar 1 disorder, mixed ([place]) [F31.60] 06/27/2014  . Disease of thyroid gland [E07.9] 06/27/2014  . Anxiety [F41.9] 06/27/2014  . 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 Cannabis dependence without physiological dependence ([place]) [F12.20] 06/27/2014  . Bipolar 1 disorder, depressed, partial remission ([place]) [F31.75] 06/27/2014  . 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 Alcohol use disorder, severe, dependence ([place]) [F10.20] 06/24/2014  . Bipolar disorder, unspecified [F31.9] 02/16/2013  . Adrenal insufficiency ([place]) [E27.40]   . Acne [L70.9]   . Hx of Lyme disease [Z86.19]   . 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 Bipolar I disorder, most recent episode (or current) manic ([place]) [F31.10] 08/29/2012  . Hypothyroidism [E03.9] 12/23/2010  . Myalgia [M79.1] 12/23/2010  . Fatigue [R53.83] 12/23/2010  . Unspecified vitamin deficiency [E56.9] 12/23/2010  . Depression [F32.9]    Total Time spent with patient: 20 minutes  Past Psychiatric History: As in H&P  Past Medical History:  Past Medical History:  Diagnosis Date  . Acne   . Adrenal insufficiency ([place])   . Bipolar 2 disorder ([place])   . Cellulitis   . Depression   . Genital warts   . Headache   . History of chicken pox   . Hx of Lyme disease   . Hypothyroidism     Past Surgical History:  Procedure Laterality Date  . CESAREAN SECTION  2003, 2007, 2011  . INGUINAL HERNIA REPAIR Bilateral   . TONSILLECTOMY    . UMBILICAL HERNIA REPAIR     Family History:  Family History  Problem Relation Age of Onset  . Other Mother     PAD  .  Heart disease Other     Grandparent  . Alcohol abuse Paternal Grandmother   . Mental retardation Paternal Grandmother   . Arthritis Maternal Grandfather   . Heart disease Maternal Grandfather    Family Psychiatric  History: As in H&P  Social History:  History  Alcohol Use  . Yes    Comment: social drinker-few drinks a week      History  Drug  Use No    Social History   Social History  . Marital status: Single    Spouse name: N/A  . Number of children: N/A  . Years of education: 16   Occupational History  . HOMEMAKER    Social History Main Topics  . Smoking status: Current Every Day Smoker    Packs/day: 1.50    Years: 29.00    Types: Cigarettes  . Smokeless tobacco: Never Used  . Alcohol use Yes     Comment: social drinker-few drinks a week   . Drug use: No  . Sexual activity: Not Currently   Other Topics Concern  . None   Social History Narrative   Regular exercise-no   Additional Social History:    Pain Medications: occ alcohol Prescriptions: none       Sleep: Broken last night  Appetite:  Good  Current Medications: Current Facility-Administered Medications  Medication Dose Route Frequency Provider Last Rate Last Dose  . ARIPiprazole (ABILIFY) tablet 5 mg  5 mg Oral Daily  A , MD   5 mg at 06/23/16 0800  . bisacodyl (DULCOLAX) suppository 10 mg  10 mg Rectal Once Agnes I Nwoko, NP   Stopped at 06/22/16 0900  . hydrOXYzine (ATARAX/VISTARIL) tablet 50 mg  50 mg Oral QHS PRN Agnes I Nwoko, NP   50 mg at 06/22/16 2119  . ibuprofen (ADVIL,MOTRIN) tablet 600 mg  600 mg Oral Q6H PRN Shuvon B Rankin, NP   600 mg at 06/20/16 1637  . lamoTRIgine (LAMICTAL) tablet 25 mg  25 mg Oral Daily Shuvon B Rankin, NP   25 mg at 06/23/16 0800  . LORazepam (ATIVAN) tablet 0.5 mg  0.5 mg Oral Q8H PRN  A , MD      . magnesium hydroxide (MILK OF MAGNESIA) suspension 30 mL  30 mL Oral Daily PRN Agnes I Nwoko, NP   30 mL at 06/21/16 1643  . metroNIDAZOLE (FLAGYL) tablet 500 mg  500 mg Oral Q12H Sheila Agustin, NP   500 mg at 06/23/16 0800  . midodrine (PROAMATINE) tablet 5 mg  5 mg Oral BID WC Sheila Agustin, NP   5 mg at 06/23/16 0800  . nicotine (NICODERM CQ - dosed in mg/24 hours) patch 21 mg  21 mg Transdermal Daily Fernando A Cobos, MD   21 mg at 06/23/16 1405  . thyroid (ARMOUR) tablet 90 mg   90 mg Oral QAC breakfast Fernando A Cobos, MD   90 mg at 06/23/16 0613  . traZODone (DESYREL) tablet 75 mg  75 mg Oral QHS  A , MD      . venlafaxine XR (EFFEXOR-XR) 24 hr capsule 75 mg  75 mg Oral Q breakfast  A , MD   75 mg at 06/23/16 0800    Lab Results:  Results for orders placed or performed during the hospital encounter of 06/18/16 (from the past 48 hour(s))  Basic metabolic panel     Status: Abnormal   Collection Time: 06/21/16  6:19 PM  Result Value Ref Range   Sodium 137 135 -   145 mmol/L   Potassium 3.8 3.5 - 5.1 mmol/L   Chloride 101 101 - 111 mmol/L   CO2 32 22 - 32 mmol/L   Glucose, Bld 111 (H) 65 - 99 mg/dL   BUN 12 6 - 20 mg/dL   Creatinine, Ser 0.66 0.44 - 1.00 mg/dL   Calcium 8.8 (L) 8.9 - 10.3 mg/dL   GFR calc non Af Amer >60 >60 mL/min   GFR calc Af Amer >60 >60 mL/min    Comment: (NOTE) The eGFR has been calculated using the CKD EPI equation. This calculation has not been validated in all clinical situations. eGFR's persistently <60 mL/min signify possible Chronic Kidney Disease.    Anion gap 4 (L) 5 - 15    Comment: Performed at [facility], [place] [address], [place], [place] [address]    Blood Alcohol level:  Lab Results  Component Value Date   [facility] <5 06/18/2016   ETH <5 15/72/6203    Metabolic Disorder Labs: Lab Results  Component Value Date   HGBA1C 5.1 12/03/2014   MPG 100 12/03/2014   MPG 100 12/02/2014   No results found for: PROLACTIN Lab Results  Component Value Date   CHOL 195 12/03/2014   TRIG 122 12/03/2014   HDL 42 12/03/2014   CHOLHDL 4.6 12/03/2014   VLDL 24 12/03/2014   LDLCALC 129 (H) 12/03/2014    Physical Findings: AIMS: Facial and Oral Movements Muscles of Facial Expression: None, normal Lips and Perioral Area: None, normal Jaw: None, normal Tongue: None, normal,Extremity Movements Upper (arms, wrists, hands, fingers): None, normal Lower (legs, knees, ankles, toes):  None, normal, Trunk Movements Neck, shoulders, hips: None, normal, Overall Severity Severity of abnormal movements (highest score from questions above): None, normal Incapacitation due to abnormal movements: None, normal Patient's awareness of abnormal movements (rate only patient's report): No Awareness, Dental Status Current problems with teeth and/or dentures?: No Does patient usually wear dentures?: No  CIWA:  CIWA-Ar Total: 5 COWS:  COWS Total Score: 1  Musculoskeletal: Strength & Muscle Tone: within normal limits Gait & Station: normal Patient leans: N/A  Psychiatric Specialty Exam: Physical Exam  Constitutional: She is oriented to person, place, and time. She appears well-developed and well-nourished.  HENT:  Head: Normocephalic and atraumatic.  Eyes: Conjunctivae and EOM are normal. Pupils are equal, round, and reactive to light.  Neck: Normal range of motion. Neck supple.  Cardiovascular: Normal rate, regular rhythm and normal heart sounds.   Respiratory: Effort normal and breath sounds normal.  GI: Soft. Bowel sounds are normal.  Musculoskeletal: Normal range of motion.  Neurological: She is alert and oriented to person, place, and time. She has normal reflexes.  Skin: Skin is warm and dry.  Psychiatric:  As above    ROS  Blood pressure (!) 92/51, pulse 62, temperature 98.4 F (36.9 C), temperature source Oral, resp. rate 20, height 5' 1.25" (1.556 m), weight 46.4 kg (102 lb 5 oz), SpO2 100 %.Body mass index is 19.17 kg/m.  General Appearance: Neatly dressed, more interactive. Psychomotor activity is better. Appropriate and engages well. Not internally distracted.   Eye Contact:  Good  Speech:  Spontaneous, low tone  Volume:  Soft spoken   Mood:  Less Depressed  Affect:  Restricted but appropriate   Thought Process:  Linear  Orientation:  Full (Time, Place, and Person)  Thought Content:  No negative ruminations. No delusional theme. No preoccupation with violent  thoughts.  No obsession.  No hallucination in any modality.  Suicidal Thoughts:  No  Homicidal Thoughts:  No  Memory:  Good  Judgement:  Good  Insight:  Good  Psychomotor Activity:  Much better  Concentration:  Good  Recall:  Good  Fund of Knowledge:  Good  Language:  Good  Akathisia:  No  Handed:    AIMS (if indicated):     Assets:  Communication Skills Desire for Improvement Financial Resources/Insurance Housing  ADL's:  Intact  Cognition:  WNL  Sleep:  Number of Hours: 6.5     Treatment Plan Summary: Depression is lifting. Sleep remains an issue. We plan to reintroduce Trazodone as he has benefited from it in the past.   Psychiatric: Bipolar I Disorder. Current episode is depression  Medical: Hypothyroidism Lyme's disease Chronic fatigue syndrome Adrenal insufficiency   Psychosocial:  Recent job loss Museum/gallery exhibitions officer   PLAN: 1. Trazodone 75 mg HS 2. Ativan 0.5 mg PRN Q8H 3. Continue to encourage unit groups and activities 4. Continue to monitor mood, behavior and interaction with peers. 5. Hopeful discharge by Sunday.    [place], MD 06/23/2016, 2:36 PMPatient ID: Tina [name], female   DOB: 1971-01-15, 46 y.o.   MRN: [id number] Patient ID: Tina [name], female   DOB: [birth year], 46 y.o.   MRN: [id number]

## 2016-06-23 NOTE — Progress Notes (Signed)
Patient ID: Tina [name], female   DOB: [birth year], 46 y.o.   MRN: [id number] D   ---  Writer spoke with mother of pat.  She is concerned about the trend of LOW B/P  exhibited by the pt.   Mother said pt B/P is usually  100/70 or 80.    Mother asking about possible medications for this issue.

## 2016-06-24 DIAGNOSIS — I959 Hypotension, unspecified: Secondary | ICD-10-CM

## 2016-06-24 MED ORDER — ARIPIPRAZOLE 2 MG PO TABS
2.0000 mg | ORAL_TABLET | Freq: Every day | ORAL | Status: DC
  Filled 2016-06-24: qty 1

## 2016-06-24 NOTE — Tx Team (Signed)
Interdisciplinary Treatment and Diagnostic Plan Update  06/24/2016 Time of Session: 0930 Tina [name] MRN: [id number]  Principal Diagnosis: Bipolar I disorder, most recent episode depressed (HCC)  Secondary Diagnoses: Principal Problem:   Bipolar I disorder, most recent episode depressed ([facility]) Active Problems:   Bipolar 1 disorder, manic, full remission (H[facility])   Current Medications:  Current Facility-Administered Medications  Medication Dose Route Frequency Provider Last Rate Last Dose  . ARIPiprazole (ABILIFY) tablet 5 mg  5 mg Oral Daily Georgiann Cocker, MD   5 mg at 06/23/16 0800  . bisacodyl (DULCOLAX) suppository 10 mg  10 mg Rectal Once Sanjuana Kava, NP   Stopped at 06/22/16 0900  . hydrOXYzine (ATARAX/VISTARIL) tablet 50 mg  50 mg Oral QHS PRN Sanjuana Kava, NP   50 mg at 06/22/16 2119  . ibuprofen (ADVIL,MOTRIN) tablet 600 mg  600 mg Oral Q6H PRN Shuvon B Rankin, NP   600 mg at 06/20/16 1637  . lamoTRIgine (LAMICTAL) tablet 25 mg  25 mg Oral Daily Shuvon B Rankin, NP   25 mg at 06/24/16 1017  . LORazepam (ATIVAN) tablet 0.5 mg  0.5 mg Oral Q8H PRN Georgiann Cocker, MD      . magnesium hydroxide (MILK OF MAGNESIA) suspension 30 mL  30 mL Oral Daily PRN Sanjuana Kava, NP   30 mL at 06/21/16 1643  . metroNIDAZOLE (FLAGYL) tablet 500 mg  500 mg Oral Q12H Adonis Brook, NP   500 mg at 06/24/16 1016  . midodrine (PROAMATINE) tablet 5 mg  5 mg Oral BID WC Adonis Brook, NP   5 mg at 06/24/16 1018  . nicotine (NICODERM CQ - dosed in mg/24 hours) patch 21 mg  21 mg Transdermal Daily Rockey Situ Cobos, MD   21 mg at 06/24/16 0800  . thyroid (ARMOUR) tablet 90 mg  90 mg Oral QAC breakfast Craige Cotta, MD   90 mg at 06/23/16 8295  . traZODone (DESYREL) tablet 75 mg  75 mg Oral QHS Georgiann Cocker, MD   75 mg at 06/23/16 2122  . venlafaxine XR (EFFEXOR-XR) 24 hr capsule 75 mg  75 mg Oral Q breakfast Georgiann Cocker, MD   75 mg at 06/24/16 1018   PTA  Medications: Prescriptions Prior to Admission  Medication Sig Dispense Refill Last Dose  . ARMOUR THYROID 90 MG tablet   4 06/17/2016 at Unknown time  . amphetamine-dextroamphetamine (ADDERALL) 10 MG tablet 1  Bid  Fill after 09/12/2015 60 tablet 0 Unknown at Unknown time  . Ascorbic Acid (VITAMIN C) 100 MG tablet Take 100 mg by mouth daily.   Unknown at Unknown time  . aspirin-acetaminophen-caffeine (EXCEDRIN MIGRAINE) 250-250-65 MG tablet Take 1 tablet by mouth every 6 (six) hours as needed for headache.   Unknown at Unknown time  . B Complex Vitamins (VITAMIN B COMPLEX 100) INJ Inject 1,000 mg as directed See admin instructions. Every 2 weeks   Unknown at Unknown time  . cholecalciferol (VITAMIN D) 1000 units tablet Take 1,000 Units by mouth daily.   Unknown at Unknown time  . clindamycin (CLEOCIN T) 1 % external solution Apply topically 2 (two) times daily. (Patient not taking: Reported on 06/18/2016) 30 mL 3 Unknown at Unknown time  . cyanocobalamin (,VITAMIN B-12,) 1000 MCG/ML injection   4 Unknown at Unknown time  . Hydroxocobalamin 1000 MCG/ML SOLN Inject 1 mL into the skin See admin instructions.  1 Unknown at Unknown time  . lamoTRIgine (LAMICTAL) 200 MG  tablet Take 1 tablet (200 mg total) by mouth daily. (Patient not taking: Reported on 06/18/2016) 30 tablet 6 Unknown at Unknown time  . liothyronine (CYTOMEL) 5 MCG tablet   4 Unknown at Unknown time  . liothyronine (CYTOMEL) 5 MCG tablet Take 10 mcg by mouth daily.   Unknown at Unknown time  . midodrine (PROAMATINE) 5 MG tablet Take 1 tablet (5 mg total) by mouth 2 (two) times daily with a meal. For high blood pressure (Patient not taking: Reported on 06/18/2016) 60 tablet 0 Unknown at Unknown time  . Omega-3 Fatty Acids (FISH OIL) 1000 MG CAPS Take 1,000 mg by mouth daily.   Unknown at Unknown time  . progesterone (PROMETRIUM) 100 MG capsule Take 100 mg by mouth See admin instructions. End of cycle.  4 Unknown at Unknown time  . thyroid (ARMOUR)  60 MG tablet Take 120 mg by mouth daily before breakfast.   Unknown at Unknown time  . topiramate (TOPAMAX) 25 MG tablet Take 1 tablet (25 mg total) by mouth 2 (two) times daily. For mood stabilization (Patient not taking: Reported on 06/18/2016) 60 tablet 2 Unknown at Unknown time  . traZODone (DESYREL) 50 MG tablet Take 50 mg by mouth at bedtime.   Unknown at Unknown time  . valACYclovir (VALTREX) 1000 MG tablet Take 1,000 mg by mouth daily.  2 Unknown at Unknown time  . venlafaxine XR (EFFEXOR-XR) 75 MG 24 hr capsule Take 3 capsules (225 mg total) by mouth daily with breakfast. For depression (Patient not taking: Reported on 06/18/2016) 90 capsule 7 Unknown at Unknown time    Patient Stressors: Financial difficulties Health problems Legal issue Marital or family conflict Medication change or noncompliance Traumatic event  Patient Strengths: Ability for insight Active sense of humor Average or above average intelligence Capable of independent living Metallurgist fund of knowledge  Treatment Modalities: Medication Management, Group therapy, Case management,  1 to 1 session with clinician, Psychoeducation, Recreational therapy.   Physician Treatment Plan for Primary Diagnosis: Bipolar I disorder, most recent episode depressed (HCC) Long Term Goal(s): Improvement in symptoms so as ready for discharge Improvement in symptoms so as ready for discharge   Short Term Goals: Ability to verbalize feelings will improve Ability to disclose and discuss suicidal ideas Ability to identify and develop effective coping behaviors will improve Compliance with prescribed medications will improve Ability to identify triggers associated with substance abuse/mental health issues will improve Ability to identify changes in lifestyle to reduce recurrence of condition will improve Ability to demonstrate self-control will improve Ability to identify and develop effective  coping behaviors will improve Compliance with prescribed medications will improve Ability to identify triggers associated with substance abuse/mental health issues will improve  Medication Management: Evaluate patient's response, side effects, and tolerance of medication regimen.  Therapeutic Interventions: 1 to 1 sessions, Unit Group sessions and Medication administration.  Evaluation of Outcomes: Progressing  Physician Treatment Plan for Secondary Diagnosis: Principal Problem:   Bipolar I disorder, most recent episode depressed (HCC) Active Problems:   Bipolar 1 disorder, manic, full remission (H[facility])  Long Term Goal(s): Improvement in symptoms so as ready for discharge Improvement in symptoms so as ready for discharge   Short Term Goals: Ability to verbalize feelings will improve Ability to disclose and discuss suicidal ideas Ability to identify and develop effective coping behaviors will improve Compliance with prescribed medications will improve Ability to identify triggers associated with substance abuse/mental health issues will improve Ability to identify changes  in lifestyle to reduce recurrence of condition will improve Ability to demonstrate self-control will improve Ability to identify and develop effective coping behaviors will improve Compliance with prescribed medications will improve Ability to identify triggers associated with substance abuse/mental health issues will improve     Medication Management: Evaluate patient's response, side effects, and tolerance of medication regimen.  Therapeutic Interventions: 1 to 1 sessions, Unit Group sessions and Medication administration.  Evaluation of Outcomes: Progressing   RN Treatment Plan for Primary Diagnosis: Bipolar I disorder, most recent episode depressed (HCC) Long Term Goal(s): Knowledge of disease and therapeutic regimen to maintain health will improve  Short Term Goals: Ability to remain free from injury will  improve, Ability to verbalize feelings will improve and Ability to disclose and discuss suicidal ideas  Medication Management: RN will administer medications as ordered by provider, will assess and evaluate patient's response and provide education to patient for prescribed medication. RN will report any adverse and/or side effects to prescribing provider.  Therapeutic Interventions: 1 on 1 counseling sessions, Psychoeducation, Medication administration, Evaluate responses to treatment, Monitor vital signs and CBGs as ordered, Perform/monitor CIWA, COWS, AIMS and Fall Risk screenings as ordered, Perform wound care treatments as ordered.  Evaluation of Outcomes: Progressing   LCSW Treatment Plan for Primary Diagnosis: Bipolar I disorder, most recent episode depressed (HCC) Long Term Goal(s): Safe transition to appropriate next level of care at discharge, Engage patient in therapeutic group addressing interpersonal concerns.  Short Term Goals: Engage patient in aftercare planning with referrals and resources, Facilitate patient progression through stages of change regarding substance use diagnoses and concerns and Identify triggers associated with mental health/substance abuse issues  Therapeutic Interventions: Assess for all discharge needs, 1 to 1 time with Social worker, Explore available resources and support systems, Assess for adequacy in community support network, Educate family and significant other(s) on suicide prevention, Complete Psychosocial Assessment, Interpersonal group therapy.  Evaluation of Outcomes: Progressing   Progress in Treatment: Attending groups: Yes. Participating in groups: Yes. Taking medication as prescribed: Yes. Toleration medication: Yes. Family/Significant other contact made: SPE completed with pt; pt declined to consent to family contact.  Patient understands diagnosis: Yes. Discussing patient identified problems/goals with staff: Yes. Medical problems  stabilized or resolved: Yes. Denies suicidal/homicidal ideation: Yes. Issues/concerns per patient self-inventory: No. Other: n/a   New problem(s) identified: No, Describe:  n/a  New Short Term/Long Term Goal(s): detox; medication stabilization; development of comprehensive mental wellness/sobriety plan.   Discharge Plan or Barriers: CSW assessing for appropriate referrals. Pt [place] and [place] outpatient [place] in [place].   4/13:  Has outpatient appointment at [facility] scheduled for next week and plans to discharge on Monday.   Reason for Continuation of Hospitalization: Depression Medication stabilization Suicidal ideation  Estimated Length of Stay: 3-5 days   Attendees: Patient: 06/24/2016 12:43 PM  Physician: Dr. Jama Flavors MD 06/24/2016 12:43 PM  Nursing: Sampson Si RN, Patty RN 06/24/2016 12:43 PM  RN Care Manager: Onnie Boer CM 06/24/2016 12:43 PM  Social Worker: Trula Slade, LCSW 06/24/2016 12:43 PM  Recreational Therapist: x 06/24/2016 12:43 PM  Other: Rebecca Eaton NP 06/24/2016 12:43 PM  Other:  06/24/2016 12:43 PM  Other: 06/24/2016 12:43 PM    Scribe for Treatment Team: Trula Slade, MSW, LCSW Clinical Social Worker 06/24/2016 3:13 PM

## 2016-06-24 NOTE — Progress Notes (Signed)
Recreation Therapy Notes  Date: 06/24/16 Time: 0930 Location: [address] Dayroom  Group Topic: Stress Management  Goal Area(s) Addresses:  Patient will verbalize importance of using healthy stress management.  Patient will identify positive emotions associated with healthy stress management.   Intervention: Stress Management  Activity :  [place].  LRT introduced the stress management technique of guided imagery.  LRT read a script to allow patients to engage in the activity.  Patients were to follow along as LRT read the script to participate in activity.  Education:  Stress Management, Discharge Planning.   Education Outcome: Acknowledges edcuation/In group clarification offered/Needs additional education  Clinical Observations/Feedback: Pt did not attend group.   Alik Mawson, LRT/CTRS         Daney Moor A 06/24/2016 12:04 PM 

## 2016-06-24 NOTE — Progress Notes (Signed)
  Tina [name] attended wrap up group. Her goal for today was to focus on medication management and getting blood pressure under control. Tomorrow, she reports that she wants to actually get motivated to do more as she spends most of her time in bed. She did interact with others prior to group. She rated her day a 7/10.

## 2016-06-24 NOTE — Progress Notes (Signed)
Was called about a consult for patient's low blood pressure/hypotension given patient's low blood pressure of 79/54. Discussed case with Dr. Jama Flavors and recommendation is to send the patient to ED for further evaluation.

## 2016-06-24 NOTE — Progress Notes (Addendum)
[facility] MD Progress Note  06/24/2016 3:24 PM Tina [name]  MRN:  [id number] Subjective:  Patient reports she is feeling better than she did on admission. She reports history of low blood pressure, and reports feeling dizzy and lightheaded in AM. This is not new, at home she manages by drinking fluids in AM, but states it is worse in hospital. Denies suicidal ideations .  Objective : I have discussed case with treatment team and have met with patient . She reports feeling better, and feels her mood is improved . Denies any suicidal ideations. As above, has history of low BP , at times associated with dizziness . She is going to some groups, but tends to isolate in room. No disruptive or agitated behaviors on unit      Principal Problem: Bipolar I disorder, most recent episode depressed ([place]) Diagnosis:   Patient Active Problem List   Diagnosis Date Noted  . Bipolar I disorder, most recent episode depressed ([place]) [F31.30] 06/19/2016  . MDD (major depressive disorder) [F32.9] 06/18/2016  . Bipolar 1 disorder, manic, full remission ([place]) [F31.74] 06/18/2016  . Migraine [G43.909] 02/20/2015  . Affective psychosis, bipolar ([place]) [F31.9]   . Bipolar disorder, current episode depressed, severe, without psychotic features ([place]) [F31.4]   . Bipolar affective disorder, current episode severe ([place]) [F31.9] 12/02/2014  . Bipolar I disorder, current or most recent episode depressed, in partial remission ([place]) [F31.75] 07/11/2014  . Bipolar 1 disorder, mixed ([place]) [F31.60] 06/27/2014  . Disease of thyroid gland [E07.9] 06/27/2014  . Anxiety [F41.9] 06/27/2014  . 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 Cannabis dependence without physiological dependence ([place]) [F12.20] 06/27/2014  . Bipolar 1 disorder, depressed, partial remission ([place]) [F31.75] 06/27/2014  . 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 Alcohol use disorder, severe, dependence ([place]) [F10.20] 06/24/2014  . Bipolar disorder,  unspecified [F31.9] 02/16/2013  . Adrenal insufficiency ([place]) [E27.40]   . Acne [L70.9]   . Hx of Lyme disease [Z86.19]   . 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 Bipolar I disorder, most recent episode (or current) manic ([place]) [F31.10] 08/29/2012  . Hypothyroidism [E03.9] 12/23/2010  . Myalgia [M79.1] 12/23/2010  . Fatigue [R53.83] 12/23/2010  . Unspecified vitamin deficiency [E56.9] 12/23/2010  . Depression [F32.9]    Total Time spent with patient: 20 minutes  Past Psychiatric History: As in H&P  Past Medical History:  Past Medical History:  Diagnosis Date  . Acne   . Adrenal insufficiency ([place])   . Bipolar 2 disorder ([place])   . Cellulitis   . Depression   . Genital warts   . Headache   . History of chicken pox   . Hx of Lyme disease   . Hypothyroidism     Past Surgical History:  Procedure Laterality Date  . CESAREAN SECTION  2003, 2007, 2011  . INGUINAL HERNIA REPAIR Bilateral   . TONSILLECTOMY    . UMBILICAL HERNIA REPAIR     Family History:  Family History  Problem Relation Age of Onset  . Other Mother     PAD  . Heart disease Other     Grandparent  . Alcohol abuse Paternal Grandmother   . Mental retardation Paternal Grandmother   . Arthritis Maternal Grandfather   . Heart disease Maternal Grandfather    Family Psychiatric  History: As in H&P  Social History:  History  Alcohol Use  . Yes    Comment: social drinker-few drinks a week      History  Drug Use No  Social History   Social History  . Marital status: Single    Spouse name: N/A  . Number of children: N/A  . Years of education: 42   Occupational History  . HOMEMAKER    Social History Main Topics  . Smoking status: Current Every Day Smoker    Packs/day: 1.50    Years: 29.00    Types: Cigarettes  . Smokeless tobacco: Never Used  . Alcohol use Yes     Comment: social drinker-few drinks a week   . Drug use: No  . Sexual activity: Not Currently   Other Topics Concern  . None    Social History Narrative   Regular exercise-no   Additional Social History:    Pain Medications: occ alcohol Prescriptions: none       Sleep: Broken last night  Appetite:  Good  Current Medications: Current Facility-Administered Medications  Medication Dose Route Frequency Provider Last Rate Last Dose  . ARIPiprazole (ABILIFY) tablet 5 mg  5 mg Oral Daily Artist Beach, MD   5 mg at 06/23/16 0800  . bisacodyl (DULCOLAX) suppository 10 mg  10 mg Rectal Once Encarnacion Slates, NP   Stopped at 06/22/16 0900  . hydrOXYzine (ATARAX/VISTARIL) tablet 50 mg  50 mg Oral QHS PRN Encarnacion Slates, NP   50 mg at 06/22/16 2119  . ibuprofen (ADVIL,MOTRIN) tablet 600 mg  600 mg Oral Q6H PRN Shuvon B Rankin, NP   600 mg at 06/20/16 1637  . lamoTRIgine (LAMICTAL) tablet 25 mg  25 mg Oral Daily Shuvon B Rankin, NP   25 mg at 06/24/16 1017  . LORazepam (ATIVAN) tablet 0.5 mg  0.5 mg Oral Q8H PRN Artist Beach, MD      . magnesium hydroxide (MILK OF MAGNESIA) suspension 30 mL  30 mL Oral Daily PRN Encarnacion Slates, NP   30 mL at 06/21/16 1643  . metroNIDAZOLE (FLAGYL) tablet 500 mg  500 mg Oral Q12H Kerrie Buffalo, NP   500 mg at 06/24/16 1016  . midodrine (PROAMATINE) tablet 5 mg  5 mg Oral BID WC Kerrie Buffalo, NP   5 mg at 06/24/16 1018  . nicotine (NICODERM CQ - dosed in mg/24 hours) patch 21 mg  21 mg Transdermal Daily Myer Peer Kolten Ryback, MD   21 mg at 06/24/16 0800  . thyroid (ARMOUR) tablet 90 mg  90 mg Oral QAC breakfast Jenne Campus, MD   90 mg at 06/23/16 0762  . traZODone (DESYREL) tablet 75 mg  75 mg Oral QHS Artist Beach, MD   75 mg at 06/23/16 2122  . venlafaxine XR (EFFEXOR-XR) 24 hr capsule 75 mg  75 mg Oral Q breakfast Artist Beach, MD   75 mg at 06/24/16 1018    Lab Results:  No results found for this or any previous visit (from the past 48 hour(s)).  Blood Alcohol level:  Lab Results  Component Value Date   ETH <5 06/18/2016   ETH <5 26/33/3545    Metabolic  Disorder Labs: Lab Results  Component Value Date   HGBA1C 5.1 12/03/2014   MPG 100 12/03/2014   MPG 100 12/02/2014   No results found for: PROLACTIN Lab Results  Component Value Date   CHOL 195 12/03/2014   TRIG 122 12/03/2014   HDL 42 12/03/2014   CHOLHDL 4.6 12/03/2014   VLDL 24 12/03/2014   LDLCALC 129 (H) 12/03/2014    Physical Findings: AIMS: Facial and Oral Movements Muscles of Facial Expression:  None, normal Lips and Perioral Area: None, normal Jaw: None, normal Tongue: None, normal,Extremity Movements Upper (arms, wrists, hands, fingers): None, normal Lower (legs, knees, ankles, toes): None, normal, Trunk Movements Neck, shoulders, hips: None, normal, Overall Severity Severity of abnormal movements (highest score from questions above): None, normal Incapacitation due to abnormal movements: None, normal Patient's awareness of abnormal movements (rate only patient's report): No Awareness, Dental Status Current problems with teeth and/or dentures?: No Does patient usually wear dentures?: No  CIWA:  CIWA-Ar Total: 5 COWS:  COWS Total Score: 1  Musculoskeletal: Strength & Muscle Tone: within normal limits Gait & Station: normal Patient leans: N/A  Psychiatric Specialty Exam: Physical Exam  Constitutional: She is oriented to person, place, and time. She appears well-developed and well-nourished.  HENT:  Head: Normocephalic and atraumatic.  Eyes: Conjunctivae and EOM are normal. Pupils are equal, round, and reactive to light.  Neck: Normal range of motion. Neck supple.  Cardiovascular: Normal rate, regular rhythm and normal heart sounds.   Respiratory: Effort normal and breath sounds normal.  GI: Soft. Bowel sounds are normal.  Musculoskeletal: Normal range of motion.  Neurological: She is alert and oriented to person, place, and time. She has normal reflexes.  Skin: Skin is warm and dry.  Psychiatric:  As above    ROS episodes of dizziness, lightheadedness,  no chest pain  Blood pressure (!) 79/54, pulse 96, temperature 98.4 F (36.9 C), temperature source Oral, resp. rate 20, height 5' 1.25" (1.556 m), weight 46.4 kg (102 lb 5 oz), SpO2 100 %.Body mass index is 19.17 kg/m.  General Appearance:  Improved grooming   Eye Contact:  Good  Speech:  Normal   Volume:  Decreased   Mood:  Improving , less depressed   Affect:  Constricted but smiles at times appropriately   Thought Process:  Goal Directed and Descriptions of Associations: Intact  Orientation:  Other:  fully alert and attentive   Thought Content:  No negative ruminations. No delusional theme. No preoccupation with violent thoughts.  No obsession.  No hallucination in any modality.   Suicidal Thoughts:  No at this time denies suicidal or self injurious ideations, also denies any homicidal or violent ideations  Homicidal Thoughts:  No  Memory:  Recent and remote grossly intact   Judgement:  Improved   Insight:  Present  Psychomotor Activity:  Decreased   Concentration:  Good   Recall:  Good   Fund of Knowledge:  Good  Language:  Good  Akathisia:  Negative  Handed:  Right   AIMS (if indicated):     Assets:  Communication Skills Desire for Improvement  ADL's:  Intact  Cognition:  WNL  Sleep:  Number of Hours: 6.75   Assessment-  Patient is reporting improving mood , feeling better than on admission . Sleep has improved partially. No suicidal ideations at this time.  She has history of low BP, hypotension. Abilify may be a contributor, as she states she has been feeling more dizzy since she started taking it.   Treatment Plan Summary: Treatment plan reviewed as below today 4/13.   PLAN: Continue to encourage group and milieu participation to work on coping skills and symptom reduction Continue  Trazodone 75 mg  QHS for insomnia Continue Ativan 0.5 mg PRN Q8H for anxiety as needed  D/C Abilify - see rationale for taper above  Continue Lamictal 25 mgrs QDAY for mood disorder,  depression Continue Armour thyroid for management of hypothyroidism  Treatment team working on  disposition planning  I have requested hospitalist consultation regarding persistently low BP, recommendation is to continue midodrin, encourage PO fluids, and obtain AM cortisol level   Consider discharge this weekend if continues to stabilize Hildred Laser      Jenne Campus, MD 06/24/2016, 3:24 PM   Patient ID: Napoleon Form, female   DOB: 05-01-1970, 46 y.o.   MRN: [id number]

## 2016-06-24 NOTE — Progress Notes (Signed)
  [facility] Adult Case Management Discharge Plan :  Will you be returning to the same living situation after discharge:  Yes,  pt returning home. At discharge, do you have transportation home?: Yes,  pt has access to transportation. Do you have the ability to pay for your medications: Yes,  pt has insurance.  Release of information consent forms completed and in the chart;  Patient's signature needed at discharge.  Patient to Follow up at: Follow-up Information    [facility] Follow up on 06/29/2016.   Specialty:  Behavioral Health Why:  Appt on Wednesday 4/18 at 10:00AM for initial appt with counselor and assessment for medication management. Please arrive 15-20 minutes early and bring medicaid card with you to this appt. Thank you.  Contact information: [address] [place] [address] [phone]           Next level of care provider has access to [facility] Link:no  Safety Planning and Suicide Prevention discussed: Yes,  with pt.  Have you used any form of tobacco in the last 30 days? (Cigarettes, Smokeless Tobacco, Cigars, and/or Pipes): Yes  Has patient been referred to the Quitline?: Patient refused referral  Patient has been referred for addiction treatment: Yes  Jonathon Jordan 06/24/2016, 3:53 PM

## 2016-06-24 NOTE — [facility] Group Notes (Signed)
[facility] LCSW Group Therapy 06/24/2016 1:15pm  Type of Therapy: Group Therapy- Feelings Around Relapse and Recovery  Participation Level: Pt was present for the duration of the group. Pt did not share but did listen attentively to others.   Jonathon Jordan, MSW, Theresia Majors [phone] 06/24/2016 4:28 PM

## 2016-06-24 NOTE — [facility] Group Notes (Signed)
[facility] LCSW Group Therapy  06/24/2016 3:11 PM  Type of Therapy:  Group Therapy  Participation Level:  Did Not Attend-pt invited. Chose not to attend.   Summary of Progress/Problems: Feelings around Relapse. Group members discussed the meaning of relapse and shared personal stories of relapse, how it affected them and others, and how they perceived themselves during this time. Group members were encouraged to identify triggers, warning signs and coping skills used when facing the possibility of relapse. Social supports were discussed and explored in detail.  Tavarion Babington N Smart LCSW 06/24/2016, 3:11 PM

## 2016-06-25 LAB — CORTISOL-AM, BLOOD: Cortisol - AM: 3.6 ug/dL — ABNORMAL LOW (ref 6.7–22.6)

## 2016-06-25 NOTE — [facility] Group Notes (Signed)
[facility] Group Notes: (Clinical Social Work)   06/25/2016      Type of Therapy:  Group Therapy   Participation Level:  Did Not Attend despite MHT prompting   Ambrose Mantle, LCSW 06/25/2016, 1:49 PM

## 2016-06-25 NOTE — Progress Notes (Signed)
D Tina [name] is seen OOB UAL on the 400 hall today and she tolerates this very well. She still ahs a VERY lovw BP and ted hose are given for her toear, as well as she is encouraged to continue to drink fluids AMAP and si given a full water pitcher with fresh gatorade in it. She is making better eye contact and she completes her daily assessment first thing and on this she writes she  Denies SI today and she rates her depression, hopelessness and anxeity " 8/8/4", respectively. She is worried that her abnormal cortisol level ( 3.6) will delay her discharge home tomorrow and she is encouraged to talk to her physician tomorrow about how to treat low cortisol level. R Pt reports feeling " more like myslef today". BP's getting more stable ( 83/67 at 1200 and 96/57 1700). R Safety in place and poc cont.

## 2016-06-25 NOTE — Progress Notes (Signed)
Psychoeducational Group Note  Date:  06/25/2016 Time: 2100 Group Topic/Focus:  Wrap up group Participation Level: Did Not Attend  Participation Quality:  Not Applicable  Affect:  Not Applicable  Cognitive:  Not Applicable  Insight:  Not Applicable  Engagement in Group: Not Applicable  Additional Comments: Pt was notified that group was beginning but remained in bed during wrap up group. [name] 06/25/2016, 10:52 PM

## 2016-06-25 NOTE — Progress Notes (Signed)
Patient ID: Tina [name], female   DOB: 1970-12-18, 46 y.o.   MRN: [id number]   D: Patient pleasant on approach tonight. Reports mood better today. Contracts for safety on the unit. Reports increased anxiety today. PRN given. A: Staff will monitor on q 15 minute checks and follow medication/ treatment orders. R: Cooperative on the unit. Attending groups

## 2016-06-25 NOTE — Progress Notes (Signed)
[facility] MD Progress Note  06/25/2016 1:53 PM Tina [name]  MRN:  [id number] Subjective:  46 yo Caucasian female, divorced, lives alone. Background history of Bipolar disorder. Patient has been off her psychotropic medications since August last year. She reports mood swings with periods of elation. She presented in company of her family because she has been expressing thoughts of suicide. She reports being depressed lately.   Seen today. Chart reviewed prior to interview Feeling better each day. Says she knows it would take time for her depression to lift completely. Says she is optimistic about going home on Sunday. No suicidal thoughts. No evidence of mania. No psychosis. Blood pressure remains a challenge.   Nursing staff reports that patient has been appropriate on the unit. Patient has been interacting well with peers. No behavioral issues. Patient has not voiced any suicidal thoughts. Patient has not been observed to be internally stimulated. Patient has been adherent with treatment recommendations. Patient has been tolerating their medication well.     Principal Problem: Bipolar I disorder, most recent episode depressed (HCC) Diagnosis:   Patient Active Problem List   Diagnosis Date Noted  . Bipolar I disorder, most recent episode depressed (HCC) [F31.30] 06/19/2016  . MDD (major depressive disorder) [F32.9] 06/18/2016  . Bipolar 1 disorder, manic, full remission (HCC) [F31.74] 06/18/2016  . Migraine [G43.909] 02/20/2015  . Affective psychosis, bipolar (HCC) [F31.9]   . Bipolar disorder, current episode depressed, severe, without psychotic features (HCC) [F31.4]   . Bipolar affective disorder, current episode severe (HCC) [F31.9] 12/02/2014  . Bipolar I disorder, current or most recent episode depressed, in partial remission (HCC) [F31.75] 07/11/2014  . Bipolar 1 disorder, mixed (HCC) [F31.60] 06/27/2014  . Disease of thyroid gland [E07.9] 06/27/2014  . Anxiety [F41.9] 06/27/2014   . 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 Bipolar 1 disorder, depressed, partial remission (HCC) [F31.75] 06/27/2014  . 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 Alcohol use disorder, severe, dependence (HCC) [F10.20] 06/24/2014  . Bipolar disorder, unspecified (HCC) [F31.9] 02/16/2013  . Adrenal insufficiency (HCC) [E27.40]   . Acne [L70.9]   . Hx of Lyme disease [Z86.19]   . 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 Bipolar I disorder, most recent episode (or current) manic (HCC) [F31.10] 08/29/2012  . Hypothyroidism [E03.9] 12/23/2010  . Myalgia [M79.1] 12/23/2010  . Fatigue [R53.83] 12/23/2010  . Unspecified vitamin deficiency [E56.9] 12/23/2010  . Depression [F32.9]    Total Time spent with patient: 20 minutes  Past Psychiatric History: As in H&P  Past Medical History:  Past Medical History:  Diagnosis Date  . Acne   . Adrenal insufficiency (HCC)   . Bipolar 2 disorder (HCC)   . Cellulitis   . Depression   . Genital warts   . Headache   . History of chicken pox   . Hx of Lyme disease   . Hypothyroidism     Past Surgical History:  Procedure Laterality Date  . CESAREAN SECTION  2003, 2007, 2011  . INGUINAL HERNIA REPAIR Bilateral   . TONSILLECTOMY    . UMBILICAL HERNIA REPAIR     Family History:  Family History  Problem Relation Age of Onset  . Other Mother     PAD  . Heart disease Other     Grandparent  . Alcohol abuse Paternal Grandmother   . Mental retardation Paternal Grandmother   . Arthritis Maternal Grandfather   . Heart disease Maternal Grandfather  Family Psychiatric  History: As in H&P  Social History:  History  Alcohol Use  . Yes    Comment: social drinker-few drinks a week      History  Drug Use No    Social History   Social History  . Marital status: Single    Spouse name: N/A  . Number of children: N/A  . Years of education: 13    Occupational History  . HOMEMAKER    Social History Main Topics  . Smoking status: Current Every Day Smoker    Packs/day: 1.50    Years: 29.00    Types: Cigarettes  . Smokeless tobacco: Never Used  . Alcohol use Yes     Comment: social drinker-few drinks a week   . Drug use: No  . Sexual activity: Not Currently   Other Topics Concern  . None   Social History Narrative   Regular exercise-no   Additional Social History:    Pain Medications: occ alcohol Prescriptions: none       Sleep: Broken last night  Appetite:  Good  Current Medications: Current Facility-Administered Medications  Medication Dose Route Frequency Provider Last Rate Last Dose  . bisacodyl (DULCOLAX) suppository 10 mg  10 mg Rectal Once Sanjuana Kava, NP   Stopped at 06/22/16 0900  . hydrOXYzine (ATARAX/VISTARIL) tablet 50 mg  50 mg Oral QHS PRN Sanjuana Kava, NP   50 mg at 06/22/16 2119  . ibuprofen (ADVIL,MOTRIN) tablet 600 mg  600 mg Oral Q6H PRN Shuvon B Rankin, NP   600 mg at 06/20/16 1637  . lamoTRIgine (LAMICTAL) tablet 25 mg  25 mg Oral Daily Shuvon B Rankin, NP   25 mg at 06/25/16 1211  . LORazepam (ATIVAN) tablet 0.5 mg  0.5 mg Oral Q8H PRN Georgiann Cocker, MD   0.5 mg at 06/24/16 2001  . magnesium hydroxide (MILK OF MAGNESIA) suspension 30 mL  30 mL Oral Daily PRN Sanjuana Kava, NP   30 mL at 06/21/16 1643  . metroNIDAZOLE (FLAGYL) tablet 500 mg  500 mg Oral Q12H Adonis Brook, NP   500 mg at 06/25/16 1210  . midodrine (PROAMATINE) tablet 5 mg  5 mg Oral BID WC Adonis Brook, NP   5 mg at 06/25/16 1211  . nicotine (NICODERM CQ - dosed in mg/24 hours) patch 21 mg  21 mg Transdermal Daily Craige Cotta, MD   21 mg at 06/25/16 1212  . thyroid (ARMOUR) tablet 90 mg  90 mg Oral QAC breakfast Craige Cotta, MD   90 mg at 06/25/16 0630  . traZODone (DESYREL) tablet 75 mg  75 mg Oral QHS Georgiann Cocker, MD   75 mg at 06/24/16 2109  . venlafaxine XR (EFFEXOR-XR) 24 hr capsule 75 mg  75  mg Oral Q breakfast Georgiann Cocker, MD   75 mg at 06/25/16 1211    Lab Results:  Results for orders placed or performed during the hospital encounter of 06/18/16 (from the past 48 hour(s))  Cortisol-am, blood     Status: Abnormal   Collection Time: 06/24/16  6:14 PM  Result Value Ref Range   Cortisol - AM 3.6 (L) 6.7 - 22.6 ug/dL    Comment: Performed at [facility], [address], [place], [place] [address]    Blood Alcohol level:  Lab Results  Component Value Date   [facility] <5 06/18/2016   ETH <5 12/01/2014    Metabolic Disorder Labs: Lab Results  Component  Value Date   HGBA1C 5.1 12/03/2014   MPG 100 12/03/2014   MPG 100 12/02/2014   No results found for: PROLACTIN Lab Results  Component Value Date   CHOL 195 12/03/2014   TRIG 122 12/03/2014   HDL 42 12/03/2014   CHOLHDL 4.6 12/03/2014   VLDL 24 12/03/2014   LDLCALC 129 (H) 12/03/2014    Physical Findings: AIMS: Facial and Oral Movements Muscles of Facial Expression: None, normal Lips and Perioral Area: None, normal Jaw: None, normal Tongue: None, normal,Extremity Movements Upper (arms, wrists, hands, fingers): None, normal Lower (legs, knees, ankles, toes): None, normal, Trunk Movements Neck, shoulders, hips: None, normal, Overall Severity Severity of abnormal movements (highest score from questions above): None, normal Incapacitation due to abnormal movements: None, normal Patient's awareness of abnormal movements (rate only patient's report): No Awareness, Dental Status Current problems with teeth and/or dentures?: No Does patient usually wear dentures?: No  CIWA:  CIWA-Ar Total: 5 COWS:  COWS Total Score: 1  Musculoskeletal: Strength & Muscle Tone: within normal limits Gait & Station: normal Patient leans: N/A  Psychiatric Specialty Exam: Physical Exam  Constitutional: She is oriented to person, place, and time. She appears well-developed and well-nourished.  HENT:  Head: Normocephalic and  atraumatic.  Eyes: Conjunctivae and EOM are normal. Pupils are equal, round, and reactive to light.  Neck: Normal range of motion. Neck supple.  Cardiovascular: Normal rate, regular rhythm and normal heart sounds.   Respiratory: Effort normal and breath sounds normal.  GI: Soft. Bowel sounds are normal.  Musculoskeletal: Normal range of motion.  Neurological: She is alert and oriented to person, place, and time. She has normal reflexes.  Skin: Skin is warm and dry.  Psychiatric:  As above    ROS  Blood pressure (!) 83/67, pulse (!) 103, temperature 97.9 F (36.6 C), temperature source Oral, resp. rate 20, height 5' 1.25" (1.556 m), weight 46.4 kg (102 lb 5 oz), SpO2 100 %.Body mass index is 19.17 kg/m.  General Appearance: Neatly dressed, pleasant, engaging well and cooperative. Appropriate behavior. Not in any distress. Good relatedness. Not internally stimulated   Eye Contact:  Good  Speech:  Spontaneous, normal tone and rate  Volume:  Normal  Mood:  Less Depressed  Affect:  Restricted but mobilizing some positive affect  Thought Process:  Linear  Orientation:  Full (Time, Place, and Person)  Thought Content:  No negative ruminations. No delusional theme. No preoccupation with violent thoughts.  No obsession.  No hallucination in any modality.   Suicidal Thoughts:  No  Homicidal Thoughts:  No  Memory:  Good  Judgement:  Good  Insight:  Good  Psychomotor Activity: Normal  Concentration:  Good  Recall:  Good  Fund of Knowledge:  Good  Language:  Good  Akathisia:  No  Handed:    AIMS (if indicated):     Assets:  Communication Skills Desire for Improvement Financial Resources/Insurance Housing  ADL's:  Intact  Cognition:  WNL  Sleep:  Number of Hours: 6.75     Treatment Plan Summary: Depression is lifting. Patient is not a danger to herself or others. Hopeful discharge tomorrow.   Psychiatric: Bipolar I Disorder. 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 Continue current regimen 2. Continue to monitor mood, behavior and interaction with peers. 3. Hopeful discharge tomorrow    Georgiann Cocker, MD 06/25/2016, 1:53 PMPatient  ID: Tina [name], female   DOB: Jul 01, 1970, 46 y.o.   MRN: [id number] Patient ID: Tina [name], female   DOB: November 10, 1970, 46 y.o.   MRN: [id number] Patient ID: Tina [name], female   DOB: 1970-10-02, 46 y.o.   MRN: [id number]

## 2016-06-26 MED ORDER — THYROID 90 MG PO TABS: 90.0000 mg | ORAL_TABLET | Freq: Every day | ORAL | 0 refills | Status: DC

## 2016-06-26 MED ORDER — MIDODRINE HCL 5 MG PO TABS: 5.0000 mg | ORAL_TABLET | Freq: Two times a day (BID) | ORAL | 0 refills | Status: DC

## 2016-06-26 MED ORDER — LAMOTRIGINE 25 MG PO TABS: 25.0000 mg | ORAL_TABLET | Freq: Every day | ORAL | 0 refills | Status: DC

## 2016-06-26 MED ORDER — VENLAFAXINE HCL ER 75 MG PO CP24: 75.0000 mg | ORAL_CAPSULE | Freq: Every day | ORAL | 0 refills | Status: DC

## 2016-06-26 MED ORDER — METRONIDAZOLE 500 MG PO TABS: 500.0000 mg | ORAL_TABLET | Freq: Two times a day (BID) | ORAL | 0 refills | Status: DC

## 2016-06-26 MED ORDER — TRAZODONE HCL 150 MG PO TABS: 75.0000 mg | ORAL_TABLET | Freq: Every day | ORAL | 0 refills | Status: DC

## 2016-06-26 MED ORDER — HYDROXYZINE HCL 50 MG PO TABS: 50.0000 mg | ORAL_TABLET | Freq: Every evening | ORAL | 0 refills | Status: DC | PRN

## 2016-06-26 NOTE — [facility] Group Notes (Signed)
The focus of this group is to educate the patient on the purpose and policies of crisis stabilization and provide a format to answer questions about their admission.  The group details unit policies and expectations of patients while admitted.  Patient did not attend 0900 nurse education orientation group this morning.  Patient stayed in bed.   

## 2016-06-26 NOTE — Discharge Summary (Signed)
Physician Discharge Summary Note  Patient:  Tina [name] is an 46 y.o., female MRN:  [id number] DOB:  Nov 03, 1970 Patient phone:  [phone] (home)  Patient address:   [address] [place] [address],  Total Time spent with patient: 30 minutes  Date of Admission:  06/18/2016 Date of Discharge: 06/26/2016 Reason for Admission:Per HPI- Patient was brought to hospital after telling her mother she was having suicidal thoughts.  Patient reports that she has had an worsening of depression for at least a month.  Patient also states that she has been off of her medications since August 2017.  Patient denies hallucinations and paranoia but states that she does have disorganized thoughts.  Rates depression 10/10 and anxiety 10/10 (0/none and 10/10 worse.).  Patient continues to endorse suicidal thoughts stating that she is thinking of multiple ways she could do it but no specific plan.  Stressors for depression "I have a chemical imparlance. I mean there are other things but"  Patient would not elaborate any further about the reason/stressor for depression.  At this time patient denies homicidal ideation, paranoia, and psychosis.   Principal Problem: Bipolar I disorder, most recent episode depressed [facility]) Discharge Diagnoses: Patient Active Problem List   Diagnosis Date Noted  . Bipolar I disorder, most recent episode depressed (HCC) [F31.30] 06/19/2016  . MDD (major depressive disorder) [F32.9] 06/18/2016  . Bipolar 1 disorder, manic, full remission (HCC) [F31.74] 06/18/2016  . Migraine [G43.909] 02/20/2015  . Affective psychosis, bipolar (HCC) [F31.9]   . Bipolar disorder, current episode depressed, severe, without psychotic features (HCC) [F31.4]   . Bipolar affective disorder, current episode severe (HCC) [F31.9] 12/02/2014  . Bipolar I disorder, current or most recent episode depressed, in partial remission (HCC) [F31.75] 07/11/2014  . Bipolar 1 disorder, mixed (HCC) [F31.60]  06/27/2014  . Disease of thyroid gland [E07.9] 06/27/2014  . Anxiety [F41.9] 06/27/2014  . 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 Bipolar 1 disorder, depressed, partial remission (HCC) [F31.75] 06/27/2014  . 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 Alcohol use disorder, severe, dependence (HCC) [F10.20] 06/24/2014  . Bipolar disorder, unspecified (HCC) [F31.9] 02/16/2013  . Adrenal insufficiency (HCC) [E27.40]   . Acne [L70.9]   . Hx of Lyme disease [Z86.19]   . 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 Bipolar I disorder, most recent episode (or current) manic (HCC) [F31.10] 08/29/2012  . Hypothyroidism [E03.9] 12/23/2010  . Myalgia [M79.1] 12/23/2010  . Fatigue [R53.83] 12/23/2010  . Unspecified vitamin deficiency [E56.9] 12/23/2010  . Depression [F32.9]     Past Psychiatric History:   Past Medical History:  Past Medical History:  Diagnosis Date  . Acne   . Adrenal insufficiency (HCC)   . Bipolar 2 disorder (HCC)   . Cellulitis   . Depression   . Genital warts   . Headache   . History of chicken pox   . Hx of Lyme disease   . Hypothyroidism     Past Surgical History:  Procedure Laterality Date  . CESAREAN SECTION  2003, 2007, 2011  . INGUINAL HERNIA REPAIR Bilateral   . TONSILLECTOMY    . UMBILICAL HERNIA REPAIR     Family History:  Family History  Problem Relation Age of Onset  . Other Mother     PAD  . Heart disease Other     Grandparent  . Alcohol abuse Paternal Grandmother   . Mental retardation Paternal Grandmother   . Arthritis Maternal Grandfather   .  Heart disease Maternal Grandfather    Family Psychiatric  History:  Social History:  History  Alcohol Use  . Yes    Comment: social drinker-few drinks a week      History  Drug Use No    Social History   Social History  . Marital status: Single    Spouse name: N/A  . Number of children: N/A  . Years of  education: 23   Occupational History  . HOMEMAKER    Social History Main Topics  . Smoking status: Current Every Day Smoker    Packs/day: 1.50    Years: 29.00    Types: Cigarettes  . Smokeless tobacco: Never Used  . Alcohol use Yes     Comment: social drinker-few drinks a week   . Drug use: No  . Sexual activity: Not Currently   Other Topics Concern  . None   Social History Narrative   Regular exercise-no    Hospital Course:  Emi Lymon was admitted for Bipolar I disorder, most recent episode depressed (HCC)  and crisis management.  Pt was treated discharged with the medications listed below under Medication List.  Medical problems were identified and treated as needed.  Home medications were restarted as appropriate.  Improvement was monitored by observation and [name] 's daily report of symptom reduction.  Emotional and mental status was monitored by daily self-inventory reports completed by [name] and clinical staff.         [name] was evaluated by the treatment team for stability and plans for continued recovery upon discharge. [name] 's motivation was an integral factor for scheduling further treatment. Employment, transportation, bed availability, health status, family support, and any pending legal issues were also considered during hospital stay. Pt was offered further treatment options upon discharge including but not limited to Residential, Intensive Outpatient, and Outpatient treatment.  [name] will follow up with the services as listed below under Follow Up Information.     Upon completion of this admission the patient was both mentally and medically stable for discharge denying suicidal/homicidal ideation, auditory/visual/tactile hallucinations, delusional thoughts and paranoia.      [name] responded well to treatment with Lamictal 25 mg and Effexor 75 mg without adverse  effects. Pt demonstrated improvement without reported or observed adverse effects to the point of stability appropriate for outpatient management. Pertinent labs include: Cortisol, BMP and TSH for which outpatient follow-up is necessary for lab recheck as mentioned below. Reviewed CBC, CMP, BAL, and UDS; all unremarkable aside from noted exceptions.   Physical Findings: AIMS: Facial and Oral Movements Muscles of Facial Expression: None, normal Lips and Perioral Area: None, normal Jaw: None, normal Tongue: None, normal,Extremity Movements Upper (arms, wrists, hands, fingers): None, normal Lower (legs, knees, ankles, toes): None, normal, Trunk Movements Neck, shoulders, hips: None, normal, Overall Severity Severity of abnormal movements (highest score from questions above): None, normal Incapacitation due to abnormal movements: None, normal Patient's awareness of abnormal movements (rate only patient's report): No Awareness, Dental Status Current problems with teeth and/or dentures?: No Does patient usually wear dentures?: No  CIWA:  CIWA-Ar Total: 5 COWS:  COWS Total Score: 1  Musculoskeletal: Strength & Muscle Tone: within normal limits Gait & Station: normal Patient leans: N/A  Psychiatric Specialty Exam: See SRA by MD  Physical Exam  Nursing note and vitals reviewed. Constitutional: She is oriented to person, place, and time. She appears well-developed.  Neurological: She is  alert and oriented to person, place, and time.  Psychiatric: She has a normal mood and affect. Her behavior is normal.    Review of Systems  Psychiatric/Behavioral: Negative for depression (stable). The patient is not nervous/anxious (stable).     Blood pressure (!) 75/48, pulse (!) 103, temperature 98.6 F (37 C), temperature source Oral, resp. rate 16, height 5' 1.25" (1.556 m), weight 46.4 kg (102 lb 5 oz), SpO2 100 %.Body mass index is 19.17 kg/m.   Have you used any form of tobacco in the last 30  days? (Cigarettes, Smokeless Tobacco, Cigars, and/or Pipes): Yes  Has this patient used any form of tobacco in the last 30 days? (Cigarettes, Smokeless Tobacco, Cigars, and/or Pipes) Yes, No  Blood Alcohol level:  Lab Results  Component Value Date   ETH <5 06/18/2016   ETH <5 12/01/2014    Metabolic Disorder Labs:  Lab Results  Component Value Date   HGBA1C 5.1 12/03/2014   MPG 100 12/03/2014   MPG 100 12/02/2014   No results found for: PROLACTIN Lab Results  Component Value Date   CHOL 195 12/03/2014   TRIG 122 12/03/2014   HDL 42 12/03/2014   CHOLHDL 4.6 12/03/2014   VLDL 24 12/03/2014   LDLCALC 129 (H) 12/03/2014    See Psychiatric Specialty Exam and Suicide Risk Assessment completed by Attending Physician prior to discharge.  Discharge destination:  Home  Is patient on multiple antipsychotic therapies at discharge:  No   Has Patient had three or more failed trials of antipsychotic monotherapy by history:  No  Recommended Plan for Multiple Antipsychotic Therapies: NA  Discharge Instructions    Diet - low sodium heart healthy    Complete by:  As directed    Increase activity slowly    Complete by:  As directed      Allergies as of 06/26/2016      Reactions   Stadol [butorphanol] Other (See Comments)   Dependence   Vancomycin Itching   Penicillins Hives, Rash   Sulfa Antibiotics Rash      Medication List    STOP taking these medications   amphetamine-dextroamphetamine 10 MG tablet Commonly known as:  ADDERALL   aspirin-acetaminophen-caffeine 250-250-65 MG tablet Commonly known as:  EXCEDRIN MIGRAINE   cholecalciferol 1000 units tablet Commonly known as:  VITAMIN D   clindamycin 1 % external solution Commonly known as:  CLEOCIN T   cyanocobalamin 1000 MCG/ML injection Commonly known as:  (VITAMIN B-12)   Fish Oil 1000 MG Caps   Hydroxocobalamin 1000 MCG/ML Soln   liothyronine 5 MCG tablet Commonly known as:  CYTOMEL   progesterone 100 MG  capsule Commonly known as:  PROMETRIUM   topiramate 25 MG tablet Commonly known as:  TOPAMAX   valACYclovir 1000 MG tablet Commonly known as:  VALTREX   VITAMIN B COMPLEX 100 Inj   vitamin C 100 MG tablet     TAKE these medications     Indication  hydrOXYzine 50 MG tablet Commonly known as:  ATARAX/VISTARIL Take 1 tablet (50 mg total) by mouth at bedtime as needed for anxiety (sleep).  Indication:  Anxiety associated with Organic Disease   lamoTRIgine 25 MG tablet Commonly known as:  LAMICTAL Take 1 tablet (25 mg total) by mouth daily. What changed:  medication strength  how much to take  Indication:  Manic-Depression   metroNIDAZOLE 500 MG tablet Commonly known as:  FLAGYL Take 1 tablet (500 mg total) by mouth every 12 (twelve) hours.  Indication:  Vaginosis caused by Bacteria   midodrine 5 MG tablet Commonly known as:  PROAMATINE Take 1 tablet (5 mg total) by mouth 2 (two) times daily with a meal. What changed:  additional instructions  Indication:  Blood Pressure Drop Upon Standing   thyroid 90 MG tablet Commonly known as:  ARMOUR Take 1 tablet (90 mg total) by mouth daily before breakfast. What changed:  how much to take  how to take this  when to take this  Another medication with the same name was removed. Continue taking this medication, and follow the directions you see here.  Indication:  Thyroid Gland Function Test   traZODone 150 MG tablet Commonly known as:  DESYREL Take 0.5 tablets (75 mg total) by mouth at bedtime. What changed:  medication strength  how much to take  Indication:  Trouble Sleeping   venlafaxine XR 75 MG 24 hr capsule Commonly known as:  EFFEXOR-XR Take 1 capsule (75 mg total) by mouth daily with breakfast. What changed:  how much to take  additional instructions  Indication:  Generalized Anxiety Disorder, Major Depressive Disorder      Follow-up Information    [facility] Follow up on 06/29/2016.    Specialty:  Behavioral Health Why:  Appt on Wednesday 4/18 at 10:00AM for initial appt with counselor and assessment for medication management. Please arrive 15-20 minutes early and bring medicaid card with you to this appt. Thank you.  Contact information: [address] [place] [address] [phone]           Follow-up recommendations:  Activity:  as tolerated Diet:  heart healthy Tests:  Cortisol level  Comments: Take all medications as prescribed. Keep all follow-up appointments as scheduled.  Do not consume alcohol or use illegal drugs while on prescription medications. Report any adverse effects from your medications to your primary care provider promptly.  In the event of recurrent symptoms or worsening symptoms, call 911, a crisis hotline, or go to the nearest emergency department for evaluation.   Signed: Oneta Rack, NP 06/26/2016, 9:30 AM

## 2016-06-26 NOTE — Progress Notes (Signed)
Writer spoke with patient 1:1 and she reports that she received anxiety medication earlier and feels that maybe because she if discharging tomorrow has raised her anxiety. Writer encouraged her to use her breathing techniques to help with this. She has been isolative to her room most of the evening other that the brief time she attended group and left early. She requested that her sleep medications be given at 2100. Support given and safety maintained on unit with 15 min checks.

## 2016-06-26 NOTE — [facility] Group Notes (Signed)
[facility] Group Notes:  (Clinical Social Work)   06/26/2016    11:00AM-12:00PM  Summary of Progress/Problems:   The main focus of today's process group was to              1)  discuss the importance of adding appropriate supports to help in recovery goals             2)  identify the patient's current healthy supports             3)  talk about patients' current unhealthy supports and plan how to handle them             4)  Illustrate need for self support  An emphasis was placed on using professional supports as part of the plan to stay well and work on recovery.  The patient expressed full comprehension of the concepts presented.  The patient stated her mother is going to be a good support for her when she leaves the hospital later today.  She did not speak any further any group.  Type of Therapy:  Process Group with Motivational Interviewing  Participation Level:  Minimal  Participation Quality:  Attentive  Affect:  Blunted  Cognitive:  Appropriate  Insight:  Limited  Engagement in Therapy:  Limited  Modes of Intervention:   Education, Support and Processing  Ambrose Mantle, LCSW 06/26/2016    1:21 PM

## 2016-06-26 NOTE — Progress Notes (Signed)
Tina [name] is prepared for DC as she is completing her suicide safety plan. This plan is revewed  By Clinical research associate and pt states verbal understanding and pt is given cc of paperwork ( AVS, SRA, SSP and transition ) and all belongings are returned to pt from her locker. Shecompletes ehr daily assessment and on this she writes she denies SI today and she rates her depression, hopelessness and anxiety " 8/7/6", respectively. She is encouraged to f/u with her PCP tomorrow regarding ehr abnormal cortisol level and she states she understands importance of doing this. DC completed

## 2016-06-26 NOTE — Progress Notes (Signed)
   Date:  06/25/2016 Time: 0930   Group Topic/Focus:  Goals Group:   The focus of this group is to help patients establish daily goals to achieve during treatment and discuss how the patient can incorporate goal setting into their daily lives to aide in recovery.  Participation Level:  Did Not Attend  Participation Quality:    Affect:    Cognitive:    Insight:   Engagement in Group:    Modes of Intervention:    Additional Comments:    Rich Brave 06/26/2016, 8:53 AM

## 2016-06-26 NOTE — [facility] Suicide Risk Assessment (Signed)
[facility] Discharge Suicide Risk Assessment   Principal Problem: Bipolar I disorder, most recent episode depressed [facility]) Discharge Diagnoses:  Patient Active Problem List   Diagnosis Date Noted  . Bipolar I disorder, most recent episode depressed (HCC) [F31.30] 06/19/2016  . MDD (major depressive disorder) [F32.9] 06/18/2016  . Bipolar 1 disorder, manic, full remission (HCC) [F31.74] 06/18/2016  . Migraine [G43.909] 02/20/2015  . Affective psychosis, bipolar (HCC) [F31.9]   . Bipolar disorder, current episode depressed, severe, without psychotic features (HCC) [F31.4]   . Bipolar affective disorder, current episode severe (HCC) [F31.9] 12/02/2014  . Bipolar I disorder, current or most recent episode depressed, in partial remission (HCC) [F31.75] 07/11/2014  . Bipolar 1 disorder, mixed (HCC) [F31.60] 06/27/2014  . Disease of thyroid gland [E07.9] 06/27/2014  . Anxiety [F41.9] 06/27/2014  . 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 Bipolar 1 disorder, depressed, partial remission (HCC) [F31.75] 06/27/2014  . 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 Alcohol use disorder, severe, dependence (HCC) [F10.20] 06/24/2014  . Bipolar disorder, unspecified (HCC) [F31.9] 02/16/2013  . Adrenal insufficiency (HCC) [E27.40]   . Acne [L70.9]   . Hx of Lyme disease [Z86.19]   . 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 Bipolar I disorder, most recent episode (or current) manic (HCC) [F31.10] 08/29/2012  . Hypothyroidism [E03.9] 12/23/2010  . Myalgia [M79.1] 12/23/2010  . Fatigue [R53.83] 12/23/2010  . Unspecified vitamin deficiency [E56.9] 12/23/2010  . Depression [F32.9]     Total Time spent with patient: 45 minutes  Musculoskeletal: Strength & Muscle Tone: within normal limits Gait & Station: normal Patient leans: N/A  Psychiatric Specialty Exam: Review of Systems  Constitutional: Negative.   HENT: Negative.    Eyes: Negative.   Respiratory: Negative.   Cardiovascular: Negative.   Gastrointestinal: Negative.   Genitourinary: Negative.   Musculoskeletal: Negative.   Skin: Negative.   Neurological: Negative.   Endo/Heme/Allergies: Negative.   Psychiatric/Behavioral: Negative for depression, hallucinations, memory loss, substance abuse and suicidal ideas. The patient is not nervous/anxious and does not have insomnia.     Blood pressure (!) 75/48, pulse (!) 103, temperature 98.6 F (37 C), temperature source Oral, resp. rate 16, height 5' 1.25" (1.556 m), weight 46.4 kg (102 lb 5 oz), SpO2 100 %.Body mass index is 19.17 kg/m.  General Appearance: Neatly dressed, pleasant, engaging well and cooperative. Appropriate behavior. Not in any distress. Good relatedness. Not internally stimulated.  Eye Contact::  Good  Speech:  Spontaneous, normal prosody. Normal tone and rate.   Volume:  Normal  Mood:  Euthymic  Affect:  Appropriate and Full Range  Thought Process:  Goal Directed and Linear  Orientation:  Full (Time, Place, and Person)  Thought Content:  No delusional theme. No preoccupation with violent thoughts. No negative ruminations. No obsession.  No hallucination in any modality.   Suicidal Thoughts:  No  Homicidal Thoughts:  No  Memory:  Immediate;   Good Recent;   Good Remote;   Good  Judgement:  Good  Insight:  Good  Psychomotor Activity:  Normal  Concentration:  Good  Recall:  Good  Fund of Knowledge:Good  Language: Good  Akathisia:  No  Handed:    AIMS (if indicated):     Assets:  Communication Skills Desire for Improvement Housing Social Support Vocational/Educational  Sleep:  Number of Hours: 6  Cognition: WNL  ADL's:  Intact   Clinical  Assessment::   46 yo Caucasian female, divorced, lives alone. Background  history of Bipolar disorder. Patient has been off her psychotropic medications since August last year. She reports mood swings with periods of elation. She presented  in company of her family because she has been expressing thoughts of suicide. She reports being depressed lately.  Seen today. Reports that she is feeling better. Her depression has lifted. Rates it as 3/10 today. She is able to enjoy things again. She ahs been sleeping well at night. Good appetite and energy. She has been reading here. Able to focus and remember what she is reading. Her mind is not racing. No suicidal thoughts. No homicidal thoughts. No thoughts of violence. Says she would be staying with her mother for a while. No evidence of psychosis. No anxiety.   Nursing staff reports that patient has been appropriate on the unit. Patient has been interacting well with peers. No behavioral issues. Patient has not voiced any suicidal thoughts. Patient has not been observed to be internally stimulated. Patient has been adherent with treatment recommendations. Patient has been tolerating their medication well.   Patient was discussed at team. Team members feels that patient is back to her baseline level of function. Team agrees with plan to discharge patient today.  Demographic Factors:  Caucasian and Living alone  Loss Factors: NA  Historical Factors: Family history of mental illness or substance abuse  Risk Reduction Factors:   Sense of responsibility to family, Positive social support, Positive therapeutic relationship and Positive coping skills or problem solving skills  Continued Clinical Symptoms:  As above  Cognitive Features That Contribute To Risk:  None    Suicide Risk:  Minimal: No identifiable suicidal ideation.   Patient is not having any thoughts of suicide at this time. Modifiable risk factors targeted during this admission includes bipolar disorder. Demographical and historical risk factors cannot be modified. Patient is now engaging well. Patient is reliable and is future oriented. We have buffered patient's support structures. At this point, patient is at low risk of  suicide. Patient is aware of the effects of psychoactive substances on decision making process. Patient has been provided with emergency contacts. Patient acknowledges to use resources provided if unforseen circumstances changes their current risk stratification.   Follow-up Information    [facility] Follow up on 06/29/2016.   Specialty:  Behavioral Health Why:  Appt on Wednesday 4/18 at 10:00AM for initial appt with counselor and assessment for medication management. Please arrive 15-20 minutes early and bring medicaid card with you to this appt. Thank you.  Contact information: [address] [place] [address] [phone]           Plan Of Care/Follow-up recommendations:  1. Continue current psychotropic medications 2. Mental health follow up as arranged.  3. Discharge in care of her family   Georgiann Cocker, MD 06/26/2016, 9:39 AM

## 2016-07-08 ENCOUNTER — Encounter (HOSPITAL_COMMUNITY): Payer: Self-pay

## 2016-07-08 ENCOUNTER — Inpatient Hospital Stay (HOSPITAL_COMMUNITY)
Admission: EM | Admit: 2016-07-08 | Discharge: 2016-07-12 | DRG: 917 | Disposition: A | Discharge status: Other Acute Inpatient Hospital | Payer: Medicaid Other | Attending: Internal Medicine | Admitting: Internal Medicine

## 2016-07-08 ENCOUNTER — Emergency Department (HOSPITAL_COMMUNITY): Payer: Medicaid Other

## 2016-07-08 DIAGNOSIS — D72829 Elevated white blood cell count, unspecified: Secondary | ICD-10-CM | POA: Diagnosis present

## 2016-07-08 DIAGNOSIS — F1021 Alcohol dependence, in remission: Secondary | ICD-10-CM | POA: Diagnosis present

## 2016-07-08 DIAGNOSIS — T421X4A Poisoning by iminostilbenes, undetermined, initial encounter: Secondary | ICD-10-CM

## 2016-07-08 DIAGNOSIS — F1020 Alcohol dependence, uncomplicated: Secondary | ICD-10-CM | POA: Diagnosis present

## 2016-07-08 DIAGNOSIS — E878 Other disorders of electrolyte and fluid balance, not elsewhere classified: Secondary | ICD-10-CM

## 2016-07-08 DIAGNOSIS — R52 Pain, unspecified: Secondary | ICD-10-CM

## 2016-07-08 DIAGNOSIS — E039 Hypothyroidism, unspecified: Secondary | ICD-10-CM | POA: Diagnosis present

## 2016-07-08 DIAGNOSIS — T421X1A Poisoning by iminostilbenes, accidental (unintentional), initial encounter: Secondary | ICD-10-CM | POA: Diagnosis present

## 2016-07-08 DIAGNOSIS — Z79899 Other long term (current) drug therapy: Secondary | ICD-10-CM

## 2016-07-08 DIAGNOSIS — R778 Other specified abnormalities of plasma proteins: Secondary | ICD-10-CM | POA: Diagnosis present

## 2016-07-08 DIAGNOSIS — F3160 Bipolar disorder, current episode mixed, unspecified: Secondary | ICD-10-CM | POA: Diagnosis present

## 2016-07-08 DIAGNOSIS — T421X2A Poisoning by iminostilbenes, intentional self-harm, initial encounter: Principal | ICD-10-CM | POA: Diagnosis present

## 2016-07-08 DIAGNOSIS — F319 Bipolar disorder, unspecified: Secondary | ICD-10-CM | POA: Diagnosis present

## 2016-07-08 DIAGNOSIS — F3163 Bipolar disorder, current episode mixed, severe, without psychotic features: Secondary | ICD-10-CM | POA: Diagnosis present

## 2016-07-08 DIAGNOSIS — I959 Hypotension, unspecified: Secondary | ICD-10-CM | POA: Diagnosis present

## 2016-07-08 DIAGNOSIS — F17210 Nicotine dependence, cigarettes, uncomplicated: Secondary | ICD-10-CM | POA: Diagnosis present

## 2016-07-08 DIAGNOSIS — G92 Toxic encephalopathy: Secondary | ICD-10-CM | POA: Diagnosis present

## 2016-07-08 DIAGNOSIS — E876 Hypokalemia: Secondary | ICD-10-CM | POA: Diagnosis present

## 2016-07-08 DIAGNOSIS — E2740 Unspecified adrenocortical insufficiency: Secondary | ICD-10-CM | POA: Diagnosis present

## 2016-07-08 LAB — CBG MONITORING, ED: GLUCOSE-CAPILLARY: 106 mg/dL — AB (ref 65–99)

## 2016-07-08 LAB — I-STAT CHEM 8, ED
BUN: 9 mg/dL (ref 6–20)
CREATININE: 0.4 mg/dL — AB (ref 0.44–1.00)
Calcium, Ion: 1.01 mmol/L — ABNORMAL LOW (ref 1.15–1.40)
Chloride: 107 mmol/L (ref 101–111)
GLUCOSE: 114 mg/dL — AB (ref 65–99)
HEMATOCRIT: 39 % (ref 36.0–46.0)
Hemoglobin: 13.3 g/dL (ref 12.0–15.0)
POTASSIUM: 3.4 mmol/L — AB (ref 3.5–5.1)
Sodium: 141 mmol/L (ref 135–145)
TCO2: 22 mmol/L (ref 0–100)

## 2016-07-08 MED ORDER — SODIUM CHLORIDE 0.9 % IV BOLUS (SEPSIS)
1000.0000 mL | Freq: Once | INTRAVENOUS | Status: AC
  Administered 2016-07-09: 1000 mL via INTRAVENOUS

## 2016-07-08 NOTE — ED Triage Notes (Addendum)
From home, called out for AMS, pt responsive to verbal, pt not responding to questions appropriately with muffled speech. Pt unable to participate in stroke assessment. Pt has significant hx of psych problems. LSN 1030 yesterday morning. Pt found laying in bed incontinent. VS 114/70, 99% on RA, HR 114 sinus tach, cbg 106, RR 16. Pupils 5mm and sluggish.

## 2016-07-08 NOTE — ED Notes (Signed)
Pt noted to have tampon in. This RN removed tampon per MD request. Witnessed by Leonard Schwartz, EMT.

## 2016-07-08 NOTE — ED Provider Notes (Signed)
[facility]-EMERGENCY DEPT Provider Note   CSN: [id number] Arrival date & time: 07/08/16  2317  By signing my name below, I, Tina [name], attest that this documentation has been prepared under the direction and in the presence of Pricilla Loveless, MD . Electronically Signed: Freida [name], Scribe. 07/08/2016. 11:36 PM.  History   Chief Complaint Chief Complaint  Patient presents with  . Altered Mental Status   LEVEL 5 CAVEAT DUE TO AMS   The history is provided by the EMS personnel. No language interpreter was used.    HPI Comments:  Tina [name] is a 46 y.o. female who presents to the Emergency Department via EMS from home for AMS. Pt was last seen normal yesterday AM ~1030. Pt with muffled/slurred speech with EMS; she was found in bed incontinent by family.  She has a significant psych history including bipolar disorder, anxiety, depression, and alcohol dependence/illicit drug use. Pt was seen in the ED on 06/18/2016 for SI.   Past Medical History:  Diagnosis Date  . Acne   . Adrenal insufficiency (HCC)   . Bipolar 2 disorder (HCC)   . Cellulitis   . Depression   . Genital warts   . Headache   . History of chicken pox   . Hx of Lyme disease   . Hypothyroidism     Patient Active Problem List   Diagnosis Date Noted  . Tegretol toxicity 07/09/2016  . Electrolyte imbalance 07/09/2016  . Bipolar I disorder, most recent episode depressed (HCC) 06/19/2016  . MDD (major depressive disorder) 06/18/2016  . Bipolar 1 disorder, manic, full remission (HCC) 06/18/2016  . Migraine 02/20/2015  . Affective psychosis, bipolar (HCC)   . Bipolar disorder, current episode depressed, severe, without psychotic features (HCC)   . Bipolar affective disorder, current episode severe (HCC) 12/02/2014  . Bipolar I disorder, current or most recent episode depressed, in partial remission (HCC) 07/11/2014  . Bipolar 1 disorder, mixed (HCC) 06/27/2014  . Disease of thyroid gland 06/27/2014  .  Anxiety 06/27/2014  . Opiate misuse 06/27/2014  . Cannabis dependence without physiological dependence (HCC) 06/27/2014  . Bipolar 1 disorder, depressed, partial remission (HCC) 06/27/2014  . 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 Alcohol use disorder, severe, dependence (HCC) 06/24/2014  . Bipolar disorder, unspecified (HCC) 02/16/2013  . Adrenal insufficiency (HCC)   . Acne   . Hx of Lyme disease   . 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 Bipolar I disorder, most recent episode (or current) manic (HCC) 08/29/2012  . Hypothyroidism 12/23/2010  . Myalgia 12/23/2010  . Fatigue 12/23/2010  . Unspecified vitamin deficiency 12/23/2010  . Depression     Past Surgical History:  Procedure Laterality Date  . CESAREAN SECTION  2003, 2007, 2011  . INGUINAL HERNIA REPAIR Bilateral   . TONSILLECTOMY    . UMBILICAL HERNIA REPAIR      OB History    No data available       Home Medications    Prior to Admission medications   Medication Sig Start Date End Date Taking? Authorizing Provider  hydrOXYzine (ATARAX/VISTARIL) 50 MG tablet Take 1 tablet (50 mg total) by mouth at bedtime as needed for anxiety (sleep). 06/26/16  Yes Oneta Rack, NP  lamoTRIgine (LAMICTAL) 25 MG tablet Take 1 tablet (25 mg total) by mouth daily. 06/26/16  Yes Oneta Rack, NP  temazepam (RESTORIL) 30 MG capsule Take 15-30 mg by mouth at bedtime as needed for sleep.   Yes Historical Provider, MD  thyroid (ARMOUR) 90  MG tablet Take 1 tablet (90 mg total) by mouth daily before breakfast. 06/26/16  Yes Oneta Rack, NP  traZODone (DESYREL) 150 MG tablet Take 0.5 tablets (75 mg total) by mouth at bedtime. 06/26/16  Yes Oneta Rack, NP  venlafaxine XR (EFFEXOR-XR) 75 MG 24 hr capsule Take 1 capsule (75 mg total) by mouth daily with breakfast. 06/26/16  Yes Oneta Rack, NP  metroNIDAZOLE (FLAGYL) 500 MG tablet Take 1 tablet (500 mg total) by mouth every 12 (twelve) hours. 06/26/16   Oneta Rack, NP  midodrine  (PROAMATINE) 5 MG tablet Take 1 tablet (5 mg total) by mouth 2 (two) times daily with a meal. 06/26/16   Oneta Rack, NP    Family History Family History  Problem Relation Age of Onset  . Other Mother     PAD  . Heart disease Other     Grandparent  . Alcohol abuse Paternal Grandmother   . Mental retardation Paternal Grandmother   . Arthritis Maternal Grandfather   . Heart disease Maternal Grandfather     Social History Social History  Substance Use Topics  . Smoking status: Current Every Day Smoker    Packs/day: 1.50    Years: 29.00    Types: Cigarettes  . Smokeless tobacco: Never Used  . Alcohol use Yes     Comment: social drinker-few drinks a week      Allergies   Stadol [butorphanol]; Vancomycin; Penicillins; and Sulfa antibiotics   Review of Systems Review of Systems  Unable to perform ROS: Mental status change     Physical Exam Updated Vital Signs BP 116/81   Pulse 97   Temp 97.5 F (36.4 C) (Oral)   Resp 15   Ht  (1.575 m)   Wt 102 lb (46.3 kg)   SpO2 100%   BMI 18.66 kg/m   Physical Exam  Constitutional: She appears well-developed and well-nourished.  HENT:  Head: Normocephalic and atraumatic.  Right Ear: External ear normal.  Left Ear: External ear normal.  Nose: Nose normal.  Eyes: Right eye exhibits no discharge. Left eye exhibits no discharge.  Pupils midsized and minimally reactive   Cardiovascular: Regular rhythm and normal heart sounds.  Tachycardia present.   Pulmonary/Chest: Effort normal and breath sounds normal.  Abdominal: Soft. There is no tenderness.  Musculoskeletal:  Pt appears to have pain with movement of left thigh/hip  Neurological: She is alert.  Alert but only moans. Responds to painful stimuli and seems to try and talk but her words are not understandable Moves all 4 extremities but left side appears weaker  Skin: Skin is warm and dry.  Nursing note and vitals reviewed.    ED Treatments / Results    DIAGNOSTIC STUDIES:  Oxygen Saturation is 99% on RA, normal by my interpretation.     Labs (all labs ordered are listed, but only abnormal results are displayed) Labs Reviewed  COMPREHENSIVE METABOLIC PANEL - Abnormal; Notable for the following:       Result Value   Glucose, Bld 140 (*)    AST 61 (*)    All other components within normal limits  URINALYSIS, ROUTINE W REFLEX MICROSCOPIC - Abnormal; Notable for the following:    Hgb urine dipstick SMALL (*)    Bacteria, UA RARE (*)    Squamous Epithelial / LPF 0-5 (*)    All other components within normal limits  ACETAMINOPHEN LEVEL - Abnormal; Notable for the following:    Acetaminophen (Tylenol), Serum <  10 (*)    All other components within normal limits  CARBAMAZEPINE LEVEL, TOTAL - Abnormal; Notable for the following:    Carbamazepine Lvl 19.5 (*)    All other components within normal limits  TSH - Abnormal; Notable for the following:    TSH 0.010 (*)    All other components within normal limits  CARBAMAZEPINE LEVEL, TOTAL - Abnormal; Notable for the following:    Carbamazepine Lvl 16.6 (*)    All other components within normal limits  BASIC METABOLIC PANEL - Abnormal; Notable for the following:    Calcium 8.5 (*)    All other components within normal limits  CBC - Abnormal; Notable for the following:    WBC 11.0 (*)    RBC 3.77 (*)    All other components within normal limits  MAGNESIUM - Abnormal; Notable for the following:    Magnesium 1.6 (*)    All other components within normal limits  CBG MONITORING, ED - Abnormal; Notable for the following:    Glucose-Capillary 106 (*)    All other components within normal limits  I-STAT CHEM 8, ED - Abnormal; Notable for the following:    Potassium 3.4 (*)    Creatinine, Ser 0.40 (*)    Glucose, Bld 114 (*)    Calcium, Ion 1.01 (*)    All other components within normal limits  MRSA PCR SCREENING  ETHANOL  SALICYLATE LEVEL  T4, FREE  CBC WITH DIFFERENTIAL/PLATELET   RAPID URINE DRUG SCREEN, HOSP PERFORMED  CARBAMAZEPINE LEVEL, TOTAL  CARBAMAZEPINE LEVEL, TOTAL  I-STAT BETA HCG BLOOD, ED ([facility], [facility], [facility] ONLY)    EKG  EKG Interpretation  Date/Time:  Friday July 08 2016 23:28:29 EDT Ventricular Rate:  117 PR Interval:    QRS Duration: 71 QT Interval:  295 QTC Calculation: 412 R Axis:   65 Text Interpretation:  Sinus tachycardia LAE, consider biatrial enlargement Nonspecific T abnormalities, diffuse leads rate is faster compared to jan 2018 Confirmed by Shenetta Schnackenberg MD, Khaleel Beckom [phone]) on 07/08/2016 11:36:31 PM       EKG Interpretation  Date/Time:  Saturday July 09 2016 03:46:03 EDT Ventricular Rate:  87 PR Interval:    QRS Duration: 93 QT Interval:  383 QTC Calculation: 461 R Axis:   70 Text Interpretation:  Sinus rhythm Probable left atrial enlargement nonspecific T waves no longer present Confirmed by Carlin Mamone MD, Rick Warnick [phone]) on 07/09/2016 4:53:49 AM       Radiology Ct Head Wo Contrast  Result Date: 07/09/2016 CLINICAL DATA:  Altered mental status, incontinent. History of bipolar disorder. EXAM: CT HEAD WITHOUT CONTRAST TECHNIQUE: Contiguous axial images were obtained from the base of the skull through the vertex without intravenous contrast. COMPARISON:  CT HEAD Jul 24, 2011 FINDINGS: BRAIN: No intraparenchymal hemorrhage, mass effect nor midline shift. The ventricles and sulci are normal. No acute large vascular territory infarcts. Bilateral inferior basal ganglia perivascular spaces. No abnormal extra-axial fluid collections. Basal cisterns are patent. VASCULAR: Unremarkable. SKULL/SOFT TISSUES: No skull fracture. No significant soft tissue swelling. ORBITS/SINUSES: The included ocular globes and orbital contents are normal.Frothy secretions RIGHT sphenoid sinus Set, mild paranasal sinus mucosal thickening. Mastoid air cells are well aerated. OTHER: None. IMPRESSION: Normal CT HEAD. Electronically Signed   By: Awilda Metro M.D.   On:  07/09/2016 01:31   Dg Pelvis Portable  Result Date: 07/09/2016 CLINICAL DATA:  46 year old female with left hip pain. EXAM: PORTABLE PELVIS 1-2 VIEWS COMPARISON:  Left hip radiograph dated 07/08/2016 FINDINGS: There is no  evidence of pelvic fracture or diastasis. No pelvic bone lesions are seen. A tubal ligation clip noted over the right hemipelvis. The soft tissues appear unremarkable. IMPRESSION: No acute/traumatic osseous pathology. Electronically Signed   By: Elgie Collard M.D.   On: 07/09/2016 00:28   Dg Chest Portable 1 View  Result Date: 07/09/2016 CLINICAL DATA:  46 year old female with altered mental status EXAM: PORTABLE CHEST 1 VIEW COMPARISON:  Chest radiograph dated 03/25/2016 FINDINGS: The heart size and mediastinal contours are within normal limits. Both lungs are clear. The visualized skeletal structures are unremarkable. IMPRESSION: No active disease. Electronically Signed   By: Elgie Collard M.D.   On: 07/09/2016 00:25   Dg Femur Portable Min 2 Views Left  Result Date: 07/09/2016 CLINICAL DATA:  46 year old female with left leg pain. EXAM: LEFT FEMUR PORTABLE 2 VIEWS COMPARISON:  Pelvic radiograph dated 07/08/2016 FINDINGS: There is no evidence of fracture or other focal bone lesions. Soft tissues are unremarkable. IMPRESSION: Negative. Electronically Signed   By: Elgie Collard M.D.   On: 07/09/2016 00:27    Procedures Procedures (including critical care time)  CRITICAL CARE Performed by: Pricilla Loveless, MD Total critical care time: 35 minutes Critical care time was exclusive of separately billable procedures and treating other patients. Critical care was necessary to treat or prevent imminent or life-threatening deterioration. Critical care was time spent personally by me on the following activities: development of treatment plan with patient and/or surrogate as well as nursing, discussions with consultants, evaluation of patient's response to treatment, examination  of patient, obtaining history from patient or surrogate, ordering and performing treatments and interventions, ordering and review of laboratory studies, ordering and review of radiographic studies, pulse oximetry and re-evaluation of patient's condition.   Medications Ordered in ED Medications  enoxaparin (LOVENOX) injection 40 mg (not administered)  acetaminophen (TYLENOL) tablet 650 mg (not administered)    Or  acetaminophen (TYLENOL) suppository 650 mg (not administered)  LORazepam (ATIVAN) injection 2 mg (not administered)  potassium chloride 30 mEq in sodium chloride 0.9 % 265 mL (KCL MULTIRUN) IVPB (30 mEq Intravenous New Bag/Given 07/09/16 0618)  pantoprazole (PROTONIX) injection 40 mg (not administered)  magnesium sulfate IVPB 4 g 100 mL (not administered)  dextrose 5 %-0.45 % sodium chloride infusion (not administered)  thiamine (B-1) injection 100 mg (not administered)  folic acid injection 1 mg (not administered)  sodium chloride 0.9 % bolus 1,000 mL (0 mLs Intravenous Stopped 07/09/16 0123)  0.9 %  sodium chloride infusion ( Intravenous Stopped 07/09/16 0507)  sodium chloride 0.9 % bolus 1,000 mL (0 mLs Intravenous Stopped 07/09/16 0507)     Initial Impression / Assessment and Plan / ED Course  I have reviewed the triage vital signs and the nursing notes.  Pertinent labs & imaging results that were available during my care of the patient were reviewed by me and considered in my medical decision making (see chart for details).  Clinical Course as of Jul 09 801  Sat Jul 09, 2016  0317 D/w [place], ICU will admit. Asks for renal consult for possible CRRT  [SG]    Clinical Course User Index [SG] Pricilla Loveless, MD    Patient's mental status has gotten a little bit worse but she is still maintaining her airway. She is less responsive. However she has an intact gag reflex and when a tongue blade is placed she starts biting on it. However she does not open her eyes to  commands or follow commands. I do not think  she needs immediate airway protection this time. However she needs to be closely monitored as the Tegretol overdose is of unclear time course and this could be either worsening or starting to improve. I discussed with Dr. Allena Katz of nephrology, at this point there is no indication for CRRT or dialysis. However if she were to have seizures or mental status was to worsen then he would need to be consult back. Blood pressure has improved with fluids. She will need a psychiatry consult after she awakens and improves.   Final Clinical Impressions(s) / ED Diagnoses   Final diagnoses:  Carbamazepine overdose of undetermined intent, initial encounter    New Prescriptions Current Discharge Medication List     I personally performed the services described in this documentation, which was scribed in my presence. The recorded information has been reviewed and is accurate.     Pricilla Loveless, MD 07/09/16 [phone]

## 2016-07-09 ENCOUNTER — Emergency Department (HOSPITAL_COMMUNITY): Payer: Medicaid Other

## 2016-07-09 DIAGNOSIS — D72829 Elevated white blood cell count, unspecified: Secondary | ICD-10-CM | POA: Diagnosis present

## 2016-07-09 DIAGNOSIS — E2740 Unspecified adrenocortical insufficiency: Secondary | ICD-10-CM | POA: Diagnosis not present

## 2016-07-09 DIAGNOSIS — E876 Hypokalemia: Secondary | ICD-10-CM | POA: Diagnosis present

## 2016-07-09 DIAGNOSIS — F314 Bipolar disorder, current episode depressed, severe, without psychotic features: Secondary | ICD-10-CM | POA: Diagnosis not present

## 2016-07-09 DIAGNOSIS — R4182 Altered mental status, unspecified: Secondary | ICD-10-CM | POA: Diagnosis not present

## 2016-07-09 DIAGNOSIS — F3130 Bipolar disorder, current episode depressed, mild or moderate severity, unspecified: Secondary | ICD-10-CM | POA: Diagnosis not present

## 2016-07-09 DIAGNOSIS — F1020 Alcohol dependence, uncomplicated: Secondary | ICD-10-CM | POA: Diagnosis present

## 2016-07-09 DIAGNOSIS — F339 Major depressive disorder, recurrent, unspecified: Secondary | ICD-10-CM | POA: Diagnosis not present

## 2016-07-09 DIAGNOSIS — R778 Other specified abnormalities of plasma proteins: Secondary | ICD-10-CM | POA: Diagnosis present

## 2016-07-09 DIAGNOSIS — E039 Hypothyroidism, unspecified: Secondary | ICD-10-CM | POA: Diagnosis not present

## 2016-07-09 DIAGNOSIS — T421X1A Poisoning by iminostilbenes, accidental (unintentional), initial encounter: Secondary | ICD-10-CM | POA: Diagnosis present

## 2016-07-09 DIAGNOSIS — F1099 Alcohol use, unspecified with unspecified alcohol-induced disorder: Secondary | ICD-10-CM | POA: Diagnosis not present

## 2016-07-09 DIAGNOSIS — Z79899 Other long term (current) drug therapy: Secondary | ICD-10-CM | POA: Diagnosis not present

## 2016-07-09 DIAGNOSIS — Z810 Family history of intellectual disabilities: Secondary | ICD-10-CM | POA: Diagnosis not present

## 2016-07-09 DIAGNOSIS — F3163 Bipolar disorder, current episode mixed, severe, without psychotic features: Secondary | ICD-10-CM | POA: Diagnosis present

## 2016-07-09 DIAGNOSIS — F3160 Bipolar disorder, current episode mixed, unspecified: Secondary | ICD-10-CM | POA: Diagnosis not present

## 2016-07-09 DIAGNOSIS — T1491XA Suicide attempt, initial encounter: Secondary | ICD-10-CM | POA: Diagnosis not present

## 2016-07-09 DIAGNOSIS — F17210 Nicotine dependence, cigarettes, uncomplicated: Secondary | ICD-10-CM | POA: Diagnosis not present

## 2016-07-09 DIAGNOSIS — Z811 Family history of alcohol abuse and dependence: Secondary | ICD-10-CM | POA: Diagnosis not present

## 2016-07-09 DIAGNOSIS — T421X2D Poisoning by iminostilbenes, intentional self-harm, subsequent encounter: Secondary | ICD-10-CM | POA: Diagnosis not present

## 2016-07-09 DIAGNOSIS — E878 Other disorders of electrolyte and fluid balance, not elsewhere classified: Secondary | ICD-10-CM

## 2016-07-09 DIAGNOSIS — I959 Hypotension, unspecified: Secondary | ICD-10-CM | POA: Diagnosis present

## 2016-07-09 DIAGNOSIS — A6920 Lyme disease, unspecified: Secondary | ICD-10-CM | POA: Diagnosis not present

## 2016-07-09 DIAGNOSIS — Z818 Family history of other mental and behavioral disorders: Secondary | ICD-10-CM | POA: Diagnosis not present

## 2016-07-09 DIAGNOSIS — R5382 Chronic fatigue, unspecified: Secondary | ICD-10-CM | POA: Diagnosis not present

## 2016-07-09 DIAGNOSIS — G92 Toxic encephalopathy: Secondary | ICD-10-CM | POA: Diagnosis present

## 2016-07-09 DIAGNOSIS — R21 Rash and other nonspecific skin eruption: Secondary | ICD-10-CM | POA: Diagnosis not present

## 2016-07-09 DIAGNOSIS — T421X4A Poisoning by iminostilbenes, undetermined, initial encounter: Secondary | ICD-10-CM | POA: Diagnosis not present

## 2016-07-09 DIAGNOSIS — T421X2A Poisoning by iminostilbenes, intentional self-harm, initial encounter: Secondary | ICD-10-CM | POA: Diagnosis present

## 2016-07-09 LAB — RAPID URINE DRUG SCREEN, HOSP PERFORMED
Amphetamines: NOT DETECTED
BARBITURATES: NOT DETECTED
Benzodiazepines: POSITIVE — AB
Cocaine: NOT DETECTED
Opiates: NOT DETECTED
Tetrahydrocannabinol: NOT DETECTED

## 2016-07-09 LAB — URINALYSIS, ROUTINE W REFLEX MICROSCOPIC
BILIRUBIN URINE: NEGATIVE
Glucose, UA: NEGATIVE mg/dL
Ketones, ur: NEGATIVE mg/dL
LEUKOCYTES UA: NEGATIVE
NITRITE: NEGATIVE
PH: 5 (ref 5.0–8.0)
Protein, ur: NEGATIVE mg/dL
SPECIFIC GRAVITY, URINE: 1.014 (ref 1.005–1.030)

## 2016-07-09 LAB — COMPREHENSIVE METABOLIC PANEL
ALBUMIN: 3.8 g/dL (ref 3.5–5.0)
ALK PHOS: 54 U/L (ref 38–126)
ALT: 28 U/L (ref 14–54)
ANION GAP: 10 (ref 5–15)
AST: 61 U/L — AB (ref 15–41)
BILIRUBIN TOTAL: 0.6 mg/dL (ref 0.3–1.2)
BUN: 10 mg/dL (ref 6–20)
CO2: 25 mmol/L (ref 22–32)
Calcium: 9.4 mg/dL (ref 8.9–10.3)
Chloride: 103 mmol/L (ref 101–111)
Creatinine, Ser: 0.75 mg/dL (ref 0.44–1.00)
GFR calc Af Amer: >60 mL/min (ref >60)
GFR calc non Af Amer: >60 mL/min (ref >60)
GLUCOSE: 140 mg/dL — AB (ref 65–99)
POTASSIUM: 3.9 mmol/L (ref 3.5–5.1)
SODIUM: 138 mmol/L (ref 135–145)
TOTAL PROTEIN: 6.5 g/dL (ref 6.5–8.1)

## 2016-07-09 LAB — I-STAT BETA HCG BLOOD, ED (MC, WL, AP ONLY): I-stat hCG, quantitative: <5 m[IU]/mL (ref <5)

## 2016-07-09 LAB — BASIC METABOLIC PANEL
Anion gap: 10 (ref 5–15)
BUN: 8 mg/dL (ref 6–20)
CALCIUM: 8.5 mg/dL — AB (ref 8.9–10.3)
CO2: 22 mmol/L (ref 22–32)
CREATININE: 0.57 mg/dL (ref 0.44–1.00)
Chloride: 109 mmol/L (ref 101–111)
GFR calc Af Amer: >60 mL/min (ref >60)
GFR calc non Af Amer: >60 mL/min (ref >60)
GLUCOSE: 90 mg/dL (ref 65–99)
Potassium: 4.1 mmol/L (ref 3.5–5.1)
Sodium: 141 mmol/L (ref 135–145)

## 2016-07-09 LAB — CARBAMAZEPINE LEVEL, TOTAL
CARBAMAZEPINE LVL: 19.5 ug/mL — AB (ref 4.0–12.0)
CARBAMAZEPINE LVL: 12.9 ug/mL — AB (ref 4.0–12.0)
Carbamazepine Lvl: 16.6 ug/mL (ref 4.0–12.0)
Carbamazepine Lvl: 16.3 ug/mL (ref 4.0–12.0)

## 2016-07-09 LAB — CBC
HCT: 36.3 % (ref 36.0–46.0)
Hemoglobin: 12.5 g/dL (ref 12.0–15.0)
MCH: 33.2 pg (ref 26.0–34.0)
MCHC: 34.4 g/dL (ref 30.0–36.0)
MCV: 96.3 fL (ref 78.0–100.0)
PLATELETS: 172 10*3/uL (ref 150–400)
RBC: 3.77 MIL/uL — ABNORMAL LOW (ref 3.87–5.11)
RDW: 12.4 % (ref 11.5–15.5)
WBC: 11 10*3/uL — ABNORMAL HIGH (ref 4.0–10.5)

## 2016-07-09 LAB — MRSA PCR SCREENING: MRSA by PCR: NEGATIVE

## 2016-07-09 LAB — ETHANOL: Alcohol, Ethyl (B): <5 mg/dL (ref <5)

## 2016-07-09 LAB — ACETAMINOPHEN LEVEL: Acetaminophen (Tylenol), Serum: <10 ug/mL — ABNORMAL LOW (ref 10–30)

## 2016-07-09 LAB — TSH: TSH: 0.01 u[IU]/mL — AB (ref 0.350–4.500)

## 2016-07-09 LAB — MAGNESIUM: MAGNESIUM: 1.6 mg/dL — AB (ref 1.7–2.4)

## 2016-07-09 LAB — T4, FREE: Free T4: 0.69 ng/dL (ref 0.61–1.12)

## 2016-07-09 LAB — SALICYLATE LEVEL: Salicylate Lvl: <7 mg/dL (ref 2.8–30.0)

## 2016-07-09 MED ORDER — MAGNESIUM SULFATE 4 GM/100ML IV SOLN
4.0000 g | Freq: Once | INTRAVENOUS | Status: AC
  Administered 2016-07-09: 4 g via INTRAVENOUS
  Filled 2016-07-09: qty 100

## 2016-07-09 MED ORDER — ACETAMINOPHEN 650 MG RE SUPP: 650.0000 mg | Freq: Four times a day (QID) | RECTAL | Status: DC | PRN

## 2016-07-09 MED ORDER — WHITE PETROLATUM GEL
Status: AC
  Administered 2016-07-09: 1
  Filled 2016-07-09: qty 1

## 2016-07-09 MED ORDER — ACETAMINOPHEN 325 MG PO TABS
650.0000 mg | ORAL_TABLET | Freq: Four times a day (QID) | ORAL | Status: DC | PRN
  Administered 2016-07-10 – 2016-07-12 (×3): 650 mg via ORAL
  Filled 2016-07-09 (×3): qty 2

## 2016-07-09 MED ORDER — SODIUM CHLORIDE 0.9 % IV SOLN
INTRAVENOUS | Status: DC
  Administered 2016-07-09: 06:00:00 via INTRAVENOUS

## 2016-07-09 MED ORDER — SODIUM CHLORIDE 0.9 % IV SOLN
Freq: Once | INTRAVENOUS | Status: AC
  Administered 2016-07-09: 02:00:00 via INTRAVENOUS

## 2016-07-09 MED ORDER — FOLIC ACID 5 MG/ML IJ SOLN
1.0000 mg | Freq: Every day | INTRAMUSCULAR | Status: DC
  Administered 2016-07-09: 1 mg via INTRAVENOUS
  Filled 2016-07-09 (×2): qty 0.2

## 2016-07-09 MED ORDER — PANTOPRAZOLE SODIUM 40 MG IV SOLR
40.0000 mg | Freq: Every day | INTRAVENOUS | Status: DC
  Administered 2016-07-09: 40 mg via INTRAVENOUS
  Filled 2016-07-09: qty 40

## 2016-07-09 MED ORDER — SODIUM CHLORIDE 0.9 % IV SOLN
30.0000 meq | Freq: Once | INTRAVENOUS | Status: AC
  Administered 2016-07-09: 30 meq via INTRAVENOUS
  Filled 2016-07-09: qty 15

## 2016-07-09 MED ORDER — THIAMINE HCL 100 MG/ML IJ SOLN
100.0000 mg | Freq: Every day | INTRAMUSCULAR | Status: DC
  Administered 2016-07-09: 100 mg via INTRAVENOUS
  Filled 2016-07-09: qty 1
  Filled 2016-07-09: qty 2

## 2016-07-09 MED ORDER — ENOXAPARIN SODIUM 40 MG/0.4ML ~~LOC~~ SOLN
40.0000 mg | Freq: Every day | SUBCUTANEOUS | Status: DC
  Administered 2016-07-09 – 2016-07-12 (×4): 40 mg via SUBCUTANEOUS
  Filled 2016-07-09 (×4): qty 0.4

## 2016-07-09 MED ORDER — LORAZEPAM 2 MG/ML IJ SOLN: 2.0000 mg | INTRAMUSCULAR | Status: DC | PRN

## 2016-07-09 MED ORDER — DEXTROSE-NACL 5-0.45 % IV SOLN
INTRAVENOUS | Status: DC
  Administered 2016-07-09 – 2016-07-11 (×5): via INTRAVENOUS

## 2016-07-09 MED ORDER — SODIUM CHLORIDE 0.9 % IV BOLUS (SEPSIS)
1000.0000 mL | Freq: Once | INTRAVENOUS | Status: AC
  Administered 2016-07-09: 1000 mL via INTRAVENOUS

## 2016-07-09 NOTE — Evaluation (Signed)
Clinical/Bedside Swallow Evaluation Patient Details  Name: Tina [name] MRN: [id number] Date of Birth: 1970/07/22  Today's Date: 07/09/2016 Time: SLP Start Time (ACUTE ONLY): 0810 SLP Stop Time (ACUTE ONLY): 0820 SLP Time Calculation (min) (ACUTE ONLY): 10 min  Past Medical History:  Past Medical History:  Diagnosis Date  . Acne   . Adrenal insufficiency (HCC)   . Bipolar 2 disorder (HCC)   . Cellulitis   . Depression   . Genital warts   . Headache   . History of chicken pox   . Hx of Lyme disease   . Hypothyroidism    Past Surgical History:  Past Surgical History:  Procedure Laterality Date  . CESAREAN SECTION  2003, 2007, 2011  . INGUINAL HERNIA REPAIR Bilateral   . TONSILLECTOMY    . UMBILICAL HERNIA REPAIR     HPI:  46 y/o F with significant psychiatric history including bipolar 1 with recent manic episode, now in depressive state, history of suicide attempt by cutting, polysubstance abuse including alcohol dependence/illicit drug use who presented from home with parents. She was found in bed incontinent per family with muffled/slurred speech and lethargy. EKG with sinus tachycardia at 117, QT 412 and without any ST segment changes to suggest acute ischemia. CMET wnl except for AST of 61. Carbamazepine level 19.5. CT head without acute process. CXR without active cardiopulmonary disease. Admitted for carbamazepine overdose. Referred for swallowing evaluation.   Assessment / Plan / Recommendation Clinical Impression  Patient presents with mild risk for aspiration given current mentation, fluctuating level of alertness, and reduced attention to PO presentation. Patient is alert, responding to speech, though her responses are confused, rapid, low volume and primarily unintelligible. She clearly states her name, birthdate and location with verbal cues to slow her rate, increase volume. Follows basic commands for oral motor examination which is unremarkable save for reduced  lingual ROM, weak jaw movement which appears cognitively based vs physical deficit. Subtle immediate throat clear with initial sip of thin liquid, however with subsequent trials and repeated challenging with consecutive sips of thin liquid via straw in excess of 3 oz, pt with no further overt signs of aspiration. Pt requires assistance for bolus retrieval, min-mod verbal cues for attention to solid boluses. Recommend initiating dys 2 diet with thin liquids with full supervision and assistance, medications whole with puree. Anticipate advancement of solids as mentation improves. SLP will follow up for tolerance, advancement as appropriate. SLP Visit Diagnosis: Dysphagia, unspecified (R13.10)    Aspiration Risk  Mild aspiration risk    Diet Recommendation Dysphagia 2 (Fine chop);Thin liquid   Liquid Administration via: Cup;Straw Medication Administration: Whole meds with puree Supervision: Full supervision/cueing for compensatory strategies;Staff to assist with self feeding Compensations: Slow rate;Small sips/bites;Minimize environmental distractions Postural Changes: Seated upright at 90 degrees    Other  Recommendations Oral Care Recommendations: Oral care BID   Follow up Recommendations Other (comment) (TBA)      Frequency and Duration min 2x/week  2 weeks       Prognosis Prognosis for Safe Diet Advancement: Good Barriers to Reach Goals: Cognitive deficits      Swallow Study   General Date of Onset: 07/08/16 HPI: 46 y/o F with significant psychiatric history including bipolar 1 with recent manic episode, now in depressive state, history of suicide attempt by cutting, polysubstance abuse including alcohol dependence/illicit drug use who presented from home with parents. She was found in bed incontinent per family with muffled/slurred speech and lethargy. EKG with sinus  tachycardia at 117, QT 412 and without any ST segment changes to suggest acute ischemia. CMET wnl except for AST of  61. Carbamazepine level 19.5. CT head without acute process. CXR without active cardiopulmonary disease. Admitted for carbamazepine overdose. Referred for swallowing evaluation. Type of Study: Bedside Swallow Evaluation Previous Swallow Assessment: none in chart Diet Prior to this Study: NPO Temperature Spikes Noted: No Respiratory Status: Room air History of Recent Intubation: No Behavior/Cognition: Cooperative;Alert;Confused Oral Cavity Assessment: Within Functional Limits Oral Care Completed by SLP: Recent completion by staff Oral Cavity - Dentition: Adequate natural dentition Vision: Impaired for self-feeding Self-Feeding Abilities: Total assist Patient Positioning: Upright in bed Baseline Vocal Quality: Low vocal intensity Volitional Cough: Strong Volitional Swallow: Able to elicit    Oral/Motor/Sensory Function Overall Oral Motor/Sensory Function: Mild impairment Facial ROM: Within Functional Limits Facial Symmetry: Within Functional Limits Facial Strength: Within Functional Limits Facial Sensation: Within Functional Limits Lingual ROM: Reduced right;Reduced left Lingual Symmetry: Within Functional Limits Lingual Strength: Reduced Lingual Sensation: Within Functional Limits Velum: Within Functional Limits Mandible: Impaired   Ice Chips Ice chips: Within functional limits   Thin Liquid Thin Liquid: Within functional limits Presentation: Cup;Spoon;Straw    Nectar Thick Nectar Thick Liquid: Not tested   Honey Thick Honey Thick Liquid: Not tested   Puree Puree: Impaired Presentation: Spoon Oral Phase Impairments: Reduced labial seal;Poor awareness of bolus Oral Phase Functional Implications: Oral holding   Solid   GO   Rondel Baton, [place] CF-SLP Speech-Language Pathologist [phone] Solid: Impaired Oral Phase Impairments: Reduced labial seal;Poor awareness of bolus;Impaired mastication Oral Phase Functional Implications: Prolonged oral transit;Oral residue;Impaired  mastication        Tina [name] 07/09/2016,8:43 AM

## 2016-07-09 NOTE — ED Notes (Addendum)
Pt back from CT. Pt responsive to pain only, still not following commands. VSS. Family at bedside.

## 2016-07-09 NOTE — H&P (Signed)
Name: Tina [name] MRN: [id number] DOB: 1970/08/25    LOS: 0  PCCM ADMISSION NOTE  History of Present Illness:  46 y/o F with significant psychiatric history including bipolar 1 with recent manic episode, now in depressive state, history of suicide attempt by cutting, polysubstance abuse including alcohol dependence/illicit drug use who presents from home with parents. Family went to check on patient this evening as they had not heard from her all day. She was found in bed incontinent per family with muffled/slurred speech and lethargy. They last saw her normal morning of 4/26 when patient was complaining of significant depression secondary to spending all of her savings in her recent manic episode. She did not express any suicidal ideations per family.   In the emergency department vital signs were stable however she was only responsive to painful stimuli and did not follow commands per EDP. She occasionally had garbled speech. EKG with sinus tachycardia at 117, QT 412 and without any ST segment changes to suggest acute ischemia. CMET wnl except for AST of 61. Carbamazepine level 19.5. CT head without acute process. CXR without active cardiopulmonary disease. PCCM was asked to admit for carbamazepine overdose.   Lines / Drains: PIV x2  Tests / Events: 4/7-4/15: Admitted for suicidal ideation 4/26: Last seen normal at 10:30 am. Depressed, but did not express suicidal ideations 4/27: found with AMS by family. Carbamazepine level 19.5. CT head without acute process.   The patient unable to provide history secondary to AMS. History  obtained for available medical records and family who were present at bedside.    Past Medical History:  Diagnosis Date  . Acne   . Adrenal insufficiency (HCC)   . Bipolar 2 disorder (HCC)   . Cellulitis   . Depression   . Genital warts   . Headache   . History of chicken pox   . Hx of Lyme disease   . Hypothyroidism    Past Surgical History:   Procedure Laterality Date  . CESAREAN SECTION  2003, 2007, 2011  . INGUINAL HERNIA REPAIR Bilateral   . TONSILLECTOMY    . UMBILICAL HERNIA REPAIR     Prior to Admission medications   Medication Sig Start Date End Date Taking? Authorizing Provider  hydrOXYzine (ATARAX/VISTARIL) 50 MG tablet Take 1 tablet (50 mg total) by mouth at bedtime as needed for anxiety (sleep). 06/26/16  Yes Oneta Rack, NP  lamoTRIgine (LAMICTAL) 25 MG tablet Take 1 tablet (25 mg total) by mouth daily. 06/26/16  Yes Oneta Rack, NP  temazepam (RESTORIL) 30 MG capsule Take 15-30 mg by mouth at bedtime as needed for sleep.   Yes Historical Provider, MD  thyroid (ARMOUR) 90 MG tablet Take 1 tablet (90 mg total) by mouth daily before breakfast. 06/26/16  Yes Oneta Rack, NP  traZODone (DESYREL) 150 MG tablet Take 0.5 tablets (75 mg total) by mouth at bedtime. 06/26/16  Yes Oneta Rack, NP  venlafaxine XR (EFFEXOR-XR) 75 MG 24 hr capsule Take 1 capsule (75 mg total) by mouth daily with breakfast. 06/26/16  Yes Oneta Rack, NP  metroNIDAZOLE (FLAGYL) 500 MG tablet Take 1 tablet (500 mg total) by mouth every 12 (twelve) hours. 06/26/16   Oneta Rack, NP  midodrine (PROAMATINE) 5 MG tablet Take 1 tablet (5 mg total) by mouth 2 (two) times daily with a meal. 06/26/16   Oneta Rack, NP   Allergies Allergies  Allergen Reactions  . Stadol [Butorphanol] Other (See Comments)  Dependence  . Vancomycin Itching  . Penicillins Hives and Rash  . Sulfa Antibiotics Rash    Family History Family History  Problem Relation Age of Onset  . Other Mother     PAD  . Heart disease Other     Grandparent  . Alcohol abuse Paternal Grandmother   . Mental retardation Paternal Grandmother   . Arthritis Maternal Grandfather   . Heart disease Maternal Grandfather    Social History: Current smoker with a 44 pack-year history. Reportedly drinks alcohol and uses recreational drugs. Lives home alone and able to complete all  ADLs independently. Parents incredibly supportive.   Review Of Systems: 11 points review of systems is negative with an exception of listed in HPI.  Vital Signs: Temp:  [98.1 F (36.7 C)] 98.1 F (36.7 C) (04/27 2355) Pulse Rate:  [84-119] 88 (04/28 0400) Resp:  [13-24] 15 (04/28 0400) BP: (89-134)/(56-90) 105/68 (04/28 0400) SpO2:  [98 %-100 %] 99 % (04/28 0400) Weight:  [102 lb (46.3 kg)] 102 lb (46.3 kg) (04/27 2325) No intake/output data recorded.  Physical Examination: General: Confused caucasian female resting upright in bed. NAD Neuro: Fatigued and appears dazed. Responds to questions with garbled speech. Moves all 4 extremities spontaneously. Able to reposition self in bed on own.  HEENT: Adequate cough reflex, able to clear secretions. Mucous membranes dry. Pupils 5 mm but equal.  Neck: No JVD appreciated.   Cardiovascular: RRR, without MGR. 2+ distal pulses.  Lungs: Occasional rhonchi however cleared with good cough. Normal WOB.   Abdomen: Lower pelvic fullness however abdomen NT.  Musculoskeletal: No BL LE edema appreciated. TTP left trochanteric region Skin: Warm and well perfused. No cyanosis.   Ventilator settings: NA   Labs and Imaging:  Reviewed.  Please refer to the Assessment and Plan section for relevant results.  Assessment and Plan: This is a 46 y/o F admitted with AMS thought to be secondary to carbamazepine toxicity, level 19.5 on admission. Has history of bipolar 1 disorder with recent manic episode, currently in depressive state. Recently admitted for suicidal ideations.   NEUROLOGIC/PSYCHIATRIC ASSESSMENT:   Hx of Bipolar 1 with recent manic episode, currently in depressive state per family Carbamazepine toxicity - unclear how much, when or if intentional. Last seen normal 4/26 Hx of substance abuse PLAN:   Daily carbamazepine level, trend Monitor for worsening mental status, may still require intubation although appears to be improving Seizure  precautions Call psych in AM UDS pending  PULMONARY ASSESSMENT: Currently protecting airway in setting of carbamazepine OD however has potential to develop respiratory failure 2/2 nature of tegretol pharmacokinetics Current tobacco abuse Has never been intubated PLAN:   Monitor in ICU closely. Currently with good cough and clearing secretions.   CARDIOVASCULAR ASSESSMENT: EKG with sinus tach of 117, QT of 412 PLAN:  No prolonged QT now, however carbamazepine OD associated with this Avoid potential QT prolonging meds Consider bicarb if prolonged QT were to develop Tele Follow-up CK EKG daily  RENAL ASSESSMENT:   Cr 0.75 on presentation. Lytes WNL except K 3.3 PLAN:   Replace K Mag level Monitor UOP, carbamazepine has anticholinergic effects which could cause urinary retention  GASTROINTESTINAL ASSESSMENT:   Nutrition PPi ppx PLAN:   SLP eval once AMS clears a bit more Protonix  HEMATOLOGIC ASSESSMENT:   DVT ppx H&H without anemia, CBC pend PLAN:  Lovenox FU CBC  INFECTIOUS ASSESSMENT:  Family denies any recent illness. Pt unable to deny infectious symptoms.  UA without infection, CXR  without consolidation History of disseminated lyme disease PLAN:   Trend fever and WBC curves  ENDOCRINE ASSESSMENT:  Hypothyroidism on Armour thyroid 90 mcg TSH 0.01, Free T4 0.6 Hx of adrenal insufficiency, BP stable PLAN:   Continue Armour thyroid  Best practices / Disposition: -->ICU status under PCCM -->full code  -->Lovenox for DVT Px -->Protonix for GI Px -->diet NPO for now, SLP eval prior to PO -->family updated at bedside  The patient is critically ill with multiple organ systems failure and requires high complexity decision making for assessment and support, frequent evaluation and titration of therapies, application of advanced monitoring technologies and extensive interpretation of multiple databases. Critical Care Time devoted to patient care services  described in this note is 41 minutes.  Gabrien Mentink, D.O.  07/09/2016, 4:38 AM

## 2016-07-09 NOTE — Progress Notes (Signed)
CRITICAL VALUE ALERT  Critical value received: Tegretol level 16.6   Date of notification:   07/09/16  Time of notification:  1149  Critical value read back: yes  Nurse who received alert:  Burnard Bunting, RN  MD notified: Dr Marchelle Gearing  Time of first page: 1150  Time MD responded:  1152

## 2016-07-09 NOTE — Assessment & Plan Note (Signed)
Need to call psych - paged by Environmental education officer at bedside

## 2016-07-09 NOTE — Assessment & Plan Note (Signed)
Start home syntrhoid 07/10/16 Check TSH

## 2016-07-09 NOTE — ED Notes (Signed)
Dr Criss Alvine in room to speak with family about treatment plan.

## 2016-07-09 NOTE — ED Notes (Signed)
Family at bedside. 

## 2016-07-09 NOTE — Assessment & Plan Note (Signed)
Monitor for wd Thiamine Folic d5 half at 75cc/h

## 2016-07-09 NOTE — ED Notes (Signed)
Lab called with critical Tegretol level 19.5, Dr Criss Alvine notified.

## 2016-07-09 NOTE — ED Notes (Signed)
Pt laying in bed sleeping, only responsive to pain, not following commands

## 2016-07-09 NOTE — Assessment & Plan Note (Signed)
Low mag  Plan replete

## 2016-07-09 NOTE — Assessment & Plan Note (Signed)
Currently level 16 at 5am,. QRS normal. Having confusing with restlessness (mild to mod) and sinus tachy 90s  Plan Recheck tegretol 3pm 07/09/2016 - (levels can rise for 96h) Keep in ICU Monitor HR and mental status with sitter at bedside Recheck QRS on 12 lead 3pm

## 2016-07-09 NOTE — ED Notes (Signed)
Pt transported to CT on baby monitor.

## 2016-07-09 NOTE — Consult Note (Signed)
[facility] Consult   Reason for Consult:  Overdose Referring Physician: Dr. Chase Caller Patient Identification: Tina [name] MRN:  [id number] Principal Diagnosis: Tegretol toxicity Diagnosis:   Patient Active Problem List   Diagnosis Date Noted  . Tegretol toxicity [T42.1X1A] 07/09/2016  . Electrolyte imbalance [E87.8] 07/09/2016  . Bipolar I disorder, most recent episode depressed ([place]) [F31.30] 06/19/2016  . MDD (major depressive disorder) [F32.9] 06/18/2016  . Bipolar 1 disorder, manic, full remission ([place]) [F31.74] 06/18/2016  . Migraine [G43.909] 02/20/2015  . Affective psychosis, bipolar ([place]) [F31.9]   . Bipolar disorder, current episode depressed, severe, without psychotic features ([place]) [F31.4]   . Bipolar affective disorder, current episode severe ([place]) [F31.9] 12/02/2014  . Bipolar I disorder, current or most recent episode depressed, in partial remission ([place]) [F31.75] 07/11/2014  . Bipolar 1 disorder, mixed ([place]) [F31.60] 06/27/2014  . Disease of thyroid gland [E07.9] 06/27/2014  . Anxiety [F41.9] 06/27/2014  . 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 Cannabis dependence without physiological dependence ([place]) [F12.20] 06/27/2014  . Bipolar 1 disorder, depressed, partial remission ([place]) [F31.75] 06/27/2014  . 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 Alcohol use disorder, severe, dependence ([place]) [F10.20] 06/24/2014  . Bipolar disorder, unspecified ([place]) [F31.9] 02/16/2013  . Adrenal insufficiency ([place]) [E27.40]   . Acne [L70.9]   . Hx of Lyme disease [Z86.19]   . 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 Bipolar I disorder, most recent episode (or current) manic ([place]) [F31.10] 08/29/2012  . Hypothyroidism [E03.9] 12/23/2010  . Myalgia [M79.1] 12/23/2010  . Fatigue [R53.83] 12/23/2010  . Unspecified vitamin deficiency [E56.9] 12/23/2010  . Depression [F32.9]     Total Time spent with patient: 30 minutes  Subjective:   Tina [name]  is a 46 y.o. female patient admitted with carbamazepine overdose .  HPI:  Patient is a 46 year old female. She is separated and currently living alone. She has a history of Bipolar Disorder. She was found at home, lethargic, incontinent, with slurred speech. She had been normal the day before. Patient was found to have an elevated carbamazepine level of 19.5 Today she is more alert , communicative, but remains slurred in speech and vaguely confused . Of note, she recognizes me from prior inpatient psychiatric admission. She has limited recollection of events that led to admission, but does endorse recent suicidal ideations, which she attributes to feeling anxious and overwhelmed about stressors, mainly financial. As reported by mother, who is at bedside, patient had recently spent a lot of her money during a period of hypomania/mania, resulting in her being in significant debt at this time. Of note, patient is known to Probation officer from prior psychiatric admissions , she had been admitted to [facility] from 4/8 through 4/15 for depression, suicidal ideations, and being off her psychiatric medications for several months. She responded well to Effexor XR, Lamictal, required  Midodrine for Hypotension ( chart notes indicate a history of adrenal insufficiency) . Of note, Tegretol was not one of her currently prescribed medications and report from family is that she may have had it from a prior medication trial.    Past Psychiatric History: Bipolar Disorder, history of suicidal ideations, history of prior psychiatric admissions, including earlier this month   Risk to Self: Is patient at risk for suicide?: No Risk to Others:   Prior Inpatient Therapy:   Prior Outpatient Therapy:    Past Medical History:  Past Medical History:  Diagnosis Date  . Acne   . Adrenal insufficiency ([place])   .  Bipolar 2 disorder ([place])   . Cellulitis   . Depression   . Genital warts   . Headache   . History of chicken pox   . Hx of  Lyme disease   . Hypothyroidism     Past Surgical History:  Procedure Laterality Date  . CESAREAN SECTION  2003, 2007, 2011  . INGUINAL HERNIA REPAIR Bilateral   . TONSILLECTOMY    . UMBILICAL HERNIA REPAIR     Family History:  Family History  Problem Relation Age of Onset  . Other Mother     PAD  . Heart disease Other     Grandparent  . Alcohol abuse Paternal Grandmother   . Mental retardation Paternal Grandmother   . Arthritis Maternal Grandfather   . Heart disease Maternal Grandfather    Family Psychiatric  History: non contributory  Social History:  History  Alcohol Use  . Yes    Comment: social drinker-few drinks a week      History  Drug Use No    Social History   Social History  . Marital status: Single    Spouse name: N/A  . Number of children: N/A  . Years of education: 97   Occupational History  . HOMEMAKER    Social History Main Topics  . Smoking status: Current Every Day Smoker    Packs/day: 1.50    Years: 29.00    Types: Cigarettes  . Smokeless tobacco: Never Used  . Alcohol use Yes     Comment: social drinker-few drinks a week   . Drug use: No  . Sexual activity: Not Currently   Other Topics Concern  . None   Social History Narrative   Regular exercise-no   Additional Social History:    Allergies:   Allergies  Allergen Reactions  . Stadol [Butorphanol] Other (See Comments)    Dependence  . Vancomycin Itching  . Penicillins Hives and Rash  . Sulfa Antibiotics Rash    Labs:  Results for orders placed or performed during the hospital encounter of 07/08/16 (from the past 48 hour(s))  Comprehensive metabolic panel     Status: Abnormal   Collection Time: 07/08/16 11:26 PM  Result Value Ref Range   Sodium 138 135 - 145 mmol/L   Potassium 3.9 3.5 - 5.1 mmol/L   Chloride 103 101 - 111 mmol/L   CO2 25 22 - 32 mmol/L   Glucose, Bld 140 (H) 65 - 99 mg/dL   BUN 10 6 - 20 mg/dL   Creatinine, Ser 0.75 0.44 - 1.00 mg/dL   Calcium 9.4  8.9 - 10.3 mg/dL   Total Protein 6.5 6.5 - 8.1 g/dL   Albumin 3.8 3.5 - 5.0 g/dL   AST 61 (H) 15 - 41 U/L   ALT 28 14 - 54 U/L   Alkaline Phosphatase 54 38 - 126 U/L   Total Bilirubin 0.6 0.3 - 1.2 mg/dL   GFR calc non Af Amer >60 >60 mL/min   GFR calc Af Amer >60 >60 mL/min    Comment: (NOTE) The eGFR has been calculated using the CKD EPI equation. This calculation has not been validated in all clinical situations. eGFR's persistently <60 mL/min signify possible Chronic Kidney Disease.    Anion gap 10 5 - 15  Carbamazepine level, total     Status: Abnormal   Collection Time: 07/08/16 11:26 PM  Result Value Ref Range   Carbamazepine Lvl 19.5 (HH) 4.0 - 12.0 ug/mL    Comment: RESULTS CONFIRMED BY  MANUAL DILUTION CRITICAL RESULT CALLED TO, READ BACK BY AND VERIFIED WITH: BROWN K,RN 07/09/16 0155 WAYK   T4, free     Status: None   Collection Time: 07/08/16 11:26 PM  Result Value Ref Range   Free T4 0.69 0.61 - 1.12 ng/dL    Comment: (NOTE) Biotin ingestion may interfere with free T4 tests. If the results are inconsistent with the TSH level, previous test results, or the clinical presentation, then consider biotin interference. If needed, order repeat testing after stopping biotin.   Ethanol     Status: None   Collection Time: 07/08/16 11:35 PM  Result Value Ref Range   Alcohol, Ethyl (B) <5 <5 mg/dL    Comment:        LOWEST DETECTABLE LIMIT FOR SERUM ALCOHOL IS 5 mg/dL FOR MEDICAL PURPOSES ONLY   Urine rapid drug screen (hosp performed)     Status: Abnormal   Collection Time: 07/08/16 11:35 PM  Result Value Ref Range   Opiates NONE DETECTED NONE DETECTED   Cocaine NONE DETECTED NONE DETECTED   Benzodiazepines POSITIVE (A) NONE DETECTED   Amphetamines NONE DETECTED NONE DETECTED   Tetrahydrocannabinol NONE DETECTED NONE DETECTED   Barbiturates NONE DETECTED NONE DETECTED    Comment:        DRUG SCREEN FOR MEDICAL PURPOSES ONLY.  IF CONFIRMATION IS NEEDED FOR ANY  PURPOSE, NOTIFY LAB WITHIN 5 DAYS.        LOWEST DETECTABLE LIMITS FOR URINE DRUG SCREEN Drug Class       Cutoff (ng/mL) Amphetamine      1000 Barbiturate      200 Benzodiazepine   161 Tricyclics       096 Opiates          300 Cocaine          300 THC              50   Urinalysis, Routine w reflex microscopic     Status: Abnormal   Collection Time: 07/08/16 11:35 PM  Result Value Ref Range   Color, Urine YELLOW YELLOW   APPearance CLEAR CLEAR   Specific Gravity, Urine 1.014 1.005 - 1.030   pH 5.0 5.0 - 8.0   Glucose, UA NEGATIVE NEGATIVE mg/dL   Hgb urine dipstick SMALL (A) NEGATIVE   Bilirubin Urine NEGATIVE NEGATIVE   Ketones, ur NEGATIVE NEGATIVE mg/dL   Protein, ur NEGATIVE NEGATIVE mg/dL   Nitrite NEGATIVE NEGATIVE   Leukocytes, UA NEGATIVE NEGATIVE   RBC / HPF 0-5 0 - 5 RBC/hpf   WBC, UA 0-5 0 - 5 WBC/hpf   Bacteria, UA RARE (A) NONE SEEN   Squamous Epithelial / LPF 0-5 (A) NONE SEEN   Mucous PRESENT   Acetaminophen level     Status: Abnormal   Collection Time: 07/08/16 11:35 PM  Result Value Ref Range   Acetaminophen (Tylenol), Serum <10 (L) 10 - 30 ug/mL    Comment:        THERAPEUTIC CONCENTRATIONS VARY SIGNIFICANTLY. A RANGE OF 10-30 ug/mL MAY BE AN EFFECTIVE CONCENTRATION FOR MANY PATIENTS. HOWEVER, SOME ARE BEST TREATED AT CONCENTRATIONS OUTSIDE THIS RANGE. ACETAMINOPHEN CONCENTRATIONS >150 ug/mL AT 4 HOURS AFTER INGESTION AND >50 ug/mL AT 12 HOURS AFTER INGESTION ARE OFTEN ASSOCIATED WITH TOXIC REACTIONS.   Salicylate level     Status: None   Collection Time: 07/08/16 11:35 PM  Result Value Ref Range   Salicylate Lvl <0.4 2.8 - 30.0 mg/dL  TSH     Status: Abnormal  Collection Time: 07/08/16 11:36 PM  Result Value Ref Range   TSH 0.010 (L) 0.350 - 4.500 uIU/mL    Comment: Performed by a 3rd Generation assay with a functional sensitivity of <=0.01 uIU/mL.  CBG monitoring, ED     Status: Abnormal   Collection Time: 07/08/16 11:51 PM  Result  Value Ref Range   Glucose-Capillary 106 (H) 65 - 99 mg/dL  I-Stat beta hCG blood, ED     Status: None   Collection Time: 07/08/16 11:56 PM  Result Value Ref Range   I-stat hCG, quantitative <5.0 <5 mIU/mL   Comment 3            Comment:   GEST. AGE      CONC.  (mIU/mL)   <=1 WEEK        5 - 50     2 WEEKS       50 - 500     3 WEEKS       100 - 10,000     4 WEEKS     1,000 - 30,000        FEMALE AND NON-PREGNANT FEMALE:     LESS THAN 5 mIU/mL   I-stat Chem 8, ED     Status: Abnormal   Collection Time: 07/08/16 11:58 PM  Result Value Ref Range   Sodium 141 135 - 145 mmol/L   Potassium 3.4 (L) 3.5 - 5.1 mmol/L   Chloride 107 101 - 111 mmol/L   BUN 9 6 - 20 mg/dL   Creatinine, Ser 0.40 (L) 0.44 - 1.00 mg/dL   Glucose, Bld 114 (H) 65 - 99 mg/dL   Calcium, Ion 1.01 (L) 1.15 - 1.40 mmol/L   TCO2 22 0 - 100 mmol/L   Hemoglobin 13.3 12.0 - 15.0 g/dL   HCT 39.0 36.0 - 46.0 %  Carbamazepine level, total     Status: Abnormal   Collection Time: 07/09/16  4:58 AM  Result Value Ref Range   Carbamazepine Lvl 16.6 (HH) 4.0 - 12.0 ug/mL    Comment: CRITICAL RESULT CALLED TO, READ BACK BY AND VERIFIED WITH: SHEPHERD B,RN 07/09/16 6962 [facility]   Basic metabolic panel     Status: Abnormal   Collection Time: 07/09/16  4:58 AM  Result Value Ref Range   Sodium 141 135 - 145 mmol/L   Potassium 4.1 3.5 - 5.1 mmol/L    Comment: DELTA CHECK NOTED   Chloride 109 101 - 111 mmol/L   CO2 22 22 - 32 mmol/L   Glucose, Bld 90 65 - 99 mg/dL   BUN 8 6 - 20 mg/dL   Creatinine, Ser 0.57 0.44 - 1.00 mg/dL   Calcium 8.5 (L) 8.9 - 10.3 mg/dL   GFR calc non Af Amer >60 >60 mL/min   GFR calc Af Amer >60 >60 mL/min    Comment: (NOTE) The eGFR has been calculated using the CKD EPI equation. This calculation has not been validated in all clinical situations. eGFR's persistently <60 mL/min signify possible Chronic Kidney Disease.    Anion gap 10 5 - 15  CBC     Status: Abnormal   Collection Time: 07/09/16  4:58  AM  Result Value Ref Range   WBC 11.0 (H) 4.0 - 10.5 K/uL   RBC 3.77 (L) 3.87 - 5.11 MIL/uL   Hemoglobin 12.5 12.0 - 15.0 g/dL   HCT 36.3 36.0 - 46.0 %   MCV 96.3 78.0 - 100.0 fL   MCH 33.2 26.0 - 34.0 pg  MCHC 34.4 30.0 - 36.0 g/dL   RDW 12.4 11.5 - 15.5 %   Platelets 172 150 - 400 K/uL  Magnesium     Status: Abnormal   Collection Time: 07/09/16  4:58 AM  Result Value Ref Range   Magnesium 1.6 (L) 1.7 - 2.4 mg/dL  MRSA PCR Screening     Status: None   Collection Time: 07/09/16  5:44 AM  Result Value Ref Range   MRSA by PCR NEGATIVE NEGATIVE    Comment:        The GeneXpert MRSA Assay (FDA approved for NASAL specimens only), is one component of a comprehensive MRSA colonization surveillance program. It is not intended to diagnose MRSA infection nor to guide or monitor treatment for MRSA infections.   Carbamazepine level, total     Status: Abnormal   Collection Time: 07/09/16  9:09 AM  Result Value Ref Range   Carbamazepine Lvl 16.3 (HH) 4.0 - 12.0 ug/mL    Comment: CRITICAL RESULT CALLED TO, READ BACK BY AND VERIFIED WITH: P.KELLER,RN 07/09/16 @1149  BY V.WILKINS     Current Facility-Administered Medications  Medication Dose Route Frequency Provider Last Rate Last Dose  . acetaminophen (TYLENOL) tablet 650 mg  650 mg Oral Q6H PRN Norman Herrlich, MD       Or  . acetaminophen (TYLENOL) suppository 650 mg  650 mg Rectal Q6H PRN Norman Herrlich, MD      . dextrose 5 %-0.45 % sodium chloride infusion   Intravenous Continuous Brand Males, MD 75 mL/hr at 07/09/16 0807    . enoxaparin (LOVENOX) injection 40 mg  40 mg Subcutaneous Daily Norman Herrlich, MD      . folic acid injection 1 mg  1 mg Intravenous Daily Brand Males, MD   1 mg at 07/09/16 1006  . LORazepam (ATIVAN) injection 2 mg  2 mg Intravenous Q5 min PRN Norman Herrlich, MD      . pantoprazole (PROTONIX) injection 40 mg  40 mg Intravenous QHS Bethany Molt, DO      . thiamine (B-1) injection 100 mg  100 mg  Intravenous Daily Brand Males, MD   100 mg at 07/09/16 1006    Musculoskeletal: Strength & Muscle Tone: decreased Gait & Station: unable to stand Patient leans: N/A  Psychiatric Specialty Exam: Physical Exam  ROS feels vaguely nauseous, weak   Blood pressure 123/74, pulse 98, temperature 98.1 F (36.7 C), temperature source Oral, resp. rate 17, height 5' 2"  (1.575 m), weight 46.3 kg (102 lb), SpO2 100 %.Body mass index is 18.66 kg/m.  General Appearance: Fairly Groomed  Eye Contact:  Good  Speech:  slow , slurred, but able to make self understood  Volume:  Decreased  Mood:  Anxious and Depressed  Affect:  labile, briefly tearful at times   Thought Process:  Linear and Descriptions of Associations: Intact- slowed  Orientation:  Other:  oriented to [place], hospital, but not name of it, to April, but stated 2008 rfather than 2018.   Thought Content:  denies hallucinations, does not appear internally preoccupied   Suicidal Thoughts:  reports intermittent suicidal ideations, not at this present time  Homicidal Thoughts:  No  Memory:  recent and remote fair, limited recollection of overdose   Judgement:  Impaired  Insight:  Fair  Psychomotor Activity:  Decreased  Concentration:  Concentration: Fair and Attention Span: Fair  Recall:  [employer] of Knowledge:  Good  Language:  Fair  Akathisia:  Negative  Handed:  Right  AIMS (if indicated):     Assets:  Desire for Improvement Resilience Social Support  ADL's:  fair  Cognition:  Impaired,  Mild  Sleep:        Treatment Plan Summary: see below  Disposition: Recommend psychiatric Inpatient admission when medically cleared.   Continue one to one sitter for safety  As remains neurologically compromised following overdose, with confusion and slurred speech, would postpone starting psychiatric medications until further stabilized .   Once stabilized would consider initially  restarting Lamictal at 25 mgrs QDAY, which  had been helping and was well tolerated .  Based on current evaluation , patient will require inpatient psychiatric admission once medically cleared .  Psychiatry will follow with you   Jenne Campus, MD 07/09/2016 3:23 PM

## 2016-07-09 NOTE — Progress Notes (Addendum)
Name: Tina [name] MRN: [id number] DOB: [birth year]    LOS: 0  PCCM ADMISSION NOTE  brief 46 y/o F with significant psychiatric history including bipolar 1 with recent manic episode, now in depressive state, history of suicide attempt by cutting, polysubstance abuse including alcohol dependence/illicit drug use who presents from home with parents. Family went to check on patient this evening as they had not heard from her all day. She was found in bed incontinent per family with muffled/slurred speech and lethargy. They last saw her normal morning of 4/26 when patient was complaining of significant depression secondary to spending all of her savings in her recent manic episode. She did not express any suicidal ideations per family.   In the emergency department vital signs were stable however she was only responsive to painful stimuli and did not follow commands per EDP. She occasionally had garbled speech. EKG with sinus tachycardia at 117, QT 412 and without any ST segment changes to suggest acute ischemia. CMET wnl except for AST of 61. Carbamazepine level 19.5. CT head without acute process. CXR without active cardiopulmonary disease. PCCM was asked to admit for carbamazepine overdose.   Lines / Drains: PIV x2  Tests / Events: 4/7-4/15: Admitted for suicidal ideation 4/26: Last seen normal at 10:30 am. Depressed, but did not express suicidal ideations 4/27: found with AMS by family. Carbamazepine level 19.5. CT head without acute process.    SUBJECTIVE/OVERNIGHT/INTERVAL HX 4/28 - 5am tegretol level at 16 (goal < 11). Stil confused and restless but improving. Mildly tachycadic. QRS in am  Vital Signs: Temp:  [97.5 F (36.4 C)-98.1 F (36.7 C)] 97.5 F (36.4 C) (04/28 0724) Pulse Rate:  [84-119] 97 (04/28 0700) Resp:  [11-24] 15 (04/28 0700) BP: (81-134)/(56-102) 116/81 (04/28 0700) SpO2:  [89 %-100 %] 100 % (04/28 0700) Weight:  [46.3 kg (102 lb)] 46.3 kg (102 lb)  (04/27 2325) I/O last 3 completed shifts: In: 3303.1 [I.V.:1127.1; IV Piggyback:2176] Out: 1150 [Urine:1150]   EXAM  General Appearance:    Looks ill   Head:    Normocephalic, without obvious abnormality, atraumatic  Eyes:    PERRL -yes, conjunctiva/corneas - clear      Ears:    Normal external ear canals, both ears  Nose:   NG tube - no  Throat:  ETT TUBE - no , OG tube - no  Neck:   Supple,  No enlargement/tenderness/nodules     Lungs:     Clear to auscultation bilaterally, V  Chest wall:    No deformity  Heart:    S1 and S2 normal, no murmur, CVP - no.  Pressors - no BUT HR 93  Abdomen:     Soft, no masses, no organomegaly  Genitalia:    Not done  Rectal:   not done  Extremities:   Extremities- intact     Skin:   Intact in exposed areas . Sacral area - no decub reproted     Neurologic:   Sedation - none -> RASS - +2 . Moves all 4s - yes. CAM-ICU - + for delirium . Orientation - confused       LABS  PULMONARY  Recent Labs Lab 07/08/16 2358  TCO2 22    CBC  Recent Labs Lab 07/08/16 2358 07/09/16 0458  HGB 13.3 12.5  HCT 39.0 36.3  WBC  --  11.0*  PLT  --  172    COAGULATION No results for input(s): INR in the last 168 hours.  CARDIAC  No results for input(s): TROPONINI in the last 168 hours. No results for input(s): PROBNP in the last 168 hours.   CHEMISTRY  Recent Labs Lab 07/08/16 2326 07/08/16 2358 07/09/16 0458  NA 138 141 141  K 3.9 3.4* 4.1  CL 103 107 109  CO2 25  --  22  GLUCOSE 140* 114* 90  BUN CREATININE 0.75 0.40* 0.57  CALCIUM 9.4  --  8.5*  MG  --   --  1.6*   Estimated Creatinine Clearance: 66.3 mL/min (by C-G formula based on SCr of 0.57 mg/dL).   LIVER  Recent Labs Lab 07/08/16 2326  AST 61*  ALT 28  ALKPHOS 54  BILITOT 0.6  PROT 6.5  ALBUMIN 3.8     INFECTIOUS No results for input(s): LATICACIDVEN, PROCALCITON in the last 168 hours.   ENDOCRINE CBG (last 3)   Recent Labs  07/08/16 2351   GLUCAP 106*         IMAGING x48h  - image(s) personally visualized  -   highlighted in bold Ct Head Wo Contrast  Result Date: 07/09/2016 CLINICAL DATA:  Altered mental status, incontinent. History of bipolar disorder. EXAM: CT HEAD WITHOUT CONTRAST TECHNIQUE: Contiguous axial images were obtained from the base of the skull through the vertex without intravenous contrast. COMPARISON:  CT HEAD Jul 24, 2011 FINDINGS: BRAIN: No intraparenchymal hemorrhage, mass effect nor midline shift. The ventricles and sulci are normal. No acute large vascular territory infarcts. Bilateral inferior basal ganglia perivascular spaces. No abnormal extra-axial fluid collections. Basal cisterns are patent. VASCULAR: Unremarkable. SKULL/SOFT TISSUES: No skull fracture. No significant soft tissue swelling. ORBITS/SINUSES: The included ocular globes and orbital contents are normal.Frothy secretions RIGHT sphenoid sinus Set, mild paranasal sinus mucosal thickening. Mastoid air cells are well aerated. OTHER: None. IMPRESSION: Normal CT HEAD. Electronically Signed   By: Awilda Metro M.D.   On: 07/09/2016 01:31   Dg Pelvis Portable  Result Date: 07/09/2016 CLINICAL DATA:  46 year old female with left hip pain. EXAM: PORTABLE PELVIS 1-2 VIEWS COMPARISON:  Left hip radiograph dated 07/08/2016 FINDINGS: There is no evidence of pelvic fracture or diastasis. No pelvic bone lesions are seen. A tubal ligation clip noted over the right hemipelvis. The soft tissues appear unremarkable. IMPRESSION: No acute/traumatic osseous pathology. Electronically Signed   By: Elgie Collard M.D.   On: 07/09/2016 00:28   Dg Chest Portable 1 View  Result Date: 07/09/2016 CLINICAL DATA:  46 year old female with altered mental status EXAM: PORTABLE CHEST 1 VIEW COMPARISON:  Chest radiograph dated 03/25/2016 FINDINGS: The heart size and mediastinal contours are within normal limits. Both lungs are clear. The visualized skeletal structures are  unremarkable. IMPRESSION: No active disease. Electronically Signed   By: Elgie Collard M.D.   On: 07/09/2016 00:25   Dg Femur Portable Min 2 Views Left  Result Date: 07/09/2016 CLINICAL DATA:  46 year old female with left leg pain. EXAM: LEFT FEMUR PORTABLE 2 VIEWS COMPARISON:  Pelvic radiograph dated 07/08/2016 FINDINGS: There is no evidence of fracture or other focal bone lesions. Soft tissues are unremarkable. IMPRESSION: Negative. Electronically Signed   By: Elgie Collard M.D.   On: 07/09/2016 00:27       ASSESSMENT and PLAN  Tegretol toxicity Currently level 16 at 5am,. QRS normal. Having confusing with restlessness (mild to mod) and sinus tachy 90s  Plan Recheck tegretol 3pm 07/09/2016 - (levels can rise for 96h) Keep in ICU Monitor HR and mental status with sitter at bedside  Recheck QRS on 12 lead 3pm   Hypothyroidism Start home syntrhoid 07/10/16 Check TSH  Bipolar 1 disorder, mixed (HCC) Need to call psych - paged by secretary Sitter at bedside  Electrolyte imbalance Low mag  Plan replete  Alcohol use disorder, severe, dependence (HCC) Monitor for wd Thiamine Folic d5 half at 75cc/h      FAMILY  - Updates: 07/09/2016 --> none at beside family  - Inter-disciplinary family meet or Palliative Care meeting due by:  DAy 7. Current LOS is LOS 0 days  CODE STATUS    Code Status Orders        Start     Ordered   07/09/16 0444  Full code  Continuous     07/09/16 0443    Code Status History    Date Active Date Inactive Code Status Order ID Comments User Context   06/19/2016 12:27 PM 06/26/2016  7:39 PM Full Code [id number]  Lenord Fellers, RN Inpatient   06/18/2016  1:10 PM 06/18/2016  5:36 PM Full Code [id number]  Adonis Brook, NP ED   12/02/2014  2:11 AM 12/22/2014  6:53 PM Full Code [id number]  Kerry Hough, PA-C Inpatient   12/01/2014  9:45 PM 12/02/2014  2:11 AM Full Code [id number]  Elpidio Anis, PA-C ED   08/23/2012 11:34 AM 08/25/2012   6:24 PM Full Code [id number]  Ethelda Chick, MD ED        DISPO Keep in ICU       The patient is critically ill with multiple organ systems failure and requires high complexity decision making for assessment and support, frequent evaluation and titration of therapies, application of advanced monitoring technologies and extensive interpretation of multiple databases.   Critical Care Time devoted to patient care services described in this note is  30  Minutes. This time reflects time of care of this signee Dr Kalman Shan. This critical care time does not reflect procedure time, or teaching time or supervisory time of PA/NP/Med student/Med Resident etc but could involve care discussion time    Dr. Kalman Shan, M.D., [facility].C.P Pulmonary and Critical Care Medicine Staff Physician [facility] Pager: [phone], If no answer or between  15:00h - 7:00h: call [phone]  07/09/2016 7:48 AM          Physical Examination: General: Confused caucasian female resting upright in bed. NAD Neuro: Fatigued and appears dazed. Responds to questions with garbled speech. Moves all 4 extremities spontaneously. Able to reposition self in bed on own.  HEENT: Adequate cough reflex, able to clear secretions. Mucous membranes dry. Pupils 5 mm but equal.  Neck: No JVD appreciated.   Cardiovascular: RRR, without MGR. 2+ distal pulses.  Lungs: Occasional rhonchi however cleared with good cough. Normal WOB.   Abdomen: Lower pelvic fullness however abdomen NT.  Musculoskeletal: No BL LE edema appreciated. TTP left trochanteric region Skin: Warm and well perfused. No cyanosis.   Ventilator settings: NA   Labs and Imaging:  Reviewed.  Please refer to the Assessment and Plan section for relevant results.  Assessment and Plan: This is a 46 y/o F admitted with AMS thought to be secondary to carbamazepine toxicity, level 19.5 on admission. Has  history of bipolar 1 disorder with recent manic episode, currently in depressive state. Recently admitted for suicidal ideations.   NEUROLOGIC/PSYCHIATRIC ASSESSMENT:   Hx of Bipolar 1 with recent manic episode, currently in depressive state per family Carbamazepine toxicity -  unclear how much, when or if intentional. Last seen normal 4/26 Hx of substance abuse PLAN:   Daily carbamazepine level, trend Monitor for worsening mental status, may still require intubation although appears to be improving Seizure precautions Call psych in AM UDS pending  PULMONARY ASSESSMENT: Currently protecting airway in setting of carbamazepine OD however has potential to develop respiratory failure 2/2 nature of tegretol pharmacokinetics Current tobacco abuse Has never been intubated PLAN:   Monitor in ICU closely. Currently with good cough and clearing secretions.   CARDIOVASCULAR ASSESSMENT: EKG with sinus tach of 117, QT of 412 PLAN:  No prolonged QT now, however carbamazepine OD associated with this Avoid potential QT prolonging meds Consider bicarb if prolonged QT were to develop Tele Follow-up CK EKG daily  RENAL ASSESSMENT:   Cr 0.75 on presentation. Lytes WNL except K 3.3 PLAN:   Replace K Mag level Monitor UOP, carbamazepine has anticholinergic effects which could cause urinary retention  GASTROINTESTINAL ASSESSMENT:   Nutrition PPi ppx PLAN:   SLP eval once AMS clears a bit more Protonix  HEMATOLOGIC ASSESSMENT:   DVT ppx H&H without anemia, CBC pend PLAN:  Lovenox FU CBC  INFECTIOUS ASSESSMENT:  Family denies any recent illness. Pt unable to deny infectious symptoms.  UA without infection, CXR without consolidation History of disseminated lyme disease PLAN:   Trend fever and WBC curves  ENDOCRINE ASSESSMENT:  Hypothyroidism on Armour thyroid 90 mcg TSH 0.01, Free T4 0.6 Hx of adrenal insufficiency, BP stable PLAN:   Continue Armour thyroid  Best  practices / Disposition: -->ICU status under PCCM -->full code  -->Lovenox for DVT Px -->Protonix for GI Px -->diet NPO for now, SLP eval prior to PO -->family updated at bedside  The patient is critically ill with multiple organ systems failure and requires high complexity decision making for assessment and support, frequent evaluation and titration of therapies, application of advanced monitoring technologies and extensive interpretation of multiple databases. Critical Care Time devoted to patient care services described in this note is 41 minutes.  Rashika Bettes, D.O.  07/09/2016, 7:47 AM

## 2016-07-10 DIAGNOSIS — T421X2D Poisoning by iminostilbenes, intentional self-harm, subsequent encounter: Secondary | ICD-10-CM

## 2016-07-10 LAB — BASIC METABOLIC PANEL
ANION GAP: 8 (ref 5–15)
BUN: 6 mg/dL (ref 6–20)
CHLORIDE: 108 mmol/L (ref 101–111)
CO2: 24 mmol/L (ref 22–32)
Calcium: 8.4 mg/dL — ABNORMAL LOW (ref 8.9–10.3)
Creatinine, Ser: 0.6 mg/dL (ref 0.44–1.00)
GFR calc Af Amer: >60 mL/min (ref >60)
GFR calc non Af Amer: >60 mL/min (ref >60)
GLUCOSE: 108 mg/dL — AB (ref 65–99)
POTASSIUM: 3.6 mmol/L (ref 3.5–5.1)
Sodium: 140 mmol/L (ref 135–145)

## 2016-07-10 LAB — TSH: TSH: 0.03 u[IU]/mL — AB (ref 0.350–4.500)

## 2016-07-10 LAB — CARBAMAZEPINE LEVEL, TOTAL: Carbamazepine Lvl: 9.8 ug/mL (ref 4.0–12.0)

## 2016-07-10 LAB — PHOSPHORUS: PHOSPHORUS: 2.8 mg/dL (ref 2.5–4.6)

## 2016-07-10 LAB — LACTIC ACID, PLASMA: Lactic Acid, Venous: 0.9 mmol/L (ref 0.5–1.9)

## 2016-07-10 LAB — CK: CK TOTAL: 1446 U/L — AB (ref 38–234)

## 2016-07-10 LAB — MAGNESIUM: Magnesium: 1.9 mg/dL (ref 1.7–2.4)

## 2016-07-10 MED ORDER — IBUPROFEN 100 MG/5ML PO SUSP
400.0000 mg | Freq: Three times a day (TID) | ORAL | Status: DC | PRN
  Administered 2016-07-10 – 2016-07-11 (×2): 400 mg via ORAL
  Filled 2016-07-10 (×3): qty 20

## 2016-07-10 MED ORDER — PANTOPRAZOLE SODIUM 40 MG PO TBEC
40.0000 mg | DELAYED_RELEASE_TABLET | Freq: Every evening | ORAL | Status: DC
  Administered 2016-07-10 – 2016-07-11 (×2): 40 mg via ORAL
  Filled 2016-07-10 (×2): qty 1

## 2016-07-10 MED ORDER — SODIUM CHLORIDE 0.9 % IV BOLUS (SEPSIS)
1000.0000 mL | Freq: Once | INTRAVENOUS | Status: AC
  Administered 2016-07-10: 1000 mL via INTRAVENOUS

## 2016-07-10 MED ORDER — FOLIC ACID 1 MG PO TABS
1.0000 mg | ORAL_TABLET | Freq: Every day | ORAL | Status: DC
  Administered 2016-07-10 – 2016-07-12 (×3): 1 mg via ORAL
  Filled 2016-07-10 (×3): qty 1

## 2016-07-10 MED ORDER — VITAMIN B-1 100 MG PO TABS
100.0000 mg | ORAL_TABLET | Freq: Every day | ORAL | Status: DC
  Administered 2016-07-10 – 2016-07-12 (×3): 100 mg via ORAL
  Filled 2016-07-10 (×3): qty 1

## 2016-07-10 NOTE — Progress Notes (Addendum)
PULMONARY  / CRITICAL CARE MEDICINE  Name: Tina [name] MRN: [id number] DOB: [birth year]    LOS: 1  CHIEF COMPLAINT: Carbamazepine overdose   BRIEF PATIENT DESCRIPTION: 46 y/o F with significant psychiatric history including bipolar 1 with recent manic episode, now in depressive state, history of suicide attempt by cutting, polysubstance abuse including alcohol dependence/illicit drug use who presents from home with parents. Family went to check on patient this evening as they had not heard from her all day. She was found in bed incontinent per family with muffled/slurred speech and lethargy. They last saw her normal morning of 4/26 when patient was complaining of significant depression secondary to spending all of her savings in her recent manic episode. She did not express any suicidal ideations per family.   In the emergency department vital signs were stable however she was only responsive to painful stimuli and did not follow commands per EDP. She occasionally had garbled speech. EKG with sinus tachycardia at 117, QT 412 and without any ST segment changes to suggest acute ischemia. CMET wnl except for AST of 61. Carbamazepine level 19.5. CT head without acute process. CXR without active cardiopulmonary disease. PCCM was asked to admit for carbamazepine overdose.   LINES / TUBES: Urethral catheter 4/28 >>  Peripheral IV 4/28 >>   CULTURES: MRSA negative   ANTIBIOTICS: None   SIGNIFICANT EVENTS:  4/28 chest, pelvis, and femur xray- no acute fracture  CT head- no acute stroke  UDS- positive for benzos   LEVEL OF CARE:  Critical care  PRIMARY SERVICE:  PCCM CONSULTANTS:  none CODE STATUS FULL  DIET:  Dysphagia 2  DVT Px: Lovenox  GI Px:  protonix   Prior to Admission medications   Medication Sig Start Date End Date Taking? Authorizing Provider  hydrOXYzine (ATARAX/VISTARIL) 50 MG tablet Take 1 tablet (50 mg total) by mouth at bedtime as needed for anxiety (sleep).  06/26/16  Yes Oneta Rack, NP  lamoTRIgine (LAMICTAL) 25 MG tablet Take 1 tablet (25 mg total) by mouth daily. 06/26/16  Yes Oneta Rack, NP  temazepam (RESTORIL) 30 MG capsule Take 15-30 mg by mouth at bedtime as needed for sleep.   Yes Historical Provider, MD  thyroid (ARMOUR) 90 MG tablet Take 1 tablet (90 mg total) by mouth daily before breakfast. 06/26/16  Yes Oneta Rack, NP  traZODone (DESYREL) 150 MG tablet Take 0.5 tablets (75 mg total) by mouth at bedtime. 06/26/16  Yes Oneta Rack, NP  venlafaxine XR (EFFEXOR-XR) 75 MG 24 hr capsule Take 1 capsule (75 mg total) by mouth daily with breakfast. 06/26/16  Yes Oneta Rack, NP  metroNIDAZOLE (FLAGYL) 500 MG tablet Take 1 tablet (500 mg total) by mouth every 12 (twelve) hours. Patient not taking: Reported on 07/09/2016 06/26/16   Oneta Rack, NP  midodrine (PROAMATINE) 5 MG tablet Take 1 tablet (5 mg total) by mouth 2 (two) times daily with a meal. Patient not taking: Reported on 07/09/2016 06/26/16   Oneta Rack, NP   FAMILY HISTORY:  Family History  Problem Relation Age of Onset  . Other Mother     PAD  . Heart disease Other     Grandparent  . Alcohol abuse Paternal Grandmother   . Mental retardation Paternal Grandmother   . Arthritis Maternal Grandfather   . Heart disease Maternal Grandfather     VITAL SIGNS: Temp:  [97.9 F (36.6 C)-98.4 F (36.9 C)] 98.4 F (36.9 C) (04/29 0729) Pulse  Rate:  [79-103] 81 (04/29 0600) Resp:  [13-22] 15 (04/29 0600) BP: (100-148)/(62-94) 117/62 (04/29 0600) SpO2:  [85 %-100 %] 96 % (04/29 0600) HEMODYNAMICS:   VENTILATOR SETTINGS:   INTAKE / OUTPUT: Intake/Output      04/28 0701 - 04/29 0700 04/29 0701 - 04/30 0700   P.O. 360    I.V. (mL/kg) 1775 (38.3)    IV Piggyback 188    Total Intake(mL/kg) 2323 (50.2)    Urine (mL/kg/hr) 4050 (3.6)    Total Output 4050     Net -1727            PHYSICAL EXAMINATION: General:  NAD, well developed, well nourished  Neuro:   AAOx3, moving all extremities, left glueteal tenderness HEENT:  MMM Cardiovascular: RRR, no murmur  Lungs: CTAB over anterior lung fields  Abdomen:  Soft, non tender, non distended  Musculoskeletal: normal tone , point tenderness over left gluteus medius  Skin: intact, warm, dry    LABS: Cbc  Recent Labs Lab 07/08/16 2358 07/09/16 0458  WBC  --  11.0*  HGB 13.3 12.5  HCT 39.0 36.3  PLT  --  172    Chemistry   Recent Labs Lab 07/08/16 2326 07/08/16 2358 07/09/16 0458 07/10/16 0225  NA 138 141 141 140  K 3.9 3.4* 4.1 3.6  CL 103 107 109 108  CO2 25  --  22 24  BUN CREATININE 0.75 0.40* 0.57 0.60  CALCIUM 9.4  --  8.5* 8.4*  MG  --   --  1.6* 1.9  PHOS  --   --   --  2.8  GLUCOSE 140* 114* 90 108*    Liver fxn  Recent Labs Lab 07/08/16 2326  AST 61*  ALT 28  ALKPHOS 54  BILITOT 0.6  PROT 6.5  ALBUMIN 3.8   coags No results for input(s): APTT, INR in the last 168 hours. Sepsis markers No results for input(s): LATICACIDVEN, PROCALCITON in the last 168 hours. Cardiac markers No results for input(s): CKTOTAL, CKMB, TROPONINI in the last 168 hours. BNP No results for input(s): PROBNP in the last 168 hours. ABG  Recent Labs Lab 07/08/16 2358  TCO2 22    CBG trend  Recent Labs Lab 07/08/16 2351  GLUCAP 106*    IMAGING:  ECG:  DIAGNOSES: Principal Problem:   Tegretol toxicity Active Problems:   Hypothyroidism   Alcohol use disorder, severe, dependence (HCC)   Bipolar 1 disorder, mixed (HCC)   Bipolar affective disorder, current episode severe (HCC)   Electrolyte imbalance   ASSESSMENT / PLAN:  PULMONARY  ASSESSMENT: No acute issues  PLAN:   Continue to monitor   CARDIOVASCULAR  ASSESSMENT:  Presented with carbamazepine overdose  AM EKG reveals NSR  PLAN:  Continue to monitor    RENAL  ASSESSMENT:  Hypomagnesemia, repleated  Hypokalemia, repleated  s/p 2 L NS bolus  ? Rhabdomyolisis  PLAN:   Follow up  CK  D5 1/2 NS 75 ml/ hr gtt increased to 125 mg/hr   GASTROINTESTINAL  ASSESSMENT:   Presented with drug overdose  Carbamazepine level improved  Hx of alcohol use  PLAN:   IV protonix  Folic acid, thiamine   HEMATOLOGIC  ASSESSMENT:   No acute issues  PLAN:  Lovenox for DVT ppx   INFECTIOUS  ASSESSMENT:  No acute issues  PLAN:   Continue to monitor   ENDOCRINE  ASSESSMENT:   Low TSH, holding armour  PLAN:   May  require ISS with increased D5 rate   NEUROLOGIC  ASSESSMENT:   Presented with intentional carbamazepine overdose  PLAN:   PRN ativan   Pulmonary and Critical Care Medicine [facility] Pager: [phone]  07/10/2016, 7:48 AM

## 2016-07-10 NOTE — Progress Notes (Signed)
  Speech Language Pathology Treatment: Dysphagia  Patient Details Name: Tina [name] MRN: [id number] DOB: [birth year] Today's Date: 07/10/2016 Time: 4132-4401 SLP Time Calculation (min) (ACUTE ONLY): 8 min  Assessment / Plan / Recommendation Clinical Impression  Pt's speech and mentation appear to be improved from initial SLP evaluation. Her speech is clear and intelligible, and she consumes solids and liquids swiftly without oral residue. No overt s/s of aspiration are observed. Recommend advancement to regular textures and thin liquids. SLP will f/u briefly for tolerance.   HPI HPI: 46 y/o F with significant psychiatric history including bipolar 1 with recent manic episode, now in depressive state, history of suicide attempt by cutting, polysubstance abuse including alcohol dependence/illicit drug use who presented from home with parents. She was found in bed incontinent per family with muffled/slurred speech and lethargy. EKG with sinus tachycardia at 117, QT 412 and without any ST segment changes to suggest acute ischemia. CMET wnl except for AST of 61. Carbamazepine level 19.5. CT head without acute process. CXR without active cardiopulmonary disease. Admitted for carbamazepine overdose. Referred for swallowing evaluation.      SLP Plan  Continue with current plan of care       Recommendations  Diet recommendations: Regular;Thin liquid Liquids provided via: Cup;Straw Medication Administration: Whole meds with liquid Supervision: Patient able to self feed;Intermittent supervision to cue for compensatory strategies Compensations: Slow rate;Small sips/bites Postural Changes and/or Swallow Maneuvers: Seated upright 90 degrees                Oral Care Recommendations: Oral care BID Follow up Recommendations: None SLP Visit Diagnosis: Dysphagia, unspecified (R13.10) Plan: Continue with current plan of care       GO                Maxcine Ham 07/10/2016, 11:19  AM  Maxcine Ham, M.A. CCC-SLP [phone]

## 2016-07-10 NOTE — Progress Notes (Signed)
Report given to RN Tresa Endo on 5 west room 6.

## 2016-07-11 DIAGNOSIS — T421X2A Poisoning by iminostilbenes, intentional self-harm, initial encounter: Principal | ICD-10-CM

## 2016-07-11 DIAGNOSIS — T1491XA Suicide attempt, initial encounter: Secondary | ICD-10-CM

## 2016-07-11 DIAGNOSIS — Z811 Family history of alcohol abuse and dependence: Secondary | ICD-10-CM

## 2016-07-11 DIAGNOSIS — F314 Bipolar disorder, current episode depressed, severe, without psychotic features: Secondary | ICD-10-CM

## 2016-07-11 DIAGNOSIS — Z810 Family history of intellectual disabilities: Secondary | ICD-10-CM

## 2016-07-11 DIAGNOSIS — F17210 Nicotine dependence, cigarettes, uncomplicated: Secondary | ICD-10-CM

## 2016-07-11 LAB — MAGNESIUM: Magnesium: 1.6 mg/dL — ABNORMAL LOW (ref 1.7–2.4)

## 2016-07-11 LAB — PHOSPHORUS: PHOSPHORUS: 3.5 mg/dL (ref 2.5–4.6)

## 2016-07-11 LAB — CK: CK TOTAL: 572 U/L — AB (ref 38–234)

## 2016-07-11 MED ORDER — MAGNESIUM OXIDE 400 (241.3 MG) MG PO TABS
800.0000 mg | ORAL_TABLET | Freq: Once | ORAL | Status: AC
  Administered 2016-07-11: 800 mg via ORAL
  Filled 2016-07-11: qty 2

## 2016-07-11 MED ORDER — LAMOTRIGINE 25 MG PO TABS
25.0000 mg | ORAL_TABLET | Freq: Two times a day (BID) | ORAL | Status: DC
  Administered 2016-07-11 – 2016-07-12 (×3): 25 mg via ORAL
  Filled 2016-07-11 (×3): qty 1

## 2016-07-11 MED ORDER — VENLAFAXINE HCL ER 75 MG PO CP24
75.0000 mg | ORAL_CAPSULE | Freq: Every day | ORAL | Status: DC
  Administered 2016-07-12: 75 mg via ORAL
  Filled 2016-07-11: qty 1

## 2016-07-11 MED ORDER — DOXYCYCLINE HYCLATE 100 MG PO TABS
100.0000 mg | ORAL_TABLET | Freq: Two times a day (BID) | ORAL | Status: DC
  Administered 2016-07-11 – 2016-07-12 (×2): 100 mg via ORAL
  Filled 2016-07-11 (×3): qty 1

## 2016-07-11 MED ORDER — MUPIROCIN CALCIUM 2 % EX CREA
TOPICAL_CREAM | Freq: Two times a day (BID) | CUTANEOUS | Status: DC
  Administered 2016-07-11: 1 via TOPICAL
  Filled 2016-07-11: qty 15

## 2016-07-11 NOTE — Consult Note (Signed)
[facility] Consult   Reason for Consult:  Overdose Referring Physician: Dr. Chase Caller Patient Identification: [place] Kirchner MRN:  [id number] Principal Diagnosis: Tegretol toxicity Diagnosis:   Patient Active Problem List   Diagnosis Date Noted  . Tegretol toxicity [T42.1X1A] 07/09/2016  . Electrolyte imbalance [E87.8] 07/09/2016  . Bipolar I disorder, most recent episode depressed ([place]) [F31.30] 06/19/2016  . MDD (major depressive disorder) [F32.9] 06/18/2016  . Bipolar 1 disorder, manic, full remission ([place]) [F31.74] 06/18/2016  . Migraine [G43.909] 02/20/2015  . Affective psychosis, bipolar ([place]) [F31.9]   . Bipolar disorder, current episode depressed, severe, without psychotic features ([place]) [F31.4]   . Bipolar affective disorder, current episode severe ([place]) [F31.9] 12/02/2014  . Bipolar I disorder, current or most recent episode depressed, in partial remission ([place]) [F31.75] 07/11/2014  . Bipolar 1 disorder, mixed ([place]) [F31.60] 06/27/2014  . Disease of thyroid gland [E07.9] 06/27/2014  . Anxiety [F41.9] 06/27/2014  . 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 Cannabis dependence without physiological dependence ([place]) [F12.20] 06/27/2014  . Bipolar 1 disorder, depressed, partial remission ([place]) [F31.75] 06/27/2014  . 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 Alcohol use disorder, severe, dependence ([place]) [F10.20] 06/24/2014  . Bipolar disorder, unspecified ([place]) [F31.9] 02/16/2013  . Adrenal insufficiency ([place]) [E27.40]   . Acne [L70.9]   . Hx of Lyme disease [Z86.19]   . 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 Bipolar I disorder, most recent episode (or current) manic ([place]) [F31.10] 08/29/2012  . Hypothyroidism [E03.9] 12/23/2010  . Myalgia [M79.1] 12/23/2010  . Fatigue [R53.83] 12/23/2010  . Unspecified vitamin deficiency [E56.9] 12/23/2010  . Depression [F32.9]     Total Time spent with patient: 30 minutes  Subjective:   Corvette Orser  is a 46 y.o. female patient admitted with carbamazepine overdose .  HPI:  Patient is a 46 year old female. She is separated and currently living alone. She has a history of Bipolar Disorder. She was found at home, lethargic, incontinent, with slurred speech. She had been normal the day before. Patient was found to have an elevated carbamazepine level of 19.5 Today she is more alert , communicative, but remains slurred in speech and vaguely confused . Of note, she recognizes me from prior inpatient psychiatric admission. She has limited recollection of events that led to admission, but does endorse recent suicidal ideations, which she attributes to feeling anxious and overwhelmed about stressors, mainly financial. As reported by mother, who is at bedside, patient had recently spent a lot of her money during a period of hypomania/mania, resulting in her being in significant debt at this time. Of note, patient is known to Probation officer from prior psychiatric admissions , she had been admitted to [facility] from 4/8 through 4/15 for depression, suicidal ideations, and being off her psychiatric medications for several months. She responded well to Effexor XR, Lamictal, required  Midodrine for Hypotension ( chart notes indicate a history of adrenal insufficiency) . Of note, Tegretol was not one of her currently prescribed medications and report from family is that she may have had it from a prior medication trial.    Past Psychiatric History: Bipolar Disorder, history of suicidal ideations, history of prior psychiatric admissions, including earlier this month   07/11/2016 Interval history: Patient seen, chart reviewed and case discussed with the patient mother was at bedside with patient consent and also spoke with the staff RN and Eliezer Lofts, LCSW in the unit. Patient mother reported she has 62 acute psychiatric hospitalization since she was 46 years old.  Patient was recently discharged from the behavioral [facility] on  06/26/2016, and has a follow-up appointment with Dr. Jordan Hawks at [facility] Next week. Patient reported she has a multiple psychosocial stressors financial difficulties family issues and her medication was not titrated fast enough which caused her increased symptoms of depression and suicidal attempt. Patient is willing to restart her medication Lamictal 25 mg twice daily and also requested Effexor XR 75 mg which can be started as early as today as patient is currently medically stable as per primary team. LCSW has been contacting behavioral health for appropriate inpatient psychiatric placement as of yesterday.   Risk to Self: Is patient at risk for suicide?: YES Risk to Others:   Prior Inpatient Therapy:   Prior Outpatient Therapy:    Past Medical History:  Past Medical History:  Diagnosis Date  . Acne   . Adrenal insufficiency ([place])   . Bipolar 2 disorder ([place])   . Cellulitis   . Depression   . Genital warts   . Headache   . History of chicken pox   . Hx of Lyme disease   . Hypothyroidism     Past Surgical History:  Procedure Laterality Date  . CESAREAN SECTION  2003, 2007, 2011  . INGUINAL HERNIA REPAIR Bilateral   . TONSILLECTOMY    . UMBILICAL HERNIA REPAIR     Family History:  Family History  Problem Relation Age of Onset  . Other Mother     PAD  . Heart disease Other     Grandparent  . Alcohol abuse Paternal Grandmother   . Mental retardation Paternal Grandmother   . Arthritis Maternal Grandfather   . Heart disease Maternal Grandfather    Family Psychiatric  History: non contributory  Social History:  History  Alcohol Use  . Yes    Comment: social drinker-few drinks a week      History  Drug Use No    Social History   Social History  . Marital status: Single    Spouse name: N/A  . Number of children: N/A  . Years of education: 70   Occupational History  . HOMEMAKER    Social History Main Topics  . Smoking status: Current Every Day Smoker     Packs/day: 1.50    Years: 29.00    Types: Cigarettes  . Smokeless tobacco: Never Used  . Alcohol use Yes     Comment: social drinker-few drinks a week   . Drug use: No  . Sexual activity: Not Currently   Other Topics Concern  . None   Social History Narrative   Regular exercise-no   Additional Social History:    Allergies:   Allergies  Allergen Reactions  . Stadol [Butorphanol] Other (See Comments)    Dependence  . Vancomycin Itching  . Penicillins Hives and Rash  . Sulfa Antibiotics Rash    Labs:  Results for orders placed or performed during the hospital encounter of 07/08/16 (from the past 48 hour(s))  Carbamazepine level, total     Status: Abnormal   Collection Time: 07/09/16  2:59 PM  Result Value Ref Range   Carbamazepine Lvl 12.9 (H) 4.0 - 12.0 ug/mL  Basic metabolic panel     Status: Abnormal   Collection Time: 07/10/16  2:25 AM  Result Value Ref Range   Sodium 140 135 - 145 mmol/L   Potassium 3.6 3.5 - 5.1 mmol/L   Chloride 108 101 - 111 mmol/L   CO2 24 22 - 32  mmol/L   Glucose, Bld 108 (H) 65 - 99 mg/dL   BUN 6 6 - 20 mg/dL   Creatinine, Ser 0.60 0.44 - 1.00 mg/dL   Calcium 8.4 (L) 8.9 - 10.3 mg/dL   GFR calc non Af Amer >60 >60 mL/min   GFR calc Af Amer >60 >60 mL/min    Comment: (NOTE) The eGFR has been calculated using the CKD EPI equation. This calculation has not been validated in all clinical situations. eGFR's persistently <60 mL/min signify possible Chronic Kidney Disease.    Anion gap 8 5 - 15  Carbamazepine level, total     Status: None   Collection Time: 07/10/16  2:25 AM  Result Value Ref Range   Carbamazepine Lvl 9.8 4.0 - 12.0 ug/mL  Magnesium     Status: None   Collection Time: 07/10/16  2:25 AM  Result Value Ref Range   Magnesium 1.9 1.7 - 2.4 mg/dL  Phosphorus     Status: None   Collection Time: 07/10/16  2:25 AM  Result Value Ref Range   Phosphorus 2.8 2.5 - 4.6 mg/dL  TSH     Status: Abnormal   Collection Time: 07/10/16   2:25 AM  Result Value Ref Range   TSH 0.030 (L) 0.350 - 4.500 uIU/mL    Comment: Performed by a 3rd Generation assay with a functional sensitivity of <=0.01 uIU/mL.  CK     Status: Abnormal   Collection Time: 07/10/16  9:08 AM  Result Value Ref Range   Total CK 1,446 (H) 38 - 234 U/L  Lactic acid, plasma     Status: None   Collection Time: 07/10/16 12:17 PM  Result Value Ref Range   Lactic Acid, Venous 0.9 0.5 - 1.9 mmol/L  Magnesium     Status: Abnormal   Collection Time: 07/11/16  4:23 AM  Result Value Ref Range   Magnesium 1.6 (L) 1.7 - 2.4 mg/dL  Phosphorus     Status: None   Collection Time: 07/11/16  4:23 AM  Result Value Ref Range   Phosphorus 3.5 2.5 - 4.6 mg/dL  CK     Status: Abnormal   Collection Time: 07/11/16  4:23 AM  Result Value Ref Range   Total CK 572 (H) 38 - 234 U/L    Current Facility-Administered Medications  Medication Dose Route Frequency Provider Last Rate Last Dose  . acetaminophen (TYLENOL) tablet 650 mg  650 mg Oral Q6H PRN Norman Herrlich, MD   650 mg at 07/10/16 1108   Or  . acetaminophen (TYLENOL) suppository 650 mg  650 mg Rectal Q6H PRN Norman Herrlich, MD      . dextrose 5 %-0.45 % sodium chloride infusion   Intravenous Continuous Ledell Noss, MD 125 mL/hr at 07/11/16 [phone]    . enoxaparin (LOVENOX) injection 40 mg  40 mg Subcutaneous Daily Norman Herrlich, MD   40 mg at 07/11/16 0855  . folic acid (FOLVITE) tablet 1 mg  1 mg Oral Daily Assunta Found Stone, RPH   1 mg at 07/11/16 0855  . ibuprofen (ADVIL,MOTRIN) 100 MG/5ML suspension 400 mg  400 mg Oral Q8H PRN Rigoberto Noel, MD   400 mg at 07/11/16 0929  . LORazepam (ATIVAN) injection 2 mg  2 mg Intravenous Q5 min PRN Norman Herrlich, MD      . pantoprazole (PROTONIX) EC tablet 40 mg  40 mg Oral QPM Assunta Found Stone, RPH   40 mg at 07/10/16 1745  . thiamine (VITAMIN  B-1) tablet 100 mg  100 mg Oral Daily Ricka Burdock, RPH   100 mg at 07/11/16 4970    Musculoskeletal: Strength & Muscle Tone:  decreased Gait & Station: unable to stand Patient leans: N/A  Psychiatric Specialty Exam: Physical Exam  ROS feels vaguely nauseous, weak   Blood pressure 116/71, pulse 66, temperature 98.1 F (36.7 C), temperature source Oral, resp. rate 18, height 5' 2"  (1.575 m), weight 46.3 kg (102 lb), SpO2 99 %.Body mass index is 18.66 kg/m.  General Appearance: Fairly Groomed  Eye Contact:  Good  Speech:  slow , slurred, but able to make self understood  Volume:  Decreased  Mood:  Anxious and Depressed  Affect:  labile, briefly tearful at times   Thought Process:  Linear and Descriptions of Associations: Intact- slowed  Orientation:  Other:  oriented to [place], hospital, but not name of it, to April, but stated 2008 rfather than 2018.   Thought Content:  denies hallucinations, does not appear internally preoccupied   Suicidal Thoughts:  reports intermittent suicidal ideations, not at this present time  Homicidal Thoughts:  No  Memory:  recent and remote fair, limited recollection of overdose   Judgement:  Impaired  Insight:  Fair  Psychomotor Activity:  Decreased  Concentration:  Concentration: Fair and Attention Span: Fair  Recall:  [employer] of Knowledge:  Good  Language:  Fair  Akathisia:  Negative  Handed:  Right  AIMS (if indicated):     Assets:  Desire for Improvement Resilience Social Support  ADL's:  fair  Cognition:  Impaired,  Mild  Sleep:        Treatment Plan Summary: see below  Disposition: Recommend psychiatric Inpatient admission when medically cleared.   Continue one to one sitter for safety  We start lamotrigine 25 mg twice daily for mood stabilization, reportedly she was well tolerated her medication lamotrigine 25 mg for the last 3 weeks and Effexor XR 75 mg daily morning starting tomorrow for depression  Based on current evaluation , patient will require inpatient psychiatric admission once medically cleared .  Ambrose Finland, MD 07/11/2016  12:17 PM

## 2016-07-11 NOTE — Progress Notes (Signed)
Patient ID: Tina [name], female   DOB: 1971-01-10, 46 y.o.   MRN: [id number]  PROGRESS NOTE    Tina [name]  WUJ:[id number] DOB: 12-02-1970 DOA: 07/08/2016 PCP: No PCP Per Patient   Brief Narrative:  46 year old female with significant psychiatric history of bipolar disorder, history of suicidal suicide attempt and polysubstance abuse presented with the lethargy and slurred speech. She was found to have elevated Tegretol level and was admitted to ICU with diagnosis of Tegretol toxicity. She has been evaluated by psychiatry. Her mental status is improving. She has been moved out of ICU. She has had serial EKGs monitored showing sinus rhythm. She had initially refused inpatient psychiatric admission.  Assessment & Plan:   Principal Problem:   Tegretol toxicity Active Problems:   Hypothyroidism   Alcohol use disorder, severe, dependence (HCC)   Bipolar 1 disorder, mixed (HCC)   Bipolar affective disorder, current episode severe (HCC)   Electrolyte imbalance  1. Acute encephalopathy most likely toxic from Tegretol overdose: Mental status improving. Continue monitoring mental status.  2. Tegretol overdose: Tegretol level normalized. Awaiting psychiatry's evaluation for probable inpatient psychiatric admission. Patient is currently medically stable for transfer. Physical therapy evaluation.  3. History of bipolar disorder: Awaiting psychiatric evaluation.  4. Leukocytosis probably reactive: Repeat CBC in a.m.  5. Elevated CK level: Unknown cause; improving; repeat level in a.m.  6. Hypomagnesemia: Replace; repeat lab in a.m.   DVT prophylaxis: Lovenox Code Status: Full Family Communication: Spoke to mother present at bedside Disposition Plan: Probable discharge to inpatient psychiatric unit  Consultants: psychiatry   Procedures: None  Antimicrobials: None Subjective: patient seen and examined at bedside. She is awake and answering some questions. She denies any  fever nausea vomiting. She still complains of some left upper and lower extremity pain Objective: Vitals:   07/10/16 1100 07/10/16 1537 07/10/16 2147 07/11/16 0500  BP:  120/64 116/60 116/71  Pulse:  72 69 66  Resp: Temp:  98 F (36.7 C) 98 F (36.7 C) 98.1 F (36.7 C)  TempSrc:  Oral Oral Oral  SpO2:  100% 99% 99%  Weight:      Height:        Intake/Output Summary (Last 24 hours) at 07/11/16 1412 Last data filed at 07/11/16 0900  Gross per 24 hour  Intake          1491.67 ml  Output                0 ml  Net          1491.67 ml   Filed Weights   07/08/16 2325  Weight: 46.3 kg (102 lb)    Examination:  General exam: Appears calm and comfortable  Respiratory system: Clear to auscultation. Respiratory effort normal. Cardiovascular system: S1 & S2 heard, RRR.  Gastrointestinal system: Abdomen is nondistended, soft and nontender. No organomegaly or masses felt. Normal bowel sounds heard. Central nervous system: Alert and oriented. No focal neurological deficits. Extremities: No cyanosis clubbing edema   Data Reviewed: I have personally reviewed following labs and imaging studies  CBC:  Recent Labs Lab 07/08/16 2358 07/09/16 0458  WBC  --  11.0*  HGB 13.3 12.5  HCT 39.0 36.3  MCV  --  96.3  PLT  --  172   Basic Metabolic Panel:  Recent Labs Lab 07/08/16 2326 07/08/16 2358 07/09/16 0458 07/10/16 0225 07/11/16 0423  NA 138 141 141 140  --   K 3.9 3.4* 4.1  3.6  --   CL 103 107 109 108  --   CO2 25  --  22 24  --   GLUCOSE 140* 114* 90 108*  --   BUN --   CREATININE 0.75 0.40* 0.57 0.60  --   CALCIUM 9.4  --  8.5* 8.4*  --   MG  --   --  1.6* 1.9 1.6*  PHOS  --   --   --  2.8 3.5   GFR: Estimated Creatinine Clearance: 66.3 mL/min (by C-G formula based on SCr of 0.6 mg/dL). Liver Function Tests:  Recent Labs Lab 07/08/16 2326  AST 61*  ALT 28  ALKPHOS 54  BILITOT 0.6  PROT 6.5  ALBUMIN 3.8   No results for input(s):  LIPASE, AMYLASE in the last 168 hours. No results for input(s): AMMONIA in the last 168 hours. Coagulation Profile: No results for input(s): INR, PROTIME in the last 168 hours. Cardiac Enzymes:  Recent Labs Lab 07/10/16 0908 07/11/16 0423  CKTOTAL 1,446* 572*   BNP (last 3 results) No results for input(s): PROBNP in the last 8760 hours. HbA1C: No results for input(s): HGBA1C in the last 72 hours. CBG:  Recent Labs Lab 07/08/16 2351  GLUCAP 106*   Lipid Profile: No results for input(s): CHOL, HDL, LDLCALC, TRIG, CHOLHDL, LDLDIRECT in the last 72 hours. Thyroid Function Tests:  Recent Labs  07/08/16 2326  07/10/16 0225  TSH  --   < > 0.030*  FREET4 0.69  --   --   < > = values in this interval not displayed. Anemia Panel: No results for input(s): VITAMINB12, FOLATE, FERRITIN, TIBC, IRON, RETICCTPCT in the last 72 hours. Sepsis Labs:  Recent Labs Lab 07/10/16 1217  LATICACIDVEN 0.9    Recent Results (from the past 240 hour(s))  MRSA PCR Screening     Status: None   Collection Time: 07/09/16  5:44 AM  Result Value Ref Range Status   MRSA by PCR NEGATIVE NEGATIVE Final    Comment:        The GeneXpert MRSA Assay (FDA approved for NASAL specimens only), is one component of a comprehensive MRSA colonization surveillance program. It is not intended to diagnose MRSA infection nor to guide or monitor treatment for MRSA infections.          Radiology Studies: No results found.      Scheduled Meds: . enoxaparin (LOVENOX) injection  40 mg Subcutaneous Daily  . folic acid  1 mg Oral Daily  . pantoprazole  40 mg Oral QPM  . thiamine  100 mg Oral Daily   Continuous Infusions: . dextrose 5 % and 0.45% NaCl 125 mL/hr at 07/11/16 1234     LOS: 2 days       Glade Lloyd, MD [facility] [phone] 7PM-7AM, please contact night-coverage [email or web address] Password [email or web address] 07/11/2016, 2:12 PM

## 2016-07-11 NOTE — Evaluation (Signed)
Physical Therapy Evaluation Patient Details Name: Tina [name] MRN: [id number] DOB: Feb 06, 1971 Today's Date: 07/11/2016   History of Present Illness  pt is a 46 y/o female with pmh significant for psychiatric hx including bipolar d/o, polysubstance/ETOH abuse, suicidal attempts, admitted to ED in a lethargic state with muffle/slurred speech and only responsive to painful stimuli.  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  Clinical Impression  Pt admitted with/for complication of tegretol toxicity.  Pt currently limited functionally due to the problems listed. ( See problems list.)   Pt will benefit from PT to maximize function and safety in order to get ready for next venue listed below.      Follow Up Recommendations No PT follow up    Equipment Recommendations  None recommended by PT    Recommendations for Other Services       Precautions / Restrictions Precautions Precautions: Fall      Mobility  Bed Mobility Overal bed mobility: Modified Independent                Transfers Overall transfer level: Modified independent                  Ambulation/Gait Ambulation/Gait assistance: Supervision Ambulation Distance (Feet): 100 Feet Assistive device: None (vs holding to IV pole) Gait Pattern/deviations: Step-through pattern Gait velocity: slower Gait velocity interpretation: Below normal speed for age/gender General Gait Details: pt midly unsteady whether with/without the IV pole, but she felt overwhelmed with dizziness  Stairs            Wheelchair Mobility    Modified Rankin (Stroke Patients Only)       Balance Overall balance assessment: Needs assistance   Sitting balance-Leahy Scale: Good     Standing balance support: No upper extremity supported Standing balance-Leahy Scale: Fair                               Pertinent Vitals/Pain Pain Assessment: Faces Faces Pain Scale: Hurts a little bit Pain Location: left hip/side  with movement Pain Intervention(s): Premedicated before session;Monitored during session    Home Living Family/patient expects to be discharged to:: Other (Comment) (hopefully to [facility]) Living Arrangements: Alone Available Help at Discharge: Family;Available PRN/intermittently Type of Home: House       Home Layout: Two level        Prior Function Level of Independence: Independent               Hand Dominance        Extremity/Trunk Assessment   Upper Extremity Assessment Upper Extremity Assessment: Overall WFL for tasks assessed    Lower Extremity Assessment Lower Extremity Assessment: Overall WFL for tasks assessed (mild weakness proximally)       Communication   Communication: No difficulties  Cognition Arousal/Alertness: Awake/alert Behavior During Therapy: Flat affect;Anxious (tearful) Overall Cognitive Status: Within Functional Limits for tasks assessed                                        General Comments General comments (skin integrity, edema, etc.): pt reports L hip pain that is better after taking tylenol, but she is still worried.  Her description leads me to believe pain stems from strain other than nerve related pain.    Exercises     Assessment/Plan    PT Assessment Patient needs continued PT  services  PT Problem List Decreased strength;Decreased activity tolerance;Decreased balance;Decreased mobility;Pain       PT Treatment Interventions Gait training;Therapeutic activities;Therapeutic exercise;Patient/family education    PT Goals (Current goals can be found in the Care Plan section)  Acute Rehab PT Goals Patient Stated Goal: decreased dizziness.  Feeling better. PT Goal Formulation: With patient Time For Goal Achievement: 07/18/16 Potential to Achieve Goals: Good    Frequency Min 1X/week   Barriers to discharge        Co-evaluation               AM-PAC PT "6 Clicks" Daily Activity  Outcome Measure  Difficulty turning over in bed (including adjusting bedclothes, sheets and blankets)?: None Difficulty moving from lying on back to sitting on the side of the bed? : None Difficulty sitting down on and standing up from a chair with arms (e.g., wheelchair, bedside commode, etc,.)?: A Little Help needed moving to and from a bed to chair (including a wheelchair)?: A Little Help needed walking in hospital room?: A Little Help needed climbing 3-5 steps with a railing? : A Little 6 Click Score: 20    End of Session   Activity Tolerance: Patient tolerated treatment well Patient left: in bed;with call bell/phone within reach;with family/visitor present;with nursing/sitter in room Nurse Communication: Mobility status PT Visit Diagnosis: Unsteadiness on feet (R26.81)    Time: 1191-4782 PT Time Calculation (min) (ACUTE ONLY): 30 min   Charges:   PT Evaluation $PT Eval Moderate Complexity: 1 Procedure PT Treatments $Gait Training: 8-22 mins   PT G Codes:        07-18-2016  Peabody Bing, PT [phone]  (pager)  Eliseo Gum Mekala Winger 2016/07/18, 4:23 PM

## 2016-07-11 NOTE — Progress Notes (Signed)
Tina [name],has a blister to her right inner leg and c/o of itching,and states in was smaller yesterday. MD notified  And orders received.

## 2016-07-11 NOTE — Progress Notes (Signed)
CSW met with pt and dtr to explain Behavioral hospital referral process- pt and mom is familiar due to long history of admissions.  Pt mom inquiring about longer term [facility] placement- CSW confirmed with co-worker that long term treatment options do not exist in this patients situation  Referral process started- no beds at this time  CSW will continue to follow  Jorge Ny, [place] Social Worker [phone]

## 2016-07-12 ENCOUNTER — Encounter (HOSPITAL_COMMUNITY): Payer: Self-pay | Admitting: *Deleted

## 2016-07-12 ENCOUNTER — Inpatient Hospital Stay (HOSPITAL_COMMUNITY)
Admission: AD | Admit: 2016-07-12 | Discharge: 2016-07-29 | DRG: 885 | Disposition: A | Discharge status: Home / Self Care | Payer: Medicaid Other | Source: Intra-hospital | Attending: Psychiatry | Admitting: Psychiatry

## 2016-07-12 DIAGNOSIS — E039 Hypothyroidism, unspecified: Secondary | ICD-10-CM | POA: Diagnosis present

## 2016-07-12 DIAGNOSIS — F329 Major depressive disorder, single episode, unspecified: Secondary | ICD-10-CM | POA: Diagnosis present

## 2016-07-12 DIAGNOSIS — Z79899 Other long term (current) drug therapy: Secondary | ICD-10-CM

## 2016-07-12 DIAGNOSIS — F1099 Alcohol use, unspecified with unspecified alcohol-induced disorder: Secondary | ICD-10-CM | POA: Diagnosis not present

## 2016-07-12 DIAGNOSIS — F3130 Bipolar disorder, current episode depressed, mild or moderate severity, unspecified: Secondary | ICD-10-CM | POA: Diagnosis not present

## 2016-07-12 DIAGNOSIS — F339 Major depressive disorder, recurrent, unspecified: Secondary | ICD-10-CM | POA: Diagnosis not present

## 2016-07-12 DIAGNOSIS — F1020 Alcohol dependence, uncomplicated: Secondary | ICD-10-CM | POA: Diagnosis present

## 2016-07-12 DIAGNOSIS — F332 Major depressive disorder, recurrent severe without psychotic features: Secondary | ICD-10-CM | POA: Diagnosis present

## 2016-07-12 DIAGNOSIS — Z811 Family history of alcohol abuse and dependence: Secondary | ICD-10-CM | POA: Diagnosis not present

## 2016-07-12 DIAGNOSIS — Z818 Family history of other mental and behavioral disorders: Secondary | ICD-10-CM | POA: Diagnosis not present

## 2016-07-12 DIAGNOSIS — R5382 Chronic fatigue, unspecified: Secondary | ICD-10-CM | POA: Diagnosis present

## 2016-07-12 DIAGNOSIS — F3160 Bipolar disorder, current episode mixed, unspecified: Principal | ICD-10-CM | POA: Diagnosis present

## 2016-07-12 DIAGNOSIS — F17210 Nicotine dependence, cigarettes, uncomplicated: Secondary | ICD-10-CM | POA: Diagnosis present

## 2016-07-12 DIAGNOSIS — F4310 Post-traumatic stress disorder, unspecified: Secondary | ICD-10-CM | POA: Diagnosis present

## 2016-07-12 DIAGNOSIS — Z810 Family history of intellectual disabilities: Secondary | ICD-10-CM | POA: Diagnosis not present

## 2016-07-12 DIAGNOSIS — E2740 Unspecified adrenocortical insufficiency: Secondary | ICD-10-CM | POA: Diagnosis not present

## 2016-07-12 DIAGNOSIS — T421X4A Poisoning by iminostilbenes, undetermined, initial encounter: Secondary | ICD-10-CM | POA: Diagnosis not present

## 2016-07-12 DIAGNOSIS — F314 Bipolar disorder, current episode depressed, severe, without psychotic features: Secondary | ICD-10-CM | POA: Diagnosis not present

## 2016-07-12 DIAGNOSIS — T1491XA Suicide attempt, initial encounter: Secondary | ICD-10-CM | POA: Diagnosis not present

## 2016-07-12 DIAGNOSIS — T421X2A Poisoning by iminostilbenes, intentional self-harm, initial encounter: Secondary | ICD-10-CM | POA: Diagnosis not present

## 2016-07-12 DIAGNOSIS — E878 Other disorders of electrolyte and fluid balance, not elsewhere classified: Secondary | ICD-10-CM | POA: Diagnosis not present

## 2016-07-12 DIAGNOSIS — F411 Generalized anxiety disorder: Secondary | ICD-10-CM | POA: Diagnosis present

## 2016-07-12 DIAGNOSIS — A6920 Lyme disease, unspecified: Secondary | ICD-10-CM | POA: Diagnosis not present

## 2016-07-12 DIAGNOSIS — R21 Rash and other nonspecific skin eruption: Secondary | ICD-10-CM | POA: Diagnosis not present

## 2016-07-12 LAB — CBC WITH DIFFERENTIAL/PLATELET
Basophils Absolute: 0 10*3/uL (ref 0.0–0.1)
Basophils Relative: 0 %
Eosinophils Absolute: 0.1 10*3/uL (ref 0.0–0.7)
Eosinophils Relative: 1 %
HEMATOCRIT: 31.4 % — AB (ref 36.0–46.0)
Hemoglobin: 11 g/dL — ABNORMAL LOW (ref 12.0–15.0)
LYMPHS ABS: 1.8 10*3/uL (ref 0.7–4.0)
LYMPHS PCT: 42 %
MCH: 32.8 pg (ref 26.0–34.0)
MCHC: 35 g/dL (ref 30.0–36.0)
MCV: 93.7 fL (ref 78.0–100.0)
MONO ABS: 0.3 10*3/uL (ref 0.1–1.0)
MONOS PCT: 8 %
NEUTROS ABS: 2.1 10*3/uL (ref 1.7–7.7)
Neutrophils Relative %: 49 %
Platelets: 150 10*3/uL (ref 150–400)
RBC: 3.35 MIL/uL — ABNORMAL LOW (ref 3.87–5.11)
RDW: 11.9 % (ref 11.5–15.5)
WBC: 4.3 10*3/uL (ref 4.0–10.5)

## 2016-07-12 LAB — BASIC METABOLIC PANEL
ANION GAP: 4 — AB (ref 5–15)
BUN: <5 mg/dL — ABNORMAL LOW (ref 6–20)
CALCIUM: 8.2 mg/dL — AB (ref 8.9–10.3)
CO2: 25 mmol/L (ref 22–32)
CREATININE: 0.45 mg/dL (ref 0.44–1.00)
Chloride: 109 mmol/L (ref 101–111)
GFR calc Af Amer: >60 mL/min (ref >60)
GFR calc non Af Amer: >60 mL/min (ref >60)
GLUCOSE: 99 mg/dL (ref 65–99)
Potassium: 3.5 mmol/L (ref 3.5–5.1)
Sodium: 138 mmol/L (ref 135–145)

## 2016-07-12 LAB — MAGNESIUM: Magnesium: 1.6 mg/dL — ABNORMAL LOW (ref 1.7–2.4)

## 2016-07-12 LAB — PHOSPHORUS: Phosphorus: 3.7 mg/dL (ref 2.5–4.6)

## 2016-07-12 LAB — CK: Total CK: 301 U/L — ABNORMAL HIGH (ref 38–234)

## 2016-07-12 MED ORDER — MUPIROCIN CALCIUM 2 % EX CREA: TOPICAL_CREAM | Freq: Two times a day (BID) | CUTANEOUS | 0 refills | Status: DC

## 2016-07-12 MED ORDER — HYDROXYZINE HCL 25 MG PO TABS
25.0000 mg | ORAL_TABLET | Freq: Three times a day (TID) | ORAL | Status: DC | PRN
  Administered 2016-07-13 – 2016-07-25 (×7): 25 mg via ORAL
  Filled 2016-07-12 (×7): qty 1

## 2016-07-12 MED ORDER — NICOTINE 21 MG/24HR TD PT24
21.0000 mg | MEDICATED_PATCH | Freq: Every day | TRANSDERMAL | Status: DC
  Administered 2016-07-17 – 2016-07-29 (×11): 21 mg via TRANSDERMAL
  Filled 2016-07-12 (×21): qty 1

## 2016-07-12 MED ORDER — GUAIFENESIN-DM 100-10 MG/5ML PO SYRP
5.0000 mL | ORAL_SOLUTION | ORAL | Status: DC | PRN
  Administered 2016-07-12: 5 mL via ORAL
  Filled 2016-07-12: qty 5

## 2016-07-12 MED ORDER — MAGNESIUM OXIDE 400 (241.3 MG) MG PO TABS: 800.0000 mg | ORAL_TABLET | Freq: Once | ORAL | Status: DC

## 2016-07-12 MED ORDER — ACETAMINOPHEN 325 MG PO TABS
650.0000 mg | ORAL_TABLET | Freq: Four times a day (QID) | ORAL | Status: DC | PRN
Start: 2016-07-12 — End: 2016-07-29
  Administered 2016-07-14 – 2016-07-24 (×16): 650 mg via ORAL
  Filled 2016-07-12 (×17): qty 2

## 2016-07-12 MED ORDER — TRAZODONE HCL 50 MG PO TABS
50.0000 mg | ORAL_TABLET | Freq: Every evening | ORAL | Status: DC | PRN
  Administered 2016-07-14 – 2016-07-28 (×15): 50 mg via ORAL
  Filled 2016-07-12 (×15): qty 1

## 2016-07-12 MED ORDER — ALUM & MAG HYDROXIDE-SIMETH 200-200-20 MG/5ML PO SUSP: 30.0000 mL | ORAL | Status: DC | PRN

## 2016-07-12 MED ORDER — LAMOTRIGINE 25 MG PO TABS: 25.0000 mg | ORAL_TABLET | Freq: Two times a day (BID) | ORAL | 0 refills | Status: DC

## 2016-07-12 MED ORDER — MAGNESIUM HYDROXIDE 400 MG/5ML PO SUSP
30.0000 mL | Freq: Every day | ORAL | Status: DC | PRN
  Administered 2016-07-23 – 2016-07-26 (×2): 30 mL via ORAL
  Filled 2016-07-12 (×2): qty 30

## 2016-07-12 NOTE — Progress Notes (Signed)
Patient did not attend the evening group.

## 2016-07-12 NOTE — Progress Notes (Signed)
[name] to be D/C'd [facility] per MD order.  Discussed with the patient and all questions fully answered.  VSS, Skin clean, dry and intact without evidence of skin break down, no evidence of skin tears noted. IV catheter discontinued intact. Site without signs and symptoms of complications. Dressing and pressure applied.  An After Visit Summary was printed and given to the patient.  D/c education completed with patient/family including follow up instructions, medication list, d/c activities limitations if indicated, with other d/c instructions as indicated by MD - patient able to verbalize understanding, all questions fully answered.   Patient instructed to return to ED, call 911, or call MD for any changes in condition.   Patient escorted via WC, and D/C to [facility] via [facility] transport.  Casper Harrison [place] 07/12/2016 1:06 PM

## 2016-07-12 NOTE — Progress Notes (Signed)
D: Pt passive SI-Contract for safety, pt sleep in room majority of the evening.  A: Pt was offered support and encouragement. Pt was given scheduled medications. Pt was encourage to attend groups. Q 15 minute checks were done for safety.   R: safety maintained on unit.

## 2016-07-12 NOTE — Tx Team (Signed)
Initial Treatment Plan 07/12/2016 3:50 PM [name] WUJ:[id number]    PATIENT STRESSORS: Financial difficulties Occupational concerns   PATIENT STRENGTHS: Average or above average intelligence Capable of independent living Supportive family/friends   PATIENT IDENTIFIED PROBLEMS: Unemployment  Financial difficulties  Anxiety  Depression  "I don't want to be here"  Suicide risk           DISCHARGE CRITERIA:  Improved stabilization in mood, thinking, and/or behavior Motivation to continue treatment in a less acute level of care Reduction of life-threatening or endangering symptoms to within safe limits Verbal commitment to aftercare and medication compliance  PRELIMINARY DISCHARGE PLAN: Outpatient therapy Return to previous living arrangement  PATIENT/FAMILY INVOLVEMENT: This treatment plan has been presented to and reviewed with the patient, Tina [name].  The patient and family have been given the opportunity to ask questions and make suggestions.  Cranford Mon, RN 07/12/2016, 3:50 PM

## 2016-07-12 NOTE — Progress Notes (Addendum)
Patient will DC to: [facility] Anticipated DC date: 07/12/16 Family notified: Patient alerting family Transport by: [facility] 12:30pm   Per MD patient ready for DC to [facility]. RN, patient, patient's family, and facility notified of DC.   CSW signing off.  Cristobal Goldmann, LCSWA Clinical Social Worker [phone]     Patient can admit to [facility] today for inpatient treatment  Patient will go to Rm 402 Bed 1 Admitted under Dr. Jama Flavors  RN to call report to [phone]  CSW to set up [facility] transport once DC summary complete  Burna Sis, LCSW Clinical Social Worker [phone]

## 2016-07-12 NOTE — Progress Notes (Signed)
Pt reports blurry vision to right eye &  swelling, pt has that blister to right leg, also left hand swelling, IVF stop, Telemetry d/c

## 2016-07-12 NOTE — Discharge Summary (Signed)
Physician Discharge Summary  Tina [name] VOZ:[id number] DOB: [birth year] DOA: 07/08/2016  PCP: No PCP Per Patient  Admit date: 07/08/2016 Discharge date: 07/12/2016  Admitted From: Home Disposition: [facility]  Recommendations for Outpatient Follow-up:  1. Follow up with [facility] at earliest convenience  Home Health: No Equipment/Devices: None Discharge Condition: Stable CODE STATUS: Full Diet recommendation: Regular Brief/Interim Summary: 46 year old female with significant psychiatric history of bipolar disorder, history of suicidal suicide attempt and polysubstance abuse presented with the lethargy and slurred speech. She was found to have elevated Tegretol level and was admitted to ICU with diagnosis of Tegretol toxicity. She has been evaluated by psychiatry. Her mental status is improving. She has been moved out of ICU. She has had serial EKGs monitored showing sinus rhythm. She is medically stable to be discharged to [facility]. She has been placed back on Lamictal and Effexor by Psychiatry.  Discharge Diagnoses:  Principal Problem:   Tegretol toxicity Active Problems:   Hypothyroidism   Alcohol use disorder, severe, dependence (HCC)   Bipolar 1 disorder, mixed (HCC)   Bipolar affective disorder, current episode severe (HCC)   Electrolyte imbalance  1. Acute encephalopathy most likely toxic from Tegretol overdose: Mental status improved. Continue monitoring mental status.  2. Tegretol overdose: Tegretol level normalized.  Patient is currently medically stable for transfer. Transfer to [facility] today as per psychiatry recommendations.  3. History of bipolar disorder: on Lamictal and Effexor as per Psychiatry. Patient has a blister on her right shin; monitor for skin rash from Lamictal; follow up with psychiatry.  4. Leukocytosis probably reactive: Resolved  5. Elevated CK level: Unknown cause; improved  6. Hypomagnesemia  Discharge Instructions  Discharge  Instructions    Call MD for:  difficulty breathing, headache or visual disturbances    Complete by:  As directed    Call MD for:  hives    Complete by:  As directed    Call MD for:  persistant nausea and vomiting    Complete by:  As directed    Call MD for:  redness, tenderness, or signs of infection (pain, swelling, redness, odor or green/yellow discharge around incision site)    Complete by:  As directed    Call MD for:  severe uncontrolled pain    Complete by:  As directed    Call MD for:  temperature >100.4    Complete by:  As directed    Diet general    Complete by:  As directed    Increase activity slowly    Complete by:  As directed      Allergies as of 07/12/2016      Reactions   Stadol [butorphanol] Other (See Comments)   Dependence   Vancomycin Itching   Penicillins Hives, Rash   Sulfa Antibiotics Rash      Medication List    STOP taking these medications   hydrOXYzine 50 MG tablet Commonly known as:  ATARAX/VISTARIL   metroNIDAZOLE 500 MG tablet Commonly known as:  FLAGYL   midodrine 5 MG tablet Commonly known as:  PROAMATINE   temazepam 30 MG capsule Commonly known as:  RESTORIL   traZODone 150 MG tablet Commonly known as:  DESYREL     TAKE these medications   lamoTRIgine 25 MG tablet Commonly known as:  LAMICTAL Take 1 tablet (25 mg total) by mouth 2 (two) times daily. What changed:  when to take this   mupirocin cream 2 % Commonly known as:  BACTROBAN Apply topically 2 (two)  times daily.   thyroid 90 MG tablet Commonly known as:  ARMOUR Take 1 tablet (90 mg total) by mouth daily before breakfast.   venlafaxine XR 75 MG 24 hr capsule Commonly known as:  EFFEXOR-XR Take 1 capsule (75 mg total) by mouth daily with breakfast.       Allergies  Allergen Reactions  . Stadol [Butorphanol] Other (See Comments)    Dependence  . Vancomycin Itching  . Penicillins Hives and Rash  . Sulfa Antibiotics Rash     Consultations: Psychiatry  Procedures/Studies: Ct Head Wo Contrast  Result Date: 07/09/2016 CLINICAL DATA:  Altered mental status, incontinent. History of bipolar disorder. EXAM: CT HEAD WITHOUT CONTRAST TECHNIQUE: Contiguous axial images were obtained from the base of the skull through the vertex without intravenous contrast. COMPARISON:  CT HEAD Jul 24, 2011 FINDINGS: BRAIN: No intraparenchymal hemorrhage, mass effect nor midline shift. The ventricles and sulci are normal. No acute large vascular territory infarcts. Bilateral inferior basal ganglia perivascular spaces. No abnormal extra-axial fluid collections. Basal cisterns are patent. VASCULAR: Unremarkable. SKULL/SOFT TISSUES: No skull fracture. No significant soft tissue swelling. ORBITS/SINUSES: The included ocular globes and orbital contents are normal.Frothy secretions RIGHT sphenoid sinus Set, mild paranasal sinus mucosal thickening. Mastoid air cells are well aerated. OTHER: None. IMPRESSION: Normal CT HEAD. Electronically Signed   By: Awilda Metro M.D.   On: 07/09/2016 01:31   Dg Pelvis Portable  Result Date: 07/09/2016 CLINICAL DATA:  46 year old female with left hip pain. EXAM: PORTABLE PELVIS 1-2 VIEWS COMPARISON:  Left hip radiograph dated 07/08/2016 FINDINGS: There is no evidence of pelvic fracture or diastasis. No pelvic bone lesions are seen. A tubal ligation clip noted over the right hemipelvis. The soft tissues appear unremarkable. IMPRESSION: No acute/traumatic osseous pathology. Electronically Signed   By: Elgie Collard M.D.   On: 07/09/2016 00:28   Dg Chest Portable 1 View  Result Date: 07/09/2016 CLINICAL DATA:  46 year old female with altered mental status EXAM: PORTABLE CHEST 1 VIEW COMPARISON:  Chest radiograph dated 03/25/2016 FINDINGS: The heart size and mediastinal contours are within normal limits. Both lungs are clear. The visualized skeletal structures are unremarkable. IMPRESSION: No active disease.  Electronically Signed   By: Elgie Collard M.D.   On: 07/09/2016 00:25   Dg Femur Portable Min 2 Views Left  Result Date: 07/09/2016 CLINICAL DATA:  46 year old female with left leg pain. EXAM: LEFT FEMUR PORTABLE 2 VIEWS COMPARISON:  Pelvic radiograph dated 07/08/2016 FINDINGS: There is no evidence of fracture or other focal bone lesions. Soft tissues are unremarkable. IMPRESSION: Negative. Electronically Signed   By: Elgie Collard M.D.   On: 07/09/2016 00:27       Subjective: patient seen and examined at bedside. She is awake and answering some questions. She denies any fever nausea vomiting. She still complains of puffiness of right eye with mild blurry vision. She also has a blister on her Right shin and a rash on her left leg. Discharge Exam: Vitals:   07/11/16 2105 07/12/16 0553  BP: (!) 144/78 (!) 124/59  Pulse: 76 60  Resp: 17 20  Temp: 98.6 F (37 C) 98.2 F (36.8 C)   Vitals:   07/11/16 0500 07/11/16 1433 07/11/16 2105 07/12/16 0553  BP: 116/71 125/67 (!) 144/78 (!) 124/59  Pulse: 66 (!) 58 76 60  Resp: Temp: 98.1 F (36.7 C) 98.3 F (36.8 C) 98.6 F (37 C) 98.2 F (36.8 C)  TempSrc: Oral Oral Oral Oral  SpO2:  99% 100% 100% 100%  Weight:      Height:        General: Pt is alert, awake, not in acute distress Cardiovascular: RRR, S1/S2 +, no rubs, no gallops Respiratory: CTA bilaterally, no wheezing, no rhonchi Abdominal: Soft, NT, ND, bowel sounds + Extremities: no edema, no cyanosis Skin: Blister on Right shin without surrounding erythema; circular macular rash on Left leg.    The results of significant diagnostics from this hospitalization (including imaging, microbiology, ancillary and laboratory) are listed below for reference.     Microbiology: Recent Results (from the past 240 hour(s))  MRSA PCR Screening     Status: None   Collection Time: 07/09/16  5:44 AM  Result Value Ref Range Status   MRSA by PCR NEGATIVE NEGATIVE Final     Comment:        The GeneXpert MRSA Assay (FDA approved for NASAL specimens only), is one component of a comprehensive MRSA colonization surveillance program. It is not intended to diagnose MRSA infection nor to guide or monitor treatment for MRSA infections.      Labs: BNP (last 3 results) No results for input(s): BNP in the last 8760 hours. Basic Metabolic Panel:  Recent Labs Lab 07/08/16 2326 07/08/16 2358 07/09/16 0458 07/10/16 0225 07/11/16 0423 07/12/16 0336  NA 138 141 141 140  --  138  K 3.9 3.4* 4.1 3.6  --  3.5  CL 103 107 109 108  --  109  CO2 25  --  22 24  --  25  GLUCOSE 140* 114* 90 108*  --  99  BUN --  <5*  CREATININE 0.75 0.40* 0.57 0.60  --  0.45  CALCIUM 9.4  --  8.5* 8.4*  --  8.2*  MG  --   --  1.6* 1.9 1.6* 1.6*  PHOS  --   --   --  2.8 3.5 3.7   Liver Function Tests:  Recent Labs Lab 07/08/16 2326  AST 61*  ALT 28  ALKPHOS 54  BILITOT 0.6  PROT 6.5  ALBUMIN 3.8   No results for input(s): LIPASE, AMYLASE in the last 168 hours. No results for input(s): AMMONIA in the last 168 hours. CBC:  Recent Labs Lab 07/08/16 2358 07/09/16 0458 07/12/16 0336  WBC  --  11.0* 4.3  NEUTROABS  --   --  2.1  HGB 13.3 12.5 11.0*  HCT 39.0 36.3 31.4*  MCV  --  96.3 93.7  PLT  --  172 150   Cardiac Enzymes:  Recent Labs Lab 07/10/16 0908 07/11/16 0423 07/12/16 0336  CKTOTAL 1,446* 572* 301*   BNP: Invalid input(s): POCBNP CBG:  Recent Labs Lab 07/08/16 2351  GLUCAP 106*   D-Dimer No results for input(s): DDIMER in the last 72 hours. Hgb A1c No results for input(s): HGBA1C in the last 72 hours. Lipid Profile No results for input(s): CHOL, HDL, LDLCALC, TRIG, CHOLHDL, LDLDIRECT in the last 72 hours. Thyroid function studies  Recent Labs  07/10/16 0225  TSH 0.030*   Anemia work up No results for input(s): VITAMINB12, FOLATE, FERRITIN, TIBC, IRON, RETICCTPCT in the last 72 hours. Urinalysis    Component Value  Date/Time   COLORURINE YELLOW 07/08/2016 2335   APPEARANCEUR CLEAR 07/08/2016 2335   LABSPEC 1.014 07/08/2016 2335   PHURINE 5.0 07/08/2016 2335   GLUCOSEU NEGATIVE 07/08/2016 2335   HGBUR SMALL (A) 07/08/2016 2335   BILIRUBINUR NEGATIVE 07/08/2016 2335   KETONESUR NEGATIVE  07/08/2016 2335   PROTEINUR NEGATIVE 07/08/2016 2335   UROBILINOGEN 0.2 12/12/2014 1311   NITRITE NEGATIVE 07/08/2016 2335   LEUKOCYTESUR NEGATIVE 07/08/2016 2335   Sepsis Labs Invalid input(s): PROCALCITONIN,  WBC,  LACTICIDVEN Microbiology Recent Results (from the past 240 hour(s))  MRSA PCR Screening     Status: None   Collection Time: 07/09/16  5:44 AM  Result Value Ref Range Status   MRSA by PCR NEGATIVE NEGATIVE Final    Comment:        The GeneXpert MRSA Assay (FDA approved for NASAL specimens only), is one component of a comprehensive MRSA colonization surveillance program. It is not intended to diagnose MRSA infection nor to guide or monitor treatment for MRSA infections.      Time coordinating discharge: 35 minutes  SIGNED:   Glade Lloyd, MD  [facility] 07/12/2016, 10:53 AM Pager [phone]  If 7PM-7AM, please contact night-coverage [email or web address] Password [email or web address]

## 2016-07-12 NOTE — Progress Notes (Signed)
  Speech Language Pathology Treatment: Dysphagia  Patient Details Name: Tina [name] MRN: [id number] DOB: 1970/11/22 Today's Date: 07/12/2016 Time: 0820-0828 SLP Time Calculation (min) (ACUTE ONLY): 8 min  Assessment / Plan / Recommendation Clinical Impression  Pt observed with upgraded texture of regular and thin. Reviewed reasoning for need for softer textures initially. Normal oral and pharyngeal swallow. No cues needed. Continue regular and thin liquids. No f/u needed.    HPI HPI: 46 y/o F with significant psychiatric history including bipolar 1 with recent manic episode, now in depressive state, history of suicide attempt by cutting, polysubstance abuse including alcohol dependence/illicit drug use who presented from home with parents. She was found in bed incontinent per family with muffled/slurred speech and lethargy. EKG with sinus tachycardia at 117, QT 412 and without any ST segment changes to suggest acute ischemia. CMET wnl except for AST of 61. Carbamazepine level 19.5. CT head without acute process. CXR without active cardiopulmonary disease. Admitted for carbamazepine overdose. Referred for swallowing evaluation.      SLP Plan  Discharge SLP treatment due to (comment)       Recommendations  Diet recommendations: Regular;Thin liquid Liquids provided via: Cup;Straw Medication Administration: Whole meds with liquid Supervision: Patient able to self feed Compensations: Slow rate;Small sips/bites Postural Changes and/or Swallow Maneuvers: Seated upright 90 degrees                Oral Care Recommendations: Oral care BID Follow up Recommendations: None SLP Visit Diagnosis: Dysphagia, unspecified (R13.10) Plan: Discharge SLP treatment due to (comment)       GO                Royce Macadamia 07/12/2016, 8:30 AM  Breck Coons Lonell Face.[employer] [phone]

## 2016-07-12 NOTE — Progress Notes (Signed)
Admission note:  Patient is a 46 yo female admitted to [facility] after an intentional overdose of tegretol.  Patient states she was found at home "out of it."  She states she took the medication that she was given upon her last discharge from [facility].  Patient states, "I also took half a bottle of respirdal."  Patient's tegretol level was 19.5.  She was admitted to [facility] medical floor until medically cleared.  Patient was discharged from [facility] on 06/18/16.  Patient states her financial situation and unemployment triggered her overdose.  Patient denies any significant alcohol use, however, she has a dx of alcohol abuse.  Her UDS was positive for benzos.  She has a hx of bipolar disorder and hx of suicide attempts in the past.  She has a hx of opiate misuse also.  She also has a med hx of lyme disease, hypothyroidism, cannabis dependence.  Patient reports decreased sleep and concentration.  She states she lives alone and has 50/50 custody of her three children.  She states her mother and ex-husband are supportive.  Patient states, "I just don't want to be here."  She states she didn't mean "in the hospital, just not be here."  She denies any HI and does not appear to be responding to internal stimuli.  Patient is flat, blunted; she forwards little.  She was reoriented to room and unit.

## 2016-07-12 NOTE — Discharge Instructions (Addendum)
Follow up with Psychiatry as an outpatient.   Drug Overdose A drug overdose happens when you take too much of a drug. An overdose can occur with illegal drugs, prescription drugs, or over-the-counter (OTC) drugs. The effects of a drug overdose can be mild, dangerous, or even deadly. What are the causes? This condition may be caused by:  Taking too much of a drug by accident.  Taking too much of a drug on purpose.  An error made by a health care provider who prescribes a drug.  An error made by the pharmacist who fills the prescription order. Drugs that commonly cause overdose include:  Mental health drugs.  Pain medicines.  Illegal drugs.  OTC cough and cold medicines.  Heart medicines.  Seizure medicines. What increases the risk? A drug overdose is more likely in:  Children. They may be attracted to colorful pills. Because of a child's small size, even a small amount of a drug can be dangerous.  Elderly people. They may be taking many different drugs. Elderly people may have difficulty reading labels or remembering when they last took their medicine. The risk of a drug overdose is also higher for someone who:  Takes illegal drugs.  Takes a drug and drinks alcohol.  Has a mental health condition. What are the signs or symptoms? Symptoms of a drug overdose depend on the drug and the amount that was taken. Common danger signs include:  Behavior changes.  Sleepiness.  Slowed breathing.  Nausea and vomiting.  Seizures.  Changes in eye pupil size. The pupil may be very large or very small. If there are signs and symptoms of very low blood pressure (shock) from an overdose, emergency treatment is required. These include:  Cold and clammy skin.  Pale skin.  Blue lips.  Very slow breathing.  Extreme sleepiness.  Loss of consciousness. How is this diagnosed? This condition may be diagnosed based on your symptoms. It is important to tell your health care  provider:  All of the drugs that you took.  When you took the drugs.  Whether you were drinking alcohol. Your health care provider will do a physical exam. This exam may include:  Checking and monitoring your heart rate and rhythm, your temperature, and your blood pressure (vital signs).  Checking your breathing and oxygen level. You may also have tests, including:  Urine tests to check for drugs in your system.  Blood tests to check for:  Drugs in your system.  Signs of an imbalance of your blood minerals (electrolytes).  Liver damage.  Kidney damage. How is this treated? Supporting your vital signs and your breathing is the first step in treating a drug overdose. Treatment may also include:  Giving fluids and electrolytes through an IV tube.  Inserting a breathing tube (endotracheal tube) in your airway to help you breathe.  Passing a tube through your nose and into your stomach (NG tube, or nasogastric tube) to wash out your stomach.  Giving medicines that:  Make you vomit.  Absorb any medicine that is left in your digestive system.  Block or reverse the effect of the drug that caused the overdose.  Filtering your blood through an artificial kidney machine (hemodialysis). You may need this if your overdose is severe or if you have kidney failure.  Ongoing counseling and mental health support if you intentionally overdosed or used an illegal drug. Follow these instructions at home:  Take medicines only as directed by your health care provider. Always ask your  health care provider about possible side effects of any new drug that you start taking.  Keep a list of all of the drugs that you take, including over-the-counter medicines. Bring this list with you to all of your medical visits.  Drink enough fluid to keep your urine clear or pale yellow.  Keep all follow-up visits as directed by your health care provider. This is important. How is this prevented?  Get  help if you are struggling with:  Alcohol or drug use.  Depression or another mental health problem.  Keep the phone number of your local poison control center near your phone or on your cell phone.  Store all medicines in safety containers that are out of the reach of children.  Read the drug inserts that come with your medicines.  Do not use illegal drugs.  Do not drink alcohol when taking drugs.  Do not take medicines that are not prescribed for you. Contact a health care provider if:  Your symptoms return.  You develop new symptoms or side effects when you take medicines. Get help right away if:  You think that you or someone else may have taken too much of a drug. The hotline of the [facility] is 973-158-0537.  You or someone else is having symptoms of a drug overdose.  You have serious thoughts about hurting yourself or others.  You become confused.  You have:  Chest pain.  Difficulty breathing.  A loss of consciousness. Drug overdose is an emergency. Do not wait to see if the symptoms will go away. Get medical help right away. Call your local emergency services (911 in the [place] Do not drive yourself to the hospital. This information is not intended to replace advice given to you by your health care provider. Make sure you discuss any questions you have with your health care provider. Document Released: 07/15/2014 Document Revised: 07/10/2015 Document Reviewed: 03/05/2014 Elsevier Interactive Patient Education  2017 [employer].

## 2016-07-13 DIAGNOSIS — E878 Other disorders of electrolyte and fluid balance, not elsewhere classified: Secondary | ICD-10-CM

## 2016-07-13 DIAGNOSIS — T1491XA Suicide attempt, initial encounter: Secondary | ICD-10-CM

## 2016-07-13 DIAGNOSIS — F314 Bipolar disorder, current episode depressed, severe, without psychotic features: Secondary | ICD-10-CM

## 2016-07-13 DIAGNOSIS — Z818 Family history of other mental and behavioral disorders: Secondary | ICD-10-CM

## 2016-07-13 DIAGNOSIS — T421X2A Poisoning by iminostilbenes, intentional self-harm, initial encounter: Secondary | ICD-10-CM

## 2016-07-13 DIAGNOSIS — Z811 Family history of alcohol abuse and dependence: Secondary | ICD-10-CM

## 2016-07-13 MED ORDER — LURASIDONE HCL 20 MG PO TABS
20.0000 mg | ORAL_TABLET | Freq: Every day | ORAL | Status: DC
  Administered 2016-07-13 – 2016-07-14 (×2): 20 mg via ORAL
  Filled 2016-07-13 (×3): qty 1

## 2016-07-13 MED ORDER — LAMOTRIGINE 25 MG PO TABS
25.0000 mg | ORAL_TABLET | Freq: Every day | ORAL | Status: DC
  Filled 2016-07-13 (×2): qty 1

## 2016-07-13 MED ORDER — THYROID 60 MG PO TABS
90.0000 mg | ORAL_TABLET | Freq: Every day | ORAL | Status: DC
  Administered 2016-07-14 – 2016-07-29 (×16): 90 mg via ORAL
  Filled 2016-07-13 (×18): qty 1

## 2016-07-13 MED ORDER — IBUPROFEN 400 MG PO TABS
400.0000 mg | ORAL_TABLET | Freq: Four times a day (QID) | ORAL | Status: DC | PRN
  Administered 2016-07-15 – 2016-07-19 (×4): 400 mg via ORAL
  Filled 2016-07-13 (×4): qty 1

## 2016-07-13 NOTE — [facility] Counselor (Signed)
Adult Comprehensive Assessment  Patient ID: Tina [name], female   DOB: [birth year], 46 y.o.   MRN: [id number]  Information Source: Information source: Patient  Current Stressors:  Employment / Job issues: Lost job recently- was on short term disability Family Relationships: None reported Surveyor, quantity / Lack of resources (include bankruptcy): not working, no current income and has a Engineer, production / Lack of housing: People are trying to help her keep her house Physical health (include injuries & life threatening diseases): Chronic [employer] so she is tired a lot Social relationships: limited social interactions Bereavement / Loss: A neighbor died and another friend died recently.   Living/Environment/Situation:  Living Arrangements: Alone Living conditions (as described by patient or guardian): 1 year What is atmosphere in current home:  (Stressful, because I don't know how I'm going to pay for everything)  Family History:  Marital status: Divorced Divorced, when?: 3 years What types of issues is patient dealing with in the relationship?: Emotional issue for pationt but no additional details Are you sexually active?: Yes What is your sexual orientation?: More sexually promiscuous during manic phase Does patient have children?: Yes How many children?: 3 How is patient's relationship with their children?: Good relationship - has 50/50 custody  Childhood History:  By whom was/is the patient raised?: Mother Description of patient's relationship with caregiver when they were a child: Up and down Patient's description of current relationship with people who raised him/her: Up and down How were you disciplined when you got in trouble as a child/adolescent?: "I don't know." Wasn't really supervised as a teen. 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?: No Are There Guns or Other Weapons in Your Home?: No  Financial Resources:   Financial resources: No income Does patient have a Lawyer or guardian?: No  Alcohol/Substance Abuse:   What has been your use of drugs/alcohol within the last 12 months?: denies  Social Support System:   Conservation officer, nature Support System: Production assistant, radio System: Pt does not feel connected to support system Type of faith/religion: No  Leisure/Recreation:   Leisure and Hobbies: Nothing  Strengths/Needs:   What things does the patient do well?: "I don't know" In what areas does patient struggle / problems for patient: "Everything"  Discharge Plan:   Does patient have access to transportation?: Yes Will patient be returning to same living situation after discharge?: Yes Currently receiving community mental health services: Yes- [facility] If no, would patient like referral for services when discharged?: No Does patient have financial barriers related to discharge medications?: No  Summary/Recommendations:   Patient is a 46 year  old  female with a diagnosis of Bipolar Disorder. Pt presented to the hospital after an intentional overdose. Pt reports primary trigger(s) for admission including unmanaged depression and need for medication adjustments. Patient will benefit from crisis stabilization, medication evaluation, group therapy and psycho education in addition to case management for discharge planning. At discharge it is recommended that Pt remain compliant with established discharge plan and continued treatment.   Tina Shanks, LCSW Clinical Social Work [phone]

## 2016-07-13 NOTE — [facility] Suicide Risk Assessment (Signed)
[facility] INPATIENT:  Family/Significant Other Suicide Prevention Education  Suicide Prevention Education:  Education Completed; Genelle Gather, Pt's mother [phone], has been identified by the patient as the family member/significant other with whom the patient will be residing, and identified as the person(s) who will aid the patient in the event of a mental health crisis (suicidal ideations/suicide attempt).  With written consent from the patient, the family member/significant other has been provided the following suicide prevention education, prior to the and/or following the discharge of the patient.  The suicide prevention education provided includes the following:  Suicide risk factors  Suicide prevention and interventions  National Suicide Hotline telephone number  [facility] assessment telephone number  [facility] Emergency Assistance 911  [facility] and/or Residential Mobile Crisis Unit telephone number  Request made of family/significant other to:  Remove weapons (e.g., guns, rifles, knives), all items previously/currently identified as safety concern.    Remove drugs/medications (over-the-counter, prescriptions, illicit drugs), all items previously/currently identified as a safety concern.  The family member/significant other verbalizes understanding of the suicide prevention education information provided.  The family member/significant other agrees to remove the items of safety concern listed above.  Verdene Lennert 07/13/2016, 2:30 PM

## 2016-07-13 NOTE — [facility] Group Notes (Signed)
[facility] LCSW Group Therapy 07/13/2016 1:15 PM  Type of Therapy: Group Therapy- Emotion Regulation  Participation Level: Active   Participation Quality:  Appropriate  Affect: Tearful  Cognitive: Alert and Oriented   Insight:  Developing/Improving  Engagement in Therapy: Developing/Improving and Engaged   Modes of Intervention: Clarification, Confrontation, Discussion, Education, Exploration, Limit-setting, Orientation, Problem-solving, Rapport Building, Dance movement psychotherapist, Socialization and Support  Summary of Progress/Problems: The topic for group today was emotional regulation. This group focused on both positive and negative emotion identification and allowed group members to process ways to identify feelings, regulate negative emotions, and find healthy ways to manage internal/external emotions. Group members were asked to reflect on a time when their reaction to an emotion led to a negative outcome and explored how alternative responses using emotion regulation would have benefited them. Group members were also asked to discuss a time when emotion regulation was utilized when a negative emotion was experienced. Pt states that she has a difficult time regulating her suicidal thoughts. Pt reports that she had a suicide attempt before coming into the hospital and she still wishes that she would've died. Pt states that her depression stems a lot from not being able to find employment. However, pt went on to say that she's missed several job interviews due to not feeling up to going. Pt was tearful during several moments of the group and reports having extreme hopelessness.    Jonathon Jordan, MSW, LCSWA 07/13/2016 3:59 PM

## 2016-07-13 NOTE — [facility] Suicide Risk Assessment (Signed)
[facility] Admission Suicide Risk Assessment   Nursing information obtained from:  Patient Demographic factors:  Divorced or widowed, Living alone Current Mental Status:  Suicidal ideation indicated by patient Loss Factors:  Decrease in vocational status, Loss of significant relationship, Financial problems / change in socioeconomic status Historical Factors:  Impulsivity, Prior suicide attempts Risk Reduction Factors:  Responsible for children under 2 years of age  Total Time spent with patient: 45 minutes Principal Problem:  Bipolar Disorder, Depressed  Diagnosis:   Patient Active Problem List   Diagnosis Date Noted  . Tegretol toxicity [T42.1X1A] 07/09/2016  . Electrolyte imbalance [E87.8] 07/09/2016  . Bipolar I disorder, most recent episode depressed (HCC) [F31.30] 06/19/2016  . MDD (major depressive disorder) [F32.9] 06/18/2016  . Bipolar 1 disorder, manic, full remission (HCC) [F31.74] 06/18/2016  . Migraine [G43.909] 02/20/2015  . Affective psychosis, bipolar (HCC) [F31.9]   . Bipolar disorder, current episode depressed, severe, without psychotic features (HCC) [F31.4]   . Bipolar affective disorder, current episode severe (HCC) [F31.9] 12/02/2014  . Bipolar I disorder, current or most recent episode depressed, in partial remission (HCC) [F31.75] 07/11/2014  . Bipolar 1 disorder, mixed (HCC) [F31.60] 06/27/2014  . Disease of thyroid gland [E07.9] 06/27/2014  . Anxiety [F41.9] 06/27/2014  . 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 Bipolar 1 disorder, depressed, partial remission (HCC) [F31.75] 06/27/2014  . 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 Alcohol use disorder, severe, dependence (HCC) [F10.20] 06/24/2014  . Bipolar disorder, unspecified (HCC) [F31.9] 02/16/2013  . Adrenal insufficiency (HCC) [E27.40]   . Acne [L70.9]   . Hx of Lyme disease [Z86.19]   . 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 Bipolar I disorder, most recent episode (or current) manic (HCC) [F31.10] 08/29/2012  . Hypothyroidism [E03.9] 12/23/2010  . Myalgia [M79.1] 12/23/2010  . Fatigue [R53.83] 12/23/2010  . Unspecified vitamin deficiency [E56.9] 12/23/2010  . Depression [F32.9]     Continued Clinical Symptoms:  Alcohol Use Disorder Identification Test Final Score (AUDIT): 1 The "Alcohol Use Disorders Identification Test", Guidelines for Use in Primary Care, Second Edition.  World Science writer [facility]). Score between 0-7:  no or low risk or alcohol related problems. Score between 8-15:  moderate risk of alcohol related problems. Score between 16-19:  high risk of alcohol related problems. Score 20 or above:  warrants further diagnostic evaluation for alcohol dependence and treatment.   CLINICAL FACTORS:  46 year old female, history of Bipolar Disorder, status post serious suicide attempt by overdosing on Tegretol.    Psychiatric Specialty Exam: Physical Exam  ROS  Blood pressure 104/79, pulse 81, temperature 97.8 F (36.6 C), temperature source Oral, resp. rate 20, height 5' (1.524 m), weight 53.1 kg (117 lb).Body mass index is 22.85 kg/m.   see admit note MSE     COGNITIVE FEATURES THAT CONTRIBUTE TO RISK:  Closed-mindedness and Loss of executive function    SUICIDE RISK:   Moderate:  Frequent suicidal ideation with limited intensity, and duration, some specificity in terms of plans, no associated intent, good self-control, limited dysphoria/symptomatology, some risk factors present, and identifiable protective factors, including available and accessible social support.  PLAN OF CARE: Patient will be admitted to inpatient psychiatric unit for stabilization and safety. Will provide and encourage milieu participation. Provide medication management and maked adjustments as needed.  Will follow daily.    I certify that inpatient services furnished can reasonably be expected to  improve the patient's condition.  Craige Cotta, MD 07/13/2016, 6:04 PM

## 2016-07-13 NOTE — H&P (Addendum)
Psychiatric Admission Assessment Adult  Patient Identification: Tina [name] MRN:  [id number] Date of Evaluation:  07/13/2016 Chief Complaint:  BIPOLAR 1 DISORDER, MOST RECENT DEPRESSED Principal Diagnosis: Bipolar Disorder, Depressed, Suicide Attempt Diagnosis:   Patient Active Problem List   Diagnosis Date Noted  . Tegretol toxicity [T42.1X1A] 07/09/2016  . Electrolyte imbalance [E87.8] 07/09/2016  . Bipolar I disorder, most recent episode depressed ([place]) [F31.30] 06/19/2016  . MDD (major depressive disorder) [F32.9] 06/18/2016  . Bipolar 1 disorder, manic, full remission ([place]) [F31.74] 06/18/2016  . Migraine [G43.909] 02/20/2015  . Affective psychosis, bipolar ([place]) [F31.9]   . Bipolar disorder, current episode depressed, severe, without psychotic features ([place]) [F31.4]   . Bipolar affective disorder, current episode severe ([place]) [F31.9] 12/02/2014  . Bipolar I disorder, current or most recent episode depressed, in partial remission ([place]) [F31.75] 07/11/2014  . Bipolar 1 disorder, mixed ([place]) [F31.60] 06/27/2014  . Disease of thyroid gland [E07.9] 06/27/2014  . Anxiety [F41.9] 06/27/2014  . 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 Cannabis dependence without physiological dependence ([place]) [F12.20] 06/27/2014  . Bipolar 1 disorder, depressed, partial remission ([place]) [F31.75] 06/27/2014  . 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 Alcohol use disorder, severe, dependence ([place]) [F10.20] 06/24/2014  . Bipolar disorder, unspecified ([place]) [F31.9] 02/16/2013  . Adrenal insufficiency ([place]) [E27.40]   . Acne [L70.9]   . Hx of Lyme disease [Z86.19]   . 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 Bipolar I disorder, most recent episode (or current) manic ([place]) [F31.10] 08/29/2012  . Hypothyroidism [E03.9] 12/23/2010  . Myalgia [M79.1] 12/23/2010  . Fatigue [R53.83] 12/23/2010  . Unspecified vitamin deficiency [E56.9] 12/23/2010  . Depression [F32.9]    History of Present Illness:   46 year old female, known to our unit from prior admissions, has history of Bipolar Disorder. She had recently been admitted to [facility] from 4/7-4/15 for depression and suicidal ideations. She was diagnosed with Bipolar Disorder, Depressed, and was discharged with improved mood and affect on Lamictal and Effexor XR .She attempted suicide on 4/27 by overdosing on Tegretol , which she was not currently prescribed but  had left from a prior medication trial . Patient states she remembers feeling more depressed, overwhelmed, having suicidal ideations, but her recollection of overdose is limited/ fragmented . She was found confused and barely responsive by family members, and was brought to ED, requiring medical admission to stabilize . Patient reports she continues to feel depressed, overwhelmed, particularly about financial difficulties, but at this time denies any active suicidal ideations, and is able to identify her children as a protective factor against suicide . Denies hallucinations . Associated Signs/Symptoms: Depression Symptoms:  depressed mood, feelings of worthlessness/guilt, suicidal attempt, anxiety, loss of energy/fatigue, (Hypo) Manic Symptoms:  none at this time  Anxiety Symptoms:  anxious ruminations, mainly about financial difficulties  Psychotic Symptoms:  denies  PTSD Symptoms: does not endorse  Total Time spent with patient: 45 minutes  Past Psychiatric History: reports history of bipolar disorder. History of prior psychiatric admissions, most recently earlier in April, 2018, as above. History of prior suicidal ideations and attempts, most recently in 2016, by overdosing . No history of self cutting, no history of psychosis. States that she feels Lamictal has helped the best for her.   Is the patient at risk to self? Yes.    Has the patient been a risk to self in the past 6 months? Yes.    Has the patient been a risk to self within the distant past? Yes.  Is the patient a risk  to others? No.  Has the patient been a risk to others in the past 6 months? No.  Has the patient been a risk to others within the distant past? No.   Prior Inpatient Therapy:  as above Prior Outpatient Therapy:  [facility]   Alcohol Screening: 1. How often do you have a drink containing alcohol?: Monthly or less 2. How many drinks containing alcohol do you have on a typical day when you are drinking?: 1 or 2 3. How often do you have six or more drinks on one occasion?: Never Preliminary Score: 0 9. Have you or someone else been injured as a result of your drinking?: No 10. Has a relative or friend or a doctor or another health worker been concerned about your drinking or suggested you cut down?: No Alcohol Use Disorder Identification Test Final Score (AUDIT): 1 Brief Intervention: AUDIT score less than 7 or less-screening does not suggest unhealthy drinking-brief intervention not indicated Substance Abuse History in the last 12 months:  Denies drug or alcohol abuse  Consequences of Substance Abuse: Denies  Previous Psychotropic Medications: Most recently was on Lamictal, Effexor XR . States Lamictal has worked in the past, was recently restarted but has not yet titrated to prior doses . States Martha Clan was helpful and felt better, but also gained 50 lbs and expresses reluctance to try this medication or any other medication often associated with weight gain. Psychological Evaluations: No Past Medical History: History of low blood pressure, states she has been told she has adrenal insufficiency. Had an appointment to see an endocrinologist for this issue this or next week.  Past Medical History:  Diagnosis Date  . Acne   . Adrenal insufficiency ([place])   . Bipolar 2 disorder ([place])   . Cellulitis   . Depression   . Genital warts   . Headache   . History of chicken pox   . Hx of Lyme disease   . Hypothyroidism     Past Surgical History:  Procedure Laterality Date  . CESAREAN SECTION   2003, 2007, 2011  . INGUINAL HERNIA REPAIR Bilateral   . TONSILLECTOMY    . UMBILICAL HERNIA REPAIR     Family History: mother supportive , reports history of depression and alcohol abuse in extended family  Family History  Problem Relation Age of Onset  . Other Mother     PAD  . Heart disease Other     Grandparent  . Alcohol abuse Paternal Grandmother   . Mental retardation Paternal Grandmother   . Arthritis Maternal Grandfather   . Heart disease Maternal Grandfather    Family Psychiatric  History: as above  Tobacco Screening: Have you used any form of tobacco in the last 30 days? (Cigarettes, Smokeless Tobacco, Cigars, and/or Pipes): Yes Tobacco use, Select all that apply: 5 or more cigarettes per day Are you interested in Tobacco Cessation Medications?: No, patient refused Counseled patient on smoking cessation including recognizing danger situations, developing coping skills and basic information about quitting provided: Refused/Declined practical counseling Social History: divorced, has three children, shares custody with ex husband , denies legal issues, no SO at this time, her major stressor is financial at this time, states she is facing a lot of debt. History  Alcohol Use  . Yes    Comment: social drinker-few drinks a week      History  Drug Use No    Additional Social History:  Allergies:   Allergies  Allergen Reactions  . Stadol [Butorphanol] Other (See Comments)    Dependence  . Vancomycin Itching  . Penicillins Hives and Rash  . Sulfa Antibiotics Rash   Lab Results:  Results for orders placed or performed during the hospital encounter of 07/08/16 (from the past 48 hour(s))  Magnesium     Status: Abnormal   Collection Time: 07/12/16  3:36 AM  Result Value Ref Range   Magnesium 1.6 (L) 1.7 - 2.4 mg/dL  Phosphorus     Status: None   Collection Time: 07/12/16  3:36 AM  Result Value Ref Range   Phosphorus 3.7 2.5 - 4.6 mg/dL  CBC with Differential/Platelet      Status: Abnormal   Collection Time: 07/12/16  3:36 AM  Result Value Ref Range   WBC 4.3 4.0 - 10.5 K/uL   RBC 3.35 (L) 3.87 - 5.11 MIL/uL   Hemoglobin 11.0 (L) 12.0 - 15.0 g/dL   HCT 31.4 (L) 36.0 - 46.0 %   MCV 93.7 78.0 - 100.0 fL   MCH 32.8 26.0 - 34.0 pg   MCHC 35.0 30.0 - 36.0 g/dL   RDW 11.9 11.5 - 15.5 %   Platelets 150 150 - 400 K/uL   Neutrophils Relative % 49 %   Neutro Abs 2.1 1.7 - 7.7 K/uL   Lymphocytes Relative 42 %   Lymphs Abs 1.8 0.7 - 4.0 K/uL   Monocytes Relative 8 %   Monocytes Absolute 0.3 0.1 - 1.0 K/uL   Eosinophils Relative 1 %   Eosinophils Absolute 0.1 0.0 - 0.7 K/uL   Basophils Relative 0 %   Basophils Absolute 0.0 0.0 - 0.1 K/uL  Basic metabolic panel     Status: Abnormal   Collection Time: 07/12/16  3:36 AM  Result Value Ref Range   Sodium 138 135 - 145 mmol/L   Potassium 3.5 3.5 - 5.1 mmol/L   Chloride 109 101 - 111 mmol/L   CO2 25 22 - 32 mmol/L   Glucose, Bld 99 65 - 99 mg/dL   BUN <5 (L) 6 - 20 mg/dL   Creatinine, Ser 0.45 0.44 - 1.00 mg/dL   Calcium 8.2 (L) 8.9 - 10.3 mg/dL   GFR calc non Af Amer >60 >60 mL/min   GFR calc Af Amer >60 >60 mL/min    Comment: (NOTE) The eGFR has been calculated using the CKD EPI equation. This calculation has not been validated in all clinical situations. eGFR's persistently <60 mL/min signify possible Chronic Kidney Disease.    Anion gap 4 (L) 5 - 15  CK     Status: Abnormal   Collection Time: 07/12/16  3:36 AM  Result Value Ref Range   Total CK 301 (H) 38 - 234 U/L    Blood Alcohol level:  Lab Results  Component Value Date   ETH <5 07/08/2016   ETH <5 27/05/5007    Metabolic Disorder Labs:  Lab Results  Component Value Date   HGBA1C 5.1 12/03/2014   MPG 100 12/03/2014   MPG 100 12/02/2014   No results found for: PROLACTIN Lab Results  Component Value Date   CHOL 195 12/03/2014   TRIG 122 12/03/2014   HDL 42 12/03/2014   CHOLHDL 4.6 12/03/2014   VLDL 24 12/03/2014   LDLCALC 129  (H) 12/03/2014    Current Medications: Current Facility-Administered Medications  Medication Dose Route Frequency Provider Last Rate Last Dose  . acetaminophen (TYLENOL) tablet 650 mg  650 mg Oral Q6H PRN Kerrie Buffalo, NP      .  alum & mag hydroxide-simeth (MAALOX/MYLANTA) 200-200-20 MG/5ML suspension 30 mL  30 mL Oral Q4H PRN Kerrie Buffalo, NP      . hydrOXYzine (ATARAX/VISTARIL) tablet 25 mg  25 mg Oral TID PRN Kerrie Buffalo, NP      . ibuprofen (ADVIL,MOTRIN) tablet 400 mg  400 mg Oral Q6H PRN Kerrie Buffalo, NP      . magnesium hydroxide (MILK OF MAGNESIA) suspension 30 mL  30 mL Oral Daily PRN Kerrie Buffalo, NP      . nicotine (NICODERM CQ - dosed in mg/24 hours) patch 21 mg  21 mg Transdermal Daily Kerrie Buffalo, NP      . traZODone (DESYREL) tablet 50 mg  50 mg Oral QHS PRN Kerrie Buffalo, NP       PTA Medications: Prescriptions Prior to Admission  Medication Sig Dispense Refill Last Dose  . lamoTRIgine (LAMICTAL) 25 MG tablet Take 1 tablet (25 mg total) by mouth 2 (two) times daily. 30 tablet 0   . mupirocin cream (BACTROBAN) 2 % Apply topically 2 (two) times daily. 15 g 0   . thyroid (ARMOUR) 90 MG tablet Take 1 tablet (90 mg total) by mouth daily before breakfast. 30 tablet 0 unk  . venlafaxine XR (EFFEXOR-XR) 75 MG 24 hr capsule Take 1 capsule (75 mg total) by mouth daily with breakfast. 30 capsule 0 unk    Musculoskeletal: Strength & Muscle Tone: within normal limits Gait & Station: normal Patient leans: N/A  Psychiatric Specialty Exam: Physical Exam  Review of Systems  Constitutional: Negative.  Negative for chills and fever.  HENT: Negative.   Eyes: Negative.   Respiratory: Negative.   Cardiovascular: Negative.   Gastrointestinal: Negative.   Genitourinary: Negative.   Musculoskeletal: Negative.   Skin:       Has a painless blister on ankle, states it has been present for days, not worsening, occurred prior to carbamazepine overdose. No other rash, no  mucosal involvement   Neurological: Negative.   Endo/Heme/Allergies: Negative.   Psychiatric/Behavioral: Positive for depression and suicidal ideas.  All other systems reviewed and are negative.   Blood pressure 104/79, pulse 81, temperature 97.8 F (36.6 C), temperature source Oral, resp. rate 20, height 5' (1.524 m), weight 53.1 kg (117 lb).Body mass index is 22.85 kg/m.  General Appearance: Fairly Groomed  Eye Contact:  Good  Speech:  Normal Rate  Volume:  Normal  Mood:  depressed  Affect:  constricted, tearful at times during session  Thought Process:  Linear and Descriptions of Associations: Intact  Orientation:  Full (Time, Place, and Person)  Thought Content:  no hallucinations, no delusions  Suicidal Thoughts:  No some residual passive SI, but denies plan or intention to hurt self or of suicide, and contracts for safety on unit   Homicidal Thoughts:  No  Memory:  recent and remote grossly intact   Judgement:  Fair  Insight:  Fair  Psychomotor Activity:  Normal  Concentration:  Concentration: Good and Attention Span: Good  Recall:  Good  Fund of Knowledge:  Good  Language:  Good  Akathisia:  Negative  Handed:  Right  AIMS (if indicated):     Assets:  Communication Skills Desire for Improvement Resilience  ADL's:  Intact  Cognition:  WNL  Sleep:  Number of Hours: 6.75    Treatment Plan Summary: Daily contact with patient to assess and evaluate symptoms and progress in treatment, Medication management, Plan inpatient admission and medications as below  Observation Level/Precautions:  15 minute checks  Laboratory:  as needed   Psychotherapy: milieu, group therapy    Medications:  Patient states lamictal has been the most effective medication she has been on , and that her mood deteriorated after stopping this medication. Denies history of side effects. START LAMICTAL 25 mgrs QDAY. Patient agrees to mood stabilizer. States she does not remember Lithium as helpful,  did not tolerate Depakote well , and does not want to take any medication often associated with weight gain. Agrees to [place]. START LATUDA 20 mgrs QDAY- titrated as tolerated RESTART ARMOUR THYROID 90 mgr QAM  Consultations: as needed    Discharge Concerns:  -   Estimated LOS: 6 days   Other:     Physician Treatment Plan for Primary Diagnosis:  Bipolar Disorder Depressed  Long Term Goal(s): Improvement in symptoms so as ready for discharge  Short Term Goals: Ability to maintain clinical measurements within normal limits will improve and Compliance with prescribed medications will improve  Physician Treatment Plan for Secondary Diagnosis: Suicide Attempts  Long Term Goal(s): Improvement in symptoms so as ready for discharge  Short Term Goals: Ability to identify changes in lifestyle to reduce recurrence of condition will improve, Ability to verbalize feelings will improve, Ability to disclose and discuss suicidal ideas, Ability to demonstrate self-control will improve and Ability to identify and develop effective coping behaviors will improve  I certify that inpatient services furnished can reasonably be expected to improve the patient's condition.    Jenne Campus, MD 5/2/20185:38 PM

## 2016-07-13 NOTE — Progress Notes (Signed)
Pt has spent most of the evening in the dayroom sitting in a corner to herself, watching TV.  She reports that she had an "ok" day.  She states she still has passive thoughts of suicide, but can contract for safety at this time.  She denies HI/AVH.  She states she has gone to some groups today.  She is hoping to get the right combo of meds so that she does not need to come back to the hospital.  Her main concern tonight is two quarter size rash areas on each of her feet.  She said she is they just appeared and does not know the origin.  She is not complaining of any itching or discomfort.  She is concerned about what caused the rash.  Bactroban and tegaderm dressing were placed at the ED.  Pt states the doctor looked at it today, but made no changes or treatment.  Tonight the dressing was removed and area was cleaned.  It was left to air and pt was given bandaids to apply if the areas were irritated by the bed linens.  Writer will inform morning shift of pt's concerns.  Support and encouragement offered.  Discharge plans are in process.  Safety maintained with q15 minute checks.

## 2016-07-13 NOTE — Plan of Care (Signed)
Problem: Self-Concept: Goal: Ability to disclose and discuss suicidal ideas will improve Outcome: Progressing Pt verbalized to writer intermittent SI. Pt denies active SI during shift assessment. Pt verbally contracts for safety.

## 2016-07-13 NOTE — Progress Notes (Signed)
Recreation Therapy Notes  Date: 07/13/16 Time: 0930 Location: [address] Dayroom  Group Topic: Stress Management  Goal Area(s) Addresses:  Patient will verbalize importance of using healthy stress management.  Patient will identify positive emotions associated with healthy stress management.   Intervention: Stress Management  Activity :  Guided Imagery.  LRT introduced the stress management technique of guided imagery.  LRT read a script to allow patients engage in the technique.  Patients were to follow along as the script was read by LRT to participate in the technique of guided imagery.  Education:  Stress Management, Discharge Planning.   Education Outcome: Acknowledges edcuation/In group clarification offered/Needs additional education  Clinical Observations/Feedback: Pt did not attend group.    Caroll Rancher, LRT/CTRS         Caroll Rancher A 07/13/2016 11:25 AM

## 2016-07-13 NOTE — Progress Notes (Signed)
D: Pt presents with flat affect and depressed mood. Pt reported feeling foggy this morning and unable to think clearly because she was just in the ICU. Pt reports passive suicidal thoughts. No active suicidal thoughts during shift assessment verbalized. Pt verbally contracts for safety. Pt denies SI plan or intent. Pt rates depression 9/10. Pt denies anxiety. Pt c/o of swelling to her left forearm from prior IV site. Pt given ice packs for swelling and an order obtained for Motrin 400 mg every 6 hours for pain from Sheila May, NP. A: Orders reviewed with pt.  Medications administered as ordered per MD. Verbal support provided. Pt encouraged to attend groups. 15 minute checks performed for safety.  R: Pt receptive to tx.

## 2016-07-14 DIAGNOSIS — R21 Rash and other nonspecific skin eruption: Secondary | ICD-10-CM | POA: Diagnosis present

## 2016-07-14 MED ORDER — HYDROCORTISONE 1 % EX CREA
TOPICAL_CREAM | Freq: Three times a day (TID) | CUTANEOUS | Status: DC
  Administered 2016-07-14 – 2016-07-16 (×5): via TOPICAL
  Filled 2016-07-14 (×2): qty 28

## 2016-07-14 MED ORDER — BUPROPION HCL ER (XL) 150 MG PO TB24
150.0000 mg | ORAL_TABLET | Freq: Every day | ORAL | Status: DC
  Administered 2016-07-15 – 2016-07-19 (×5): 150 mg via ORAL
  Filled 2016-07-14 (×8): qty 1

## 2016-07-14 MED ORDER — LURASIDONE HCL 20 MG PO TABS
20.0000 mg | ORAL_TABLET | Freq: Two times a day (BID) | ORAL | Status: DC
  Filled 2016-07-14 (×4): qty 1

## 2016-07-14 MED ORDER — ENSURE ENLIVE PO LIQD
237.0000 mL | Freq: Two times a day (BID) | ORAL | Status: DC
  Administered 2016-07-15 – 2016-07-29 (×11): 237 mL via ORAL

## 2016-07-14 MED ORDER — LURASIDONE HCL 20 MG PO TABS
20.0000 mg | ORAL_TABLET | Freq: Every day | ORAL | Status: DC
  Administered 2016-07-15: 20 mg via ORAL
  Filled 2016-07-14 (×4): qty 1

## 2016-07-14 NOTE — Progress Notes (Signed)
Adult Psychoeducational Group Note  Date:  07/14/2016 Time:  4:22 AM  Group Topic/Focus:  Wrap-Up Group:   The focus of this group is to help patients review their daily goal of treatment and discuss progress on daily workbooks.  Participation Level:  Active  Participation Quality:  Appropriate, Attentive and Sharing  Affect:  Appropriate  Cognitive:  Alert and Appropriate  Insight: Appropriate and Good  Engagement in Group:  Engaged  Modes of Intervention:  Discussion  Additional Comments:  Patient stated her day started off low but improve as the day went on. Patient stated her goal for the day was to socialize more with patients and staff. Patient stated she achieved her goal today. Patient rated her day a 5 out of 10. Patient stated her goal for tomorrow was to socialize more with staff and patients.  Felipa FurnaceChristopher  Rogen Porte 07/14/2016, 4:22 AM

## 2016-07-14 NOTE — [facility] Group Notes (Signed)
[facility] LCSW Group Therapy 07/14/2016 1:15 PM  Type of Therapy: Group Therapy- Feelings about Diagnosis  Participation Level: Pt invited. Did not attend.  Pasha Broad B Yamilette Garretson, MSW, LCSWA [phone]      

## 2016-07-14 NOTE — Progress Notes (Signed)
[facility] MD Progress Note  07/14/2016 4:28 PM Tina [name]  MRN:  [id number] Subjective:  Patient reports ongoing depression, sadness, and feelings of anxiety. At this time denies suicidal ideations. Denies medication side effects. Objective : I have discussed case with treatment team and have met with patient . She is presenting with slightly improved range of affect, smiles briefly at times, but overall reports ongoing significant depression . Denies SI at this time. Denies medication side effects. Of note, she has stated that Lamictal has been an effective medication for her. However, patient has noted a blistering skin lesion on her ankle. ( has been present for several days, patient feels it started BEFORE her Carbamazepine overdose) . She states that she has also noted another distinct macular area on her other foot. There is no other rash noted, no mucosal involvement , and no systemic symptoms , no fever. At this time she is more visible on unit, starting to come out of her room more, visible in day room, going to some groups. Patient feels that Effexor XR worked at first, but seemed to have stopped working . We discussed other options, she remembers she did "OK" with Wellbutrin in the past . Does not remember having had side effects. Denies history of seizure disorder or of eating disorder Principal Problem: Bipolar Disorder, Depressed  Diagnosis:   Patient Active Problem List   Diagnosis Date Noted  . Tegretol toxicity [T42.1X1A] 07/09/2016  . Electrolyte imbalance [E87.8] 07/09/2016  . Bipolar I disorder, most recent episode depressed ([place]) [F31.30] 06/19/2016  . MDD (major depressive disorder) [F32.9] 06/18/2016  . Bipolar 1 disorder, manic, full remission ([place]) [F31.74] 06/18/2016  . Migraine [G43.909] 02/20/2015  . Affective psychosis, bipolar ([place]) [F31.9]   . Bipolar disorder, current episode depressed, severe, without psychotic features ([place]) [F31.4]   . Bipolar affective  disorder, current episode severe ([place]) [F31.9] 12/02/2014  . Bipolar I disorder, current or most recent episode depressed, in partial remission ([place]) [F31.75] 07/11/2014  . Bipolar 1 disorder, mixed ([place]) [F31.60] 06/27/2014  . Disease of thyroid gland [E07.9] 06/27/2014  . Anxiety [F41.9] 06/27/2014  . 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 Cannabis dependence without physiological dependence ([place]) [F12.20] 06/27/2014  . Bipolar 1 disorder, depressed, partial remission ([place]) [F31.75] 06/27/2014  . 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 Alcohol use disorder, severe, dependence ([place]) [F10.20] 06/24/2014  . Bipolar disorder, unspecified ([place]) [F31.9] 02/16/2013  . Adrenal insufficiency ([place]) [E27.40]   . Acne [L70.9]   . Hx of Lyme disease [Z86.19]   . 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 Bipolar I disorder, most recent episode (or current) manic ([place]) [F31.10] 08/29/2012  . Hypothyroidism [E03.9] 12/23/2010  . Myalgia [M79.1] 12/23/2010  . Fatigue [R53.83] 12/23/2010  . Unspecified vitamin deficiency [E56.9] 12/23/2010  . Depression [F32.9]    Total Time spent with patient: 20 minutes  Past Medical History:  Past Medical History:  Diagnosis Date  . Acne   . Adrenal insufficiency ([place])   . Bipolar 2 disorder ([place])   . Cellulitis   . Depression   . Genital warts   . Headache   . History of chicken pox   . Hx of Lyme disease   . Hypothyroidism     Past Surgical History:  Procedure Laterality Date  . CESAREAN SECTION  2003, 2007, 2011  . INGUINAL HERNIA REPAIR Bilateral   . TONSILLECTOMY    . UMBILICAL HERNIA REPAIR     Family History:  Family History  Problem  Relation Age of Onset  . Other Mother     PAD  . Heart disease Other     Grandparent  . Alcohol abuse Paternal Grandmother   . Mental retardation Paternal Grandmother   . Arthritis Maternal Grandfather   . Heart disease Maternal Grandfather    Social History:  History  Alcohol Use  . Yes     Comment: social drinker-few drinks a week      History  Drug Use No    Social History   Social History  . Marital status: Single    Spouse name: N/A  . Number of children: N/A  . Years of education: 61   Occupational History  . HOMEMAKER    Social History Main Topics  . Smoking status: Current Every Day Smoker    Packs/day: 1.50    Years: 29.00    Types: Cigarettes  . Smokeless tobacco: Never Used  . Alcohol use Yes     Comment: social drinker-few drinks a week   . Drug use: No  . Sexual activity: Not Currently   Other Topics Concern  . None   Social History Narrative   Regular exercise-no   Additional Social History:   Sleep: improving   Appetite:  Fair  Current Medications: Current Facility-Administered Medications  Medication Dose Route Frequency Provider Last Rate Last Dose  . acetaminophen (TYLENOL) tablet 650 mg  650 mg Oral Q6H PRN Kerrie Buffalo, NP   650 mg at 07/14/16 0856  . alum & mag hydroxide-simeth (MAALOX/MYLANTA) 200-200-20 MG/5ML suspension 30 mL  30 mL Oral Q4H PRN Kerrie Buffalo, NP      . hydrOXYzine (ATARAX/VISTARIL) tablet 25 mg  25 mg Oral TID PRN Kerrie Buffalo, NP   25 mg at 07/13/16 1818  . ibuprofen (ADVIL,MOTRIN) tablet 400 mg  400 mg Oral Q6H PRN Kerrie Buffalo, NP      . lurasidone (LATUDA) tablet 20 mg  20 mg Oral BID Jenne Campus, MD      . magnesium hydroxide (MILK OF MAGNESIA) suspension 30 mL  30 mL Oral Daily PRN Kerrie Buffalo, NP      . nicotine (NICODERM CQ - dosed in mg/24 hours) patch 21 mg  21 mg Transdermal Daily Kerrie Buffalo, NP      . thyroid (ARMOUR) tablet 90 mg  90 mg Oral QAC breakfast Jenne Campus, MD   90 mg at 07/14/16 0848  . traZODone (DESYREL) tablet 50 mg  50 mg Oral QHS PRN Kerrie Buffalo, NP        Lab Results: No results found for this or any previous visit (from the past 48 hour(s)).  Blood Alcohol level:  Lab Results  Component Value Date   ETH <5 07/08/2016   ETH <5 61/68/3729     Metabolic Disorder Labs: Lab Results  Component Value Date   HGBA1C 5.1 12/03/2014   MPG 100 12/03/2014   MPG 100 12/02/2014   No results found for: PROLACTIN Lab Results  Component Value Date   CHOL 195 12/03/2014   TRIG 122 12/03/2014   HDL 42 12/03/2014   CHOLHDL 4.6 12/03/2014   VLDL 24 12/03/2014   LDLCALC 129 (H) 12/03/2014    Physical Findings: AIMS: Facial and Oral Movements Muscles of Facial Expression: None, normal Lips and Perioral Area: None, normal Jaw: None, normal Tongue: None, normal,Extremity Movements Upper (arms, wrists, hands, fingers): None, normal Lower (legs, knees, ankles, toes): None, normal, Trunk Movements Neck, shoulders, hips: None, normal, Overall Severity Severity of  abnormal movements (highest score from questions above): None, normal Incapacitation due to abnormal movements: None, normal Patient's awareness of abnormal movements (rate only patient's report): No Awareness, Dental Status Current problems with teeth and/or dentures?: No Does patient usually wear dentures?: No  CIWA:  CIWA-Ar Total: 1 COWS:  COWS Total Score: 2  Musculoskeletal: Strength & Muscle Tone: within normal limits Gait & Station: normal Patient leans: N/A  Psychiatric Specialty Exam: Physical Exam  ROS no fever, no chills, no vomiting   Blood pressure 102/70, pulse 92, temperature 98.7 F (37.1 C), temperature source Oral, resp. rate 16, height 5' (1.524 m), weight 53.1 kg (117 lb).Body mass index is 22.85 kg/m.  General Appearance: Fairly Groomed  Eye Contact:  Good  Speech:  Normal Rate  Volume:  Decreased  Mood:  Depressed  Affect:  constricted, sad, does smile briefly at times   Thought Process:  Linear and Descriptions of Associations: Intact  Orientation:  Full (Time, Place, and Person)  Thought Content:  no hallucinations, no delusions, not internally preoccupied   Suicidal Thoughts:  No denies any active suicidal ideations , denies any self  injurious ideations, contracts for safety on unit   Homicidal Thoughts:  No  Memory:  recent and remote grossly intact  Judgement:  Fair  Insight:  Fair  Psychomotor Activity:  Decreased  Concentration:  Concentration: Good and Attention Span: Good  Recall:  Good  Fund of Knowledge:  Good  Language:  Good  Akathisia:  Negative  Handed:  Right  AIMS (if indicated):     Assets:  Communication Skills Desire for Improvement Resilience  ADL's:  Intact  Cognition:  WNL  Sleep:  Number of Hours: 6.75   Assessment - patient remains depressed, constricted in affect, but today smiling briefly at times. Denies any current suicidal ideations. She presents with two localized skin lesions on both extremities, one of which is blistering , and which she states has been present for a few days. There is no mucosal involvement or any  systemic symptoms at this time. Patient reports history of good response and tolerance to Wellbutrin in the past  Treatment Plan Summary: Daily contact with patient to assess and evaluate symptoms and progress in treatment, Medication management, Plan inpatient treatment  and medications as below Encourage ongoing group and milieu participation to work on coping skills and symptom reduction  Continue  Latuda 20 mgrs QDAY  for mood disorder- patient prefers to titrate gradually to minimize side effects Continue Vistaril 25 mgrs Q 8 hours PRN for anxiety as needed  Continue Trazodone 50 mgrs QHS PRN for insomnia as needed  Continue Armour Thyroid for Hypothyroidism Would not restart Lamictal at this time due to above described skin lesions  Start Wellbutrin XL 150 mgrs QAM for depression I have requested hospitalist consultation to review above reported skin lesions      Jenne Campus, MD 07/14/2016, 4:28 PM

## 2016-07-14 NOTE — Progress Notes (Signed)
Nursing Progress Note 7p-7a  D) Patient presents blunted and depressed but is cooperative with Clinical research associatewriter. Patient complains of pain to her L dorsal wrist from "my IV at the other hospital". Area is mildly red, no swelling. Patient is isolative to room but assertive with her needs. Patient denies SI/HI/AVH. Patient contracts for safety on the unit.   A) Emotional support given. 1:1 interaction and active listening provided. Patient medicated with orders as prescribed. Medications reviewed with patient. Patient verbalized understanding of medications without further questions.  Snacks and fluids provided. Opportunities for questions or concerns presented to patient. Patient encouraged to continue to work on treatment goals. Labs, vital signs and patient behavior monitored throughout shift. Patient safety maintained with q15 min safety checks.  R) Patient receptive to interaction with nurse. Patient remains safe on the unit at this time. Patient denies any adverse medication reactions at this time. Patient is resting in bed without complaints. Will continue to monitor.

## 2016-07-14 NOTE — Consult Note (Signed)
Medical Consultation  Tina FoleyLaurie Kristin Germer  ONG:[id number]:[id number]  DOB: [birth year]  DOA: 07/12/2016 PCP: No PCP Per Patient   Requesting physician: DR Jama Flavorsobos Date of consultation: 07/14/16 Reason for consultation: rash  Impression/Recommendations Principal Problem:   Rash and nonspecific skin eruption - agree with stopping Lamictal - Hydrocortisone cream- will follow tomorrow  Active Problems:   MDD (major depressive disorder) - per psych   [facility] will followup again tomorrow. Please contact me if I can be of assistance in the meanwhile. Thank you for this consultation.  Chief Complaint: rash  HPI:  Tina [name] is an 46 y.o. female with Bipolar disorder who was admitted to the ICU for Tegretol overdose as a suicide attempt. We are called for a rash that she is concerned about. One is on her left inner ankle which had blistered and is now open. Second is on her left foot with is itchy and the third is under her left eye, swollen and also itchy.   She states she began having acne after being placed on psych medications.  Review of Systems  Constitutional: Negative for fever, chills weight loss- she has gained some weight lately HENT: Negative for ear pain, nosebleeds, congestion, facial swelling, rhinorrhea, neck pain, neck stiffness and ear discharge.   Respiratory: Negative for cough, shortness of breath, wheezing  Cardiovascular: Negative for chest pain, palpitations and leg swelling.  Gastrointestinal: Negative for heartburn, abdominal pain, vomiting, diarrhea or consitpation Genitourinary: Negative for dysuria, urgency, frequency, hematuria Musculoskeletal: positive for left buttock pain which was worse while she was in the ICU and is improving. Neurological: Negative for dizziness, seizures, syncope, focal weakness,  numbness and headaches.  Hematological: Does not bruise/bleed easily.    Psychiatric/Behavioral: Negative for hallucinations, confusion    Past Medical History:  Diagnosis Date  . Acne   . Adrenal insufficiency ([facility])   . Bipolar 2 disorder ([facility])   . Cellulitis   . Depression   . Genital warts   . Headache   . History of chicken pox   . Hx of Lyme disease   . Hypothyroidism     Past Surgical History:  Procedure Laterality Date  . CESAREAN SECTION  2003, 2007, 2011  . INGUINAL HERNIA REPAIR Bilateral   . TONSILLECTOMY    . UMBILICAL HERNIA REPAIR      Social History:  Smoking Cigarettes 1/2 ppc.   She has never used smokeless tobacco. She reports that she drinks alcohol about once a week. She reports that she does not use drugs.  Allergies  Allergen Reactions  . Stadol [Butorphanol] Other (See Comments)    Dependence  . Vancomycin Itching  . Penicillins Hives and Rash  . Sulfa Antibiotics Rash    Family History  Problem Relation Age of Onset  . Other Mother     PAD  . Heart disease Other     Grandparent  . Alcohol abuse Paternal Grandmother   . Mental retardation Paternal Grandmother   . Arthritis Maternal Grandfather   . Heart disease Maternal Grandfather     Prior to Admission medications   Medication Sig Start Date End Date Taking? Authorizing Provider  lamoTRIgine (LAMICTAL) 25 MG tablet Take 1 tablet (25 mg total) by mouth 2 (two) times daily. 07/12/16   Kshitiz Hanley BenAlekh, MD  mupirocin cream (BACTROBAN) 2 % Apply topically 2 (two) times daily. 07/12/16   Glade LloydKshitiz Alekh, MD  thyroid (ARMOUR) 90 MG tablet Take 1 tablet (90 mg total) by mouth daily before breakfast. 06/26/16  Oneta Rack, NP  venlafaxine XR (EFFEXOR-XR) 75 MG 24 hr capsule Take 1 capsule (75 mg total) by mouth daily with breakfast. 06/26/16   Oneta Rack, NP    Physical Exam: Blood pressure 98/68, pulse 69, temperature 98.7 F (37.1 C), temperature source Oral, resp. rate 16, height 5' (1.524 m), weight 53.1 kg (117 lb). @VITALS2 @ Filed Weights   07/12/16 1346   Weight: 53.1 kg (117 lb)   No intake or output data in the 24 hours ending 07/14/16 1733   Constitutional: Appears well-developed and well-nourished. No distress. HENT: Normocephalic. External right and left ear normal. Oropharynx is clear and moist.  Eyes: Conjunctivae and EOM are normal. PERRLA, no scleral icterus.  Neck: Normal ROM. Neck supple. No JVD. No tracheal deviation. No thyromegaly.  CVS: RRR, S1/S2 +, no murmurs, no gallops, no carotid bruit.  Pulmonary: Effort and breath sounds normal, no stridor, rhonchi, wheezes, rales.  Abdominal: Soft. BS +,  no distension, tenderness, rebound or guarding.  Musculoskeletal: Normal range of motion. No edema and no tenderness.  Neuro: Alert. Normal reflexes, muscle tone coordination. No cranial nerve deficit. Skin: Skin is warm and dry. Erythmatous circular non blanching area on left foot, ruptured blister/ denuded skin on right inner thigh about 3 cm in diameter, area of swelling and dry skin under right eye mildly erythematous. Psychiatric:   Mood appears serious,  affect is normal. Behavior, judgment, thought content normal.    Labs on Admission:  Basic Metabolic Panel:  Recent Labs Lab 07/08/16 2326 07/08/16 2358 07/09/16 0458 07/10/16 0225 07/11/16 0423 07/12/16 0336  NA 138 141 141 140  --  138  K 3.9 3.4* 4.1 3.6  --  3.5  CL 103 107 109 108  --  109  CO2 25  --  22 24  --  25  GLUCOSE 140* 114* 90 108*  --  99  BUN 10 9 8 6   --  <5*  CREATININE 0.75 0.40* 0.57 0.60  --  0.45  CALCIUM 9.4  --  8.5* 8.4*  --  8.2*  MG  --   --  1.6* 1.9 1.6* 1.6*  PHOS  --   --   --  2.8 3.5 3.7   Liver Function Tests:  Recent Labs Lab 07/08/16 2326  AST 61*  ALT 28  ALKPHOS 54  BILITOT 0.6  PROT 6.5  ALBUMIN 3.8   No results for input(s): LIPASE, AMYLASE in the last 168 hours. No results for input(s): AMMONIA in the last 168 hours. CBC:  Recent Labs Lab 07/08/16 2358 07/09/16 0458 07/12/16 0336  WBC  --  11.0* 4.3   NEUTROABS  --   --  2.1  HGB 13.3 12.5 11.0*  HCT 39.0 36.3 31.4*  MCV  --  96.3 93.7  PLT  --  172 150   Cardiac Enzymes:  Recent Labs Lab 07/10/16 0908 07/11/16 0423 07/12/16 0336  CKTOTAL 1,446* 572* 301*   BNP: Invalid input(s): POCBNP CBG:  Recent Labs Lab 07/08/16 2351  GLUCAP 106*    Radiological Exams on Admission: No results found.     Time spent: 45 min  Calvert Cantor, MD [facility] To page rounding or on call physician  [email or web address] 07/14/2016, 5:33 PM

## 2016-07-14 NOTE — Progress Notes (Signed)
D:  Patient's self inventory sheet, patient sleeps good, no sleep medication given.  Fair appetite, normal energy level, poor concentration.  Rated depression and hopeless 8, anxiety 3.  Denied withdrawals.  Denied SI.  Physical problems, rash.  Worst pain #5 in past 24 hours, L wrist,  Goal is stay social.  Plans to stay out of her room.  No discharge plans. A:  Medications administered per MD orders.  Emotional support and encouragement given patient. R:  Denied HI.  Denied A/V hallucinations.  SI off/on, contracts for safety, no plan.  Safety maintained with 15 minute checks.

## 2016-07-14 NOTE — Plan of Care (Signed)
Problem: Coping: Goal: Ability to cope will improve Outcome: Progressing Nurse discussed depression/coping skills with patient.    

## 2016-07-14 NOTE — Plan of Care (Signed)
Problem: Safety: Goal: Periods of time without injury will increase Outcome: Progressing Patient is on q15 minute safety checks and contracts for safety on the unit.   

## 2016-07-14 NOTE — Progress Notes (Signed)
Patient refused 5:00 p.m. Latuda 20 mg.  Stated feels woozy and just does not feel normal.  Feels she took too much medicine this morning.  Will discuss medications with MD tomorrow morning.   Patient stated she is willing to take medications as ordered by MD at a slower rate.

## 2016-07-14 NOTE — [facility] Group Notes (Signed)

## 2016-07-14 NOTE — Social Work (Signed)
Patient accepted Transitional Care Team services beginning at discharge.  Santa GeneraAnne Cunningham, LCSW Lead Clinical Social Worker Phone:  [phone]

## 2016-07-15 DIAGNOSIS — F3130 Bipolar disorder, current episode depressed, mild or moderate severity, unspecified: Secondary | ICD-10-CM

## 2016-07-15 DIAGNOSIS — F17210 Nicotine dependence, cigarettes, uncomplicated: Secondary | ICD-10-CM

## 2016-07-15 DIAGNOSIS — R21 Rash and other nonspecific skin eruption: Secondary | ICD-10-CM

## 2016-07-15 DIAGNOSIS — F1099 Alcohol use, unspecified with unspecified alcohol-induced disorder: Secondary | ICD-10-CM

## 2016-07-15 DIAGNOSIS — F339 Major depressive disorder, recurrent, unspecified: Secondary | ICD-10-CM

## 2016-07-15 MED ORDER — BACITRACIN-NEOMYCIN-POLYMYXIN OINTMENT TUBE
TOPICAL_OINTMENT | Freq: Three times a day (TID) | CUTANEOUS | Status: DC
  Administered 2016-07-15 – 2016-07-16 (×3): via TOPICAL
  Administered 2016-07-21: 1 via TOPICAL
  Administered 2016-07-24: 12:00:00 via TOPICAL
  Filled 2016-07-15: qty 14.17

## 2016-07-15 NOTE — Progress Notes (Signed)
Patient ID: Tina [name], female   DOB: [birth year], 46 y.o.   MRN: [id number] D: Client visible on the unit, reports "feeling woozy on that Latuda all day" "It hasn't been a good day, crying all day" A: Writer provided emotional support, encouraged client to sit or lie down, fluids offered. Staff will monitor q6215min for safety. R: Client is safe on the unit, attended group.

## 2016-07-15 NOTE — [facility] Group Notes (Signed)
[facility] LCSW Group Therapy 07/15/2016 1:15pm  Type of Therapy: Group Therapy- Feelings Around Relapse and Recovery  Participation Level: Active   Participation Quality:  Appropriate  Affect:  Appropriate  Cognitive: Alert and Oriented   Insight:  Developing   Engagement in Therapy: Developing/Improving and Engaged   Modes of Intervention: Clarification, Confrontation, Discussion, Education, Exploration, Limit-setting, Orientation, Problem-solving, Rapport Building, Dance movement psychotherapisteality Testing, Socialization and Support  Summary of Progress/Problems: The topic for today was feelings about relapse. The group discussed what relapse prevention is to them and identified triggers that they are on the path to relapse. Members also processed their feeling towards relapse and were able to relate to common experiences. Group also discussed coping skills that can be used for relapse prevention. Pt was very tearful during group and spoke about being in a very low place that she doesn't know how to get out of. Pt states "It's humiliating to have a mental illness. I'm ashamed of the things I've said and done because of it". Pt reports that she does not know what to do to feel better and doesn't feel confident that she will ever feel better.      Therapeutic Modalities:   Cognitive Behavioral Therapy Solution-Focused Therapy Assertiveness Training Relapse Prevention Therapy    Jonathon JordanLynn B Gleb Mcguire, MSW, Theresia MajorsLCSWA [phone] 07/15/2016 3:54 PM

## 2016-07-15 NOTE — Tx Team (Signed)
Interdisciplinary Treatment and Diagnostic Plan Update 07/15/2016 Time of Session: 9:30am  Anjenette Gerbino  MRN: [id number]  Principal Diagnosis: Rash and nonspecific skin eruption  Secondary Diagnoses: Principal Problem:   Rash and nonspecific skin eruption Active Problems:   MDD (major depressive disorder)   Current Medications:  Current Facility-Administered Medications  Medication Dose Route Frequency Provider Last Rate Last Dose  . acetaminophen (TYLENOL) tablet 650 mg  650 mg Oral Q6H PRN Kerrie Buffalo, NP   650 mg at 07/15/16 6226  . alum & mag hydroxide-simeth (MAALOX/MYLANTA) 200-200-20 MG/5ML suspension 30 mL  30 mL Oral Q4H PRN Kerrie Buffalo, NP      . buPROPion (WELLBUTRIN XL) 24 hr tablet 150 mg  150 mg Oral Daily Jenne Campus, MD   150 mg at 07/15/16 1009  . feeding supplement (ENSURE ENLIVE) (ENSURE ENLIVE) liquid 237 mL  237 mL Oral BID BM Myer Peer Cobos, MD   237 mL at 07/15/16 1009  . hydrocortisone cream 1 %   Topical TID Debbe Odea, MD      . hydrOXYzine (ATARAX/VISTARIL) tablet 25 mg  25 mg Oral TID PRN Kerrie Buffalo, NP   25 mg at 07/15/16 1009  . ibuprofen (ADVIL,MOTRIN) tablet 400 mg  400 mg Oral Q6H PRN Kerrie Buffalo, NP   400 mg at 07/15/16 1007  . lurasidone (LATUDA) tablet 20 mg  20 mg Oral Q breakfast Jenne Campus, MD   20 mg at 07/15/16 1008  . magnesium hydroxide (MILK OF MAGNESIA) suspension 30 mL  30 mL Oral Daily PRN Kerrie Buffalo, NP      . neomycin-bacitracin-polymyxin (NEOSPORIN) ointment   Topical TID Debbe Odea, MD      . nicotine (NICODERM CQ - dosed in mg/24 hours) patch 21 mg  21 mg Transdermal Daily Kerrie Buffalo, NP      . thyroid (ARMOUR) tablet 90 mg  90 mg Oral QAC breakfast Jenne Campus, MD   90 mg at 07/15/16 3335  . traZODone (DESYREL) tablet 50 mg  50 mg Oral QHS PRN Kerrie Buffalo, NP   50 mg at 07/14/16 2137    PTA Medications: Prescriptions Prior to Admission  Medication Sig Dispense Refill Last Dose   . lamoTRIgine (LAMICTAL) 25 MG tablet Take 1 tablet (25 mg total) by mouth 2 (two) times daily. 30 tablet 0   . mupirocin cream (BACTROBAN) 2 % Apply topically 2 (two) times daily. 15 g 0   . thyroid (ARMOUR) 90 MG tablet Take 1 tablet (90 mg total) by mouth daily before breakfast. 30 tablet 0 unk  . venlafaxine XR (EFFEXOR-XR) 75 MG 24 hr capsule Take 1 capsule (75 mg total) by mouth daily with breakfast. 30 capsule 0 unk    Treatment Modalities: Medication Management, Group therapy, Case management,  1 to 1 session with clinician, Psychoeducation, Recreational therapy.  Patient Stressors: Financial difficulties Occupational concerns Patient Strengths: Average or above average intelligence Capable of independent living Supportive family/friends  Physician Treatment Plan for Primary Diagnosis: Rash and nonspecific skin eruption Long Term Goal(s): Improvement in symptoms so as ready for discharge Short Term Goals: Ability to maintain clinical measurements within normal limits will improve Compliance with prescribed medications will improve Ability to identify changes in lifestyle to reduce recurrence of condition will improve Ability to verbalize feelings will improve Ability to disclose and discuss suicidal ideas Ability to demonstrate self-control will improve Ability to identify and develop effective coping behaviors will improve  Medication Management: Evaluate patient's response, side  effects, and tolerance of medication regimen.  Therapeutic Interventions: 1 to 1 sessions, Unit Group sessions and Medication administration.  Evaluation of Outcomes: Not Met  Physician Treatment Plan for Secondary Diagnosis: Principal Problem:   Rash and nonspecific skin eruption Active Problems:   MDD (major depressive disorder)  Long Term Goal(s): Improvement in symptoms so as ready for discharge  Short Term Goals: Ability to maintain clinical measurements within normal limits will  improve Compliance with prescribed medications will improve Ability to identify changes in lifestyle to reduce recurrence of condition will improve Ability to verbalize feelings will improve Ability to disclose and discuss suicidal ideas Ability to demonstrate self-control will improve Ability to identify and develop effective coping behaviors will improve  Medication Management: Evaluate patient's response, side effects, and tolerance of medication regimen.  Therapeutic Interventions: 1 to 1 sessions, Unit Group sessions and Medication administration.  Evaluation of Outcomes: Not Met  RN Treatment Plan for Primary Diagnosis: Rash and nonspecific skin eruption Long Term Goal(s): Knowledge of disease and therapeutic regimen to maintain health will improve  Short Term Goals: Ability to remain free from injury will improve and Compliance with prescribed medications will improve  Medication Management: RN will administer medications as ordered by provider, will assess and evaluate patient's response and provide education to patient for prescribed medication. RN will report any adverse and/or side effects to prescribing provider.  Therapeutic Interventions: 1 on 1 counseling sessions, Psychoeducation, Medication administration, Evaluate responses to treatment, Monitor vital signs and CBGs as ordered, Perform/monitor CIWA, COWS, AIMS and Fall Risk screenings as ordered, Perform wound care treatments as ordered.  Evaluation of Outcomes: Not Met  LCSW Treatment Plan for Primary Diagnosis: Rash and nonspecific skin eruption Long Term Goal(s): Safe transition to appropriate next level of care at discharge, Engage patient in therapeutic group addressing interpersonal concerns. Short Term Goals: Engage patient in aftercare planning with referrals and resources, Increase ability to appropriately verbalize feelings, Increase emotional regulation, Identify triggers associated with mental health/substance  abuse issues and Increase skills for wellness and recovery  Therapeutic Interventions: Assess for all discharge needs, 1 to 1 time with Social worker, Explore available resources and support systems, Assess for adequacy in community support network, Educate family and significant other(s) on suicide prevention, Complete Psychosocial Assessment, Interpersonal group therapy.  Evaluation of Outcomes: Not Met  Progress in Treatment: Attending groups: Yes Participating in groups: Yes Taking medication as prescribed: Yes, MD continues to assess for medication changes as needed Toleration medication: Yes, no side effects reported at this time Family/Significant other contact made: Yes, pt's mother contacted. Patient understands diagnosis: Continuing to assess Discussing patient identified problems/goals with staff: Yes Medical problems stabilized or resolved: Yes Denies suicidal/homicidal ideation: No, pt endorses SI. Issues/concerns per patient self-inventory: None Other: N/A  New problem(s) identified: None identified at this time.   New Short Term/Long Term Goal(s): None identified at this time.   Discharge Plan or Barriers: CSW still assessing for an appropriate plan.  Reason for Continuation of Hospitalization:  Anxiety  Depression Medication stabilization Suicidal ideation  Estimated Length of Stay: 3-5 days  Attendees: Patient: 07/15/2016 4:08 PM  Physician: Dr. Parke Poisson 07/15/2016 4:08 PM  Nursing: Benjamine Mola RN; Opal Sidles, RN 07/15/2016 4:08 PM  RN Care Manager: Lars Pinks, RN 07/15/2016 4:08 PM  Social Worker: Matthew Saras, [place] 07/15/2016 4:08 PM  Recreational Therapist:  07/15/2016 4:08 PM  Other: Lindell Spar, NP; Samuel Jester, NP 07/15/2016 4:08 PM  Other:  07/15/2016 4:08 PM  Other: 07/15/2016 4:08 PM  Scribe for Treatment Team: Georga Kaufmann, MSW,LCSWA 07/15/2016 4:08 PM

## 2016-07-15 NOTE — Progress Notes (Signed)
Adult Psychoeducational Group Note  Date:  07/15/2016 Time:  11:09 PM  Group Topic/Focus:  Wrap-Up Group:   The focus of this group is to help patients review their daily goal of treatment and discuss progress on daily workbooks.  Participation Level:  Active  Participation Quality:  Appropriate  Affect:  Appropriate  Cognitive:  Appropriate  Insight: Appropriate  Engagement in Group:  Engaged  Modes of Intervention:  Discussion  Additional Comments:  Patient attended group and said that her day was a 5. Her coping skill were socializing and doing puzzles.   Chong January W Adalynne Steffensmeier 07/15/2016, 11:09 PM

## 2016-07-15 NOTE — Progress Notes (Signed)
D: Patient's self inventory sheet: patient has good sleep, recieved sleep medication.fair  Appetite, low energy level, poor concentration. Rated depression 10/10, hopeless 10/10, anxiety 8/10. SI/HI/AVH: Denies all, but remains profoundly depressed and tearful. Physical complaints are pain on left side of body. Goal is "stay up and not isolate". Plans to work on "stay in group room".   A: Medications administered, assessed medication knowledge and education given on medication regimen.  Emotional support and encouragement given patient. R: Denies SI and HI , contracts for safety. Safety maintained with 15 minute checks.

## 2016-07-15 NOTE — Progress Notes (Signed)
Recreation Therapy Notes  Date: 07/15/16 Time: 0930 Location: 400 [address] Dayroom  Group Topic: Stress Management  Goal Area(s) Addresses:  Patient will verbalize importance of using healthy stress management.  Patient will identify positive emotions associated with healthy stress management.   Intervention: Stress Management  Activity :  Meditation.  LRT introduced the stress management technique of meditation.  LRT played a meditation on the importance of letting go of past events and be in the moment.  Patients were to follow along as the meditation played to fully engage in the technique.  Education:  Stress Management, Discharge Planning.   Education Outcome: Acknowledges edcuation/In group clarification offered/Needs additional education  Clinical Observations/Feedback: Pt did not attend group.    Newell Wafer, LRT/CTRS         Jacqualine Weichel A 07/15/2016 11:16 AM 

## 2016-07-15 NOTE — Consult Note (Signed)
PROGRESS NOTE    Tina [name]   ZOX:[id number]  DOB: [birth year]  DOA: 07/12/2016 PCP: Patient, No Pcp Per   Brief Narrative:  Tina [name] is an 46 y.o. female with Bipolar disorder who was admitted to the ICU for Tegretol overdose as a suicide attempt. We are called for a rash that she is concerned about. One lesion is on her left inner ankle which had blistered and is now open. Second is on her left foot with is itchy and the third is under her left eye, swollen and also itchy.   She states she began having acne after being placed on psych medications.   Subjective: Mild improvement in rash  Assessment & Plan:   Principal Problem:   Rash and nonspecific skin eruption - cont 1 % Hydrocortisone to rash on face and left leg and antibacterial ointment to rash on right leg  Active Problems:   MDD (major depressive disorder) Per psych  Antimicrobials:  Anti-infectives    None       Objective: Vitals:   07/14/16 0721 07/14/16 0722 07/14/16 1420 07/14/16 1700  BP: 102/61 102/70 102/70 98/68  Pulse: 69 92 92 69  Resp: 16     Temp: 98.7 F (37.1 C)     TempSrc: Oral     Weight:      Height:       No intake or output data in the 24 hours ending 07/15/16 1629 Filed Weights   07/12/16 1346  Weight: 53.1 kg (117 lb)    Examination: General exam: Appears comfortable  HEENT: PERRLA, oral mucosa moist, no sclera icterus or thrush Respiratory system: Clear to auscultation. Respiratory effort normal. Cardiovascular system: S1 & S2 heard, RRR.  No murmurs  Gastrointestinal system: Abdomen soft, non-tender, nondistended. Normal bowel sound. No organomegaly Central nervous system: Alert and oriented. No focal neurological deficits. Extremities: No cyanosis, clubbing or edema Skin:  Skin is warm and dry. Erythmatous circular non blanching area on left foot, ruptured blister/ denuded skin on right inner thigh about 3 cm in diameter, area of swelling and dry  skin under right eye mildly erythematous. Psychiatry:  Mood & affect appropriate.     Data Reviewed: I have personally reviewed following labs and imaging studies  CBC:  Recent Labs Lab 07/08/16 2358 07/09/16 0458 07/12/16 0336  WBC  --  11.0* 4.3  NEUTROABS  --   --  2.1  HGB 13.3 12.5 11.0*  HCT 39.0 36.3 31.4*  MCV  --  96.3 93.7  PLT  --  172 150   Basic Metabolic Panel:  Recent Labs Lab 07/08/16 2326 07/08/16 2358 07/09/16 0458 07/10/16 0225 07/11/16 0423 07/12/16 0336  NA 138 141 141 140  --  138  K 3.9 3.4* 4.1 3.6  --  3.5  CL 103 107 109 108  --  109  CO2 25  --  22 24  --  25  GLUCOSE 140* 114* 90 108*  --  99  BUN 10 9 8 6   --  <5*  CREATININE 0.75 0.40* 0.57 0.60  --  0.45  CALCIUM 9.4  --  8.5* 8.4*  --  8.2*  MG  --   --  1.6* 1.9 1.6* 1.6*  PHOS  --   --   --  2.8 3.5 3.7   GFR: Estimated Creatinine Clearance: 65.1 mL/min (by C-G formula based on SCr of 0.45 mg/dL). Liver Function Tests:  Recent Labs Lab 07/08/16 2326  AST 61*  ALT 28  ALKPHOS 54  BILITOT 0.6  PROT 6.5  ALBUMIN 3.8   No results for input(s): LIPASE, AMYLASE in the last 168 hours. No results for input(s): AMMONIA in the last 168 hours. Coagulation Profile: No results for input(s): INR, PROTIME in the last 168 hours. Cardiac Enzymes:  Recent Labs Lab 07/10/16 0908 07/11/16 0423 07/12/16 0336  CKTOTAL 1,446* 572* 301*   BNP (last 3 results) No results for input(s): PROBNP in the last 8760 hours. HbA1C: No results for input(s): HGBA1C in the last 72 hours. CBG:  Recent Labs Lab 07/08/16 2351  GLUCAP 106*   Lipid Profile: No results for input(s): CHOL, HDL, LDLCALC, TRIG, CHOLHDL, LDLDIRECT in the last 72 hours. Thyroid Function Tests: No results for input(s): TSH, T4TOTAL, FREET4, T3FREE, THYROIDAB in the last 72 hours. Anemia Panel: No results for input(s): VITAMINB12, FOLATE, FERRITIN, TIBC, IRON, RETICCTPCT in the last 72 hours. Urine analysis:      Component Value Date/Time   COLORURINE YELLOW 07/08/2016 2335   APPEARANCEUR CLEAR 07/08/2016 2335   LABSPEC 1.014 07/08/2016 2335   PHURINE 5.0 07/08/2016 2335   GLUCOSEU NEGATIVE 07/08/2016 2335   HGBUR SMALL (A) 07/08/2016 2335   BILIRUBINUR NEGATIVE 07/08/2016 2335   KETONESUR NEGATIVE 07/08/2016 2335   PROTEINUR NEGATIVE 07/08/2016 2335   UROBILINOGEN 0.2 12/12/2014 1311   NITRITE NEGATIVE 07/08/2016 2335   LEUKOCYTESUR NEGATIVE 07/08/2016 2335   Sepsis Labs: @LABRCNTIP (procalcitonin:4,lacticidven:4) ) Recent Results (from the past 240 hour(s))  MRSA PCR Screening     Status: None   Collection Time: 07/09/16  5:44 AM  Result Value Ref Range Status   MRSA by PCR NEGATIVE NEGATIVE Final    Comment:        The GeneXpert MRSA Assay (FDA approved for NASAL specimens only), is one component of a comprehensive MRSA colonization surveillance program. It is not intended to diagnose MRSA infection nor to guide or monitor treatment for MRSA infections.          Radiology Studies: No results found.    Scheduled Meds: . buPROPion  150 mg Oral Daily  . feeding supplement (ENSURE ENLIVE)  237 mL Oral BID BM  . hydrocortisone cream   Topical TID  . lurasidone  20 mg Oral Q breakfast  . neomycin-bacitracin-polymyxin   Topical TID  . nicotine  21 mg Transdermal Daily  . thyroid  90 mg Oral QAC breakfast   Continuous Infusions:   LOS: 3 days    Time spent in minutes: 35    Calvert CantorSaima Satrina Magallanes, MD [facility] Pager: [email or web address] Password [email or web address] 07/15/2016, 4:29 PM

## 2016-07-15 NOTE — Progress Notes (Signed)
[facility] MD Progress Note  07/15/2016 2:28 PM Tina [name]  MRN:  [id number]  Subjective: Tina [name] reports "I'm sad for a million reasons. I don't think that I'm improving". At this time, denies suicidal ideations. Denies medication side effects. Says she is missing her children, saw them last a month ago. She presents tearful today.  Objective : I have discussed case with treatment team and have met with patient . She is presenting with worsening symptoms of depression with intermittent crying spells. Overall reports ongoing significant depression for several months prior to her admission. Denies SI at this time. Denies medication side effects. Of note, she has stated that Lamictal has been an effective medication for her. However, patient has noted a blistering skin lesion on her ankle. (has been present for several days, patient feels it started BEFORE her Carbamazepine overdose) . She states that she has also noted another distinct macular area on her other foot. There is no other rash noted, no mucosal involvement , and no systemic symptoms, no fever. At this time she is more visible on unit, starting to come out of her room more, visible in day room, going to some groups. Patient feels that Effexor XR worked at first, but seemed to have stopped working . We discussed other options, she remembers she did "OK" with Wellbutrin in the past . Does not remember having had side effects. Denies history of seizure disorder or of eating disorder. Wellbutrin was started yesterday. She is instructed to be patient for the medication to get in her system first. Tina [name] is worried about financial difficulties & how her past mistakes may have cost her everything. She continues to require encouragement & support.  Principal Problem: Bipolar Disorder, Depressed   Diagnosis:   Patient Active Problem List   Diagnosis Date Noted  . Rash and nonspecific skin eruption [R21] 07/14/2016  . Tegretol toxicity [T42.1X1A]  07/09/2016  . Electrolyte imbalance [E87.8] 07/09/2016  . Bipolar I disorder, most recent episode depressed ([place]) [F31.30] 06/19/2016  . MDD (major depressive disorder) [F32.9] 06/18/2016  . Bipolar 1 disorder, manic, full remission ([place]) [F31.74] 06/18/2016  . Migraine [G43.909] 02/20/2015  . Affective psychosis, bipolar ([place]) [F31.9]   . Bipolar disorder, current episode depressed, severe, without psychotic features ([place]) [F31.4]   . Bipolar affective disorder, current episode severe ([place]) [F31.9] 12/02/2014  . Bipolar I disorder, current or most recent episode depressed, in partial remission ([place]) [F31.75] 07/11/2014  . Bipolar 1 disorder, mixed ([place]) [F31.60] 06/27/2014  . Disease of thyroid gland [E07.9] 06/27/2014  . Anxiety [F41.9] 06/27/2014  . 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 Cannabis dependence without physiological dependence ([place]) [F12.20] 06/27/2014  . Bipolar 1 disorder, depressed, partial remission ([place]) [F31.75] 06/27/2014  . 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 Alcohol use disorder, severe, dependence ([place]) [F10.20] 06/24/2014  . Bipolar disorder, unspecified ([place]) [F31.9] 02/16/2013  . Adrenal insufficiency ([place]) [E27.40]   . Acne [L70.9]   . Hx of Lyme disease [Z86.19]   . 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 Bipolar I disorder, most recent episode (or current) manic ([place]) [F31.10] 08/29/2012  . Hypothyroidism [E03.9] 12/23/2010  . Myalgia [M79.1] 12/23/2010  . Fatigue [R53.83] 12/23/2010  . Unspecified vitamin deficiency [E56.9] 12/23/2010  . Depression [F32.9]    Total Time spent with patient: 15 minutes  Past Medical History:  Past Medical History:  Diagnosis Date  . Acne   . Adrenal insufficiency ([place])   . Bipolar 2 disorder ([place])   . Cellulitis   .  Depression   . Genital warts   . Headache   . History of chicken pox   . Hx of Lyme disease   . Hypothyroidism     Past Surgical History:  Procedure Laterality Date  . CESAREAN  SECTION  2003, 2007, 2011  . INGUINAL HERNIA REPAIR Bilateral   . TONSILLECTOMY    . UMBILICAL HERNIA REPAIR     Family History:  Family History  Problem Relation Age of Onset  . Other Mother     PAD  . Heart disease Other     Grandparent  . Alcohol abuse Paternal Grandmother   . Mental retardation Paternal Grandmother   . Arthritis Maternal Grandfather   . Heart disease Maternal Grandfather    Social History:  History  Alcohol Use  . Yes    Comment: social drinker-few drinks a week      History  Drug Use No    Social History   Social History  . Marital status: Single    Spouse name: N/A  . Number of children: N/A  . Years of education: 72   Occupational History  . HOMEMAKER    Social History Main Topics  . Smoking status: Current Every Day Smoker    Packs/day: 1.50    Years: 29.00    Types: Cigarettes  . Smokeless tobacco: Never Used  . Alcohol use Yes     Comment: social drinker-few drinks a week   . Drug use: No  . Sexual activity: Not Currently   Other Topics Concern  . None   Social History Narrative   Regular exercise-no   Additional Social History:   Sleep: improving   Appetite:  Fair  Current Medications: Current Facility-Administered Medications  Medication Dose Route Frequency Provider Last Rate Last Dose  . acetaminophen (TYLENOL) tablet 650 mg  650 mg Oral Q6H PRN Kerrie Buffalo, NP   650 mg at 07/15/16 4098  . alum & mag hydroxide-simeth (MAALOX/MYLANTA) 200-200-20 MG/5ML suspension 30 mL  30 mL Oral Q4H PRN Kerrie Buffalo, NP      . buPROPion (WELLBUTRIN XL) 24 hr tablet 150 mg  150 mg Oral Daily Jenne Campus, MD   150 mg at 07/15/16 1009  . feeding supplement (ENSURE ENLIVE) (ENSURE ENLIVE) liquid 237 mL  237 mL Oral BID BM Myer Peer Cobos, MD   237 mL at 07/15/16 1009  . hydrocortisone cream 1 %   Topical TID Debbe Odea, MD      . hydrOXYzine (ATARAX/VISTARIL) tablet 25 mg  25 mg Oral TID PRN Kerrie Buffalo, NP   25 mg at  07/15/16 1009  . ibuprofen (ADVIL,MOTRIN) tablet 400 mg  400 mg Oral Q6H PRN Kerrie Buffalo, NP   400 mg at 07/15/16 1007  . lurasidone (LATUDA) tablet 20 mg  20 mg Oral Q breakfast Jenne Campus, MD   20 mg at 07/15/16 1008  . magnesium hydroxide (MILK OF MAGNESIA) suspension 30 mL  30 mL Oral Daily PRN Kerrie Buffalo, NP      . nicotine (NICODERM CQ - dosed in mg/24 hours) patch 21 mg  21 mg Transdermal Daily Kerrie Buffalo, NP      . thyroid (ARMOUR) tablet 90 mg  90 mg Oral QAC breakfast Jenne Campus, MD   90 mg at 07/15/16 1191  . traZODone (DESYREL) tablet 50 mg  50 mg Oral QHS PRN Kerrie Buffalo, NP   50 mg at 07/14/16 2137   Lab Results: No results found for this  or any previous visit (from the past 48 hour(s)).  Blood Alcohol level:  Lab Results  Component Value Date   ETH <5 07/08/2016   ETH <5 06/18/2016   Metabolic Disorder Labs: Lab Results  Component Value Date   HGBA1C 5.1 12/03/2014   MPG 100 12/03/2014   MPG 100 12/02/2014   No results found for: PROLACTIN Lab Results  Component Value Date   CHOL 195 12/03/2014   TRIG 122 12/03/2014   HDL 42 12/03/2014   CHOLHDL 4.6 12/03/2014   VLDL 24 12/03/2014   LDLCALC 129 (H) 12/03/2014   Physical Findings: AIMS: Facial and Oral Movements Muscles of Facial Expression: None, normal Lips and Perioral Area: None, normal Jaw: None, normal Tongue: None, normal,Extremity Movements Upper (arms, wrists, hands, fingers): None, normal Lower (legs, knees, ankles, toes): None, normal, Trunk Movements Neck, shoulders, hips: None, normal, Overall Severity Severity of abnormal movements (highest score from questions above): None, normal Incapacitation due to abnormal movements: None, normal Patient's awareness of abnormal movements (rate only patient's report): No Awareness, Dental Status Current problems with teeth and/or dentures?: No Does patient usually wear dentures?: No  CIWA:  CIWA-Ar Total: 1 COWS:  COWS Total  Score: 2  Musculoskeletal: Strength & Muscle Tone: within normal limits Gait & Station: normal Patient leans: N/A  Psychiatric Specialty Exam: Physical Exam  ROS no fever, no chills, no vomiting   Blood pressure 98/68, pulse 69, temperature 98.7 F (37.1 C), temperature source Oral, resp. rate 16, height 5' (1.524 m), weight 53.1 kg (117 lb).Body mass index is 22.85 kg/m.  General Appearance: Fairly Groomed  Eye Contact:  Good  Speech:  Normal Rate  Volume:  Decreased  Mood:  Depressed  Affect:  constricted, sad, does smile briefly at times   Thought Process:  Linear and Descriptions of Associations: Intact  Orientation:  Full (Time, Place, and Person)  Thought Content:  no hallucinations, no delusions, not internally preoccupied   Suicidal Thoughts:  No denies any active suicidal ideations , denies any self injurious ideations, contracts for safety on unit   Homicidal Thoughts:  No  Memory:  recent and remote grossly intact  Judgement:  Fair  Insight:  Fair  Psychomotor Activity:  Decreased  Concentration:  Concentration: Good and Attention Span: Good  Recall:  Good  Fund of Knowledge:  Good  Language:  Good  Akathisia:  Negative  Handed:  Right  AIMS (if indicated):     Assets:  Communication Skills Desire for Improvement Resilience  ADL's:  Intact  Cognition:  WNL  Sleep:  Number of Hours: 6.5   Assessment - patient remains depressed, constricted in affect, but today smiling briefly at times. Denies any current suicidal ideations. She presents with two localized skin lesions on both extremities, one of which is blistering , and which she states has been present for a few days. There is no mucosal involvement or any  systemic symptoms at this time. Patient reports history of good response and tolerance to Wellbutrin in the past   Will continue today 07/15/16 plan as below except where it is noted.  Treatment Plan Summary: Daily contact with patient to assess and  evaluate symptoms and progress in treatment, Medication management, Plan inpatient treatment  and medications as below Encourage ongoing group and milieu participation to work on coping skills and symptom reduction  Continue  Latuda 20 mgrs QDAY  for mood disorder- patient prefers to titrate gradually to minimize side effects Continue Vistaril 25 mgrs Q 8  hours PRN for anxiety as needed  Continue Trazodone 50 mgrs QHS PRN for insomnia as needed  Continue Armour Thyroid for Hypothyroidism Would not restart Lamictal at this time due to above described skin lesions  Start Wellbutrin XL 150 mgrs QAM for depression I have requested hospitalist consultation to review above reported skin lesions .    Encarnacion Slates, NP, PMHNP, FNP-BC 07/15/2016, 2:28 PMPatient ID: Napoleon Form, female   DOB: 03/01/1971, 46 y.o.   MRN: [id number]

## 2016-07-16 DIAGNOSIS — F332 Major depressive disorder, recurrent severe without psychotic features: Secondary | ICD-10-CM | POA: Diagnosis present

## 2016-07-16 MED ORDER — ARIPIPRAZOLE 2 MG PO TABS
2.0000 mg | ORAL_TABLET | Freq: Every day | ORAL | Status: DC
  Administered 2016-07-16 – 2016-07-17 (×2): 2 mg via ORAL
  Filled 2016-07-16 (×4): qty 1

## 2016-07-16 MED ORDER — ARIPIPRAZOLE 2 MG PO TABS: 2.0000 mg | ORAL_TABLET | Freq: Every day | ORAL | Status: DC

## 2016-07-16 NOTE — Progress Notes (Signed)
[facility] MD Progress Note  07/16/2016 9:41 AM Tina [name]  MRN:  [id number]  Subjective:  "I felt so weird from the Latuda, like detached like I'm in my body but detached and under water. I had a reaction to another antipsychotic similar to this in the past. I refused my last 2 doses and I'm feeling somewhat better. Please switch me to something else."  Objective: Pt seen and chart reviewed. Pt is alert/oriented x4, calm, cooperative, and appropriate to situation. Pt denies suicidal/homicidal ideation and psychosis and does not appear to be responding to internal stimuli. Pt continues to endorse depression and some racing thoughts as well as feelings of detachment. Pt refuses Latuda yet is receptive to Abilify. We will trial 2mg  po daily and consider titration to 5mg . Pt endorses daytime anxiety so we will start po daily and will move to qhs if excessive sedation with dose titration. Discussed risks, benefits, outcomes, alternatives with pt and pt affirmed understanding to start this medication.   Principal Problem: Bipolar Disorder, Depressed   Diagnosis:   Patient Active Problem List   Diagnosis Date Noted  . Rash and nonspecific skin eruption [R21] 07/14/2016  . Tegretol toxicity [T42.1X1A] 07/09/2016  . Electrolyte imbalance [E87.8] 07/09/2016  . Bipolar I disorder, most recent episode depressed (HCC) [F31.30] 06/19/2016  . MDD (major depressive disorder) [F32.9] 06/18/2016  . Bipolar 1 disorder, manic, full remission (HCC) [F31.74] 06/18/2016  . Migraine [G43.909] 02/20/2015  . Affective psychosis, bipolar (HCC) [F31.9]   . Bipolar disorder, current episode depressed, severe, without psychotic features (HCC) [F31.4]   . Bipolar affective disorder, current episode severe (HCC) [F31.9] 12/02/2014  . Bipolar I disorder, current or most recent episode depressed, in partial remission (HCC) [F31.75] 07/11/2014  . Bipolar 1 disorder, mixed (HCC) [F31.60] 06/27/2014  . Disease of thyroid  gland [E07.9] 06/27/2014  . Anxiety [F41.9] 06/27/2014  . 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 Bipolar 1 disorder, depressed, partial remission (HCC) [F31.75] 06/27/2014  . 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 Alcohol use disorder, severe, dependence (HCC) [F10.20] 06/24/2014  . Bipolar disorder, unspecified (HCC) [F31.9] 02/16/2013  . Adrenal insufficiency (HCC) [E27.40]   . Acne [L70.9]   . Hx of Lyme disease [Z86.19]   . 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 Bipolar I disorder, most recent episode (or current) manic (HCC) [F31.10] 08/29/2012  . Hypothyroidism [E03.9] 12/23/2010  . Myalgia [M79.1] 12/23/2010  . Fatigue [R53.83] 12/23/2010  . Unspecified vitamin deficiency [E56.9] 12/23/2010  . Depression [F32.9]    Total Time spent with patient: 15 minutes  Past Medical History:  Past Medical History:  Diagnosis Date  . Acne   . Adrenal insufficiency (HCC)   . Bipolar 2 disorder (HCC)   . Cellulitis   . Depression   . Genital warts   . Headache   . History of chicken pox   . Hx of Lyme disease   . Hypothyroidism     Past Surgical History:  Procedure Laterality Date  . CESAREAN SECTION  2003, 2007, 2011  . INGUINAL HERNIA REPAIR Bilateral   . TONSILLECTOMY    . UMBILICAL HERNIA REPAIR     Family History:  Family History  Problem Relation Age of Onset  . Other Mother     PAD  . Heart disease Other     Grandparent  . Alcohol abuse Paternal Grandmother   . Mental retardation Paternal Grandmother   . Arthritis Maternal Grandfather   .  Heart disease Maternal Grandfather    Social History:  History  Alcohol Use  . Yes    Comment: social drinker-few drinks a week      History  Drug Use No    Social History   Social History  . Marital status: Single    Spouse name: N/A  . Number of children: N/A  . Years of education: 8216   Occupational History  . HOMEMAKER     Social History Main Topics  . Smoking status: Current Every Day Smoker    Packs/day: 1.50    Years: 29.00    Types: Cigarettes  . Smokeless tobacco: Never Used  . Alcohol use Yes     Comment: social drinker-few drinks a week   . Drug use: No  . Sexual activity: Not Currently   Other Topics Concern  . None   Social History Narrative   Regular exercise-no   Additional Social History:   Sleep: improving   Appetite:  Fair  Current Medications: Current Facility-Administered Medications  Medication Dose Route Frequency Provider Last Rate Last Dose  . acetaminophen (TYLENOL) tablet 650 mg  650 mg Oral Q6H PRN Adonis BrookAgustin, Sheila, NP   650 mg at 07/15/16 1711  . alum & mag hydroxide-simeth (MAALOX/MYLANTA) 200-200-20 MG/5ML suspension 30 mL  30 mL Oral Q4H PRN Adonis BrookAgustin, Sheila, NP      . buPROPion (WELLBUTRIN XL) 24 hr tablet 150 mg  150 mg Oral Daily Cobos, Rockey SituFernando A, MD   150 mg at 07/15/16 1009  . feeding supplement (ENSURE ENLIVE) (ENSURE ENLIVE) liquid 237 mL  237 mL Oral BID BM Cobos, Rockey SituFernando A, MD   237 mL at 07/15/16 1009  . hydrocortisone cream 1 %   Topical TID Calvert Cantorizwan, Saima, MD      . hydrOXYzine (ATARAX/VISTARIL) tablet 25 mg  25 mg Oral TID PRN Adonis BrookAgustin, Sheila, NP   25 mg at 07/15/16 1009  . ibuprofen (ADVIL,MOTRIN) tablet 400 mg  400 mg Oral Q6H PRN Adonis BrookAgustin, Sheila, NP   400 mg at 07/15/16 1007  . lurasidone (LATUDA) tablet 20 mg  20 mg Oral Q breakfast Cobos, Rockey SituFernando A, MD   20 mg at 07/15/16 1008  . magnesium hydroxide (MILK OF MAGNESIA) suspension 30 mL  30 mL Oral Daily PRN Adonis BrookAgustin, Sheila, NP      . neomycin-bacitracin-polymyxin (NEOSPORIN) ointment   Topical TID Calvert Cantorizwan, Saima, MD      . nicotine (NICODERM CQ - dosed in mg/24 hours) patch 21 mg  21 mg Transdermal Daily Adonis BrookAgustin, Sheila, NP      . thyroid (ARMOUR) tablet 90 mg  90 mg Oral QAC breakfast Cobos, Rockey SituFernando A, MD   90 mg at 07/16/16 [phone]  . traZODone (DESYREL) tablet 50 mg  50 mg Oral QHS PRN Adonis BrookAgustin,  Sheila, NP   50 mg at 07/15/16 2126   Lab Results: No results found for this or any previous visit (from the past 48 hour(s)).  Blood Alcohol level:  Lab Results  Component Value Date   ETH <5 07/08/2016   ETH <5 06/18/2016   Metabolic Disorder Labs: Lab Results  Component Value Date   HGBA1C 5.1 12/03/2014   MPG 100 12/03/2014   MPG 100 12/02/2014   No results found for: PROLACTIN Lab Results  Component Value Date   CHOL 195 12/03/2014   TRIG 122 12/03/2014   HDL 42 12/03/2014   CHOLHDL 4.6 12/03/2014   VLDL 24 12/03/2014   LDLCALC 129 (H) 12/03/2014   Physical Findings:  AIMS: Facial and Oral Movements Muscles of Facial Expression: None, normal Lips and Perioral Area: None, normal Jaw: None, normal Tongue: None, normal,Extremity Movements Upper (arms, wrists, hands, fingers): None, normal Lower (legs, knees, ankles, toes): None, normal, Trunk Movements Neck, shoulders, hips: None, normal, Overall Severity Severity of abnormal movements (highest score from questions above): None, normal Incapacitation due to abnormal movements: None, normal Patient's awareness of abnormal movements (rate only patient's report): No Awareness, Dental Status Current problems with teeth and/or dentures?: No Does patient usually wear dentures?: No  CIWA:  CIWA-Ar Total: 1 COWS:  COWS Total Score: 2  Musculoskeletal: Strength & Muscle Tone: within normal limits Gait & Station: normal Patient leans: N/A  Psychiatric Specialty Exam: Physical Exam  Review of Systems  Psychiatric/Behavioral: Positive for depression and hallucinations (racing thoughts). Negative for suicidal ideas. The patient is nervous/anxious.   All other systems reviewed and are negative.  no fever, no chills, no vomiting   Blood pressure (!) 88/60, pulse 60, temperature 98.2 F (36.8 C), temperature source Oral, resp. rate 16, height 5' (1.524 m), weight 53.1 kg (117 lb), SpO2 98 %.Body mass index is 22.85 kg/m.   General Appearance: Fairly Groomed  Eye Contact:  Good  Speech:  Normal Rate  Volume:  Decreased  Mood:  Depressed  Affect:  constricted, sad, does smile briefly at times   Thought Process:  Linear and Descriptions of Associations: Intact  Orientation:  Full (Time, Place, and Person)  Thought Content:  no hallucinations, no delusions, not internally preoccupied   Suicidal Thoughts:  No denies any active suicidal ideations , denies any self injurious ideations, contracts for safety on unit   Homicidal Thoughts:  No  Memory:  recent and remote grossly intact  Judgement:  Fair  Insight:  Fair  Psychomotor Activity:  Decreased  Concentration:  Concentration: Good and Attention Span: Good  Recall:  Good  Fund of Knowledge:  Good  Language:  Good  Akathisia:  Negative  Handed:  Right  AIMS (if indicated):     Assets:  Communication Skills Desire for Improvement Resilience  ADL's:  Intact  Cognition:  WNL  Sleep:  Number of Hours: 6.75   Assessment: Bipolar 1 disorder, mixed (HCC) improving, yet continues to endorse racing thoughts, depression, anxiety, treated as below:  On 07/16/2016 , I have reviewed medications below and concur with regimen with the following changes in bold:   Treatment Plan Summary: Encourage ongoing group and milieu participation to work on coping skills and symptom reduction  -Discontinue Latuda (strange reaction, feels out of body, improving since she refused 2 doses) -Start Abilify 2mg  po daily (will consider upward titration and/or move to qhs depending on tolerance/sedation/efficacy) Continue Vistaril 25 mg Q 8 hours PRN for anxiety as needed  Continue Trazodone 50 mg QHS PRN for insomnia as needed  Continue Armour Thyroid for Hypothyroidism Would not restart Lamictal at this time due to above described skin lesions  -Continue Wellbutrin XL 150 mg QAM for depression -Evaluated skin lesions (approximately 1" diameter) to LLE; improving per pt report;  no longer blistered or open. Pt states they are much better; will continue to monitor. May have been prodrome for SJS as they coincided with Lamictal introduction yet this med was immediately discontinued.     Beau Fanny, FNP 07/16/2016, 9:41 AM

## 2016-07-16 NOTE — Plan of Care (Signed)
Problem: Safety: Goal: Periods of time without injury will increase Outcome: Progressing Periods of time without injury will increase AEB [id number] safety checks, client remains safe on the unit.

## 2016-07-16 NOTE — [facility] Group Notes (Signed)
Adult Therapy Group Note  Date:  07/16/2016  Time:  10:00-11:00AM  Group Topic/Focus: Unhealthy vs Healthy Coping Techniques  Building Self Esteem:    The focus of this group was to determine what unhealthy coping techniques typically are used or and what healthy coping techniques would be helpful in coping with various problems. Patients were guided in becoming aware of the differences between healthy and unhealthy coping techniques.  Vignettes were used to generate ideas, which led to a discussion about personal application.     Participation Level:  Active  Participation Quality:  Attentive  Affect:  Anxious and Flat  Cognitive:  Appropriate  Insight: Improving  Engagement in Group:  Improving  Modes of Intervention:  Exercise, Discussion and Support  Additional Comments:  The patient expressed that an unhealthy coping technique she uses is isolation, and said she did not know of a healthy skill she wants to learn, and said that is part of why she is in the hospital, because she cannot think of healthy things to do.  Ambrose MantleMareida Grossman-Orr, LCSW 07/16/2016   3:52 PM

## 2016-07-16 NOTE — Plan of Care (Signed)
Problem: Activity: Goal: Interest or engagement in activities will improve Outcome: Progressing Patient has been more present in the milieu, interacting with peers and staff.  Problem: Education: Goal: Knowledge of [facility] Education information/materials will improve Outcome: Completed/Met Date Met: 07/16/16 Patient understands [facility] information and [facility] materials Goal: Emotional status will improve Outcome: Not Progressing Patient continues to express profound sadness and depression, tearful when talking to staff.  Problem: Coping: Goal: Ability to demonstrate self-control will improve Outcome: Progressing Patient has had no episodes of behavioral dyscontrol.  Problem: Health Behavior/Discharge Planning: Goal: Compliance with treatment plan for underlying cause of condition will improve Outcome: Progressing Patient communicative with staff regarding treatment plan and response to medications.  Problem: Safety: Goal: Periods of time without injury will increase Outcome: Progressing Patient has not made any attempts at self injury since admitted to [facility]  Problem: Safety: Goal: Ability to disclose and discuss suicidal ideas will improve Outcome: Progressing Patient is open about her thoughts of suicide and willing to disclose them.  Problem: Coping: Goal: Ability to cope will improve Outcome: Not Progressing Patient overwhelmed by physical and emotional symptoms and complaints  Problem: Education: Goal: Emotional status will improve Outcome: Not Progressing Patient continues to express hopelessness and despair.

## 2016-07-17 MED ORDER — ARIPIPRAZOLE 5 MG PO TABS: 5.0000 mg | ORAL_TABLET | Freq: Every day | ORAL | Status: DC

## 2016-07-17 MED ORDER — ARIPIPRAZOLE 5 MG PO TABS
5.0000 mg | ORAL_TABLET | Freq: Every day | ORAL | Status: DC
  Administered 2016-07-17 – 2016-07-22 (×6): 5 mg via ORAL
  Filled 2016-07-17 (×8): qty 1

## 2016-07-17 NOTE — Plan of Care (Signed)
Problem: Coping: Goal: Ability to cope will improve Outcome: Progressing Pt stated she smiled earlier today and it was a good afternoon

## 2016-07-17 NOTE — Progress Notes (Signed)
D: Pt denies SI/HI/AVH. Pt is pleasant and cooperative. Pt seen in the dayroom interacting sparingly with peers. Pt stated she felt the same, but had a good afternoon. Pt continues to present flat and depressed, but did brighten upon continued interaction .   A: Pt was offered support and encouragement. Pt was given scheduled medications. Pt was encourage to attend groups. Q 15 minute checks were done for safety.   R:.Pt receptive to treatment and safety maintained on unit.

## 2016-07-17 NOTE — [facility] Group Notes (Signed)
[facility] Group Notes: (Clinical Social Work)   07/17/2016      Type of Therapy:  Group Therapy   Participation Level:  Did Not Attend despite MHT prompting   Ambrose MantleMareida Grossman-Orr, LCSW 07/17/2016, 12:41 PM

## 2016-07-17 NOTE — Progress Notes (Signed)
Patient said her day was a 7. Patient said that she have lots of fun during dinner when she socialized with her peers.

## 2016-07-17 NOTE — Progress Notes (Signed)
Tina [name]. Tina [name] had been up and visible in milieu this evening, did attend and participate in evening group activity. Tina [name] spoke about her day and how she was doing better. She did complain of pain in her left wrist and did request and receive medications to help with pain and also receive bedtime medication without incident. A. Safety maintained, encouragement provided. R. Will continue to monitor.

## 2016-07-17 NOTE — [facility] Group Notes (Signed)
[facility] Group Notes:  (Nursing/MHT/Case Management/Adjunct)  Date:  07/17/2016  Time:  4:17 PM  Type of Therapy:  Psychoeducational Skills  Participation Level:  Active  Participation Quality:  Attentive  Affect:  Appropriate  Cognitive:  Appropriate  Insight:  Appropriate  Engagement in Group:  Engaged  Modes of Intervention:  Discussion and Education  Summary of Progress/Problems: Healthy Support Systems - patient attended, was engaged with content and peers. Mirranda Monrroy M Nollie Terlizzi 07/17/2016, 4:17 PM 

## 2016-07-17 NOTE — Progress Notes (Signed)
Patient attended group and said that her day was a 6. Her coping skills were socializing and watching tv.

## 2016-07-17 NOTE — Progress Notes (Signed)
[facility]  07/17/2016 12:42 PM Tina FoleyLaurie [name]  MRN:  [id number]  Subjective:  "I feel a little better today overall but slept horrible. I don't have that weird feeling on Latuda that I had, but I feel a little out of it after not sleeping much."  Objective: Pt seen and chart reviewed. Pt is alert/oriented x4, calm, cooperative, and appropriate to situation. Pt denies suicidal/homicidal ideation and psychosis and does not appear to be responding to internal stimuli. Pt reports that she slept terrible but does not feel detached like she felt on Latuda. She states that she would like to move the abilify to night time dose. Pt continues to have racing thoughts and anxiety and this is worse at night; will titrate to 5mg  po qhs. Rash on legs is improving.   Diagnosis:   Patient Active Problem List   Diagnosis Date Noted  . Bipolar 1 disorder, mixed (HCC) [F31.60] 06/27/2014    Priority: High  . Rash and nonspecific skin eruption [R21] 07/14/2016    Priority: Medium  . Tegretol toxicity [T42.1X1A] 07/09/2016  . Electrolyte imbalance [E87.8] 07/09/2016  . Bipolar I disorder, most recent episode depressed (HCC) [F31.30] 06/19/2016  . Bipolar 1 disorder, manic, full remission (HCC) [F31.74] 06/18/2016  . Migraine [G43.909] 02/20/2015  . Affective psychosis, bipolar (HCC) [F31.9]   . Bipolar disorder, current episode depressed, severe, without psychotic features (HCC) [F31.4]   . Bipolar affective disorder, current episode severe (HCC) [F31.9] 12/02/2014  . Bipolar I disorder, current or most recent episode depressed, in partial remission (HCC) [F31.75] 07/11/2014  . Disease of thyroid gland [E07.9] 06/27/2014  . Anxiety [F41.9] 06/27/2014  . 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 Bipolar 1 disorder, depressed, partial remission (HCC) [F31.75] 06/27/2014  . 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 Alcohol use disorder, severe, dependence (HCC) [F10.20] 06/24/2014  . Bipolar disorder, unspecified (HCC) [F31.9] 02/16/2013  . Adrenal insufficiency (HCC) [E27.40]   . Acne [L70.9]   . Hx of Lyme disease [Z86.19]   . 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 Bipolar I disorder, most recent episode (or current) manic (HCC) [F31.10] 08/29/2012  . Hypothyroidism [E03.9] 12/23/2010  . Myalgia [M79.1] 12/23/2010  . Fatigue [R53.83] 12/23/2010  . Unspecified vitamin deficiency [E56.9] 12/23/2010  . Depression [F32.9]    Total Time spent with patient: 25 minutes   Past Medical History:  Past Medical History:  Diagnosis Date  . Acne   . Adrenal insufficiency (HCC)   . Bipolar 2 disorder (HCC)   . Cellulitis   . Depression   . Genital warts   . Headache   . History of chicken pox   . Hx of Lyme disease   . Hypothyroidism     Past Surgical History:  Procedure Laterality Date  . CESAREAN SECTION  2003, 2007, 2011  . INGUINAL HERNIA REPAIR Bilateral   . TONSILLECTOMY    . UMBILICAL HERNIA REPAIR     Family History:  Family History  Problem Relation Age of Onset  . Other Mother     PAD  . Heart disease Other     Grandparent  . Alcohol abuse Paternal Grandmother   . Mental retardation Paternal Grandmother   . Arthritis Maternal Grandfather   . Heart disease Maternal Grandfather    Social History:  History  Alcohol Use  . Yes    Comment: social drinker-few drinks a week      History  Drug Use No    Social History   Social History  . Marital status: Single    Spouse name: N/A  . Number of children: N/A  . Years of education: 8   Occupational History  . HOMEMAKER    Social History Main Topics  . Smoking status: Current Every Day Smoker    Packs/day: 1.50    Years: 29.00    Types: Cigarettes  . Smokeless tobacco: Never Used  . Alcohol use Yes     Comment: social drinker-few drinks a week   . Drug use: No  . Sexual activity: Not Currently   Other Topics  Concern  . None   Social History Narrative   Regular exercise-no   Additional Social History:   Sleep: improving   Appetite:  Fair  Current Medications: Current Facility-Administered Medications  Medication Dose Route Frequency Provider Last Rate Last Dose  . acetaminophen (TYLENOL) tablet 650 mg  650 mg Oral Q6H PRN Adonis Brook, NP   650 mg at 07/16/16 1008  . alum & mag hydroxide-simeth (MAALOX/MYLANTA) 200-200-20 MG/5ML suspension 30 mL  30 mL Oral Q4H PRN Adonis Brook, NP      . ARIPiprazole (ABILIFY) tablet 2 mg  2 mg Oral Daily Withrow, John C, FNP   2 mg at 07/17/16 1134  . buPROPion (WELLBUTRIN XL) 24 hr tablet 150 mg  150 mg Oral Daily Cobos, Rockey Situ, MD   150 mg at 07/17/16 1134  . feeding supplement (ENSURE ENLIVE) (ENSURE ENLIVE) liquid 237 mL  237 mL Oral BID BM Cobos, Fernando A, MD   237 mL at 07/17/16 1135  . hydrocortisone cream 1 %   Topical TID Calvert Cantor, MD      . hydrOXYzine (ATARAX/VISTARIL) tablet 25 mg  25 mg Oral TID PRN Adonis Brook, NP   25 mg at 07/15/16 1009  . ibuprofen (ADVIL,MOTRIN) tablet 400 mg  400 mg Oral Q6H PRN Adonis Brook, NP   400 mg at 07/16/16 2056  . magnesium hydroxide (MILK OF MAGNESIA) suspension 30 mL  30 mL Oral Daily PRN Adonis Brook, NP      . neomycin-bacitracin-polymyxin (NEOSPORIN) ointment   Topical TID Calvert Cantor, MD      . nicotine (NICODERM CQ - dosed in mg/24 hours) patch 21 mg  21 mg Transdermal Daily Adonis Brook, NP   21 mg at 07/17/16 1103  . thyroid (ARMOUR) tablet 90 mg  90 mg Oral QAC breakfast Cobos, Rockey Situ, MD   90 mg at 07/17/16 1100  . traZODone (DESYREL) tablet 50 mg  50 mg Oral QHS PRN Adonis Brook, NP   50 mg at 07/16/16 2056   Lab Results: No results found for this or any previous visit (from the past 48 hour(s)).  Blood Alcohol level:  Lab Results  Component Value Date   ETH <5 07/08/2016   ETH <5 06/18/2016   Metabolic Disorder Labs: Lab Results  Component Value Date    HGBA1C 5.1 12/03/2014   MPG 100 12/03/2014   MPG 100 12/02/2014   No results found for: PROLACTIN Lab Results  Component Value Date   CHOL 195 12/03/2014   TRIG 122 12/03/2014   HDL 42 12/03/2014   CHOLHDL 4.6 12/03/2014   VLDL 24 12/03/2014   LDLCALC 129 (H) 12/03/2014   Physical Findings: AIMS: Facial and Oral Movements Muscles of Facial Expression: None, normal Lips and Perioral Area: None, normal Jaw: None, normal Tongue: None, normal,Extremity Movements Upper (arms, wrists, hands, fingers): None, normal  Lower (legs, knees, ankles, toes): None, normal, Trunk Movements Neck, shoulders, hips: None, normal, Overall Severity Severity of abnormal movements (highest score from questions above): None, normal Incapacitation due to abnormal movements: None, normal Patient's awareness of abnormal movements (rate only patient's report): No Awareness, Dental Status Current problems with teeth and/or dentures?: No Does patient usually wear dentures?: No  CIWA:  CIWA-Ar Total: 1 COWS:  COWS Total Score: 2  Musculoskeletal: Strength & Muscle Tone: within normal limits Gait & Station: normal Patient leans: N/A  Psychiatric Specialty Exam: Physical Exam  Review of Systems  Psychiatric/Behavioral: Positive for depression and hallucinations (racing thoughts). Negative for suicidal ideas. The patient is nervous/anxious.   All other systems reviewed and are negative.  no fever, no chills, no vomiting   Blood pressure (!) 88/60, pulse 60, temperature 98.2 F (36.8 C), temperature source Oral, resp. rate 16, height 5' (1.524 m), weight 53.1 kg (117 lb), SpO2 98 %.Body mass index is 22.85 kg/m.  General Appearance: Casual and Fairly Groomed  Eye Contact:  Fair  Speech:  Clear and Coherent and Normal Rate  Volume:  Normal  Mood:  Euthymic  Affect:  Appropriate and Congruent  Thought Process:  Linear and Descriptions of Associations: Intact  Orientation:  Full (Time, Place, and  Person)  Thought Content: Focused on discharge plans  Suicidal Thoughts:  No   Homicidal Thoughts:  No  Memory:  recent and remote grossly intact  Judgement:  Fair  Insight:  Fair   Psychomotor Activity:  Decreased  Concentration:  Concentration: Good and Attention Span: Good  Recall:  Good  Fund of Knowledge:  Good  Language:  Good  Akathisia:  Negative  Handed:  Right  AIMS (if indicated):     Assets:  Communication Skills Desire for Improvement Resilience  ADL's:  Intact  Cognition:  WNL  Sleep:  Number of Hours: 6.25   Assessment: Bipolar 1 disorder, mixed (HCC) improving, yet continues to endorse racing thoughts, depression, anxiety, treated as below:  On 07/17/2016 , I have reviewed medications below and concur with regimen with the following changes in bold:   Treatment Plan Summary: Encourage ongoing group and milieu participation to work on coping skills and symptom reduction  -Increase Abilify to 5mg  po daily; will move to qhs as pt is continuing to have trouble sleeping Continue Vistaril 25 mg Q 8 hours PRN for anxiety as needed  Continue Trazodone 50 mg QHS PRN for insomnia as needed  Continue Armour Thyroid for Hypothyroidism Would not restart Lamictal at this time due to above described skin lesions  -Continue Wellbutrin XL 150 mg QAM for depression -Evaluated skin lesions (approximately 1" diameter) to LLE; improving per pt report; no longer blistered or open. Pt states they are much better; will continue to monitor. May have been prodrome for SJS as they coincided with Lamictal introduction yet this med was immediately discontinued. On 07/17/16, rash is continuing to improve    Beau Fanny, FNP 07/17/2016, 12:42 PM

## 2016-07-18 DIAGNOSIS — F3160 Bipolar disorder, current episode mixed, unspecified: Principal | ICD-10-CM

## 2016-07-18 MED ORDER — THYROID 30 MG PO TABS
ORAL_TABLET | ORAL | Status: AC
  Filled 2016-07-18: qty 3

## 2016-07-18 NOTE — [facility] Group Notes (Signed)
[facility] LCSW Group Therapy  07/18/2016 1:15pm  Type of Therapy: Group Therapy   Topic: Overcoming Obstacles  Participation Level: Active  Participation Quality: Appropriate   Affect: Tearful  Cognitive: Appropriate and Oriented  Insight: Developing/Improving and Improving  Engagement in Therapy: Improving  Modes of Intervention: Discussion, Exploration, Problem-solving and Support  Description of Group:  In this group patients will be encouraged to explore what they see as obstacles to their own wellness and recovery. They will be guided to discuss their thoughts, feelings, and behaviors related to these obstacles. The group will process together ways to cope with barriers, with attention given to specific choices patients can make. Each patient will be challenged to identify changes they are motivated to make in order to overcome their obstacles. This group will be process-oriented, with patients participating in exploration of their own experiences as well as giving and receiving support and challenge from other group members.  Summary of Patient Progress:  Pt states that her biggest obstacles are the financial difficulties waiting for her outside of the hospital. Pt reports that when she was experiencing mania, she spent large amounts of money and racked up a huge credit card bill. Pt is unsure how she will be able to pay her bills once she leaves the hospital and also reports that she is unemployed and concerned about how she will find a job. Pt became tearful when talking about her children and reported that when she attempted suicide she wasn't thinking about how they may feel. "I just thought they would be better off without me in that moment".  Therapeutic Modalities:  Cognitive Behavioral Therapy Solution Focused Therapy Motivational Interviewing Relapse Prevention Therapy  Jonathon JordanLynn B Esai Stecklein, MSW, Theresia MajorsLCSWA [phone]

## 2016-07-18 NOTE — [facility] Suicide Risk Assessment (Signed)
[facility] INPATIENT:  Family/Significant Other Suicide Prevention Education  Suicide Prevention Education:  Education Completed; Tina [name] (mom [phone]) , has been identified by the patient as the family member/significant other with whom the patient will be residing, and identified as the person(s) who will aid the patient in the event of a mental health crisis (suicidal ideations/suicide attempt).  With written consent from the patient, the family member/significant other has been provided the following suicide prevention education, prior to the and/or following the discharge of the patient.  The suicide prevention education provided includes the following:  Suicide risk factors  Suicide prevention and interventions  National Suicide Hotline telephone number  [facility] assessment telephone number  [facility] Assistance 911  [facility] and/or [facility] telephone number  Request made of family/significant other to:  Remove weapons (e.g., guns, rifles, knives), all items previously/currently identified as safety concern.    Remove drugs/medications (over-the-counter, prescriptions, illicit drugs), all items previously/currently identified as a safety concern.  The family member/significant other verbalizes understanding of the suicide prevention education information provided.  The family member/significant other agrees to remove the items of safety concern listed above.  Pt's mother would like for pt's 3 children to be able to visit pt in the hospital. Pt's mother also thinks that pt would benefit from extended time in the hospital.  Jonathon JordanLynn B Alvie Speltz, MSW, LCSWA 07/18/2016, 3:41 PM

## 2016-07-18 NOTE — Progress Notes (Signed)
D: Pt presents with sad affect and depressed mood. Pt appears disheveled today and in scrubs. Pt reports ongoing depression. Pt rates depression 8/10. Anxiety 3/10.  Hopelessness 8/10. Pt denies suicidal thoughts. Pt have minimal interaction on the unit and appears withdrawn. Pt stated goal is to "be more social today". Pt hypotensive. Fluids encouraged. b/p reassessed. Gaspar ColaSheila May, NP.,  made aware pt hypotensive. A: Medications reviewed with pt. Medications administered as ordered per MD. Verbal support provided. Pt encouraged to attend groups. 15 minute checks performed safety.  R: Pt receptive to tx. Pt visible on the unit and have been observed engaging with other pts this afternoon.

## 2016-07-18 NOTE — Progress Notes (Signed)
Recreation Therapy Notes  Date: 07/18/16 Time: 0930 Location: 400 [address] Dayroom  Group Topic: Stress Management  Goal Area(s) Addresses:  Patient will verbalize importance of using healthy stress management.  Patient will identify positive emotions associated with healthy stress management.   Intervention: Stress Management  Activity :  De-escalating Stress.  LRT introduced the stress management technique of meditation.  LRT played a meditation from the Calm app to help patients engage in the practice of de-escalating stress.  Patients were to follow along as the meditation played to participate in the technique.  Education:  Stress Management, Discharge Planning.   Education Outcome: Acknowledges edcuation/In group clarification offered/Needs additional education  Clinical Observations/Feedback: Pt did not attend group.    Yovan Leeman, LRT/CTRS         Jaymie Mckiddy A 07/18/2016 11:33 AM 

## 2016-07-18 NOTE — Progress Notes (Signed)
[facility] MD Progress Note  07/18/2016 3:17 PM Tina [name]  MRN:  [id number]  Subjective:  "I am improving but I still find myself with low moods, sad and not motivated." Objective: Pt seen and chart reviewed. Pt is alert/oriented x4, calm, cooperative, and appropriate to situation. Pt denies suicidal/homicidal ideation and psychosis and does not appear to be responding to internal stimuli.  She has taken first dose of Abilify and no ADR's reported.  Effort in attending groups. Diagnosis:   Patient Active Problem List   Diagnosis Date Noted  . Rash and nonspecific skin eruption [R21] 07/14/2016  . Tegretol toxicity [T42.1X1A] 07/09/2016  . Electrolyte imbalance [E87.8] 07/09/2016  . Bipolar I disorder, most recent episode depressed (HCC) [F31.30] 06/19/2016  . Bipolar 1 disorder, manic, full remission (HCC) [F31.74] 06/18/2016  . Migraine [G43.909] 02/20/2015  . Affective psychosis, bipolar (HCC) [F31.9]   . Bipolar disorder, current episode depressed, severe, without psychotic features (HCC) [F31.4]   . Bipolar affective disorder, current episode severe (HCC) [F31.9] 12/02/2014  . Bipolar I disorder, current or most recent episode depressed, in partial remission (HCC) [F31.75] 07/11/2014  . Bipolar 1 disorder, mixed (HCC) [F31.60] 06/27/2014  . Disease of thyroid gland [E07.9] 06/27/2014  . Anxiety [F41.9] 06/27/2014  . 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 Bipolar 1 disorder, depressed, partial remission (HCC) [F31.75] 06/27/2014  . 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 Alcohol use disorder, severe, dependence (HCC) [F10.20] 06/24/2014  . Bipolar disorder, unspecified (HCC) [F31.9] 02/16/2013  . Adrenal insufficiency (HCC) [E27.40]   . Acne [L70.9]   . Hx of Lyme disease [Z86.19]   . 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 Bipolar I disorder, most recent episode (or current) manic (HCC)  [F31.10] 08/29/2012  . Hypothyroidism [E03.9] 12/23/2010  . Myalgia [M79.1] 12/23/2010  . Fatigue [R53.83] 12/23/2010  . Unspecified vitamin deficiency [E56.9] 12/23/2010  . Depression [F32.9]    Total Time spent with patient: 25 minutes   Past Medical History:  Past Medical History:  Diagnosis Date  . Acne   . Adrenal insufficiency (HCC)   . Bipolar 2 disorder (HCC)   . Cellulitis   . Depression   . Genital warts   . Headache   . History of chicken pox   . Hx of Lyme disease   . Hypothyroidism     Past Surgical History:  Procedure Laterality Date  . CESAREAN SECTION  2003, 2007, 2011  . INGUINAL HERNIA REPAIR Bilateral   . TONSILLECTOMY    . UMBILICAL HERNIA REPAIR     Family History:  Family History  Problem Relation Age of Onset  . Other Mother     PAD  . Heart disease Other     Grandparent  . Alcohol abuse Paternal Grandmother   . Mental retardation Paternal Grandmother   . Arthritis Maternal Grandfather   . Heart disease Maternal Grandfather    Social History:  History  Alcohol Use  . Yes    Comment: social drinker-few drinks a week      History  Drug Use No    Social History   Social History  . Marital status: Single    Spouse name: N/A  . Number of children: N/A  . Years of education: 28   Occupational History  . HOMEMAKER    Social History Main Topics  . Smoking status: Current Every Day Smoker    Packs/day: 1.50    Years: 29.00  Types: Cigarettes  . Smokeless tobacco: Never Used  . Alcohol use Yes     Comment: social drinker-few drinks a week   . Drug use: No  . Sexual activity: Not Currently   Other Topics Concern  . None   Social History Narrative   Regular exercise-no   Additional Social History:   Sleep: improving   Appetite:  Fair  Current Medications: Current Facility-Administered Medications  Medication Dose Route Frequency Provider Last Rate Last Dose  . acetaminophen (TYLENOL) tablet 650 mg  650 mg Oral Q6H  PRN Adonis BrookAgustin, Trany Chernick, NP   650 mg at 07/18/16 0845  . alum & mag hydroxide-simeth (MAALOX/MYLANTA) 200-200-20 MG/5ML suspension 30 mL  30 mL Oral Q4H PRN Adonis BrookAgustin, Guiliana Shor, NP      . ARIPiprazole (ABILIFY) tablet 5 mg  5 mg Oral QHS Beau FannyWithrow, John C, FNP   5 mg at 07/17/16 2133  . buPROPion (WELLBUTRIN XL) 24 hr tablet 150 mg  150 mg Oral Daily Cobos, Rockey SituFernando A, MD   150 mg at 07/18/16 0843  . feeding supplement (ENSURE ENLIVE) (ENSURE ENLIVE) liquid 237 mL  237 mL Oral BID BM Cobos, Fernando A, MD   237 mL at 07/17/16 1135  . hydrocortisone cream 1 %   Topical TID Calvert Cantorizwan, Saima, MD      . hydrOXYzine (ATARAX/VISTARIL) tablet 25 mg  25 mg Oral TID PRN Adonis BrookAgustin, Atley Neubert, NP   25 mg at 07/17/16 1341  . ibuprofen (ADVIL,MOTRIN) tablet 400 mg  400 mg Oral Q6H PRN Adonis BrookAgustin, Edit Ricciardelli, NP   400 mg at 07/16/16 2056  . magnesium hydroxide (MILK OF MAGNESIA) suspension 30 mL  30 mL Oral Daily PRN Adonis BrookAgustin, Trampas Stettner, NP      . neomycin-bacitracin-polymyxin (NEOSPORIN) ointment   Topical TID Calvert Cantorizwan, Saima, MD      . nicotine (NICODERM CQ - dosed in mg/24 hours) patch 21 mg  21 mg Transdermal Daily Adonis BrookAgustin, Quamel Fitzmaurice, NP   21 mg at 07/18/16 0842  . thyroid (ARMOUR) 30 MG tablet           . thyroid (ARMOUR) tablet 90 mg  90 mg Oral QAC breakfast Cobos, Rockey SituFernando A, MD   90 mg at 07/18/16 0646  . traZODone (DESYREL) tablet 50 mg  50 mg Oral QHS PRN Adonis BrookAgustin, Alyxandria Wentz, NP   50 mg at 07/17/16 2133   Lab Results: No results found for this or any previous visit (from the past 48 hour(s)).  Blood Alcohol level:  Lab Results  Component Value Date   ETH <5 07/08/2016   ETH <5 06/18/2016   Metabolic Disorder Labs: Lab Results  Component Value Date   HGBA1C 5.1 12/03/2014   MPG 100 12/03/2014   MPG 100 12/02/2014   No results found for: PROLACTIN Lab Results  Component Value Date   CHOL 195 12/03/2014   TRIG 122 12/03/2014   HDL 42 12/03/2014   CHOLHDL 4.6 12/03/2014   VLDL 24 12/03/2014   LDLCALC 129 (H) 12/03/2014    Physical Findings: AIMS: Facial and Oral Movements Muscles of Facial Expression: None, normal Lips and Perioral Area: None, normal Jaw: None, normal Tongue: None, normal,Extremity Movements Upper (arms, wrists, hands, fingers): None, normal Lower (legs, knees, ankles, toes): None, normal, Trunk Movements Neck, shoulders, hips: None, normal, Overall Severity Severity of abnormal movements (highest score from questions above): None, normal Incapacitation due to abnormal movements: None, normal Patient's awareness of abnormal movements (rate only patient's report): No Awareness, Dental Status Current problems with teeth and/or dentures?: No  Does patient usually wear dentures?: No  CIWA:  CIWA-Ar Total: 1 COWS:  COWS Total Score: 2  Musculoskeletal: Strength & Muscle Tone: within normal limits Gait & Station: normal Patient leans: N/A  Psychiatric Specialty Exam: Physical Exam  Nursing note and vitals reviewed.   Review of Systems  Psychiatric/Behavioral: Positive for depression and hallucinations (racing thoughts). Negative for suicidal ideas. The patient is nervous/anxious.   All other systems reviewed and are negative.  no fever, no chills, no vomiting   Blood pressure 101/60, pulse 60, temperature 97.9 F (36.6 C), temperature source Oral, resp. rate 16, height 5' (1.524 m), weight 53.1 kg (117 lb), SpO2 98 %.Body mass index is 22.85 kg/m.  General Appearance: Casual and Fairly Groomed  Eye Contact:  Fair  Speech:  Clear and Coherent and Normal Rate  Volume:  Normal  Mood:  Euthymic  Affect:  Appropriate and Congruent  Thought Process:  Linear and Descriptions of Associations: Intact  Orientation:  Full (Time, Place, and Person)  Thought Content: Focused on discharge plans  Suicidal Thoughts:  No   Homicidal Thoughts:  No  Memory:  recent and remote grossly intact  Judgement:  Fair  Insight:  Fair   Psychomotor Activity:  Decreased  Concentration:  Concentration:  Good and Attention Span: Good  Recall:  Good  Fund of Knowledge:  Good  Language:  Good  Akathisia:  Negative  Handed:  Right  AIMS (if indicated):     Assets:  Communication Skills Desire for Improvement Resilience  ADL's:  Intact  Cognition:  WNL  Sleep:  Number of Hours: 6.5   Assessment: Bipolar 1 disorder, mixed (HCC) improving, yet continues to endorse racing thoughts, depression, anxiety, treated as below:  On 07/18/2016, I have reviewed medications below and concur with regimen with the following changes in bold:   Treatment Plan Summary: Encourage ongoing group and milieu participation to work on coping skills and symptom reduction  -cont Abilify to 5mg  po daily; will move to qhs as pt is continuing to have trouble sleeping Continue Vistaril 25 mg Q 8 hours PRN for anxiety as needed  Continue Trazodone 50 mg QHS PRN for insomnia as needed  Continue Armour Thyroid for Hypothyroidism Would not restart Lamictal at this time due to above described skin lesions  -Continue Wellbutrin XL 150 mg QAM for depression -REvaluated skin lesions (approximately 1" diameter) to LLE; improving per pt report; no longer blistered or open. Pt states they are much better; May have been prodrome for SJS as they coincided with Lamictal introduction yet this med was immediately discontinued. On 07/17/16, rash is continuing to improve.      Lindwood Qua, NP [facility] 07/18/2016, 3:17 PM

## 2016-07-18 NOTE — Progress Notes (Signed)
Adult Psychoeducational Group Note  Date:  07/18/2016 Time:  9:08 PM  Group Topic/Focus:  Wrap-Up Group:   The focus of this group is to help patients review their daily goal of treatment and discuss progress on daily workbooks.  Participation Level:  Active  Participation Quality:  Appropriate  Affect:  Appropriate  Cognitive:  Appropriate  Insight: Appropriate  Engagement in Group:  Engaged  Modes of Intervention:  Discussion  Additional Comments: The patient expressed that she attended group     Octavio Mannshigpen, Teoman Giraud Lee 07/18/2016, 9:08 PM

## 2016-07-18 NOTE — Consult Note (Addendum)
PROGRESS NOTE    Tina [name]   HYQ:[id number]  DOB: [birth year]  DOA: 07/12/2016 PCP: Patient, No Pcp Per   Brief Narrative:  Tina [name] is an 46 y.o. female with Bipolar disorder who was admitted to the ICU for Tegretol overdose as a suicide attempt. We are called for a rash that she is concerned about. One lesion is on her left inner ankle which had blistered and is now open. Second is on her left foot with is itchy and the third is under her left eye, swollen and also itchy.   She states she began having acne after being placed on psych medications.   Subjective: Rash on left and right LE unchanged. Rash on face appears improved.  Assessment & Plan:   Principal Problem:   Rash and nonspecific skin eruption - Likely related to Lamictal which was stopped quickly and therefore reaction did not progress- can d/c Hydrocortisone as it is not helping  Active Problems:   MDD (major depressive disorder) Per psych  [facility] will sign off. Please re-consult if there are any further medical issues. Thank you.   Antimicrobials:  Anti-infectives    None       Objective: Vitals:   07/18/16 0630 07/18/16 0808 07/18/16 0809 07/18/16 1305  BP: (!) 86/64 (!) 96/57 93/63 101/60  Pulse: 65 75 93 60  Resp: 16     Temp: 97.9 F (36.6 C)     TempSrc: Oral     SpO2:      Weight:      Height:       No intake or output data in the 24 hours ending 07/18/16 1606 Filed Weights   07/12/16 1346  Weight: 53.1 kg (117 lb)    Examination: General exam: Appears comfortable  HEENT: PERRLA, oral mucosa moist, no sclera icterus or thrush Respiratory system: Clear to auscultation. Respiratory effort normal. Cardiovascular system: S1 & S2 heard, RRR.  No murmurs  Gastrointestinal system: Abdomen soft, non-tender, nondistended. Normal bowel sound. No organomegaly Central nervous system: Alert and oriented. No focal neurological deficits. Extremities: No cyanosis,  clubbing or edema Skin:  Skin is warm and dry. Erythmatous circular non blanching area on left foot, ruptured blister/ denuded skin on right inner thigh about 3 cm in diameter - improving area of swelling and dry skin under right eye  Psychiatry:  Mood & affect appropriate.     Data Reviewed: I have personally reviewed following labs and imaging studies  CBC:  Recent Labs Lab 07/12/16 0336  WBC 4.3  NEUTROABS 2.1  HGB 11.0*  HCT 31.4*  MCV 93.7  PLT 150   Basic Metabolic Panel:  Recent Labs Lab 07/12/16 0336  NA 138  K 3.5  CL 109  CO2 25  GLUCOSE 99  BUN <5*  CREATININE 0.45  CALCIUM 8.2*  MG 1.6*  PHOS 3.7   GFR: Estimated Creatinine Clearance: 65.1 mL/min (by C-G formula based on SCr of 0.45 mg/dL). Liver Function Tests: No results for input(s): AST, ALT, ALKPHOS, BILITOT, PROT, ALBUMIN in the last 168 hours. No results for input(s): LIPASE, AMYLASE in the last 168 hours. No results for input(s): AMMONIA in the last 168 hours. Coagulation Profile: No results for input(s): INR, PROTIME in the last 168 hours. Cardiac Enzymes:  Recent Labs Lab 07/12/16 0336  CKTOTAL 301*   BNP (last 3 results) No results for input(s): PROBNP in the last 8760 hours. HbA1C: No results for input(s): HGBA1C in the last 72 hours.  CBG: No results for input(s): GLUCAP in the last 168 hours. Lipid Profile: No results for input(s): CHOL, HDL, LDLCALC, TRIG, CHOLHDL, LDLDIRECT in the last 72 hours. Thyroid Function Tests: No results for input(s): TSH, T4TOTAL, FREET4, T3FREE, THYROIDAB in the last 72 hours. Anemia Panel: No results for input(s): VITAMINB12, FOLATE, FERRITIN, TIBC, IRON, RETICCTPCT in the last 72 hours. Urine analysis:    Component Value Date/Time   COLORURINE YELLOW 07/08/2016 2335   APPEARANCEUR CLEAR 07/08/2016 2335   LABSPEC 1.014 07/08/2016 2335   PHURINE 5.0 07/08/2016 2335   GLUCOSEU NEGATIVE 07/08/2016 2335   HGBUR SMALL (A) 07/08/2016 2335    BILIRUBINUR NEGATIVE 07/08/2016 2335   KETONESUR NEGATIVE 07/08/2016 2335   PROTEINUR NEGATIVE 07/08/2016 2335   UROBILINOGEN 0.2 12/12/2014 1311   NITRITE NEGATIVE 07/08/2016 2335   LEUKOCYTESUR NEGATIVE 07/08/2016 2335   Sepsis Labs: @LABRCNTIP (procalcitonin:4,lacticidven:4) ) Recent Results (from the past 240 hour(s))  MRSA PCR Screening     Status: None   Collection Time: 07/09/16  5:44 AM  Result Value Ref Range Status   MRSA by PCR NEGATIVE NEGATIVE Final    Comment:        The GeneXpert MRSA Assay (FDA approved for NASAL specimens only), is one component of a comprehensive MRSA colonization surveillance program. It is not intended to diagnose MRSA infection nor to guide or monitor treatment for MRSA infections.          Radiology Studies: No results found.    Scheduled Meds: . ARIPiprazole  5 mg Oral QHS  . buPROPion  150 mg Oral Daily  . feeding supplement (ENSURE ENLIVE)  237 mL Oral BID BM  . hydrocortisone cream   Topical TID  . neomycin-bacitracin-polymyxin   Topical TID  . nicotine  21 mg Transdermal Daily  . thyroid  90 mg Oral QAC breakfast   Continuous Infusions:   LOS: 6 days    Time spent in minutes: 35    Calvert CantorSaima Ngozi Alvidrez, MD [facility] Pager: [email or web address] Password [email or web address] 07/18/2016, 4:06 PM

## 2016-07-19 DIAGNOSIS — A6920 Lyme disease, unspecified: Secondary | ICD-10-CM

## 2016-07-19 DIAGNOSIS — E2740 Unspecified adrenocortical insufficiency: Secondary | ICD-10-CM

## 2016-07-19 DIAGNOSIS — E039 Hypothyroidism, unspecified: Secondary | ICD-10-CM

## 2016-07-19 DIAGNOSIS — R5382 Chronic fatigue, unspecified: Secondary | ICD-10-CM

## 2016-07-19 MED ORDER — BUPROPION HCL ER (XL) 150 MG PO TB24
150.0000 mg | ORAL_TABLET | Freq: Once | ORAL | Status: AC
  Administered 2016-07-19: 150 mg via ORAL
  Filled 2016-07-19 (×2): qty 1

## 2016-07-19 MED ORDER — BUPROPION HCL ER (XL) 300 MG PO TB24
300.0000 mg | ORAL_TABLET | Freq: Every day | ORAL | Status: DC
  Administered 2016-07-20: 300 mg via ORAL
  Filled 2016-07-19 (×3): qty 1

## 2016-07-19 NOTE — [facility] Group Notes (Signed)
[facility] Group Notes:  (Nursing/MHT/Case Management/Adjunct)  Date:  07/19/2016  Time:  0900 am  Type of Therapy:  Mindfulness Meditation  Participation Level:  Did not attend     Cranford MonBeaudry, Blythe Hartshorn Evans 07/19/2016, 12:03 PM

## 2016-07-19 NOTE — Progress Notes (Signed)
[facility] MD Progress Note  07/19/2016 2:06 PM Tina [name]  MRN:  [id number] Subjective:   46 yo Caucasian female, divorced, lives alone. Background history of Bipolar disorder. Patient was recently discharged from our unit. She presented two weeks after with altered mental status. Family found her unresponsive and called EMS. She had overdosed on Tegretol. patient reports main stressor as financial ruins while she was manic.   chart reviewed today. Patient discussed at team  Staff notes that she still complains of being depressed and anxious. Group participation is at 50%. She has not voiced any futility thoughts lately. No associated psychosis. Rash is same since she was taken off Lamictal.   Seen today. Says she is still very depressed. Getting self motivated is a struggle. Worse in the mornings and eases as the day progresses. Able to attend afternoon groups. Says she has been sleeping more. No thoughts of suicide lately. Says she wants to get better.  No perceptual abnormalities. No delusional theme.  We agreed to optimize Bupropion today.   Principal Problem: Bipolar 1 disorder, mixed (HCC) Diagnosis:   Patient Active Problem List   Diagnosis Date Noted  . Rash and nonspecific skin eruption [R21] 07/14/2016  . Tegretol toxicity [T42.1X1A] 07/09/2016  . Electrolyte imbalance [E87.8] 07/09/2016  . Bipolar I disorder, most recent episode depressed (HCC) [F31.30] 06/19/2016  . Bipolar 1 disorder, manic, full remission (HCC) [F31.74] 06/18/2016  . Migraine [G43.909] 02/20/2015  . Affective psychosis, bipolar (HCC) [F31.9]   . Bipolar disorder, current episode depressed, severe, without psychotic features (HCC) [F31.4]   . Bipolar affective disorder, current episode severe (HCC) [F31.9] 12/02/2014  . Bipolar I disorder, current or most recent episode depressed, in partial remission (HCC) [F31.75] 07/11/2014  . Bipolar 1 disorder, mixed (HCC) [F31.60] 06/27/2014  . Disease of thyroid  gland [E07.9] 06/27/2014  . Anxiety [F41.9] 06/27/2014  . 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 Bipolar 1 disorder, depressed, partial remission (HCC) [F31.75] 06/27/2014  . 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 Alcohol use disorder, severe, dependence (HCC) [F10.20] 06/24/2014  . Bipolar disorder, unspecified (HCC) [F31.9] 02/16/2013  . Adrenal insufficiency (HCC) [E27.40]   . Acne [L70.9]   . Hx of Lyme disease [Z86.19]   . 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 Bipolar I disorder, most recent episode (or current) manic (HCC) [F31.10] 08/29/2012  . Hypothyroidism [E03.9] 12/23/2010  . Myalgia [M79.1] 12/23/2010  . Fatigue [R53.83] 12/23/2010  . Unspecified vitamin deficiency [E56.9] 12/23/2010  . Depression [F32.9]    Total Time spent with patient: 30 minutes  Past Psychiatric History: As in H&P  Past Medical History:  Past Medical History:  Diagnosis Date  . Acne   . Adrenal insufficiency (HCC)   . Bipolar 2 disorder (HCC)   . Cellulitis   . Depression   . Genital warts   . Headache   . History of chicken pox   . Hx of Lyme disease   . Hypothyroidism     Past Surgical History:  Procedure Laterality Date  . CESAREAN SECTION  2003, 2007, 2011  . INGUINAL HERNIA REPAIR Bilateral   . TONSILLECTOMY    . UMBILICAL HERNIA REPAIR     Family History:  Family History  Problem Relation Age of Onset  . Other Mother     PAD  . Heart disease Other     Grandparent  . Alcohol abuse Paternal Grandmother   . Mental retardation Paternal Grandmother   .  Arthritis Maternal Grandfather   . Heart disease Maternal Grandfather    Family Psychiatric  History: As in H&P  Social History:  History  Alcohol Use  . Yes    Comment: social drinker-few drinks a week      History  Drug Use No    Social History   Social History  . Marital status: Single    Spouse name: N/A  . Number of  children: N/A  . Years of education: 44   Occupational History  . HOMEMAKER    Social History Main Topics  . Smoking status: Current Every Day Smoker    Packs/day: 1.50    Years: 29.00    Types: Cigarettes  . Smokeless tobacco: Never Used  . Alcohol use Yes     Comment: social drinker-few drinks a week   . Drug use: No  . Sexual activity: Not Currently   Other Topics Concern  . None   Social History Narrative   Regular exercise-no   Additional Social History:        Sleep: Good  Appetite:  Fair  Current Medications: Current Facility-Administered Medications  Medication Dose Route Frequency Provider Last Rate Last Dose  . acetaminophen (TYLENOL) tablet 650 mg  650 mg Oral Q6H PRN Adonis Brook, NP   650 mg at 07/19/16 0858  . alum & mag hydroxide-simeth (MAALOX/MYLANTA) 200-200-20 MG/5ML suspension 30 mL  30 mL Oral Q4H PRN Adonis Brook, NP      . ARIPiprazole (ABILIFY) tablet 5 mg  5 mg Oral QHS Beau Fanny, FNP   5 mg at 07/18/16 2125  . buPROPion (WELLBUTRIN XL) 24 hr tablet 150 mg  150 mg Oral Daily Cobos, Rockey Situ, MD   150 mg at 07/19/16 0856  . feeding supplement (ENSURE ENLIVE) (ENSURE ENLIVE) liquid 237 mL  237 mL Oral BID BM Cobos, Rockey Situ, MD   237 mL at 07/19/16 0858  . hydrOXYzine (ATARAX/VISTARIL) tablet 25 mg  25 mg Oral TID PRN Adonis Brook, NP   25 mg at 07/18/16 1912  . ibuprofen (ADVIL,MOTRIN) tablet 400 mg  400 mg Oral Q6H PRN Adonis Brook, NP   400 mg at 07/16/16 2056  . magnesium hydroxide (MILK OF MAGNESIA) suspension 30 mL  30 mL Oral Daily PRN Adonis Brook, NP      . neomycin-bacitracin-polymyxin (NEOSPORIN) ointment   Topical TID Calvert Cantor, MD      . nicotine (NICODERM CQ - dosed in mg/24 hours) patch 21 mg  21 mg Transdermal Daily Adonis Brook, NP   21 mg at 07/19/16 0857  . thyroid (ARMOUR) tablet 90 mg  90 mg Oral QAC breakfast Cobos, Rockey Situ, MD   90 mg at 07/19/16 0856  . traZODone (DESYREL) tablet 50 mg  50  mg Oral QHS PRN Adonis Brook, NP   50 mg at 07/18/16 2125    Lab Results: No results found for this or any previous visit (from the past 48 hour(s)).  Blood Alcohol level:  Lab Results  Component Value Date   ETH <5 07/08/2016   ETH <5 06/18/2016    Metabolic Disorder Labs: Lab Results  Component Value Date   HGBA1C 5.1 12/03/2014   MPG 100 12/03/2014   MPG 100 12/02/2014   No results found for: PROLACTIN Lab Results  Component Value Date   CHOL 195 12/03/2014   TRIG 122 12/03/2014   HDL 42 12/03/2014   CHOLHDL 4.6 12/03/2014   VLDL 24 12/03/2014   LDLCALC 129 (H)  12/03/2014    Physical Findings: AIMS: Facial and Oral Movements Muscles of Facial Expression: None, normal Lips and Perioral Area: None, normal Jaw: None, normal Tongue: None, normal,Extremity Movements Upper (arms, wrists, hands, fingers): None, normal Lower (legs, knees, ankles, toes): None, normal, Trunk Movements Neck, shoulders, hips: None, normal, Overall Severity Severity of abnormal movements (highest score from questions above): None, normal Incapacitation due to abnormal movements: None, normal Patient's awareness of abnormal movements (rate only patient's report): No Awareness, Dental Status Current problems with teeth and/or dentures?: No Does patient usually wear dentures?: No  CIWA:  CIWA-Ar Total: 1 COWS:  COWS Total Score: 2  Musculoskeletal: Strength & Muscle Tone: within normal limits Gait & Station: normal Patient leans: N/A  Psychiatric Specialty Exam: Physical Exam  Constitutional: She is oriented to person, place, and time. She appears well-developed.  HENT:  Head: Normocephalic and atraumatic.  Eyes: Conjunctivae are normal. Pupils are equal, round, and reactive to light.  Neck: Normal range of motion. Neck supple.  Cardiovascular: Normal rate.   Respiratory: Effort normal and breath sounds normal.  GI: Soft. Bowel sounds are normal.  Musculoskeletal: Normal range of  motion.  Neurological: She is alert and oriented to person, place, and time.  Skin: Skin is warm and dry.  Psychiatric:  As above    ROS  Blood pressure (!) 90/53, pulse 73, temperature 97.9 F (36.6 C), temperature source Oral, resp. rate 16, height 5' (1.524 m), weight 53.1 kg (117 lb), SpO2 98 %.Body mass index is 22.85 kg/m.  General Appearance: Casually dressed, calm and cooperative. Good relatedness. Slightly withdrawn.  Eye Contact:  Good  Speech:  Normal Rate  Volume:  Decreased  Mood:  Depressed  Affect:  Blunted and mood congruent.   Thought Process:  Linear  Orientation:  Full (Time, Place, and Person)  Thought Content:  No delusional theme. No preoccupation with violent thoughts. No negative ruminations. No obsession.  No hallucination in any modality.   Suicidal Thoughts:  No  Homicidal Thoughts:  No  Memory:  Immediate;   Good Recent;   Fair Remote;   Good  Judgement:  Good  Insight:  Good  Psychomotor Activity:  Decreased  Concentration:  WNL  Recall:  Good  Fund of Knowledge:  Good  Language:  Good  Akathisia:  No  Handed:    AIMS (if indicated):     Assets:  Communication Skills Desire for Improvement Housing Physical Health Resilience  ADL's:  Intact  Cognition:  WNL  Sleep:  Number of Hours: 6.75     Treatment Plan Summary: Patient is still depressed. She is not endorsing any suicidal thoughts at this time. She is still potentially at risk from severe depression. We have agreed to increase Bupropion today to 300 mg. We would evaluate her further.   Psychiatric: Bipolar I Disorder. 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 Increase Bupropion to 300 mg daily 2. Continue to monitor mood, behavior and interaction with peers  Georgiann CockerVincent A Gust Eugene, MD 07/19/2016, 2:06 PM

## 2016-07-19 NOTE — [facility] Group Notes (Signed)
[facility] Group Therapy 07/19/2016 1:15pm  Type of Therapy: [facility] [employer] Presentation  Participation Level: Active  Participation Quality: Attentive  Affect: Appropriate  Cognitive: Oriented  Insight: Developing/Improving  Engagement in Therapy: Engaged  Modes of Intervention: Discussion, Education and Socialization  Summary of Progress/Problems: Mental [employer] Association (MHA) Speaker came to talk about his personal journey with substance abuse and addiction. The pt processed ways by which to relate to the speaker. MHA speaker provided handouts and educational information pertaining to groups and services offered by the MHA. Pt was engaged in speaker's presentation and was receptive to resources provided.    Tina [name] B. Uriah Philipson, MSW, LCSWA 07/19/2016 3:00 PM   

## 2016-07-19 NOTE — Progress Notes (Signed)
Oswald Hillock. Gabryela had been up and visible in milieu this evening, did attend and participate in evening group activity, she spoke about feeling better but did endorse some on-going anxiety and depression. [name] was able to receive all bedtime medications without incident this evening. A. Support and encouragement provided. R. Safety maintained, will continue to monitor.

## 2016-07-19 NOTE — Progress Notes (Signed)
D: Patient up and visible in the milieu though quiet, slightly withdrawn in her interactions. Spoke with patient 1:1. Rating depression at an 8/10, hopelessness at an 8/10 and anxiety at a 3/10. Rates sleep as good appetite as fair, energy as low and concentration as good. Patient's affect anxious, flat and depressed with congruent mood. Patient minimizing severity of suicide attempt and is somewhat superficial and nonchalant in her interactions with this Clinical research associatewriter. States goal for today is to "not isolate, hang out with people and go to groups." Complaining of L wrist pain from previous IV (PTA) and L hip pain of unknown origin both rated at a 5/10. No other physical complaints.   A: Medicated per orders, tylenol prn given. Emotional support offered and self inventory reviewed. Encouraged completion of Suicide Safety Plan. Discussed POC with MD, SW.    R: Patient verbalizes understanding of POC. On reassess, patient was asleep. Patient denies SI/HI and remains safe on level III obs.

## 2016-07-19 NOTE — Progress Notes (Signed)
Recreation Therapy Notes  Animal-Assisted Activity (AAA) Program Checklist/Progress Notes Patient Eligibility Criteria Checklist & Daily Group note for Rec TxIntervention  Date: 05.08.2018 Time: 2:45pm Location: [address] Dayroom   AAA/T Program Assumption of Risk Form signed by Patient/ or Parent Legal Guardian Yes  Patient is free of allergies or sever asthma Yes  Patient reports no fear of animals Yes  Patient reports no history of cruelty to animals Yes  Patient understands his/her participation is voluntary Yes  Patient washes hands before animal contact Yes  Patient washes hands after animal contact Yes  Behavioral Response: Engaged, Attentive   Education:Hand Washing, Appropriate Animal Interaction   Education Outcome: Acknowledges education.   Clinical Observations/Feedback: Patient attended session and interacted appropriately with therapy dog and peers.   Tina [name], LRT/CTRS       Tina [name] L 07/19/2016 3:05 PM 

## 2016-07-19 NOTE — Plan of Care (Signed)
Problem: Medication: Goal: Compliance with prescribed medication regimen will improve Outcome: Progressing Patient med compliant.  Problem: Education: Goal: Verbalization of understanding the information provided will improve Outcome: Progressing Patient verbalizes understanding of information, education provided.

## 2016-07-20 DIAGNOSIS — Z810 Family history of intellectual disabilities: Secondary | ICD-10-CM

## 2016-07-20 MED ORDER — BUPROPION HCL ER (XL) 300 MG PO TB24
450.0000 mg | ORAL_TABLET | Freq: Every day | ORAL | Status: DC
  Administered 2016-07-21 – 2016-07-29 (×9): 450 mg via ORAL
  Filled 2016-07-20 (×11): qty 1

## 2016-07-20 NOTE — [facility] Group Notes (Signed)
[facility] LCSW Group Therapy 07/20/2016 1:15 PM  Type of Therapy: Group Therapy- Emotion Regulation  Participation Level: Active   Participation Quality: Minimal  Affect: Appropriate  Cognitive: Alert and Oriented   Insight:  Developing/Improving  Engagement in Therapy: Developing/Improving and Engaged   Modes of Intervention: Clarification, Confrontation, Discussion, Education, Exploration, Limit-setting, Orientation, Problem-solving, Rapport Building, Dance movement psychotherapisteality Testing, Socialization and Support  Summary of Progress/Problems: The topic for group today was emotional regulation. This group focused on both positive and negative emotion identification and allowed group members to process ways to identify feelings, regulate negative emotions, and find healthy ways to manage internal/external emotions. Group members were asked to reflect on a time when their reaction to an emotion led to a negative outcome and explored how alternative responses using emotion regulation would have benefited them. Group members were also asked to discuss a time when emotion regulation was utilized when a negative emotion was experienced. Pt reports that she is having a good day today. Pt thinks that the medications are starting to work for her. Pt did not participate much otherwise in the group discussion but did listen attentively.   Tina [name] Born, MSW, LCSWA 07/20/2016 3:17 PM

## 2016-07-20 NOTE — [facility] Group Notes (Signed)
[facility]   07/20/2016  8:45 AM  Participation Quality:  Pt invited. Did not attend.   Jonathon JordanLynn B Jeronimo Hellberg, MSW, LCSWA 07/20/2016 3:16 PM

## 2016-07-20 NOTE — Progress Notes (Signed)
Adult Psychoeducational Group Note  Date:  07/20/2016 Time:  9:46 PM  Group Topic/Focus:  Wrap-Up Group:   The focus of this group is to help patients review their daily goal of treatment and discuss progress on daily workbooks.  Participation Level:  Minimal  Participation Quality:  Appropriate  Affect:  Appropriate  Cognitive:  Alert  Insight: Appropriate  Engagement in Group:  Engaged  Modes of Intervention:  Discussion  Additional Comments:  Pt rated her day 7/10. Her goal is to get better and be able to function outside of the hospital.  Kaleen OdeaCOOKE, Frandy Basnett R 07/20/2016, 9:46 PM

## 2016-07-20 NOTE — Progress Notes (Signed)
D: Patient up and visible in the milieu after initially slow to wakeup. Spoke with patient 1:1. Rating depression at a 7/10, hopelessness at a 6/10 and anxiety at a 3/10. Rates sleep as good, appetite as good, energy as low and concentration as good. Patient's affect flat, mood depressed and anxious. Patient superficial, continues to minimize prevent suicide attempts. States goal for today is to "stay social and not isolate, stay in dayroom." Reporting L wrist pain of a 6/10, no other physical problems.   A: Medicated per orders, tylenol prn given. Emotional support offered and self inventory reviewed. Encouraged completion of Suicide Safety Plan. Discussed POC with MD, SW.    R: Patient verbalizes understanding of POC. On reassess, patient's resolved. Patient denies SI/HI and remains safe on level III obs.

## 2016-07-20 NOTE — Progress Notes (Signed)
Recreation Therapy Notes  Date: 07/20/16 Time: 0930 Location: [address] Dayroom  Group Topic: Stress Management  Goal Area(s) Addresses:  Patient will verbalize importance of using healthy stress management.  Patient will identify positive emotions associated with healthy stress management.   Intervention: Stress Management  Activity :  Guided Imagery.  LRT introduced the stress management technique of guided imagery.  LRT read [name] script to allow patients to engage in the technique.  Patients were to follow along as the script was read to participate.  Education:  Stress Management, Discharge Planning.   Education Outcome: Acknowledges edcuation/In group clarification offered/Needs additional education  Clinical Observations/Feedback: Pt did not attend group.   Tina [name], LRT/CTRS         Tina [name] 07/20/2016 1:00 PM 

## 2016-07-20 NOTE — Tx Team (Signed)
Interdisciplinary Treatment and Diagnostic Plan Update 07/20/2016 Time of Session: 9:30am  Tina [name]  MRN: [id number]  Principal Diagnosis: Bipolar 1 disorder, mixed (HCC)  Secondary Diagnoses: Principal Problem:   Bipolar 1 disorder, mixed (HCC) Active Problems:   Rash and nonspecific skin eruption   Current Medications:  Current Facility-Administered Medications  Medication Dose Route Frequency Provider Last Rate Last Dose  . acetaminophen (TYLENOL) tablet 650 mg  650 mg Oral Q6H PRN Adonis Brook, NP   650 mg at 07/19/16 1646  . alum & mag hydroxide-simeth (MAALOX/MYLANTA) 200-200-20 MG/5ML suspension 30 mL  30 mL Oral Q4H PRN Adonis Brook, NP      . ARIPiprazole (ABILIFY) tablet 5 mg  5 mg Oral QHS Beau Fanny, FNP   5 mg at 07/19/16 2147  . buPROPion (WELLBUTRIN XL) 24 hr tablet 300 mg  300 mg Oral Daily Izediuno, Vincent A, MD      . feeding supplement (ENSURE ENLIVE) (ENSURE ENLIVE) liquid 237 mL  237 mL Oral BID BM Cobos, Fernando A, MD   237 mL at 07/19/16 1435  . hydrOXYzine (ATARAX/VISTARIL) tablet 25 mg  25 mg Oral TID PRN Adonis Brook, NP   25 mg at 07/18/16 1912  . ibuprofen (ADVIL,MOTRIN) tablet 400 mg  400 mg Oral Q6H PRN Adonis Brook, NP   400 mg at 07/19/16 2146  . magnesium hydroxide (MILK OF MAGNESIA) suspension 30 mL  30 mL Oral Daily PRN Adonis Brook, NP      . neomycin-bacitracin-polymyxin (NEOSPORIN) ointment   Topical TID Calvert Cantor, MD      . nicotine (NICODERM CQ - dosed in mg/24 hours) patch 21 mg  21 mg Transdermal Daily Adonis Brook, NP   21 mg at 07/19/16 0857  . thyroid (ARMOUR) tablet 90 mg  90 mg Oral QAC breakfast Cobos, Rockey Situ, MD   90 mg at 07/20/16 4098  . traZODone (DESYREL) tablet 50 mg  50 mg Oral QHS PRN Adonis Brook, NP   50 mg at 07/19/16 2146    PTA Medications: Prescriptions Prior to Admission  Medication Sig Dispense Refill Last Dose  . lamoTRIgine (LAMICTAL) 25 MG tablet Take 1 tablet (25 mg  total) by mouth 2 (two) times daily. 30 tablet 0   . mupirocin cream (BACTROBAN) 2 % Apply topically 2 (two) times daily. 15 g 0   . thyroid (ARMOUR) 90 MG tablet Take 1 tablet (90 mg total) by mouth daily before breakfast. 30 tablet 0 unk  . venlafaxine XR (EFFEXOR-XR) 75 MG 24 hr capsule Take 1 capsule (75 mg total) by mouth daily with breakfast. 30 capsule 0 unk    Treatment Modalities: Medication Management, Group therapy, Case management,  1 to 1 session with clinician, Psychoeducation, Recreational therapy.  Patient Stressors: Financial difficulties Occupational concerns Patient Strengths: Average or above average intelligence Capable of independent living Supportive family/friends  Physician Treatment Plan for Primary Diagnosis: Bipolar 1 disorder, mixed (HCC) Long Term Goal(s): Improvement in symptoms so as ready for discharge Short Term Goals: Ability to maintain clinical measurements within normal limits will improve Compliance with prescribed medications will improve Ability to identify changes in lifestyle to reduce recurrence of condition will improve Ability to verbalize feelings will improve Ability to disclose and discuss suicidal ideas Ability to demonstrate self-control will improve Ability to identify and develop effective coping behaviors will improve  Medication Management: Evaluate patient's response, side effects, and tolerance of medication regimen.  Therapeutic Interventions: 1 to 1 sessions, Unit Group sessions and  Medication administration.  Evaluation of Outcomes: Progressing  Physician Treatment Plan for Secondary Diagnosis: Principal Problem:   Bipolar 1 disorder, mixed (HCC) Active Problems:   Rash and nonspecific skin eruption  Long Term Goal(s): Improvement in symptoms so as ready for discharge  Short Term Goals: Ability to maintain clinical measurements within normal limits will improve Compliance with prescribed medications will  improve Ability to identify changes in lifestyle to reduce recurrence of condition will improve Ability to verbalize feelings will improve Ability to disclose and discuss suicidal ideas Ability to demonstrate self-control will improve Ability to identify and develop effective coping behaviors will improve  Medication Management: Evaluate patient's response, side effects, and tolerance of medication regimen.  Therapeutic Interventions: 1 to 1 sessions, Unit Group sessions and Medication administration.  Evaluation of Outcomes: Progressing  RN Treatment Plan for Primary Diagnosis: Bipolar 1 disorder, mixed (HCC) Long Term Goal(s): Knowledge of disease and therapeutic regimen to maintain health will improve  Short Term Goals: Ability to remain free from injury will improve and Compliance with prescribed medications will improve  Medication Management: RN will administer medications as ordered by provider, will assess and evaluate patient's response and provide education to patient for prescribed medication. RN will report any adverse and/or side effects to prescribing provider.  Therapeutic Interventions: 1 on 1 counseling sessions, Psychoeducation, Medication administration, Evaluate responses to treatment, Monitor vital signs and CBGs as ordered, Perform/monitor CIWA, COWS, AIMS and Fall Risk screenings as ordered, Perform wound care treatments as ordered.  Evaluation of Outcomes: Progressing  LCSW Treatment Plan for Primary Diagnosis: Bipolar 1 disorder, mixed (HCC) Long Term Goal(s): Safe transition to appropriate next level of care at discharge, Engage patient in therapeutic group addressing interpersonal concerns. Short Term Goals: Engage patient in aftercare planning with referrals and resources, Increase ability to appropriately verbalize feelings, Increase emotional regulation, Identify triggers associated with mental health/substance abuse issues and Increase skills for wellness and  recovery  Therapeutic Interventions: Assess for all discharge needs, 1 to 1 time with Social worker, Explore available resources and support systems, Assess for adequacy in community support network, Educate family and significant other(s) on suicide prevention, Complete Psychosocial Assessment, Interpersonal group therapy.  Evaluation of Outcomes: Progressing  Progress in Treatment: Attending groups: Yes Participating in groups: Yes Taking medication as prescribed: Yes, MD continues to assess for medication changes as needed Toleration medication: Yes, no side effects reported at this time Family/Significant other contact made: Yes, pt's mother contacted. Patient understands diagnosis: Continuing to assess Discussing patient identified problems/goals with staff: Yes Medical problems stabilized or resolved: Yes Denies suicidal/homicidal ideation: No, pt endorses SI. Issues/concerns per patient self-inventory: None Other: N/A  New problem(s) identified: None identified at this time.   New Short Term/Long Term Goal(s): None identified at this time.   Discharge Plan or Barriers: Pt will return home and follow up outpatient with IOP or the [facility].  Reason for Continuation of Hospitalization:  Anxiety  Depression Medication stabilization Suicidal ideation  Estimated Length of Stay: 3-5 days  Attendees: Patient: 07/20/2016 8:34 AM  Physician: Dr. Jama Flavorsobos 07/20/2016 8:34 AM  Nursing: Armando ReichertMarian, RN; [place], RN 07/20/2016 8:34 AM  RN Care Manager: Onnie BoerJennifer Clark, RN 07/20/2016 8:34 AM  Social Worker: Donnelly StagerLynn Trejon Duford, LCSWA 07/20/2016 8:34 AM  Recreational Therapist:  07/20/2016 8:34 AM  Other: Armandina StammerAgnes Nwoko, NP; Gray BernhardtMay Augustin, NP 07/20/2016 8:34 AM  Other:  07/20/2016 8:34 AM  Other: 07/20/2016 8:34 AM   Scribe for Treatment Team: Jonathon JordanLynn B Markey Deady, MSW,LCSWA 07/20/2016 8:34 AM

## 2016-07-20 NOTE — Plan of Care (Signed)
Problem: Education: Goal: Verbalization of understanding the information provided will improve Outcome: Progressing Patient verbalizes understanding of information, education provided.  Problem: Coping: Goal: Ability to demonstrate self-control will improve Outcome: Progressing Patient cooperative, calm on unit.

## 2016-07-20 NOTE — Progress Notes (Signed)
[facility] MD Progress Note  07/20/2016 4:13 PM Tina [name]  MRN:  [id number] Subjective:   46 yo Caucasian female, divorced, lives alone. Background history of Bipolar disorder. Patient was recently discharged from our unit. She presented two weeks after with altered mental status. Family found her unresponsive and called EMS. She had overdosed on Tegretol. patient reports main stressor as financial ruins while she was manic.   chart reviewed today. Patient discussed at team  Staff notes that she is still withdrawn in the mornings. Warms up gradually as the day progresses. She has not voiced any suicidial thoughts. No behavioral issues. She has not been observed to be internally stimulated. She has been tolerating her medications well.  Seen today. Slightly better today. Says she still struggles to get out of bed. Wants to get better soon. No associated psychosis. No evidence of mania. Denies any current suicidal thoughts. Says she still has a lot of financial problems. Explored addition of another antidepressant. I advised we should optimize one class first. Potential risk of switching her into a manic phase with multiple antidepressants.  Principal Problem: Bipolar 1 disorder, mixed (HCC) Diagnosis:   Patient Active Problem List   Diagnosis Date Noted  . Rash and nonspecific skin eruption [R21] 07/14/2016  . Tegretol toxicity [T42.1X1A] 07/09/2016  . Electrolyte imbalance [E87.8] 07/09/2016  . Bipolar I disorder, most recent episode depressed (HCC) [F31.30] 06/19/2016  . Bipolar 1 disorder, manic, full remission (HCC) [F31.74] 06/18/2016  . Migraine [G43.909] 02/20/2015  . Affective psychosis, bipolar (HCC) [F31.9]   . Bipolar disorder, current episode depressed, severe, without psychotic features (HCC) [F31.4]   . Bipolar affective disorder, current episode severe (HCC) [F31.9] 12/02/2014  . Bipolar I disorder, current or most recent episode depressed, in partial remission (HCC)  [F31.75] 07/11/2014  . Bipolar 1 disorder, mixed (HCC) [F31.60] 06/27/2014  . Disease of thyroid gland [E07.9] 06/27/2014  . Anxiety [F41.9] 06/27/2014  . 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 Bipolar 1 disorder, depressed, partial remission (HCC) [F31.75] 06/27/2014  . 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 Alcohol use disorder, severe, dependence (HCC) [F10.20] 06/24/2014  . Bipolar disorder, unspecified (HCC) [F31.9] 02/16/2013  . Adrenal insufficiency (HCC) [E27.40]   . Acne [L70.9]   . Hx of Lyme disease [Z86.19]   . 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 Bipolar I disorder, most recent episode (or current) manic (HCC) [F31.10] 08/29/2012  . Hypothyroidism [E03.9] 12/23/2010  . Myalgia [M79.1] 12/23/2010  . Fatigue [R53.83] 12/23/2010  . Unspecified vitamin deficiency [E56.9] 12/23/2010  . Depression [F32.9]    Total Time spent with patient: 30 minutes  Past Psychiatric History: As in H&P  Past Medical History:  Past Medical History:  Diagnosis Date  . Acne   . Adrenal insufficiency (HCC)   . Bipolar 2 disorder (HCC)   . Cellulitis   . Depression   . Genital warts   . Headache   . History of chicken pox   . Hx of Lyme disease   . Hypothyroidism     Past Surgical History:  Procedure Laterality Date  . CESAREAN SECTION  2003, 2007, 2011  . INGUINAL HERNIA REPAIR Bilateral   . TONSILLECTOMY    . UMBILICAL HERNIA REPAIR     Family History:  Family History  Problem Relation Age of Onset  . Other Mother     PAD  . Heart disease Other     Grandparent  . Alcohol  abuse Paternal Grandmother   . Mental retardation Paternal Grandmother   . Arthritis Maternal Grandfather   . Heart disease Maternal Grandfather    Family Psychiatric  History: As in H&P  Social History:  History  Alcohol Use  . Yes    Comment: social drinker-few drinks a week      History  Drug Use No     Social History   Social History  . Marital status: Single    Spouse name: N/A  . Number of children: N/A  . Years of education: 24   Occupational History  . HOMEMAKER    Social History Main Topics  . Smoking status: Current Every Day Smoker    Packs/day: 1.50    Years: 29.00    Types: Cigarettes  . Smokeless tobacco: Never Used  . Alcohol use Yes     Comment: social drinker-few drinks a week   . Drug use: No  . Sexual activity: Not Currently   Other Topics Concern  . None   Social History Narrative   Regular exercise-no   Additional Social History:        Sleep: Good  Appetite:  Fair  Current Medications: Current Facility-Administered Medications  Medication Dose Route Frequency Provider Last Rate Last Dose  . acetaminophen (TYLENOL) tablet 650 mg  650 mg Oral Q6H PRN Adonis Brook, NP   650 mg at 07/20/16 1203  . alum & mag hydroxide-simeth (MAALOX/MYLANTA) 200-200-20 MG/5ML suspension 30 mL  30 mL Oral Q4H PRN Adonis Brook, NP      . ARIPiprazole (ABILIFY) tablet 5 mg  5 mg Oral QHS Beau Fanny, FNP   5 mg at 07/19/16 2147  . buPROPion (WELLBUTRIN XL) 24 hr tablet 300 mg  300 mg Oral Daily Amario Longmore, Delight Ovens, MD   300 mg at 07/20/16 0852  . feeding supplement (ENSURE ENLIVE) (ENSURE ENLIVE) liquid 237 mL  237 mL Oral BID BM Cobos, Fernando A, MD   237 mL at 07/19/16 1435  . hydrOXYzine (ATARAX/VISTARIL) tablet 25 mg  25 mg Oral TID PRN Adonis Brook, NP   25 mg at 07/18/16 1912  . ibuprofen (ADVIL,MOTRIN) tablet 400 mg  400 mg Oral Q6H PRN Adonis Brook, NP   400 mg at 07/19/16 2146  . magnesium hydroxide (MILK OF MAGNESIA) suspension 30 mL  30 mL Oral Daily PRN Adonis Brook, NP      . neomycin-bacitracin-polymyxin (NEOSPORIN) ointment   Topical TID Calvert Cantor, MD      . nicotine (NICODERM CQ - dosed in mg/24 hours) patch 21 mg  21 mg Transdermal Daily Adonis Brook, NP   21 mg at 07/20/16 0852  . thyroid (ARMOUR) tablet 90 mg  90 mg Oral QAC  breakfast Cobos, Rockey Situ, MD   90 mg at 07/20/16 4098  . traZODone (DESYREL) tablet 50 mg  50 mg Oral QHS PRN Adonis Brook, NP   50 mg at 07/19/16 2146    Lab Results: No results found for this or any previous visit (from the past 48 hour(s)).  Blood Alcohol level:  Lab Results  Component Value Date   [facility] <5 07/08/2016   ETH <5 06/18/2016    Metabolic Disorder Labs: Lab Results  Component Value Date   HGBA1C 5.1 12/03/2014   MPG 100 12/03/2014   MPG 100 12/02/2014   No results found for: PROLACTIN Lab Results  Component Value Date   CHOL 195 12/03/2014   TRIG 122 12/03/2014   HDL 42 12/03/2014  CHOLHDL 4.6 12/03/2014   VLDL 24 12/03/2014   LDLCALC 129 (H) 12/03/2014    Physical Findings: AIMS: Facial and Oral Movements Muscles of Facial Expression: None, normal Lips and Perioral Area: None, normal Jaw: None, normal Tongue: None, normal,Extremity Movements Upper (arms, wrists, hands, fingers): None, normal Lower (legs, knees, ankles, toes): None, normal, Trunk Movements Neck, shoulders, hips: None, normal, Overall Severity Severity of abnormal movements (highest score from questions above): None, normal Incapacitation due to abnormal movements: None, normal Patient's awareness of abnormal movements (rate only patient's report): No Awareness, Dental Status Current problems with teeth and/or dentures?: No Does patient usually wear dentures?: No  CIWA:  CIWA-Ar Total: 1 COWS:  COWS Total Score: 2  Musculoskeletal: Strength & Muscle Tone: within normal limits Gait & Station: normal Patient leans: N/A  Psychiatric Specialty Exam: Physical Exam  Constitutional: She is oriented to person, place, and time. She appears well-developed.  HENT:  Head: Normocephalic and atraumatic.  Eyes: Conjunctivae are normal. Pupils are equal, round, and reactive to light.  Neck: Normal range of motion. Neck supple.  Cardiovascular: Normal rate.   Respiratory: Effort normal  and breath sounds normal.  GI: Soft. Bowel sounds are normal.  Musculoskeletal: Normal range of motion.  Neurological: She is alert and oriented to person, place, and time.  Skin: Skin is warm and dry.  Psychiatric:  As above    ROS  Blood pressure (!) 83/60, pulse 75, temperature 97.5 F (36.4 C), temperature source Oral, resp. rate 16, height 5' (1.524 m), weight 53.1 kg (117 lb), SpO2 98 %.Body mass index is 22.85 kg/m.  General Appearance: Casually dressed, calm and cooperative. Good relatedness. Slightly withdrawn.  Eye Contact:  Good  Speech:  Normal Rate  Volume:  Decreased  Mood:  Depressed  Affect:  Restricted and mood congruent.   Thought Process:  Linear  Orientation:  Full (Time, Place, and Person)  Thought Content:  No delusional theme. No preoccupation with violent thoughts. No negative ruminations. No obsession.  No hallucination in any modality.   Suicidal Thoughts:  No  Homicidal Thoughts:  No  Memory:  Immediate;   Good Recent;   Fair Remote;   Good  Judgement:  Good  Insight:  Good  Psychomotor Activity:  Decreased  Concentration:  WNL  Recall:  Good  Fund of Knowledge:  Good  Language:  Good  Akathisia:  No  Handed:    AIMS (if indicated):     Assets:  Communication Skills Desire for Improvement Housing Physical Health Resilience  ADL's:  Intact  Cognition:  WNL  Sleep:  Number of Hours: 6.75     Treatment Plan Summary: Patient is tolerating recent medication adjustment well. Depression is still pervasive. We have agreed to titrate further from tomorrow.   Psychiatric: Bipolar I Disorder. 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 Increase Bupropion XL to 450 mg daily from tomorrow. 2. Continue to monitor mood, behavior and interaction with peers 3. Encourage unit groups and  activties  Georgiann CockerVincent A Naryiah Schley, MD 07/20/2016, 4:13 PMPatient ID: Tina FoleyLaurie [name], female   DOB: Sep 28, 1970, 46 y.o.   MRN: [id number]

## 2016-07-20 NOTE — Progress Notes (Signed)
Tina [name]. Tina [name] had been up and visible in milieu this evening, did attend and participate in evening group activity. She spoke about her day starting out rough but reports it got better as day went on. Tina [name] still does report feeling depressed and does appear depressed in the milieu. Tina [name] was able to receive bedtime medication without incident and did ask for and receive motrin to address pain in left wrist. A. Support and encouragement provided. R. Safety maintained, will continue to monitor.

## 2016-07-21 NOTE — Progress Notes (Signed)
[facility] Group Notes:  (Nursing/MHT/Case Management/Adjunct)  Date:  07/21/2016  Time:  2030 Type of Therapy:  wrap up group  Participation Level:  Active  Participation Quality:  Appropriate, Attentive, Sharing and Supportive  Affect:  Flat  Cognitive:  Appropriate  Insight:  Improving  Engagement in Group:  Engaged  Modes of Intervention:  Clarification, Education and Support  Summary of Progress/Problems:  Marcille BuffyMcNeil, Cataleah Stites S 07/21/2016, 10:08 PM

## 2016-07-21 NOTE — Plan of Care (Signed)
Problem: Education: Goal: Utilization of techniques to improve thought processes will improve Outcome: Progressing Nurse discussed depression/anxiety/coping skills with patient.    

## 2016-07-21 NOTE — Progress Notes (Signed)
D:  Patient's self inventory sheet, patient sleeps good, sleep medication helpful.  Fair appetite, low energy level, good concentration.  Rated depression and hopeless 8, anxiety 3.  Denied withdrawals.  Denied SI.  Physical problems, pain, hip, pain medication helpful.  Goal is stay social.  Plans to stay in dayroom.  No discharge plans. A:  Medications administered per MD orders.  Emotional support and encouragement given patient. R:  Denied SI and HI, contracts for safety.   Denied A/V hallucinations.  Safety maintained with 15 minute checks.

## 2016-07-21 NOTE — [facility] Group Notes (Signed)
[facility] LCSW Group Therapy 07/21/2016 1:15pm  Type of Therapy: Group Therapy- Balance in Life  Participation Level: Active   Description of the Group:  The topic for group was balance in life. Today's group focused on defining balance in one's own words, identifying things that can knock one off balance, and exploring healthy ways to maintain balance in life. Group members were asked to provide an example of a time when they felt off balance, describe how they handled that situation,and process healthier ways to regain balance in the future. Group members were asked to share the most important tool for maintaining balance that [facility] and how they plan to apply this method after discharge.  Summary of Patient Progress Pt states that she is feeling more balanced today than she has any other day that she has been in the hospital. Pt states that every day she makes it a goal to be social because she knows that being around other people is good for her. Pt states that today she made herself get up and go to breakfast and she felt really good about it. Pt did become tearful when asked how she will maintain this balance once she goes home. Pt states "I don't know. I'm still a mess and I can't even think about how I will function at home right now. I'm better but I'm definitely not where I need to be yet".    Therapeutic Modalities:   Cognitive Behavioral Therapy Solution-Focused Therapy Assertiveness Training   Tina JordanLynn B Westyn [name], MSW, LCSWA 07/21/2016 4:17 PM

## 2016-07-21 NOTE — Progress Notes (Signed)
[facility] MD Progress Note  07/21/2016 1:02 PM Tina [name]  MRN:  [id number]  Subjective: Tina [name] reports, "I'm feel a little better today. The medications are working some, I have not felt the full effects yet".   Tina [name] 46 yo Caucasian female, divorced, lives alone. Background history of Bipolar disorder. Patient was recently discharged from our unit. She presented two weeks after with altered mental status. Family found her unresponsive and called EMS. She had overdosed on Tegretol. patient reports main stressor as financial ruins while she was manic.   Chart reviewed today 07-21-16. Patient discussed at team  Staff notes that she is still withdrawn in the mornings. Warms up gradually as the day progresses. She has not voiced any suicidial thoughts. No behavioral issues. She has not been observed to be internally stimulated. She has been tolerating her medications well.  Seen today. Says she is lightly better. Says she still struggles to get out of bed. Wants to get better soon. No associated psychosis. No evidence of mania. Denies any current suicidal thoughts. Says she still has a lot of financial problems. Explored addition of another antidepressant. I advised we should optimize one class first. Potential risk of switching her into a manic phase with multiple antidepressants.  Principal Problem: Bipolar 1 disorder, mixed (HCC) Diagnosis:   Patient Active Problem List   Diagnosis Date Noted  . Rash and nonspecific skin eruption [R21] 07/14/2016  . Tegretol toxicity [T42.1X1A] 07/09/2016  . Electrolyte imbalance [E87.8] 07/09/2016  . Bipolar I disorder, most recent episode depressed (HCC) [F31.30] 06/19/2016  . Bipolar 1 disorder, manic, full remission (HCC) [F31.74] 06/18/2016  . Migraine [G43.909] 02/20/2015  . Affective psychosis, bipolar (HCC) [F31.9]   . Bipolar disorder, current episode depressed, severe, without psychotic features (HCC) [F31.4]   . Bipolar affective disorder,  current episode severe (HCC) [F31.9] 12/02/2014  . Bipolar I disorder, current or most recent episode depressed, in partial remission (HCC) [F31.75] 07/11/2014  . Bipolar 1 disorder, mixed (HCC) [F31.60] 06/27/2014  . Disease of thyroid gland [E07.9] 06/27/2014  . Anxiety [F41.9] 06/27/2014  . 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 Bipolar 1 disorder, depressed, partial remission (HCC) [F31.75] 06/27/2014  . 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 Alcohol use disorder, severe, dependence (HCC) [F10.20] 06/24/2014  . Bipolar disorder, unspecified (HCC) [F31.9] 02/16/2013  . Adrenal insufficiency (HCC) [E27.40]   . Acne [L70.9]   . Hx of Lyme disease [Z86.19]   . 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 Bipolar I disorder, most recent episode (or current) manic (HCC) [F31.10] 08/29/2012  . Hypothyroidism [E03.9] 12/23/2010  . Myalgia [M79.1] 12/23/2010  . Fatigue [R53.83] 12/23/2010  . Unspecified vitamin deficiency [E56.9] 12/23/2010  . Depression [F32.9]    Total Time spent with patient: 15 minutes  Past Psychiatric History: As in H&P  Past Medical History:  Past Medical History:  Diagnosis Date  . Acne   . Adrenal insufficiency (HCC)   . Bipolar 2 disorder (HCC)   . Cellulitis   . Depression   . Genital warts   . Headache   . History of chicken pox   . Hx of Lyme disease   . Hypothyroidism     Past Surgical History:  Procedure Laterality Date  . CESAREAN SECTION  2003, 2007, 2011  . INGUINAL HERNIA REPAIR Bilateral   . TONSILLECTOMY    . UMBILICAL HERNIA REPAIR     Family History:  Family History  Problem  Relation Age of Onset  . Other Mother        PAD  . Heart disease Other        Grandparent  . Alcohol abuse Paternal Grandmother   . Mental retardation Paternal Grandmother   . Arthritis Maternal Grandfather   . Heart disease Maternal Grandfather    Family Psychiatric   History: As in H&P  Social History:  History  Alcohol Use  . Yes    Comment: social drinker-few drinks a week      History  Drug Use No    Social History   Social History  . Marital status: Single    Spouse name: N/A  . Number of children: N/A  . Years of education: 31   Occupational History  . HOMEMAKER    Social History Main Topics  . Smoking status: Current Every Day Smoker    Packs/day: 1.50    Years: 29.00    Types: Cigarettes  . Smokeless tobacco: Never Used  . Alcohol use Yes     Comment: social drinker-few drinks a week   . Drug use: No  . Sexual activity: Not Currently   Other Topics Concern  . None   Social History Narrative   Regular exercise-no   Additional Social History:  Sleep: Good  Appetite:  Fair  Current Medications: Current Facility-Administered Medications  Medication Dose Route Frequency Provider Last Rate Last Dose  . acetaminophen (TYLENOL) tablet 650 mg  650 mg Oral Q6H PRN Adonis Brook, NP   650 mg at 07/21/16 1148  . alum & mag hydroxide-simeth (MAALOX/MYLANTA) 200-200-20 MG/5ML suspension 30 mL  30 mL Oral Q4H PRN Adonis Brook, NP      . ARIPiprazole (ABILIFY) tablet 5 mg  5 mg Oral QHS Beau Fanny, FNP   5 mg at 07/20/16 2120  . buPROPion (WELLBUTRIN XL) 24 hr tablet 450 mg  450 mg Oral Daily Izediuno, Vincent A, MD   450 mg at 07/21/16 0900  . feeding supplement (ENSURE ENLIVE) (ENSURE ENLIVE) liquid 237 mL  237 mL Oral BID BM Cobos, Fernando A, MD   237 mL at 07/19/16 1435  . hydrOXYzine (ATARAX/VISTARIL) tablet 25 mg  25 mg Oral TID PRN Adonis Brook, NP   25 mg at 07/18/16 1912  . ibuprofen (ADVIL,MOTRIN) tablet 400 mg  400 mg Oral Q6H PRN Adonis Brook, NP   400 mg at 07/19/16 2146  . magnesium hydroxide (MILK OF MAGNESIA) suspension 30 mL  30 mL Oral Daily PRN Adonis Brook, NP      . neomycin-bacitracin-polymyxin (NEOSPORIN) ointment   Topical TID Calvert Cantor, MD   1 application at 07/21/16 0900  . nicotine  (NICODERM CQ - dosed in mg/24 hours) patch 21 mg  21 mg Transdermal Daily Adonis Brook, NP   21 mg at 07/21/16 0900  . thyroid (ARMOUR) tablet 90 mg  90 mg Oral QAC breakfast Cobos, Rockey Situ, MD   90 mg at 07/21/16 0949  . traZODone (DESYREL) tablet 50 mg  50 mg Oral QHS PRN Adonis Brook, NP   50 mg at 07/20/16 2120   Lab Results: No results found for this or any previous visit (from the past 48 hour(s)).  Blood Alcohol level:  Lab Results  Component Value Date   [facility] <5 07/08/2016   ETH <5 06/18/2016    Metabolic Disorder Labs: Lab Results  Component Value Date   HGBA1C 5.1 12/03/2014   MPG 100 12/03/2014   MPG 100 12/02/2014  No results found for: PROLACTIN Lab Results  Component Value Date   CHOL 195 12/03/2014   TRIG 122 12/03/2014   HDL 42 12/03/2014   CHOLHDL 4.6 12/03/2014   VLDL 24 12/03/2014   LDLCALC 129 (H) 12/03/2014   Physical Findings: AIMS: Facial and Oral Movements Muscles of Facial Expression: None, normal Lips and Perioral Area: None, normal Jaw: None, normal Tongue: None, normal,Extremity Movements Upper (arms, wrists, hands, fingers): None, normal Lower (legs, knees, ankles, toes): None, normal, Trunk Movements Neck, shoulders, hips: None, normal, Overall Severity Severity of abnormal movements (highest score from questions above): None, normal Incapacitation due to abnormal movements: None, normal Patient's awareness of abnormal movements (rate only patient's report): No Awareness, Dental Status Current problems with teeth and/or dentures?: No Does patient usually wear dentures?: No  CIWA:  CIWA-Ar Total: 1 COWS:  COWS Total Score: 2  Musculoskeletal: Strength & Muscle Tone: within normal limits Gait & Station: normal Patient leans: N/A  Psychiatric Specialty Exam: Physical Exam  Constitutional: She is oriented to person, place, and time. She appears well-developed.  HENT:  Head: Normocephalic and atraumatic.  Eyes: Conjunctivae  are normal. Pupils are equal, round, and reactive to light.  Neck: Normal range of motion. Neck supple.  Cardiovascular: Normal rate.   Respiratory: Effort normal and breath sounds normal.  GI: Soft. Bowel sounds are normal.  Musculoskeletal: Normal range of motion.  Neurological: She is alert and oriented to person, place, and time.  Skin: Skin is warm and dry.  Psychiatric:  As above    ROS  Blood pressure (!) 99/55, pulse 62, temperature 98.1 F (36.7 C), temperature source Oral, resp. rate 16, height 5' (1.524 m), weight 53.1 kg (117 lb), SpO2 98 %.Body mass index is 22.85 kg/m.  General Appearance: Casually dressed, calm and cooperative. Good relatedness. Slightly withdrawn.  Eye Contact:  Good  Speech:  Normal Rate  Volume:  Decreased  Mood:  Depressed  Affect:  Restricted and mood congruent.   Thought Process:  Linear  Orientation:  Full (Time, Place, and Person)  Thought Content:  No delusional theme. No preoccupation with violent thoughts. No negative ruminations. No obsession.  No hallucination in any modality.   Suicidal Thoughts:  No  Homicidal Thoughts:  No  Memory:  Immediate;   Good Recent;   Fair Remote;   Good  Judgement:  Good  Insight:  Good  Psychomotor Activity:  Decreased  Concentration:  WNL  Recall:  Good  Fund of Knowledge:  Good  Language:  Good  Akathisia:  No  Handed:    AIMS (if indicated):     Assets:  Communication Skills Desire for Improvement Housing Physical Health Resilience  ADL's:  Intact  Cognition:  WNL  Sleep:  Number of Hours: 6.75   Treatment Plan Summary: Patient is tolerating recent medication adjustment well. Depression is still pervasive. We have agreed to titrate further from tomorrow.   Will continue today 07/21/16 plan as below except where it is noted.  Psychiatric: Bipolar I Disorder. Current episode is depression  Medical: Hypothyroidism, continue Thyroid Armour tablet 90 mcg daily. 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    Continue Abilify 5 mg for mood control.    Continue Hydroxyzine 25 mg prn for anxiety.    Continue Trazodone 50 mg for insomnia. 2. Will continue to monitor mood, behavior and interaction  with peers 3. Encourage unit groups and activties  Sanjuana KavaNwoko, Tessa Seaberry I, NP, PMHNP-BC. 07/21/2016, 1:02 PMPatient ID: Tina FoleyLaurie [name], female   DOB: [birth year], 46 y.o.   MRN: [id number]

## 2016-07-22 NOTE — Progress Notes (Signed)
Recreation Therapy Notes  Date: 07/22/16 Time: 0930 Location: 400 Hall Dayroom  Group Topic: Stress Management  Goal Area(s) Addresses:  Patient will verbalize importance of using healthy stress management.  Patient will identify positive emotions associated with healthy stress management.   Intervention: Stress Management  Activity :  Progressive Muscle Relaxation.  LRT introduced the stress management technique of progressive muscle relaxation.  LRT read a script to allow patients to fully participate in the technique.  Patients were to follow along as the script was read to engage in the activity.  Education:  Stress Management, Discharge Planning.   Education Outcome: Acknowledges edcuation/In group clarification offered/Needs additional education  Clinical Observations/Feedback:  Pt did not attend group.    [name], LRT/CTRS         [name] A 07/22/2016 10:51 AM 

## 2016-07-22 NOTE — [facility] Group Notes (Signed)
[facility] Group Notes:  (Nursing/MHT/Case Management/Adjunct)  Date:  07/22/2016  Time:  3:01 PM  Type of Therapy:  Nurse Education  Participation Level:  Did Not Attend  Summary of Progress/Problems:  This group discussed relapse prevention, successful goal setting, and healthy coping skills.   Almira Barenny G Analisse Randle 07/22/2016, 3:01 PM

## 2016-07-22 NOTE — Progress Notes (Addendum)
D Tina [name] is seen at 1200 ...standing at the med window and she says..." I guess I should really take my medication..I dont know what is wrong with me". A She completes her daily assessment and on this she writes  She has not had SI today and she rates her depression, hopelessness and anxiety " 6/6/3", respectively. R Safety in place and pt states " I don't have any idea when they are planning to send me home..I'm just concentrating on today". She is able to verbalize that she realizes she " went home before I was ready last time" and is reluctant to think about discharge at this point. Safety in place and poc cont.

## 2016-07-22 NOTE — [facility] Group Notes (Signed)
[facility] LCSW Group Therapy 07/22/2016 1:15pm  Type of Therapy: Group Therapy- Feelings Around Relapse and Recovery  Participation Level: Pt was present for the duration of the group. Pt did not participate in the discussion but listened attentively the entire time.    Tina JordanLynn B Lashonta [name], MSW, Tina MajorsLCSWA [phone] 07/22/2016 3:37 PM

## 2016-07-22 NOTE — Progress Notes (Signed)
Patient ID: Tina [name], female   DOB: May 19, 1970, 46 y.o.   MRN: [id number]  Pt currently presents with a flat affect and cooperative behavior. Pt reports to Clinical research associatewriter that their goal is to "talk with the doctor tomorrow about discharge." Pt denies any concerns or questions. Limited interaction with peers. Pt reports good sleep with current medication regimen.   Pt provided with medications per providers orders. Pt's labs and vitals were monitored throughout the night. Pt given a 1:1 about emotional and mental status. Pt supported and encouraged to express concerns and questions. Pt educated on medications.  Pt's safety ensured with 15 minute and environmental checks. Pt currently denies SI/HI and A/V hallucinations. Pt verbally agrees to seek staff if SI/HI or A/VH occurs and to consult with staff before acting on any harmful thoughts. Will continue POC.

## 2016-07-22 NOTE — Progress Notes (Signed)
D    Pt reports having a better day and feeling more positive   She interacts appropriately with others and is compliant with treatment   She reports she would like to be started on another antidepressant besides prozac A   Pt was encouraged to talk to her doctor about medications to be started   verbl support given   Medications administered and effectiveness monitored   Q 15 min checks R    Pt safe at present time

## 2016-07-23 MED ORDER — ARIPIPRAZOLE 10 MG PO TABS
10.0000 mg | ORAL_TABLET | Freq: Every day | ORAL | Status: DC
  Administered 2016-07-23 – 2016-07-24 (×2): 10 mg via ORAL
  Filled 2016-07-23 (×4): qty 1

## 2016-07-23 NOTE — [facility] Group Notes (Signed)
[facility] LCSW Group Therapy Note  07/23/2016  and  10:00 AM  Type of Therapy and Topic:  Group Therapy: Avoiding Self-Sabotaging and Enabling Behaviors  Participation Level:  Active  Participation Quality:  Attentive and Sharing  Affect:  Anxious  Cognitive:  Alert and Oriented  Insight:  Developing/Improving  Engagement in Therapy:  Developing/Improving   Therapeutic models used: Cognitive Behavioral Therapy,  Person-Centered Therapy and Motivational Interviewing  Modes of Intervention:  Discussion, Exploration, Orientation, Rapport Building, Socialization and Support   Summary of Progress/Problems:  The main focus of today's process group was for the patient to identify ways in which they have in the past sabotaged their own recovery. Motivational Interviewing was utilized to identify motivation they may have for wanting to change. Patient identified with isolation and negative self talk as patterns that get in the way of her mental health improving. [facility].    Carney Bernatherine C Harrill, LCSW

## 2016-07-23 NOTE — [facility] Group Notes (Signed)
Adult Psychoeducational Group Note  Date:  07/23/2016 Time:  1:34 AM  Group Topic/Focus:  Wrap-Up Group:   The focus of this group is to help patients review their daily goal of treatment and discuss progress on daily workbooks.  Participation Level:  Active  Participation Quality:  Appropriate  Affect:  Anxious  Cognitive:  Appropriate  Insight: Appropriate  Engagement in Group:  Engaged  Modes of Intervention:  Problem-solving and Rapport Building  Additional Comments:  Pt stated her day was a 7. Pt stated that she has not isolated herself as much during the day and is getting better at learning how to be social and deal with her anxiety. Pt states that she becomes fearful when she is out in the world, but feels safe here.   Jamas LavBailey, Lyle Niblett W 07/23/2016, 1:34 AM

## 2016-07-23 NOTE — Progress Notes (Signed)
[facility]  07/23/2016 4:58 PM Tina FoleyLaurie [name]  MRN:  [id number] Subjective:   46 yo Caucasian female, divorced, lives alone. Background history of Bipolar disorder. Patient was recently discharged from our unit. She presented two weeks after with altered mental status. Family found her unresponsive and called EMS. She had overdosed on Tegretol. patient reports main stressor as financial ruins while she was manic.   chart reviewed today. Patient discussed at team  Staff reports that she has been participating at groups. She has not voiced any futility thoughts. She hopes to go home soon. She is tolerating her medications well. She has not been observed to be internally stimulated. Has been sleeping well. Normal appetite.  Seen today. Some benefit with Bupropion. Still feels a bit down. No recent suicidal thoughts. No side effects from her medication. Wants to get better. No evidence of psychosis. No evidence of mania. We agreed to optimize Aripiprazole.   Principal Problem: Bipolar 1 disorder, mixed (HCC) Diagnosis:   Patient Active Problem List   Diagnosis Date Noted  . Rash and nonspecific skin eruption [R21] 07/14/2016  . Tegretol toxicity [T42.1X1A] 07/09/2016  . Electrolyte imbalance [E87.8] 07/09/2016  . Bipolar I disorder, most recent episode depressed (HCC) [F31.30] 06/19/2016  . Bipolar 1 disorder, manic, full remission (HCC) [F31.74] 06/18/2016  . Migraine [G43.909] 02/20/2015  . Affective psychosis, bipolar (HCC) [F31.9]   . Bipolar disorder, current episode depressed, severe, without psychotic features (HCC) [F31.4]   . Bipolar affective disorder, current episode severe (HCC) [F31.9] 12/02/2014  . Bipolar I disorder, current or most recent episode depressed, in partial remission (HCC) [F31.75] 07/11/2014  . Bipolar 1 disorder, mixed (HCC) [F31.60] 06/27/2014  . Disease of thyroid gland [E07.9] 06/27/2014  . Anxiety [F41.9] 06/27/2014  . Opiate misuse [F11.90]  06/27/2014  . 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 Bipolar 1 disorder, depressed, partial remission (HCC) [F31.75] 06/27/2014  . 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 Alcohol use disorder, severe, dependence (HCC) [F10.20] 06/24/2014  . Bipolar disorder, unspecified (HCC) [F31.9] 02/16/2013  . Adrenal insufficiency (HCC) [E27.40]   . Acne [L70.9]   . Hx of Lyme disease [Z86.19]   . 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 Bipolar I disorder, most recent episode (or current) manic (HCC) [F31.10] 08/29/2012  . Hypothyroidism [E03.9] 12/23/2010  . Myalgia [M79.1] 12/23/2010  . Fatigue [R53.83] 12/23/2010  . Unspecified vitamin deficiency [E56.9] 12/23/2010  . Depression [F32.9]    Total Time spent with patient: 30 minutes  Past Psychiatric History: As in H&P  Past Medical History:  Past Medical History:  Diagnosis Date  . Acne   . Adrenal insufficiency (HCC)   . Bipolar 2 disorder (HCC)   . Cellulitis   . Depression   . Genital warts   . Headache   . History of chicken pox   . Hx of Lyme disease   . Hypothyroidism     Past Surgical History:  Procedure Laterality Date  . CESAREAN SECTION  2003, 2007, 2011  . INGUINAL HERNIA REPAIR Bilateral   . TONSILLECTOMY    . UMBILICAL HERNIA REPAIR     Family History:  Family History  Problem Relation Age of Onset  . Other Mother        PAD  . Heart disease Other        Grandparent  . Alcohol abuse Paternal Grandmother   . Mental retardation Paternal Grandmother   . Arthritis Maternal Grandfather   . Heart  disease Maternal Grandfather    Family Psychiatric  History: As in H&P  Social History:  History  Alcohol Use  . Yes    Comment: social drinker-few drinks a week      History  Drug Use No    Social History   Social History  . Marital status: Single    Spouse name: N/A  . Number of children: N/A  . Years of education: 32   Occupational History  .  HOMEMAKER    Social History Main Topics  . Smoking status: Current Every Day Smoker    Packs/day: 1.50    Years: 29.00    Types: Cigarettes  . Smokeless tobacco: Never Used  . Alcohol use Yes     Comment: social drinker-few drinks a week   . Drug use: No  . Sexual activity: Not Currently   Other Topics Concern  . None   Social History Narrative   Regular exercise-no   Additional Social History:        Sleep: Good  Appetite:  Fair  Current Medications: Current Facility-Administered Medications  Medication Dose Route Frequency Provider Last Rate Last Dose  . acetaminophen (TYLENOL) tablet 650 mg  650 mg Oral Q6H PRN Adonis Brook, NP   650 mg at 07/23/16 0954  . alum & mag hydroxide-simeth (MAALOX/MYLANTA) 200-200-20 MG/5ML suspension 30 mL  30 mL Oral Q4H PRN Adonis Brook, NP      . ARIPiprazole (ABILIFY) tablet 5 mg  5 mg Oral QHS Beau Fanny, FNP   5 mg at 07/22/16 2133  . buPROPion (WELLBUTRIN XL) 24 hr tablet 450 mg  450 mg Oral Daily Khylei Wilms, Delight Ovens, MD   450 mg at 07/23/16 0952  . feeding supplement (ENSURE ENLIVE) (ENSURE ENLIVE) liquid 237 mL  237 mL Oral BID BM Cobos, Fernando A, MD   237 mL at 07/19/16 1435  . hydrOXYzine (ATARAX/VISTARIL) tablet 25 mg  25 mg Oral TID PRN Adonis Brook, NP   25 mg at 07/18/16 1912  . ibuprofen (ADVIL,MOTRIN) tablet 400 mg  400 mg Oral Q6H PRN Adonis Brook, NP   400 mg at 07/19/16 2146  . magnesium hydroxide (MILK OF MAGNESIA) suspension 30 mL  30 mL Oral Daily PRN Adonis Brook, NP   30 mL at 07/23/16 1406  . neomycin-bacitracin-polymyxin (NEOSPORIN) ointment   Topical TID Calvert Cantor, MD   1 application at 07/21/16 0900  . nicotine (NICODERM CQ - dosed in mg/24 hours) patch 21 mg  21 mg Transdermal Daily Adonis Brook, NP   21 mg at 07/23/16 0957  . thyroid (ARMOUR) tablet 90 mg  90 mg Oral QAC breakfast Cobos, Rockey Situ, MD   90 mg at 07/23/16 0629  . traZODone (DESYREL) tablet 50 mg  50 mg Oral QHS PRN  Adonis Brook, NP   50 mg at 07/22/16 2134    Lab Results: No results found for this or any previous visit (from the past 48 hour(s)).  Blood Alcohol level:  Lab Results  Component Value Date   ETH <5 07/08/2016   ETH <5 06/18/2016    Metabolic Disorder Labs: Lab Results  Component Value Date   HGBA1C 5.1 12/03/2014   MPG 100 12/03/2014   MPG 100 12/02/2014   No results found for: PROLACTIN Lab Results  Component Value Date   CHOL 195 12/03/2014   TRIG 122 12/03/2014   HDL 42 12/03/2014   CHOLHDL 4.6 12/03/2014   VLDL 24 12/03/2014   LDLCALC 129 (H) 12/03/2014  Physical Findings: AIMS: Facial and Oral Movements Muscles of Facial Expression: None, normal Lips and Perioral Area: None, normal Jaw: None, normal Tongue: None, normal,Extremity Movements Upper (arms, wrists, hands, fingers): None, normal Lower (legs, knees, ankles, toes): None, normal, Trunk Movements Neck, shoulders, hips: None, normal, Overall Severity Severity of abnormal movements (highest score from questions above): None, normal Incapacitation due to abnormal movements: None, normal Patient's awareness of abnormal movements (rate only patient's report): No Awareness, Dental Status Current problems with teeth and/or dentures?: No Does patient usually wear dentures?: No  CIWA:  CIWA-Ar Total: 1 COWS:  COWS Total Score: 1  Musculoskeletal: Strength & Muscle Tone: within normal limits Gait & Station: normal Patient leans: N/A  Psychiatric Specialty Exam: Physical Exam  Constitutional: She is oriented to person, place, and time. She appears well-developed.  HENT:  Head: Normocephalic and atraumatic.  Eyes: Conjunctivae are normal. Pupils are equal, round, and reactive to light.  Neck: Normal range of motion. Neck supple.  Cardiovascular: Normal rate.   Respiratory: Effort normal and breath sounds normal.  GI: Soft. Bowel sounds are normal.  Musculoskeletal: Normal range of motion.   Neurological: She is alert and oriented to person, place, and time.  Skin: Skin is warm and dry.  Psychiatric:  As above    ROS  Blood pressure (!) 87/60, pulse 70, temperature 98.8 F (37.1 C), temperature source Oral, resp. rate 16, height 5' (1.524 m), weight 53.1 kg (117 lb), SpO2 98 %.Body mass index is 22.85 kg/m.  General Appearance: Casually dressed, calm and cooperative. Good relatedness.   Eye Contact:  Good  Speech:  Normal Rate  Volume:  Normal  Mood:  Less depressed  Affect:  Restricted but able to mobilize some positive affect.   Thought Process:  Linear  Orientation:  Full (Time, Place, and Person)  Thought Content:  No delusional theme. No preoccupation with violent thoughts. No negative ruminations. No obsession.  No hallucination in any modality.   Suicidal Thoughts:  No  Homicidal Thoughts:  No  Memory:  WNL  Judgement:  Good  Insight:  Good  Psychomotor Activity:  WNL  Concentration:  WNL  Recall:  Good  Fund of Knowledge:  Good  Language:  Good  Akathisia:  No  Handed:    AIMS (if indicated):     Assets:  Communication Skills Desire for Improvement Housing Physical Health Resilience  ADL's:  Intact  Cognition:  WNL  Sleep:  Number of Hours: 6.25     Treatment Plan Summary: Depression is gradually lifting. We plan to adjust her medications further as below  Psychiatric: Bipolar I Disorder. 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 Increase Abilify to 10 mg HS 2. Continue to monitor mood, behavior and interaction with peers 3. Encourage unit groups and activties  Georgiann Cocker, MD 07/23/2016, 4:58 PMPatient ID: Tina [name], female   DOB: 1970-07-20, 46 y.o.   MRN: [id number] Patient ID: Tina [name], female   DOB: 01-06-1971, 46 y.o.   MRN: [id number]

## 2016-07-23 NOTE — Progress Notes (Signed)
D [name] remains quiet, flat and anxious. She has come out of her room more. She is more engaged in her treatment today and is able to verbalize how anxious she is about her impending discharge. A She completes her daily assessment and on ti she wrote she deneid SI today and she rated her depression, hopelessness and anxiety " 6/6/3", respectively. R Safety in place.

## 2016-07-24 NOTE — Progress Notes (Signed)
[facility] MD Progress Note  07/24/2016 8:41 AM Tina [name]  MRN:  [id number]   Subjective:  Patient reports " I am alright" she then became tearful and said " It's mother's day and I miss my children.  Objective: Tina [name] is awake, alert and oriented*3. Seen sitting in dayroom, interacting with peers.  Denies suicidal or homicidal ideation. Denies auditory or visual hallucination and does not appear to be responding to internal stimuli. Patient reports a recent medication adjustment. Reports she is tolerating medications well. Reports she is still depressed, however knows that her children  are taken care of by her ex-husband. Support, encouragement and reassurance was provided.   Principal Problem: Bipolar 1 disorder, mixed (HCC) Diagnosis:   Patient Active Problem List   Diagnosis Date Noted  . Rash and nonspecific skin eruption [R21] 07/14/2016  . Tegretol toxicity [T42.1X1A] 07/09/2016  . Electrolyte imbalance [E87.8] 07/09/2016  . Bipolar I disorder, most recent episode depressed (HCC) [F31.30] 06/19/2016  . Bipolar 1 disorder, manic, full remission (HCC) [F31.74] 06/18/2016  . Migraine [G43.909] 02/20/2015  . Affective psychosis, bipolar (HCC) [F31.9]   . Bipolar disorder, current episode depressed, severe, without psychotic features (HCC) [F31.4]   . Bipolar affective disorder, current episode severe (HCC) [F31.9] 12/02/2014  . Bipolar I disorder, current or most recent episode depressed, in partial remission (HCC) [F31.75] 07/11/2014  . Bipolar 1 disorder, mixed (HCC) [F31.60] 06/27/2014  . Disease of thyroid gland [E07.9] 06/27/2014  . Anxiety [F41.9] 06/27/2014  . 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 Bipolar 1 disorder, depressed, partial remission (HCC) [F31.75] 06/27/2014  . 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 Alcohol use disorder, severe, dependence (HCC) [F10.20]  06/24/2014  . Bipolar disorder, unspecified (HCC) [F31.9] 02/16/2013  . Adrenal insufficiency (HCC) [E27.40]   . Acne [L70.9]   . Hx of Lyme disease [Z86.19]   . 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 Bipolar I disorder, most recent episode (or current) manic (HCC) [F31.10] 08/29/2012  . Hypothyroidism [E03.9] 12/23/2010  . Myalgia [M79.1] 12/23/2010  . Fatigue [R53.83] 12/23/2010  . Unspecified vitamin deficiency [E56.9] 12/23/2010  . Depression [F32.9]    Total Time spent with patient: 30 minutes  Past Psychiatric History: As in H&P  Past Medical History:  Past Medical History:  Diagnosis Date  . Acne   . Adrenal insufficiency (HCC)   . Bipolar 2 disorder (HCC)   . Cellulitis   . Depression   . Genital warts   . Headache   . History of chicken pox   . Hx of Lyme disease   . Hypothyroidism     Past Surgical History:  Procedure Laterality Date  . CESAREAN SECTION  2003, 2007, 2011  . INGUINAL HERNIA REPAIR Bilateral   . TONSILLECTOMY    . UMBILICAL HERNIA REPAIR     Family History:  Family History  Problem Relation Age of Onset  . Other Mother        PAD  . Heart disease Other        Grandparent  . Alcohol abuse Paternal Grandmother   . Mental retardation Paternal Grandmother   . Arthritis Maternal Grandfather   . Heart disease Maternal Grandfather    Family Psychiatric  History: As in H&P  Social History:  History  Alcohol Use  . Yes    Comment: social drinker-few drinks a week      History  Drug Use No    Social  History   Social History  . Marital status: Single    Spouse name: N/A  . Number of children: N/A  . Years of education: 7816   Occupational History  . HOMEMAKER    Social History Main Topics  . Smoking status: Current Every Day Smoker    Packs/day: 1.50    Years: 29.00    Types: Cigarettes  . Smokeless tobacco: Never Used  . Alcohol use Yes     Comment: social drinker-few drinks a week   . Drug use: No  . Sexual activity: Not  Currently   Other Topics Concern  . None   Social History Narrative   Regular exercise-no   Additional Social History:        Sleep: Good  Appetite:  Fair  Current Medications: Current Facility-Administered Medications  Medication Dose Route Frequency Provider Last Rate Last Dose  . acetaminophen (TYLENOL) tablet 650 mg  650 mg Oral Q6H PRN Adonis BrookAgustin, Sheila, NP   650 mg at 07/23/16 0954  . alum & mag hydroxide-simeth (MAALOX/MYLANTA) 200-200-20 MG/5ML suspension 30 mL  30 mL Oral Q4H PRN Adonis BrookAgustin, Sheila, NP      . ARIPiprazole (ABILIFY) tablet 10 mg  10 mg Oral QHS Izediuno, Delight OvensVincent A, MD   10 mg at 07/23/16 2107  . buPROPion (WELLBUTRIN XL) 24 hr tablet 450 mg  450 mg Oral Daily Izediuno, Delight OvensVincent A, MD   450 mg at 07/24/16 [id number]  . feeding supplement (ENSURE ENLIVE) (ENSURE ENLIVE) liquid 237 mL  237 mL Oral BID BM Cobos, Fernando A, MD   237 mL at 07/19/16 1435  . hydrOXYzine (ATARAX/VISTARIL) tablet 25 mg  25 mg Oral TID PRN Adonis BrookAgustin, Sheila, NP   25 mg at 07/23/16 1821  . ibuprofen (ADVIL,MOTRIN) tablet 400 mg  400 mg Oral Q6H PRN Adonis BrookAgustin, Sheila, NP   400 mg at 07/19/16 2146  . magnesium hydroxide (MILK OF MAGNESIA) suspension 30 mL  30 mL Oral Daily PRN Adonis BrookAgustin, Sheila, NP   30 mL at 07/23/16 1406  . neomycin-bacitracin-polymyxin (NEOSPORIN) ointment   Topical TID Calvert Cantorizwan, Saima, MD   1 application at 07/21/16 0900  . nicotine (NICODERM CQ - dosed in mg/24 hours) patch 21 mg  21 mg Transdermal Daily Adonis BrookAgustin, Sheila, NP   21 mg at 07/24/16 [id number]  . thyroid (ARMOUR) tablet 90 mg  90 mg Oral QAC breakfast Cobos, Rockey SituFernando A, MD   90 mg at 07/24/16 [id number]  . traZODone (DESYREL) tablet 50 mg  50 mg Oral QHS PRN Adonis BrookAgustin, Sheila, NP   50 mg at 07/23/16 2107    Lab Results: No results found for this or any previous visit (from the past 48 hour(s)).  Blood Alcohol level:  Lab Results  Component Value Date   ETH <5 07/08/2016   ETH <5 06/18/2016    Metabolic Disorder Labs: Lab Results   Component Value Date   HGBA1C 5.1 12/03/2014   MPG 100 12/03/2014   MPG 100 12/02/2014   No results found for: PROLACTIN Lab Results  Component Value Date   CHOL 195 12/03/2014   TRIG 122 12/03/2014   HDL 42 12/03/2014   CHOLHDL 4.6 12/03/2014   VLDL 24 12/03/2014   LDLCALC 129 (H) 12/03/2014    Physical Findings: AIMS: Facial and Oral Movements Muscles of Facial Expression: None, normal Lips and Perioral Area: None, normal Jaw: None, normal Tongue: None, normal,Extremity Movements Upper (arms, wrists, hands, fingers): None, normal Lower (legs, knees, ankles, toes): None, normal, Trunk Movements Neck, shoulders,  hips: None, normal, Overall Severity Severity of abnormal movements (highest score from questions above): None, normal Incapacitation due to abnormal movements: None, normal Patient's awareness of abnormal movements (rate only patient's report): No Awareness, Dental Status Current problems with teeth and/or dentures?: No Does patient usually wear dentures?: No  CIWA:  CIWA-Ar Total: 1 COWS:  COWS Total Score: 1  Musculoskeletal: Strength & Muscle Tone: within normal limits Gait & Station: normal Patient leans: N/A  Psychiatric Specialty Exam: Physical Exam  Constitutional: She is oriented to person, place, and time. She appears well-developed.  HENT:  Head: Normocephalic and atraumatic.  Eyes: Conjunctivae are normal. Pupils are equal, round, and reactive to light.  Neck: Normal range of motion. Neck supple.  Cardiovascular: Normal rate.   Respiratory: Effort normal and breath sounds normal.  GI: Soft. Bowel sounds are normal.  Musculoskeletal: Normal range of motion.  Neurological: She is alert and oriented to person, place, and time.  Skin: Skin is warm and dry.  Psychiatric: She has a normal mood and affect. Her behavior is normal.  As above    Review of Systems  Psychiatric/Behavioral: Positive for depression. The patient is nervous/anxious and has  insomnia (improving).     Blood pressure (!) 87/60, pulse 70, temperature 98.8 F (37.1 C), temperature source Oral, resp. rate 16, height 5' (1.524 m), weight 53.1 kg (117 lb), SpO2 98 %.Body mass index is 22.85 kg/m.  General Appearance: Casual, flat affect  Eye Contact:  Good  Speech:  Normal Rate  Volume:  Normal  Mood:   Depressed and guarded  Affect:  Restricted but able to mobilize some positive affect.   Thought Process:  Linear  Orientation:  Full (Time, Place, and Person)  Thought Content:   Suicidal Thoughts:  No  Homicidal Thoughts:  No  Memory:  WNL  Judgement:  Good  Insight:  Good  Psychomotor Activity:  WNL  Concentration:  WNL  Recall:  Good  Fund of Knowledge:  Good  Language:  Good  Akathisia:  No  Handed:    AIMS (if indicated):     Assets:  Desire for Improvement Housing Physical Health Resilience  ADL's:  Intact  Cognition:  WNL  Sleep:  Number of Hours: 6.75     I agree with current treatment plan on 07/24/2016, Patient seen face-to-face for psychiatric evaluation follow-up, chart reviewed. Reviewed the information documented and agree with the treatment plan.  Treatment Plan Summary: Depression is gradually lifting. We plan to adjust her medications further as below  Continue with current treatment plan on 07/24/2016 except where  noted  Psychiatric: Bipolar I Disorder. 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 Continue to monitor mood, behavior and interaction with peers 3. Encourage unit groups and activties  Oneta Rack, NP 07/24/2016, 8:41 AM

## 2016-07-24 NOTE — Progress Notes (Signed)
D [name] is up first thing this morning, she goes to breakfast in the cafe' and then she stayed [email or web address]  The dayroom ( awake) the remainder of the morning. She took her daily mediciitons as scheduled and then she attended her CSW group as planned. A She is not as lethargic and tired today and she contributes this to " getting better today". She does complete her daily assessment and on this she writes she deneis SI today and she rated her depression, hopelessness and anxeity " 6/6/3", respectively. She confides in Clinical research associatewriter " I'm really nervous about going home...and I don't like how this abilify is making me feel..I think I went home too soon last time" R Writer spoke with pt at length about adverse side effects of aibilfy and suggested Clinical research associatewriter let MD know so he and pt could speak abut it. Safety in place.

## 2016-07-24 NOTE — [facility] Group Notes (Signed)
[facility] LCSW Group Therapy  07/24/2016  10 AM  Type of Therapy:  Group Therapy  Participation Level:  Did Not Attend; invited to participate yet did not despite overhead announcement and encouragement by staff   Summary of Progress/Problems: Topic for today was thoughts and feelings regarding discharge. We discussed fears of upcoming changes including judgements, expectations and stigma of mental health issues. We then discussed supports: what constitutes a supportive framework, identification of supports and what to do when others are not supportive. Patients processed their greatest challenges  Catherine C Harrill, LCSW    

## 2016-07-24 NOTE — Progress Notes (Signed)
Patient has been up in the dayroom watching tv with minimal interaction.She attended group this evening and reports that she just want to get better. She was informed of her medications scheduled. Patient currently denies having pain, -si/hi/a/v hall. Support and encouragement offered, safety maintained on unit, will continue to monitor.

## 2016-07-24 NOTE — Progress Notes (Signed)
Patient attended group and said that her day was a 7.  Her coping skills were coloring, playing cards and socializing.

## 2016-07-25 MED ORDER — SERTRALINE HCL 25 MG PO TABS
25.0000 mg | ORAL_TABLET | Freq: Every day | ORAL | Status: DC
  Administered 2016-07-25 – 2016-07-27 (×3): 25 mg via ORAL
  Filled 2016-07-25 (×5): qty 1

## 2016-07-25 MED ORDER — ARIPIPRAZOLE 5 MG PO TABS
7.0000 mg | ORAL_TABLET | ORAL | Status: DC
  Administered 2016-07-26 – 2016-07-29 (×4): 7 mg via ORAL
  Filled 2016-07-25 (×7): qty 1

## 2016-07-25 NOTE — [facility] Group Notes (Signed)
[facility] Group Notes:  (Nursing/MHT/Case Management/Adjunct)  Date:  07/25/2016  Time:  12:05 AM  Type of Therapy:  Psychoeducational Skills  Participation Level:  minimal  Participation Quality:  Attentive  Affect:  Flat  Cognitive:  Appropriate  Insight:  Appropriate  Engagement in Group:  Engaged  Modes of Intervention:  Discussion   Summary of Progress/Problems: Pt stated her day was a 6/ 10 slow and dragging.   Jacques Navyhillips, Orlen Leedy A 07/25/2016, 12:05 AM

## 2016-07-25 NOTE — Progress Notes (Signed)
[facility] MD Progress Note  07/25/2016 2:30 PM Tina [name]  MRN:  [id number]   Subjective:  Patient reports partial improvement . She continues to feel depressed often, and ruminates about not being with her children and her financial difficulties often. She states, however, that she is feeling better than she did prior to admission, less hopeless, less overwhelmed. Suicidal ideations have become less frequent and are currently passive- denies any suicidal plan or intention and contracts for safety on unit. Denies medication side effects.   Objective:  I have discussed case with treatment team and have met with patient. Patient presents depressed, sad, but with a more reactive affect than on admission. Smiles at times appropriately . Denies active suicidal ideations, but still ruminates about stressors, with intermittent passive thoughts of " dying ". Contracts for safety on unit. Denies medication side effects. No disruptive or agitated behaviors on unit. Better able to discuss disposition planning options, for example today we reviewed benefits of living with her mother after discharge ( for added support and to minimize social isolation ) rather than returning to her home ( lives alone, reports sense of loneliness worsens at home )   Principal Problem: Bipolar 1 disorder, mixed ([place]) Diagnosis:   Patient Active Problem List   Diagnosis Date Noted  . Rash and nonspecific skin eruption [R21] 07/14/2016  . Tegretol toxicity [T42.1X1A] 07/09/2016  . Electrolyte imbalance [E87.8] 07/09/2016  . Bipolar I disorder, most recent episode depressed ([place]) [F31.30] 06/19/2016  . Bipolar 1 disorder, manic, full remission ([place]) [F31.74] 06/18/2016  . Migraine [G43.909] 02/20/2015  . Affective psychosis, bipolar ([place]) [F31.9]   . Bipolar disorder, current episode depressed, severe, without psychotic features ([place]) [F31.4]   . Bipolar affective disorder, current episode severe ([place]) [F31.9] 12/02/2014   . Bipolar I disorder, current or most recent episode depressed, in partial remission ([place]) [F31.75] 07/11/2014  . Bipolar 1 disorder, mixed ([place]) [F31.60] 06/27/2014  . Disease of thyroid gland [E07.9] 06/27/2014  . Anxiety [F41.9] 06/27/2014  . 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 Cannabis dependence without physiological dependence ([place]) [F12.20] 06/27/2014  . Bipolar 1 disorder, depressed, partial remission ([place]) [F31.75] 06/27/2014  . 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 Alcohol use disorder, severe, dependence ([place]) [F10.20] 06/24/2014  . Bipolar disorder, unspecified ([place]) [F31.9] 02/16/2013  . Adrenal insufficiency ([place]) [E27.40]   . Acne [L70.9]   . Hx of Lyme disease [Z86.19]   . 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 Bipolar I disorder, most recent episode (or current) manic ([place]) [F31.10] 08/29/2012  . Hypothyroidism [E03.9] 12/23/2010  . Myalgia [M79.1] 12/23/2010  . Fatigue [R53.83] 12/23/2010  . Unspecified vitamin deficiency [E56.9] 12/23/2010  . Depression [F32.9]    Total Time spent with patient: 20 minutes  Past Psychiatric History: As in H&P  Past Medical History:  Past Medical History:  Diagnosis Date  . Acne   . Adrenal insufficiency ([place])   . Bipolar 2 disorder ([place])   . Cellulitis   . Depression   . Genital warts   . Headache   . History of chicken pox   . Hx of Lyme disease   . Hypothyroidism     Past Surgical History:  Procedure Laterality Date  . CESAREAN SECTION  2003, 2007, 2011  . INGUINAL HERNIA REPAIR Bilateral   . TONSILLECTOMY    . UMBILICAL HERNIA REPAIR     Family History:  Family History  Problem Relation Age of Onset  . Other Mother  PAD  . Heart disease Other        Grandparent  . Alcohol abuse Paternal Grandmother   . Mental retardation Paternal Grandmother   . Arthritis Maternal Grandfather   . Heart disease Maternal Grandfather    Family Psychiatric  History: As in H&P  Social History:  History   Alcohol Use  . Yes    Comment: social drinker-few drinks a week      History  Drug Use No    Social History   Social History  . Marital status: Single    Spouse name: N/A  . Number of children: N/A  . Years of education: 52   Occupational History  . HOMEMAKER    Social History Main Topics  . Smoking status: Current Every Day Smoker    Packs/day: 1.50    Years: 29.00    Types: Cigarettes  . Smokeless tobacco: Never Used  . Alcohol use Yes     Comment: social drinker-few drinks a week   . Drug use: No  . Sexual activity: Not Currently   Other Topics Concern  . None   Social History Narrative   Regular exercise-no   Additional Social History:        Sleep: Good  Appetite:  Fair  Current Medications: Current Facility-Administered Medications  Medication Dose Route Frequency Provider Last Rate Last Dose  . acetaminophen (TYLENOL) tablet 650 mg  650 mg Oral Q6H PRN Kerrie Buffalo, NP   650 mg at 07/24/16 1311  . alum & mag hydroxide-simeth (MAALOX/MYLANTA) 200-200-20 MG/5ML suspension 30 mL  30 mL Oral Q4H PRN Kerrie Buffalo, NP      . ARIPiprazole (ABILIFY) tablet 7 mg  7 mg Oral BH-q7a ,  A, MD      . buPROPion (WELLBUTRIN XL) 24 hr tablet 450 mg  450 mg Oral Daily Izediuno, Laruth Bouchard, MD   450 mg at 07/25/16 0834  . feeding supplement (ENSURE ENLIVE) (ENSURE ENLIVE) liquid 237 mL  237 mL Oral BID BM ,  A, MD   237 mL at 07/25/16 1427  . hydrOXYzine (ATARAX/VISTARIL) tablet 25 mg  25 mg Oral TID PRN Kerrie Buffalo, NP   25 mg at 07/25/16 1427  . ibuprofen (ADVIL,MOTRIN) tablet 400 mg  400 mg Oral Q6H PRN Kerrie Buffalo, NP   400 mg at 07/19/16 2146  . magnesium hydroxide (MILK OF MAGNESIA) suspension 30 mL  30 mL Oral Daily PRN Kerrie Buffalo, NP   30 mL at 07/23/16 1406  . neomycin-bacitracin-polymyxin (NEOSPORIN) ointment   Topical TID Debbe Odea, MD      . nicotine (NICODERM CQ - dosed in mg/24 hours) patch 21 mg  21 mg  Transdermal Daily Kerrie Buffalo, NP   21 mg at 07/24/16 3832  . sertraline (ZOLOFT) tablet 25 mg  25 mg Oral Daily , Myer Peer, MD   25 mg at 07/25/16 1209  . thyroid (ARMOUR) tablet 90 mg  90 mg Oral QAC breakfast , Myer Peer, MD   90 mg at 07/25/16 0834  . traZODone (DESYREL) tablet 50 mg  50 mg Oral QHS PRN Kerrie Buffalo, NP   50 mg at 07/24/16 2143    Lab Results: No results found for this or any previous visit (from the past 71 hour(s)).  Blood Alcohol level:  Lab Results  Component Value Date   [facility] <5 07/08/2016   ETH <5 91/91/6606    Metabolic Disorder Labs: Lab Results  Component Value Date   HGBA1C 5.1 12/03/2014  MPG 100 12/03/2014   MPG 100 12/02/2014   No results found for: PROLACTIN Lab Results  Component Value Date   CHOL 195 12/03/2014   TRIG 122 12/03/2014   HDL 42 12/03/2014   CHOLHDL 4.6 12/03/2014   VLDL 24 12/03/2014   LDLCALC 129 (H) 12/03/2014    Physical Findings: AIMS: Facial and Oral Movements Muscles of Facial Expression: None, normal Lips and Perioral Area: None, normal Jaw: None, normal Tongue: None, normal,Extremity Movements Upper (arms, wrists, hands, fingers): None, normal Lower (legs, knees, ankles, toes): None, normal, Trunk Movements Neck, shoulders, hips: None, normal, Overall Severity Severity of abnormal movements (highest score from questions above): None, normal Incapacitation due to abnormal movements: None, normal Patient's awareness of abnormal movements (rate only patient's report): No Awareness, Dental Status Current problems with teeth and/or dentures?: No Does patient usually wear dentures?: No  CIWA:  CIWA-Ar Total: 1 COWS:  COWS Total Score: 1  Musculoskeletal: Strength & Muscle Tone: within normal limits Gait & Station: normal Patient leans: N/A  Psychiatric Specialty Exam: Physical Exam  Constitutional: She is oriented to person, place, and time. She appears well-developed.  HENT:  Head:  Normocephalic and atraumatic.  Eyes: Conjunctivae are normal. Pupils are equal, round, and reactive to light.  Neck: Normal range of motion. Neck supple.  Cardiovascular: Normal rate.   Respiratory: Effort normal and breath sounds normal.  GI: Soft. Bowel sounds are normal.  Musculoskeletal: Normal range of motion.  Neurological: She is alert and oriented to person, place, and time.  Skin: Skin is warm and dry.  Psychiatric: She has a normal mood and affect. Her behavior is normal.  As above    Review of Systems  Psychiatric/Behavioral: Positive for depression. The patient is nervous/anxious and has insomnia (improving).   denies chest pain, no shortness of breath, lightheadedness has improved   Blood pressure 94/64, pulse 79, temperature 98.6 F (37 C), temperature source Oral, resp. rate 16, height 5' (1.524 m), weight 53.1 kg (117 lb), SpO2 98 %.Body mass index is 22.85 kg/m.  General Appearance: fairly groomed   Eye Contact:  Good  Speech:  Normal Rate  Volume:  Soft   Mood: remains depressed, states she is feeling a little better  Affect:  Constricted, does smile briefly at times   Thought Process:  Linear and Descriptions of Associations: Intact  Orientation:  Other:  fully alert and attentive   Thought Content: no hallucinations , no delusions, not internally preoccupied   Suicidal Thoughts:  some passive SI, but denies plan or intention and contracts for safety on unit   Homicidal Thoughts:  No  Memory:  Recent and remote grossly intact   Judgement:  Other:  improving   Insight:  improving   Psychomotor Activity:  Decreased   Concentration:  Good   Recall:  Good  Fund of Knowledge:  Good  Language:  Good  Akathisia:  Negative  Handed:    AIMS (if indicated):     Assets:  Communication Skills Desire for Improvement Resilience  ADL's:  Intact  Cognition:  WNL  Sleep:  Number of Hours: 6.75   Assessment - patient presents with partial improvement- still  significantly depressed, sad, ruminative, but affect more reactive, and denying any current active SI, becoming more future oriented . Tolerating medications well at this time, but feels Abilify has been titrated too quickly , contiributing to some lightheadedness, wants to continue Abilify trial, but titrate more slowly . Of note, patient states she thinks she  responded well to Zoloft in the past, at the time stopped it due to concern she was gaining weight, although it was helping. Expresses interest in a Zoloft trial   Treatment Plan Summary: Reviewed as below today 5/14.    PLAN: Encourage increased group and milieu participation to work on coping skills and symptom reduction Decrease Abilify to 7 mg QHS- see above. For mood disorder  Continue Wellbutrin XL 450 mgrs QDAY for depression Start Zoloft 25 mgrs QDAY for depression, anxiety Continue Vistaril 25 mgrs Q 6 hours PRN for anxiety  Continue Trazodone 50 mgrs QHS PRN for insomnia  Treatment team working on disposition planning options   Jenne Campus, MD 07/25/2016, 2:30 PM Patient ID: Napoleon Form, female   DOB: 1970/09/25, 46 y.o.   MRN: [id number]

## 2016-07-25 NOTE — Progress Notes (Signed)
Writer spoke with patient 1:1 and she reports that her day was not as good as she thought it was going to be. She reports that since her Abilify has been increased that she has been feeling irritable and anxious during the day. She reports that she was not experiencing these feelings upon admission. Writer encouraged her to talk with her doctor about this issue. She has been up in the dayroom, attended group and took medications ordered. Safety maintained on unit with 15 min checks.

## 2016-07-25 NOTE — Progress Notes (Addendum)
D: Pt presents with flat affect and depressed mood. Pt appears withdrawn and have minimal interaction on the unit this morning. Pt hypotensive this am. Writer encouraged pt to increase fluid intake. Pt stated that her blood pressure is always low in the am. B/p  Reassessed and MD made aware. Pt stated that she would like her Abilify to be decreased due to feeling anxious, lightheaded and foggy when taking med. MD made aware. Med adjusted. Pt refused to take decreased dose this morning and stated that she will start dose in the morning due to feeling lightheaded. Pt rates anxiety 3/10. Depression 6/10. Pt denies suicidal thoughts and verbally contracts for safety.  A: Medications reviewed with pt. Medications administered as ordered per MD. 1:1 support provided. Pt encouraged to attend groups as tolerable. 15 minute checks performed for safety.  R: Pt compliant with tx. Pt verbalized understanding of med regimen.

## 2016-07-25 NOTE — Progress Notes (Signed)
Patient ID: Tina [name], female   DOB: Feb 27, 1971, 46 y.o.   MRN: [id number] D: Client is visible on the unit, seen in dayroom watching TV. Client reports of her day "okay" anxiety "3", depression "4" of 10. "needed to get back on my medication, get balanced" today's goal: "talk about my discharge, he want me to stay with my mom" A: Writer provided emotional support, encouraged client to follow through with treatment team about discharge plans. Medications reviewed, administered as ordered. Staff will monitor q3115min for safety. R: Client is safe on the unit, attended group.

## 2016-07-25 NOTE — Tx Team (Signed)
Interdisciplinary Treatment and Diagnostic Plan Update 07/25/2016 Time of Session: 9:30am  Tina [name]  MRN: [id number]  Principal Diagnosis: Bipolar 1 disorder, mixed (HCC)  Secondary Diagnoses: Principal Problem:   Bipolar 1 disorder, mixed (HCC) Active Problems:   Rash and nonspecific skin eruption   Current Medications:  Current Facility-Administered Medications  Medication Dose Route Frequency Provider Last Rate Last Dose  . acetaminophen (TYLENOL) tablet 650 mg  650 mg Oral Q6H PRN Adonis Brook, NP   650 mg at 07/24/16 1311  . alum & mag hydroxide-simeth (MAALOX/MYLANTA) 200-200-20 MG/5ML suspension 30 mL  30 mL Oral Q4H PRN Adonis Brook, NP      . ARIPiprazole (ABILIFY) tablet 7 mg  7 mg Oral BH-q7a Cobos, Fernando A, MD      . buPROPion (WELLBUTRIN XL) 24 hr tablet 450 mg  450 mg Oral Daily Izediuno, Delight Ovens, MD   450 mg at 07/25/16 0834  . feeding supplement (ENSURE ENLIVE) (ENSURE ENLIVE) liquid 237 mL  237 mL Oral BID BM Cobos, Fernando A, MD   237 mL at 07/19/16 1435  . hydrOXYzine (ATARAX/VISTARIL) tablet 25 mg  25 mg Oral TID PRN Adonis Brook, NP   25 mg at 07/24/16 1356  . ibuprofen (ADVIL,MOTRIN) tablet 400 mg  400 mg Oral Q6H PRN Adonis Brook, NP   400 mg at 07/19/16 2146  . magnesium hydroxide (MILK OF MAGNESIA) suspension 30 mL  30 mL Oral Daily PRN Adonis Brook, NP   30 mL at 07/23/16 1406  . neomycin-bacitracin-polymyxin (NEOSPORIN) ointment   Topical TID Calvert Cantor, MD      . nicotine (NICODERM CQ - dosed in mg/24 hours) patch 21 mg  21 mg Transdermal Daily Adonis Brook, NP   21 mg at 07/24/16 4098  . sertraline (ZOLOFT) tablet 25 mg  25 mg Oral Daily Cobos, Fernando A, MD      . thyroid (ARMOUR) tablet 90 mg  90 mg Oral QAC breakfast Cobos, Rockey Situ, MD   90 mg at 07/25/16 0834  . traZODone (DESYREL) tablet 50 mg  50 mg Oral QHS PRN Adonis Brook, NP   50 mg at 07/24/16 2143    PTA Medications: Prescriptions Prior to  Admission  Medication Sig Dispense Refill Last Dose  . lamoTRIgine (LAMICTAL) 25 MG tablet Take 1 tablet (25 mg total) by mouth 2 (two) times daily. 30 tablet 0   . mupirocin cream (BACTROBAN) 2 % Apply topically 2 (two) times daily. 15 g 0   . thyroid (ARMOUR) 90 MG tablet Take 1 tablet (90 mg total) by mouth daily before breakfast. 30 tablet 0 unk  . venlafaxine XR (EFFEXOR-XR) 75 MG 24 hr capsule Take 1 capsule (75 mg total) by mouth daily with breakfast. 30 capsule 0 unk    Treatment Modalities: Medication Management, Group therapy, Case management,  1 to 1 session with clinician, Psychoeducation, Recreational therapy.  Patient Stressors: Financial difficulties Occupational concerns Patient Strengths: Average or above average intelligence Capable of independent living Supportive family/friends  Physician Treatment Plan for Primary Diagnosis: Bipolar 1 disorder, mixed (HCC) Long Term Goal(s): Improvement in symptoms so as ready for discharge Short Term Goals: Ability to maintain clinical measurements within normal limits will improve Compliance with prescribed medications will improve Ability to identify changes in lifestyle to reduce recurrence of condition will improve Ability to verbalize feelings will improve Ability to disclose and discuss suicidal ideas Ability to demonstrate self-control will improve Ability to identify and develop effective coping behaviors will improve  Medication Management: Evaluate patient's response, side effects, and tolerance of medication regimen.  Therapeutic Interventions: 1 to 1 sessions, Unit Group sessions and Medication administration.  Evaluation of Outcomes: Progressing  Physician Treatment Plan for Secondary Diagnosis: Principal Problem:   Bipolar 1 disorder, mixed (HCC) Active Problems:   Rash and nonspecific skin eruption  Long Term Goal(s): Improvement in symptoms so as ready for discharge  Short Term Goals: Ability to maintain  clinical measurements within normal limits will improve Compliance with prescribed medications will improve Ability to identify changes in lifestyle to reduce recurrence of condition will improve Ability to verbalize feelings will improve Ability to disclose and discuss suicidal ideas Ability to demonstrate self-control will improve Ability to identify and develop effective coping behaviors will improve  Medication Management: Evaluate patient's response, side effects, and tolerance of medication regimen.  Therapeutic Interventions: 1 to 1 sessions, Unit Group sessions and Medication administration.  Evaluation of Outcomes: Progressing  RN Treatment Plan for Primary Diagnosis: Bipolar 1 disorder, mixed (HCC) Long Term Goal(s): Knowledge of disease and therapeutic regimen to maintain health will improve  Short Term Goals: Ability to remain free from injury will improve and Compliance with prescribed medications will improve  Medication Management: RN will administer medications as ordered by provider, will assess and evaluate patient's response and provide education to patient for prescribed medication. RN will report any adverse and/or side effects to prescribing provider.  Therapeutic Interventions: 1 on 1 counseling sessions, Psychoeducation, Medication administration, Evaluate responses to treatment, Monitor vital signs and CBGs as ordered, Perform/monitor CIWA, COWS, AIMS and Fall Risk screenings as ordered, Perform wound care treatments as ordered.  Evaluation of Outcomes: Progressing  LCSW Treatment Plan for Primary Diagnosis: Bipolar 1 disorder, mixed (HCC) Long Term Goal(s): Safe transition to appropriate next level of care at discharge, Engage patient in therapeutic group addressing interpersonal concerns. Short Term Goals: Engage patient in aftercare planning with referrals and resources, Increase ability to appropriately verbalize feelings, Increase emotional regulation,  Identify triggers associated with mental health/substance abuse issues and Increase skills for wellness and recovery  Therapeutic Interventions: Assess for all discharge needs, 1 to 1 time with Social worker, Explore available resources and support systems, Assess for adequacy in community support network, Educate family and significant other(s) on suicide prevention, Complete Psychosocial Assessment, Interpersonal group therapy.  Evaluation of Outcomes: Progressing  Progress in Treatment: Attending groups: Yes Participating in groups: Yes Taking medication as prescribed: Yes, MD continues to assess for medication changes as needed Toleration medication: Yes, no side effects reported at this time Family/Significant other contact made: Yes, pt's mother contacted. Patient understands diagnosis: Developing insight Discussing patient identified problems/goals with staff: Yes Medical problems stabilized or resolved: Yes Denies suicidal/homicidal ideation: No, pt endorses passive SI. Issues/concerns per patient self-inventory: None Other: N/A  New problem(s) identified: None identified at this time.   New Short Term/Long Term Goal(s): None identified at this time.   Discharge Plan or Barriers: Pt will return home and follow up outpatient with IOP or the [facility].  Reason for Continuation of Hospitalization:  Anxiety  Depression Medication stabilization Suicidal ideation  Estimated Length of Stay: 2-5 days  Attendees: Patient: 07/25/2016 11:09 AM  Physician: Dr. Jama Flavorsobos 07/25/2016 11:09 AM  Nursing: Rodell PernaPatrice, RN; [place] AmericaBeverly, RN 07/25/2016 11:09 AM  RN Care Manager: Onnie BoerJennifer Clark, RN 07/25/2016 11:09 AM  Social Worker: Donnelly StagerLynn Tabithia Stroder, LCSWA 07/25/2016 11:09 AM  Recreational Therapist:  07/25/2016 11:09 AM  Other: Armandina StammerAgnes Nwoko, NP; Gray BernhardtMay Augustin, NP 07/25/2016 11:09 AM  Other:  07/25/2016 11:09 AM  Other: 07/25/2016 11:09 AM   Scribe for Treatment Team: Jonathon Jordan,  MSW,LCSWA 07/25/2016 11:09 AM

## 2016-07-25 NOTE — [facility] Group Notes (Signed)
[facility] LCSW Group Therapy  07/25/2016 1:15pm  Type of Therapy: Group Therapy   Topic: Overcoming Obstacles  Participation Level: Active  Participation Quality: Appropriate   Affect: Appropriate  Cognitive: Appropriate and Oriented  Insight: Developing/Improving and Improving  Engagement in Therapy: Improving  Modes of Intervention: Discussion, Exploration, Problem-solving and Support  Description of Group:  In this group patients will be encouraged to explore what they see as obstacles to their own wellness and recovery. They will be guided to discuss their thoughts, feelings, and behaviors related to these obstacles. The group will process together ways to cope with barriers, with attention given to specific choices patients can make. Each patient will be challenged to identify changes they are motivated to make in order to overcome their obstacles. This group will be process-oriented, with patients participating in exploration of their own experiences as well as giving and receiving support and challenge from other group members.  Summary of Patient Progress:  Pt states her biggest obstacle currently is her depression. Pt states that her depression gets in the way pf her finding a job and also gets in the way of her spending time with her children. Pt reports that she has a lot of stressors that she has to deal with outside of the hospital and thinking about returning to those stressors is very overwhelming to her.  Therapeutic Modalities:  Cognitive Behavioral Therapy Solution Focused Therapy Motivational Interviewing Relapse Prevention Therapy  Jonathon JordanLynn B Latrice Storlie, MSW, Theresia MajorsLCSWA [phone]

## 2016-07-25 NOTE — Progress Notes (Signed)
Recreation Therapy Notes  Date: 07/25/16 Time: 0930 Location: [address] Dayroom  Group Topic: Stress Management  Goal Area(s) Addresses:  Patient will verbalize importance of using healthy stress management.  Patient will identify positive emotions associated with healthy stress management.   Intervention: Stress Management  Activity :  Guided Imagery.  LRT introduced the stress management technique of guided imagery.  LRT read a script to allow patients to engage in the technique.  Patients were to follow along as LRT read the script to fully participate in the technique.  Education:  Stress Management, Discharge Planning.   Education Outcome: Acknowledges edcuation/In group clarification offered/Needs additional education  Clinical Observations/Feedback: Pt did not attend group.    Audyn Dimercurio, LRT/CTRS         Lonny Eisen A 07/25/2016 12:35 PM 

## 2016-07-26 NOTE — Progress Notes (Signed)
[facility] Group Notes:  (Nursing/MHT/Case Management/Adjunct)  Date:  07/26/2016  Time:  12:19 AM  Type of Therapy:  Psychoeducational Skills  Participation Level:  Active  Participation Quality:  Appropriate  Affect:  Appropriate  Cognitive:  Appropriate  Insight:  Appropriate  Engagement in Group:  Engaged  Modes of Intervention:  Education  Summary of Progress/Problems: The patient shared with the group that she celebrated her birthday with her peers and that she had a good talk with her doctor. In addition, she mentioned that the visit with her mother went well. In terms of the theme for the day, her wellness strategy will be to make better food choices.   Alaina Donati S 07/26/2016, 12:19 AM

## 2016-07-26 NOTE — [facility] Group Notes (Signed)
[facility] LCSW Group Therapy 07/26/2016 1:15 PM  Type of Therapy: Group Therapy- Feelings about Diagnosis  Participation Level: Active   Participation Quality:  Appropriate  Affect:  Appropriate  Cognitive: Alert and Oriented   Insight:  Developing   Engagement in Therapy: Developing/Improving and Engaged   Modes of Intervention: Clarification, Confrontation, Discussion, Education, Exploration, Limit-setting, Orientation, Problem-solving, Rapport Building, Dance movement psychotherapisteality Testing, Socialization and Support  Description of Group:   This group will allow patients to explore their thoughts and feelings about diagnoses they have received. Patients will be guided to explore their level of understanding and acceptance of these diagnoses. Facilitator will encourage patients to process their thoughts and feelings about the reactions of others to their diagnosis, and will guide patients in identifying ways to discuss their diagnosis with significant others in their lives. This group will be process-oriented, with patients participating in exploration of their own experiences as well as giving and receiving support and challenge from other group members.  Summary of Progress/Problems:  Pt reports that she feels her diagnosis is accurate and that she knows this because she lives it every day. Pt states that she has gotten negative feedback from others about her mental health concerns. Pt explained that her sister struggles with depression and often compares herself to the pt. Pt states that this is frustrating because their diagnoses aren't the same and her sister does not truly understand what the pt is going through.  Therapeutic Modalities:   Cognitive Behavioral Therapy Solution Focused Therapy Motivational Interviewing Relapse Prevention Therapy  Jonathon JordanLynn B Shahidah Nesbitt, MSW, [facility] CenterCSWA 07/26/2016 4:24 PM

## 2016-07-26 NOTE — Progress Notes (Signed)
Adult Psychoeducational Group Note  Date:  07/26/2016 Time:  9:12 PM  Group Topic/Focus:  Wrap-Up Group:   The focus of this group is to help patients review their daily goal of treatment and discuss progress on daily workbooks.  Participation Level:  Active  Participation Quality:  Appropriate and Attentive  Affect:  Appropriate  Cognitive:  Appropriate  Insight: Appropriate  Engagement in Group:  Engaged  Modes of Intervention:  Discussion  Additional Comments:  Pt stated her day was a 7. Pt is working on discharge plans.  Caswell CorwinOwen, Kiarra Kidd C 07/26/2016, 9:12 PM

## 2016-07-26 NOTE — Progress Notes (Signed)
[facility] MD Progress Note  07/26/2016 8:28 AM Tina [name]  MRN:  [id number]   Subjective:  She is feeling  " better " than on admission, and less frequently /intensely overwhelmed than she did. She does continue to feel depressed. She denies lingering or ongoing suicidal ideations. She also presents somewhat more hopeful, stating that she thinks Zoloft will help.  She had been planning on returning home( lives alone ) after discharge, but states she has decided to go live with her mother for a period of time after discharge to minimize loneliness and have more family support. Denies medication side effects- often feels " lightheaded", mostly in AM, but is unsure if this is medication related. She was in the process of being worked up for adrenal insufficiency prior to admission, but had not seen endocrinologist yet .   Objective:  I have discussed case with treatment team and have met with patient. Patient presents with partial improvement of mood and affect compared to admission, although does continue to present with a constricted affect. Smiles at times appropriately, is no longer tearful, and is more future oriented. Denies suicidal ideations. No disruptive or agitated behaviors on unit, going to some groups .  Denies medication side effects.   Principal Problem: Bipolar 1 disorder, mixed ([place]) Diagnosis:   Patient Active Problem List   Diagnosis Date Noted  . Rash and nonspecific skin eruption [R21] 07/14/2016  . Tegretol toxicity [T42.1X1A] 07/09/2016  . Electrolyte imbalance [E87.8] 07/09/2016  . Bipolar I disorder, most recent episode depressed ([place]) [F31.30] 06/19/2016  . Bipolar 1 disorder, manic, full remission ([place]) [F31.74] 06/18/2016  . Migraine [G43.909] 02/20/2015  . Affective psychosis, bipolar ([place]) [F31.9]   . Bipolar disorder, current episode depressed, severe, without psychotic features ([place]) [F31.4]   . Bipolar affective disorder, current episode severe ([place])  [F31.9] 12/02/2014  . Bipolar I disorder, current or most recent episode depressed, in partial remission ([place]) [F31.75] 07/11/2014  . Bipolar 1 disorder, mixed ([place]) [F31.60] 06/27/2014  . Disease of thyroid gland [E07.9] 06/27/2014  . Anxiety [F41.9] 06/27/2014  . 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 Cannabis dependence without physiological dependence ([place]) [F12.20] 06/27/2014  . Bipolar 1 disorder, depressed, partial remission ([place]) [F31.75] 06/27/2014  . 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 Alcohol use disorder, severe, dependence ([place]) [F10.20] 06/24/2014  . Bipolar disorder, unspecified ([place]) [F31.9] 02/16/2013  . Adrenal insufficiency ([place]) [E27.40]   . Acne [L70.9]   . Hx of Lyme disease [Z86.19]   . 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 Bipolar I disorder, most recent episode (or current) manic ([place]) [F31.10] 08/29/2012  . Hypothyroidism [E03.9] 12/23/2010  . Myalgia [M79.1] 12/23/2010  . Fatigue [R53.83] 12/23/2010  . Unspecified vitamin deficiency [E56.9] 12/23/2010  . Depression [F32.9]    Total Time spent with patient: 20 minutes  Past Psychiatric History: As in H&P  Past Medical History:  Past Medical History:  Diagnosis Date  . Acne   . Adrenal insufficiency ([place])   . Bipolar 2 disorder ([place])   . Cellulitis   . Depression   . Genital warts   . Headache   . History of chicken pox   . Hx of Lyme disease   . Hypothyroidism     Past Surgical History:  Procedure Laterality Date  . CESAREAN SECTION  2003, 2007, 2011  . INGUINAL HERNIA REPAIR Bilateral   . TONSILLECTOMY    . UMBILICAL HERNIA REPAIR     Family History:  Family History  Problem  Relation Age of Onset  . Other Mother        PAD  . Heart disease Other        Grandparent  . Alcohol abuse Paternal Grandmother   . Mental retardation Paternal Grandmother   . Arthritis Maternal Grandfather   . Heart disease Maternal Grandfather    Family Psychiatric  History: As in H&P  Social  History:  History  Alcohol Use  . Yes    Comment: social drinker-few drinks a week      History  Drug Use No    Social History   Social History  . Marital status: Single    Spouse name: N/A  . Number of children: N/A  . Years of education: 84   Occupational History  . HOMEMAKER    Social History Main Topics  . Smoking status: Current Every Day Smoker    Packs/day: 1.50    Years: 29.00    Types: Cigarettes  . Smokeless tobacco: Never Used  . Alcohol use Yes     Comment: social drinker-few drinks a week   . Drug use: No  . Sexual activity: Not Currently   Other Topics Concern  . None   Social History Narrative   Regular exercise-no   Additional Social History:        Sleep: Good  Appetite:  Fair  Current Medications: Current Facility-Administered Medications  Medication Dose Route Frequency Provider Last Rate Last Dose  . acetaminophen (TYLENOL) tablet 650 mg  650 mg Oral Q6H PRN Kerrie Buffalo, NP   650 mg at 07/24/16 1311  . alum & mag hydroxide-simeth (MAALOX/MYLANTA) 200-200-20 MG/5ML suspension 30 mL  30 mL Oral Q4H PRN Kerrie Buffalo, NP      . ARIPiprazole (ABILIFY) tablet 7 mg  7 mg Oral BH-q7a Cobos, Myer Peer, MD   7 mg at 07/26/16 8366  . buPROPion (WELLBUTRIN XL) 24 hr tablet 450 mg  450 mg Oral Daily Izediuno, Laruth Bouchard, MD   450 mg at 07/25/16 0834  . feeding supplement (ENSURE ENLIVE) (ENSURE ENLIVE) liquid 237 mL  237 mL Oral BID BM Cobos, Fernando A, MD   237 mL at 07/25/16 1427  . hydrOXYzine (ATARAX/VISTARIL) tablet 25 mg  25 mg Oral TID PRN Kerrie Buffalo, NP   25 mg at 07/25/16 1427  . ibuprofen (ADVIL,MOTRIN) tablet 400 mg  400 mg Oral Q6H PRN Kerrie Buffalo, NP   400 mg at 07/19/16 2146  . magnesium hydroxide (MILK OF MAGNESIA) suspension 30 mL  30 mL Oral Daily PRN Kerrie Buffalo, NP   30 mL at 07/23/16 1406  . neomycin-bacitracin-polymyxin (NEOSPORIN) ointment   Topical TID Debbe Odea, MD      . nicotine (NICODERM CQ - dosed in  mg/24 hours) patch 21 mg  21 mg Transdermal Daily Kerrie Buffalo, NP   21 mg at 07/24/16 2947  . sertraline (ZOLOFT) tablet 25 mg  25 mg Oral Daily Cobos, Myer Peer, MD   25 mg at 07/25/16 1209  . thyroid (ARMOUR) tablet 90 mg  90 mg Oral QAC breakfast Cobos, Myer Peer, MD   90 mg at 07/26/16 6546  . traZODone (DESYREL) tablet 50 mg  50 mg Oral QHS PRN Kerrie Buffalo, NP   50 mg at 07/25/16 2138    Lab Results: No results found for this or any previous visit (from the past 48 hour(s)).  Blood Alcohol level:  Lab Results  Component Value Date   [facility] <5 07/08/2016   ETH <5  58/85/0277    Metabolic Disorder Labs: Lab Results  Component Value Date   HGBA1C 5.1 12/03/2014   MPG 100 12/03/2014   MPG 100 12/02/2014   No results found for: PROLACTIN Lab Results  Component Value Date   CHOL 195 12/03/2014   TRIG 122 12/03/2014   HDL 42 12/03/2014   CHOLHDL 4.6 12/03/2014   VLDL 24 12/03/2014   LDLCALC 129 (H) 12/03/2014    Physical Findings: AIMS: Facial and Oral Movements Muscles of Facial Expression: None, normal Lips and Perioral Area: None, normal Jaw: None, normal Tongue: None, normal,Extremity Movements Upper (arms, wrists, hands, fingers): None, normal Lower (legs, knees, ankles, toes): None, normal, Trunk Movements Neck, shoulders, hips: None, normal, Overall Severity Severity of abnormal movements (highest score from questions above): None, normal Incapacitation due to abnormal movements: None, normal Patient's awareness of abnormal movements (rate only patient's report): No Awareness, Dental Status Current problems with teeth and/or dentures?: No Does patient usually wear dentures?: No  CIWA:  CIWA-Ar Total: 1 COWS:  COWS Total Score: 1  Musculoskeletal: Strength & Muscle Tone: within normal limits Gait & Station: normal Patient leans: N/A  Psychiatric Specialty Exam: Physical Exam  Constitutional: She is oriented to person, place, and time. She appears  well-developed.  HENT:  Head: Normocephalic and atraumatic.  Eyes: Conjunctivae are normal. Pupils are equal, round, and reactive to light.  Neck: Normal range of motion. Neck supple.  Cardiovascular: Normal rate.   Respiratory: Effort normal and breath sounds normal.  GI: Soft. Bowel sounds are normal.  Musculoskeletal: Normal range of motion.  Neurological: She is alert and oriented to person, place, and time.  Skin: Skin is warm and dry.  Psychiatric: She has a normal mood and affect. Her behavior is normal.  As above    Review of Systems  Psychiatric/Behavioral: Positive for depression. The patient is nervous/anxious and has insomnia (improving).   no chest pain, no dyspnea  Blood pressure 92/63, pulse 99, temperature 98.3 F (36.8 C), temperature source Oral, resp. rate 16, height 5' (1.524 m), weight 53.1 kg (117 lb), SpO2 98 %.Body mass index is 22.85 kg/m.  General Appearance:grooming has improved   Eye Contact:  Good  Speech:  Normal Rate  Volume:  Decreased   Mood: less severely depressed but still sad  Affect: constricted, but no longer tearful and more reactive   Thought Process:  Goal Directed and Descriptions of Associations: Intact  Orientation:  Full (Time, Place, and Person)  Thought Content: no psychotic symptoms  Suicidal Thoughts:  No today denies suicidal or self injurious ideations  Homicidal Thoughts:  No  Memory:  Recent and remote grossly intact   Judgement:  Other:  improving   Insight:  improving   Psychomotor Activity:  Decreased   Concentration:  Good   Recall:  Good  Fund of Knowledge:  Good  Language:  Good  Akathisia:  No  Handed:    AIMS (if indicated):     Assets:  Communication Skills Desire for Improvement Resilience Social Support  ADL's:  Intact  Cognition:  WNL  Sleep:  Number of Hours: 6.75   Assessment - remains depressed, sad, but has improved partially compared to admission- less tearful, more reactive affect, less  overwhelmed by stressors, less hopeless, and more future oriented, such as working on moving in with mother after discharge. Tolerating medications well at this time.    Treatment Plan Summary: Reviewed as below today 5/15.    PLAN: Encourage increased group and milieu  participation to work on coping skills and symptom reduction Continue  Abilify to 7 mg QHS- see above. For mood disorder  Continue Wellbutrin XL 450 mgrs QDAY for depression Continue Zoloft 25 mgrs QDAY for depression, anxiety- prefer slower titration due to history of medication side effects. Continue Vistaril 25 mgrs Q 6 hours PRN for anxiety  Continue Trazodone 50 mgrs QHS PRN for insomnia  Treatment team working on disposition planning options   Jenne Campus, MD 07/26/2016, 8:28 AM Patient ID: Napoleon Form, female   DOB: [birth year], 46 y.o.   MRN: [id number]

## 2016-07-26 NOTE — Progress Notes (Signed)
D: Patient slept majority of the morning.  She came to get her medications around 1100.  She reports low energy and malaise.  She denies any thoughts of self harm today, however, has thoughts of dying sometimes.  She is able to contract for safety on the unit.  She is observed in the day room interacting well with others.  Patient asked for some imodium and has taken previous doses.  Explained to her that she might have rebound constipation from taking it and she declined.  She has since came to medication window and asked for milk of mag for constipation.  Her sleep and appetite are good; concentration is good.  Her goal today is to work on "my discharge plan."   A: Continue to monitor medication management and MD orders.  Safety checks continued every 15 minutes per protocol.  Offer support and encouragement as needed. R: Patient is receptive to staff; her behavior is appropriate.

## 2016-07-26 NOTE — Progress Notes (Signed)
Patient ID: Tina [name], female   DOB: 1970-06-13, 46 y.o.   MRN: [id number] D: Client visible on the unit, sitting in dayroom watching TV. Client reports depression "4" of 10. "I be more depressed in the morning, really tired." Client reports she missed her appointment with her endocrinologist since hospitalized. A: Writer provided emotional support, encouraged client to reports any concerns. Medications reviewed, administered as ordered. Staff will monitor q215min for safety. R: Client is safe on the unit, attended group.

## 2016-07-26 NOTE — Progress Notes (Signed)
Recreation Therapy Notes  Animal-Assisted Activity (AAA) Program Checklist/Progress Notes Patient Eligibility Criteria Checklist & Daily Group note for Rec TxIntervention  Date: 05.15.2018 Time: 2:45pm Location: [address] Dayroom    AAA/T Program Assumption of Risk Form signed by Patient/ or Parent Legal Guardian Yes  Patient is free of allergies or sever asthma Yes  Patient reports no fear of animals Yes  Patient reports no history of cruelty to animals Yes  Patient understands his/her participation is voluntary Yes  Patient washes hands before animal contact Yes  Patient washes hands after animal contact Yes  Behavioral Response: Appropriate   Education:Hand Washing, Appropriate Animal Interaction   Education Outcome: Acknowledges education.   Clinical Observations/Feedback: Patient attended session and interacted appropriately with therapy dog and peers.   Tina [name] Tina [name], Tina [name]        Tina [name] Tina 07/26/2016 2:57 PM 

## 2016-07-27 MED ORDER — SERTRALINE HCL 50 MG PO TABS
50.0000 mg | ORAL_TABLET | Freq: Every day | ORAL | Status: DC
  Administered 2016-07-28 – 2016-07-29 (×2): 50 mg via ORAL
  Filled 2016-07-27 (×4): qty 1

## 2016-07-27 NOTE — Plan of Care (Signed)
Problem: Safety: Goal: Periods of time without injury will increase Outcome: Progressing Pt. remains a low fall risk, denies SI/HI/AVH at this time, Q 15 checks in effect.    

## 2016-07-27 NOTE — Progress Notes (Signed)
Adult Psychoeducational Group Note  Date:  07/27/2016 Time:  9:53 PM  Group Topic/Focus:  Wrap-Up Group:   The focus of this group is to help patients review their daily goal of treatment and discuss progress on daily workbooks.  Participation Level:  Active  Participation Quality:  Appropriate and Attentive  Affect:  Appropriate  Cognitive:  Appropriate  Insight: Appropriate  Engagement in Group:  Engaged  Modes of Intervention:  Discussion  Additional Comments:  Pt stated her goal is to not sleep as much today, which she has improved on. Pt identified 2 coping skills: 1) deep breathing, 2) talking with people and prayer.  Tina [name], Dayon Witt C 07/27/2016, 9:53 PM

## 2016-07-27 NOTE — Plan of Care (Signed)
Problem: Education: Goal: Knowledge of the prescribed therapeutic regimen will improve Outcome: Progressing Nurse discussed depression/anxiety/coping skills with patient.    

## 2016-07-27 NOTE — Progress Notes (Signed)
Recreation Therapy Notes  Date: 07/27/16 Time: 0930 Location: [address] Dayroom  Group Topic: Stress Management  Goal Area(s) Addresses:  Patient will verbalize importance of using healthy stress management.  Patient will identify positive emotions associated with healthy stress management.   Intervention: Stress Management  Activity :  Body Scan Meditation.  LRT introduced the stress management technique of meditation.  LRT played a meditation from the Calm app to allow patients the opportunity to scan and acknowledge the sensations and tension within the body.  Patients were to follow along as the meditation played to engage in the activity.  Education:  Stress Management, Discharge Planning.   Education Outcome: Acknowledges edcuation/In group clarification offered/Needs additional education  Clinical Observations/Feedback: Pt did not attend group.   Caroll RancherMarjette Aleksei Goodlin, LRT/CTRS         Caroll RancherLindsay, Pedram Goodchild A 07/27/2016 12:57 PM

## 2016-07-27 NOTE — Progress Notes (Signed)
[facility] MD Progress Note  07/27/2016 1:32 PM Tina [name]  MRN:  [id number]   Subjective:  Patient continues to feel depressed, sad, but does feel that she has improved significantly. In particular, she states she has had no further suicidal ideations , including having no passive thoughts of wanting to die, which she had been experiencing. She is also feeling less overwhelmed about her financial stressors, and somewhat more hopeful that even though she owes money she cannot pay back at this time , " it is not the end of the world". Denies medication side effects. Has history of lightheadedness but does not endorse this today. Notes diurnal/circadian pattern, with worse depression in AM, improving as day continues .    Objective:  I have discussed case with treatment team and have met with patient. She is presenting with partial improvement. As above, describes improving mood , affect is still constricted, but does smile at times appropriately. Overall she presents less apprehensive, and is no longer tearful. She denies medication side effects- states she is going to go live with her mother for a period of time after discharge. No disruptive or agitated behaviors on unit .  Principal Problem: Bipolar 1 disorder, mixed ([place]) Diagnosis:   Patient Active Problem List   Diagnosis Date Noted  . Rash and nonspecific skin eruption [R21] 07/14/2016  . Tegretol toxicity [T42.1X1A] 07/09/2016  . Electrolyte imbalance [E87.8] 07/09/2016  . Bipolar I disorder, most recent episode depressed ([place]) [F31.30] 06/19/2016  . Bipolar 1 disorder, manic, full remission ([place]) [F31.74] 06/18/2016  . Migraine [G43.909] 02/20/2015  . Affective psychosis, bipolar ([place]) [F31.9]   . Bipolar disorder, current episode depressed, severe, without psychotic features ([place]) [F31.4]   . Bipolar affective disorder, current episode severe ([place]) [F31.9] 12/02/2014  . Bipolar I disorder, current or most recent episode  depressed, in partial remission ([place]) [F31.75] 07/11/2014  . Bipolar 1 disorder, mixed ([place]) [F31.60] 06/27/2014  . Disease of thyroid gland [E07.9] 06/27/2014  . Anxiety [F41.9] 06/27/2014  . 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 Cannabis dependence without physiological dependence ([place]) [F12.20] 06/27/2014  . Bipolar 1 disorder, depressed, partial remission ([place]) [F31.75] 06/27/2014  . 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 Alcohol use disorder, severe, dependence ([place]) [F10.20] 06/24/2014  . Bipolar disorder, unspecified ([place]) [F31.9] 02/16/2013  . Adrenal insufficiency ([place]) [E27.40]   . Acne [L70.9]   . Hx of Lyme disease [Z86.19]   . 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 Bipolar I disorder, most recent episode (or current) manic ([place]) [F31.10] 08/29/2012  . Hypothyroidism [E03.9] 12/23/2010  . Myalgia [M79.1] 12/23/2010  . Fatigue [R53.83] 12/23/2010  . Unspecified vitamin deficiency [E56.9] 12/23/2010  . Depression [F32.9]    Total Time spent with patient: 20 minutes  Past Psychiatric History: As in H&P  Past Medical History:  Past Medical History:  Diagnosis Date  . Acne   . Adrenal insufficiency ([place])   . Bipolar 2 disorder ([place])   . Cellulitis   . Depression   . Genital warts   . Headache   . History of chicken pox   . Hx of Lyme disease   . Hypothyroidism     Past Surgical History:  Procedure Laterality Date  . CESAREAN SECTION  2003, 2007, 2011  . INGUINAL HERNIA REPAIR Bilateral   . TONSILLECTOMY    . UMBILICAL HERNIA REPAIR     Family History:  Family History  Problem Relation Age of Onset  . Other Mother  PAD  . Heart disease Other        Grandparent  . Alcohol abuse Paternal Grandmother   . Mental retardation Paternal Grandmother   . Arthritis Maternal Grandfather   . Heart disease Maternal Grandfather    Family Psychiatric  History: As in H&P  Social History:  History  Alcohol Use  . Yes    Comment: social drinker-few  drinks a week      History  Drug Use No    Social History   Social History  . Marital status: Single    Spouse name: N/A  . Number of children: N/A  . Years of education: 34   Occupational History  . HOMEMAKER    Social History Main Topics  . Smoking status: Current Every Day Smoker    Packs/day: 1.50    Years: 29.00    Types: Cigarettes  . Smokeless tobacco: Never Used  . Alcohol use Yes     Comment: social drinker-few drinks a week   . Drug use: No  . Sexual activity: Not Currently   Other Topics Concern  . None   Social History Narrative   Regular exercise-no   Additional Social History:        Sleep: Good  Appetite:  Fair- improving   Current Medications: Current Facility-Administered Medications  Medication Dose Route Frequency Provider Last Rate Last Dose  . acetaminophen (TYLENOL) tablet 650 mg  650 mg Oral Q6H PRN Kerrie Buffalo, NP   650 mg at 07/24/16 1311  . alum & mag hydroxide-simeth (MAALOX/MYLANTA) 200-200-20 MG/5ML suspension 30 mL  30 mL Oral Q4H PRN Kerrie Buffalo, NP      . ARIPiprazole (ABILIFY) tablet 7 mg  7 mg Oral BH-q7a Hrithik Boschee, Myer Peer, MD   7 mg at 07/27/16 7096  . buPROPion (WELLBUTRIN XL) 24 hr tablet 450 mg  450 mg Oral Daily Izediuno, Laruth Bouchard, MD   450 mg at 07/27/16 0904  . feeding supplement (ENSURE ENLIVE) (ENSURE ENLIVE) liquid 237 mL  237 mL Oral BID BM Lyn Deemer, Myer Peer, MD   237 mL at 07/27/16 0906  . hydrOXYzine (ATARAX/VISTARIL) tablet 25 mg  25 mg Oral TID PRN Kerrie Buffalo, NP   25 mg at 07/25/16 1427  . ibuprofen (ADVIL,MOTRIN) tablet 400 mg  400 mg Oral Q6H PRN Kerrie Buffalo, NP   400 mg at 07/19/16 2146  . magnesium hydroxide (MILK OF MAGNESIA) suspension 30 mL  30 mL Oral Daily PRN Kerrie Buffalo, NP   30 mL at 07/26/16 1429  . neomycin-bacitracin-polymyxin (NEOSPORIN) ointment   Topical TID Debbe Odea, MD      . nicotine (NICODERM CQ - dosed in mg/24 hours) patch 21 mg  21 mg Transdermal Daily Kerrie Buffalo, NP   21 mg at 07/24/16 2836  . sertraline (ZOLOFT) tablet 25 mg  25 mg Oral Daily Ronit Cranfield, Myer Peer, MD   25 mg at 07/27/16 0905  . thyroid (ARMOUR) tablet 90 mg  90 mg Oral QAC breakfast Lorielle Boehning, Myer Peer, MD   90 mg at 07/27/16 0630  . traZODone (DESYREL) tablet 50 mg  50 mg Oral QHS PRN Kerrie Buffalo, NP   50 mg at 07/26/16 2132    Lab Results: No results found for this or any previous visit (from the past 48 hour(s)).  Blood Alcohol level:  Lab Results  Component Value Date   [facility] <5 07/08/2016   ETH <5 62/94/7654    Metabolic Disorder Labs: Lab Results  Component Value Date  HGBA1C 5.1 12/03/2014   MPG 100 12/03/2014   MPG 100 12/02/2014   No results found for: PROLACTIN Lab Results  Component Value Date   CHOL 195 12/03/2014   TRIG 122 12/03/2014   HDL 42 12/03/2014   CHOLHDL 4.6 12/03/2014   VLDL 24 12/03/2014   LDLCALC 129 (H) 12/03/2014    Physical Findings: AIMS: Facial and Oral Movements Muscles of Facial Expression: None, normal Lips and Perioral Area: None, normal Jaw: None, normal Tongue: None, normal,Extremity Movements Upper (arms, wrists, hands, fingers): None, normal Lower (legs, knees, ankles, toes): None, normal, Trunk Movements Neck, shoulders, hips: None, normal, Overall Severity Severity of abnormal movements (highest score from questions above): None, normal Incapacitation due to abnormal movements: None, normal Patient's awareness of abnormal movements (rate only patient's report): No Awareness, Dental Status Current problems with teeth and/or dentures?: No Does patient usually wear dentures?: No  CIWA:  CIWA-Ar Total: 1 COWS:  COWS Total Score: 2  Musculoskeletal: Strength & Muscle Tone: within normal limits Gait & Station: normal Patient leans: N/A  Psychiatric Specialty Exam: Physical Exam  Constitutional: She is oriented to person, place, and time. She appears well-developed.  HENT:  Head: Normocephalic and atraumatic.   Eyes: Conjunctivae are normal. Pupils are equal, round, and reactive to light.  Neck: Normal range of motion. Neck supple.  Cardiovascular: Normal rate.   Respiratory: Effort normal and breath sounds normal.  GI: Soft. Bowel sounds are normal.  Musculoskeletal: Normal range of motion.  Neurological: She is alert and oriented to person, place, and time.  Skin: Skin is warm and dry.  Psychiatric: She has a normal mood and affect. Her behavior is normal.  As above    Review of Systems  Psychiatric/Behavioral: Positive for depression. The patient is nervous/anxious and has insomnia (improving).   lightheaded at times, but not today. No chest pain, no vomiting   Blood pressure (!) 91/59, pulse 89, temperature 97.6 F (36.4 C), temperature source Oral, resp. rate 16, height 5' (1.524 m), weight 53.1 kg (117 lb), SpO2 98 %.Body mass index is 22.85 kg/m.  General Appearance:improved grooming   Eye Contact:  Good  Speech:  Normal Rate  Volume:  Soft   Mood: partial improvement, but still depressed   Affect: less severely constricted, smiles appropriately at times   Thought Process:  Linear and Descriptions of Associations: Intact  Orientation:  Full (Time, Place, and Person)  Thought Content:  No hallucinations, no delusions , not internally preoccupied   Suicidal Thoughts:  No today denies suicidal or self injurious ideations- contracts for safety on unit   Homicidal Thoughts:  No  Memory:  Recent and remote grossly intact   Judgement:  Other:  improving   Insight:  improving   Psychomotor Activity:  Decreased   Concentration:  Good   Recall:  Good  Fund of Knowledge:  Good  Language:  Good  Akathisia:  Negative  Handed:    AIMS (if indicated):     Assets:  Desire for Improvement Resilience Social Support  ADL's:  Intact  Cognition:  WNL  Sleep:  Number of Hours: 6.75   Assessment - Patient is 46 year old female with history of severe mood disorder, severe depressive episodes,  and history of suicide attempts. At this time mood remains depressed, but has improved since admission- patient denying any suicidal ideations at this time, and noted to be more future oriented-speaks more about her children, how she misses them, how she wants to see them  soon, and plans to stay with her mother after discharge ( as lives alone and it is felt that going back to empty home could exacerbate depression). Tolerating medications well .  Treatment Plan Summary: Reviewed as below today 5/16    PLAN: Encourage increased group and milieu participation to work on coping skills and symptom reduction Continue  Abilify  7 mg QHS- see above. For mood disorder  Continue Wellbutrin XL 450 mgrs QDAY for depression Increase  Zoloft to 50  mgrs QDAY for depression, anxiety Continue Vistaril 25 mgrs Q 6 hours PRN for anxiety  Continue Trazodone 50 mgrs QHS PRN for insomnia  Treatment team working on disposition planning options   Jenne Campus, MD 07/27/2016, 1:32 PM Patient ID: Tateanna Bach, female   DOB: June 07, 1970, 46 y.o.   MRN: [id number]

## 2016-07-27 NOTE — Progress Notes (Signed)
D:  Patient's self inventory sheet, patient sleeps good, sleep medication helpful.   Good appetite, low energy level, good concentration.  Rated depression and hopeless 6, anxiety 3.  Denied withdrawals.  Denied SI.  Physical problems, lightheaded.  Denied physical pain.  Goal is wake up to go get vitals and go to breakfast.  No discharge plans. A:  Medications administered per MD orders.  Emotional support and encouragement given patient. R:  Denied SI and HI, contracts for safety.  Denied A/V hallucinations.  Safety maintained with 15 minute checks.  Patient stated she would like abilify to be changed back to night medicine.  Patient feels the abilify is causing her to be very sleepy and is sleeping until noon.

## 2016-07-27 NOTE — [facility] Group Notes (Signed)
[facility] LCSW Group Therapy 07/27/2016 1:15 PM  Type of Therapy: Group Therapy- Emotion Regulation  Participation Level: Pt was present for the duration of the group. Pt did not participate in the discussion, but did listen attentively throughout.  Jonathon JordanLynn B Issachar Broady, MSW, LCSWA 07/27/2016 4:54 PM

## 2016-07-28 NOTE — Progress Notes (Signed)
[facility] MD Progress Note  07/28/2016 6:12 PM Ivanka Kirshner  MRN:  [id number]   Subjective: patient continues to feel better than she did , and states that she is now feeling less overwhelmed and more ready for discharge. She denies medication side effects. She denies suicidal ideations.    Objective:  I have discussed case with treatment team and have met with patient. Compared to admission patient has improved, and is exhibiting improving mood and fuller range of affect. She states she has had decreased  suicidal ideations over recent days and none today. She is less ruminative about her stressors, such as financial difficulties, and as above, feels less anxious/overwhelmed. Visible in day room, less isolative. No disruptive or agitated behaviors, going to some groups. Denies medication side effects. BP has tended to be low, but patient is reporting improving dizziness, lightheadedness. Plans to continue work up for potential adrenal insufficiency on an outpatient basis after her discharge.  Principal Problem: Bipolar 1 disorder, mixed ([place]) Diagnosis:   Patient Active Problem List   Diagnosis Date Noted  . Rash and nonspecific skin eruption [R21] 07/14/2016  . Tegretol toxicity [T42.1X1A] 07/09/2016  . Electrolyte imbalance [E87.8] 07/09/2016  . Bipolar I disorder, most recent episode depressed ([place]) [F31.30] 06/19/2016  . Bipolar 1 disorder, manic, full remission ([place]) [F31.74] 06/18/2016  . Migraine [G43.909] 02/20/2015  . Affective psychosis, bipolar ([place]) [F31.9]   . Bipolar disorder, current episode depressed, severe, without psychotic features ([place]) [F31.4]   . Bipolar affective disorder, current episode severe ([place]) [F31.9] 12/02/2014  . Bipolar I disorder, current or most recent episode depressed, in partial remission ([place]) [F31.75] 07/11/2014  . Bipolar 1 disorder, mixed ([place]) [F31.60] 06/27/2014  . Disease of thyroid gland [E07.9] 06/27/2014  . Anxiety [F41.9] 06/27/2014   . 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 Cannabis dependence without physiological dependence ([place]) [F12.20] 06/27/2014  . Bipolar 1 disorder, depressed, partial remission ([place]) [F31.75] 06/27/2014  . 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 Alcohol use disorder, severe, dependence ([place]) [F10.20] 06/24/2014  . Bipolar disorder, unspecified ([place]) [F31.9] 02/16/2013  . Adrenal insufficiency ([place]) [E27.40]   . Acne [L70.9]   . Hx of Lyme disease [Z86.19]   . 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 Bipolar I disorder, most recent episode (or current) manic ([place]) [F31.10] 08/29/2012  . Hypothyroidism [E03.9] 12/23/2010  . Myalgia [M79.1] 12/23/2010  . Fatigue [R53.83] 12/23/2010  . Unspecified vitamin deficiency [E56.9] 12/23/2010  . Depression [F32.9]    Total Time spent with patient: 20 minutes  Past Psychiatric History: As in H&P  Past Medical History:  Past Medical History:  Diagnosis Date  . Acne   . Adrenal insufficiency ([place])   . Bipolar 2 disorder ([place])   . Cellulitis   . Depression   . Genital warts   . Headache   . History of chicken pox   . Hx of Lyme disease   . Hypothyroidism     Past Surgical History:  Procedure Laterality Date  . CESAREAN SECTION  2003, 2007, 2011  . INGUINAL HERNIA REPAIR Bilateral   . TONSILLECTOMY    . UMBILICAL HERNIA REPAIR     Family History:  Family History  Problem Relation Age of Onset  . Other Mother        PAD  . Heart disease Other        Grandparent  . Alcohol abuse Paternal Grandmother   . Mental retardation Paternal Grandmother   . Arthritis Maternal Grandfather   .  Heart disease Maternal Grandfather    Family Psychiatric  History: As in H&P  Social History:  History  Alcohol Use  . Yes    Comment: social drinker-few drinks a week      History  Drug Use No    Social History   Social History  . Marital status: Single    Spouse name: N/A  . Number of children: N/A  . Years of education: 72    Occupational History  . HOMEMAKER    Social History Main Topics  . Smoking status: Current Every Day Smoker    Packs/day: 1.50    Years: 29.00    Types: Cigarettes  . Smokeless tobacco: Never Used  . Alcohol use Yes     Comment: social drinker-few drinks a week   . Drug use: No  . Sexual activity: Not Currently   Other Topics Concern  . None   Social History Narrative   Regular exercise-no   Additional Social History:        Sleep: Good  Appetite:  Good  Current Medications: Current Facility-Administered Medications  Medication Dose Route Frequency Provider Last Rate Last Dose  . acetaminophen (TYLENOL) tablet 650 mg  650 mg Oral Q6H PRN Kerrie Buffalo, NP   650 mg at 07/24/16 1311  . alum & mag hydroxide-simeth (MAALOX/MYLANTA) 200-200-20 MG/5ML suspension 30 mL  30 mL Oral Q4H PRN Kerrie Buffalo, NP      . ARIPiprazole (ABILIFY) tablet 7 mg  7 mg Oral BH-q7a [place] Defina, Myer Peer, MD   7 mg at 07/28/16 6579  . buPROPion (WELLBUTRIN XL) 24 hr tablet 450 mg  450 mg Oral Daily Izediuno, Laruth Bouchard, MD   450 mg at 07/28/16 0921  . feeding supplement (ENSURE ENLIVE) (ENSURE ENLIVE) liquid 237 mL  237 mL Oral BID BM Aldric Wenzler, Myer Peer, MD   237 mL at 07/28/16 0383  . hydrOXYzine (ATARAX/VISTARIL) tablet 25 mg  25 mg Oral TID PRN Kerrie Buffalo, NP   25 mg at 07/25/16 1427  . ibuprofen (ADVIL,MOTRIN) tablet 400 mg  400 mg Oral Q6H PRN Kerrie Buffalo, NP   400 mg at 07/19/16 2146  . magnesium hydroxide (MILK OF MAGNESIA) suspension 30 mL  30 mL Oral Daily PRN Kerrie Buffalo, NP   30 mL at 07/26/16 1429  . neomycin-bacitracin-polymyxin (NEOSPORIN) ointment   Topical TID Debbe Odea, MD      . nicotine (NICODERM CQ - dosed in mg/24 hours) patch 21 mg  21 mg Transdermal Daily Kerrie Buffalo, NP   21 mg at 07/28/16 0920  . sertraline (ZOLOFT) tablet 50 mg  50 mg Oral Daily Mansur Patti, Myer Peer, MD   50 mg at 07/28/16 0921  . thyroid (ARMOUR) tablet 90 mg  90 mg Oral QAC breakfast  Jonmichael Beadnell, Myer Peer, MD   90 mg at 07/28/16 [phone]  . traZODone (DESYREL) tablet 50 mg  50 mg Oral QHS PRN Kerrie Buffalo, NP   50 mg at 07/27/16 2123    Lab Results: No results found for this or any previous visit (from the past 48 hour(s)).  Blood Alcohol level:  Lab Results  Component Value Date   [facility] <5 07/08/2016   ETH <5 29/19/1660    Metabolic Disorder Labs: Lab Results  Component Value Date   HGBA1C 5.1 12/03/2014   MPG 100 12/03/2014   MPG 100 12/02/2014   No results found for: PROLACTIN Lab Results  Component Value Date   CHOL 195 12/03/2014   TRIG 122 12/03/2014  HDL 42 12/03/2014   CHOLHDL 4.6 12/03/2014   VLDL 24 12/03/2014   LDLCALC 129 (H) 12/03/2014    Physical Findings: AIMS: Facial and Oral Movements Muscles of Facial Expression: None, normal Lips and Perioral Area: None, normal Jaw: None, normal Tongue: None, normal,Extremity Movements Upper (arms, wrists, hands, fingers): None, normal Lower (legs, knees, ankles, toes): None, normal, Trunk Movements Neck, shoulders, hips: None, normal, Overall Severity Severity of abnormal movements (highest score from questions above): None, normal Incapacitation due to abnormal movements: None, normal Patient's awareness of abnormal movements (rate only patient's report): No Awareness, Dental Status Current problems with teeth and/or dentures?: No Does patient usually wear dentures?: No  CIWA:  CIWA-Ar Total: 1 COWS:  COWS Total Score: 2  Musculoskeletal: Strength & Muscle Tone: within normal limits Gait & Station: normal Patient leans: N/A  Psychiatric Specialty Exam: Physical Exam  Constitutional: She is oriented to person, place, and time. She appears well-developed.  HENT:  Head: Normocephalic and atraumatic.  Eyes: Conjunctivae are normal. Pupils are equal, round, and reactive to light.  Neck: Normal range of motion. Neck supple.  Cardiovascular: Normal rate.   Respiratory: Effort normal and breath  sounds normal.  GI: Soft. Bowel sounds are normal.  Musculoskeletal: Normal range of motion.  Neurological: She is alert and oriented to person, place, and time.  Skin: Skin is warm and dry.  Psychiatric: She has a normal mood and affect. Her behavior is normal.  As above    Review of Systems  Psychiatric/Behavioral: Positive for depression. The patient is nervous/anxious and has insomnia (improving).   lightheaded at times, but not today. No chest pain, no vomiting   Blood pressure (!) 76/50, pulse 91, temperature 97.6 F (36.4 C), temperature source Oral, resp. rate 16, height 5' (1.524 m), weight 53.1 kg (117 lb), SpO2 98 %.Body mass index is 22.85 kg/m.  General Appearance:well groomed   Eye Contact:  Good  Speech:  Normal Rate  Volume:  Improving , less soft   Mood: gradually improving , feels less depressed    Affect: less constricted, more reactive, smiles at times appropriately   Thought Process:  Goal Directed and Descriptions of Associations: Intact  Orientation:  Other:  fully alert and attentive   Thought Content:   No psychotic symptoms   Suicidal Thoughts:  No today denies suicidal or self injurious ideations- contracts for safety on unit   Homicidal Thoughts:  No  Memory:  Recent and remote grossly intact   Judgement:  Other:  improved   Insight:  improved  Psychomotor Activity:  normal  Concentration:  Good   Recall:  Good  Fund of Knowledge:  Good  Language:  Good  Akathisia:  Negative  Handed:  Right  AIMS (if indicated):     Assets:  Desire for Improvement Resilience Social Support  ADL's:  Intact  Cognition:  WNL  Sleep:  Number of Hours: 6.75   Assessment - patient presents improved - better groomed, more visible in day room, improving range of affect, more future oriented. Denies any current suicidal ideations. Reports she plans to go live with her mother for a period of time after discharge. Denies medication side effects.   Treatment Plan  Summary: Reviewed as below today 5/17    PLAN: Encourage increased group and milieu participation to work on coping skills and symptom reduction Continue  Abilify  7 mg QHS- see above. For mood disorder  Continue Wellbutrin XL 450 mgrs QDAY for depression Continue  Zoloft  50  mgrs QDAY for depression, anxiety Continue Vistaril 25 mgrs Q 6 hours PRN for anxiety  Continue Trazodone 50 mgrs QHS PRN for insomnia  Treatment team working on disposition planning options   Jenne Campus, MD 07/28/2016, 6:12 PM Patient ID: Napoleon Form, female   DOB: [birth year], 47 y.o.   MRN: [id number]

## 2016-07-28 NOTE — Progress Notes (Signed)
Family session via conference call has been confirmed for 11 AM tomorrow.   Jonathon JordanLynn B Athaliah Baumbach, MSW, Theresia MajorsLCSWA [phone]

## 2016-07-28 NOTE — Progress Notes (Signed)
Nutrition Education Note  Pt attended group focusing on general, healthful nutrition education.  RD emphasized the importance of eating regular meals and snacks throughout the day. Consuming sugar-free beverages and incorporating fruits and vegetables into diet when possible. Provided examples of healthy snacks. Patient encouraged to leave group with a goal to improve nutrition/healthy eating.   Diet Order: Diet regular Room service appropriate? Yes; Fluid consistency: Thin Pt is also offered choice of unit snacks mid-morning and mid-afternoon.  Pt is eating as desired.   If additional nutrition issues arise, please consult RD.    Tina Borromeo, MS, RD, LDN, CNSC Inpatient Clinical Dietitian Pager # [phone] After hours/weekend pager # [phone]     

## 2016-07-28 NOTE — [facility] Group Notes (Signed)
[facility] LCSW Group Therapy 07/28/2016 1:15pm  Type of Therapy: Group Therapy- Balance in Life  Participation Level: Pt was present for the duration of the group. Pt did not participate in the discussion, however, she did listen attentively throughout.    Jonathon JordanLynn B Deashia Soule, MSW, LCSWA 07/28/2016 3:35 PM

## 2016-07-28 NOTE — Progress Notes (Signed)
  DATA ACTION RESPONSE  Objective- Pt. is visible in the dayroom, seen watching TV. Presents with an a sad affect and mood. [place] on approach. No further c/o. fSubjective- Denies having any SI/HI/AVH/Pain at this time. Pt. states " I'm just ready to get out of here, I will stay with my mother".           Is cooperative and remain safe on the unit.  1:1 interaction in private to establish rapport. Encouragement, education, & support given from staff.  PRN Trazodone requested and will re-eval accordingly.   Safety maintained with Q 15 checks. Continues to follow treatment plan and will monitor closely. No additonal questions/concerns noted.

## 2016-07-28 NOTE — Progress Notes (Signed)
D:Pt has a depressed/anxious affect. She rates depression as a 6 and anxiety as a 3 on 0-10 scale with 10 being the most. Pt did not attend morning group but has been out more in the afternoon. She has been in the dayroom and attending afternoon group. A:Offered support, encouragement and 15 minute checks. R:Pt denies si and hi. Safety maintained on the unit.

## 2016-07-28 NOTE — Progress Notes (Signed)
   07/28/16 0631  Vital Signs  Pulse Rate 91  Pulse Rate Source Dinamap  BP (!) 76/50 (Given gatorade; encourage to push fluids and lay down. )  Patient Position (if appropriate) Standing   Will monitor and re-check.

## 2016-07-28 NOTE — Progress Notes (Signed)
CSW called pt's mother Genelle Gather(Nancy Shue [phone]) to inform her of pt's upcoming discharge tomorrow. Pt's mother states that she has some concerns about pt's medications and follow-up appointments. CSW requested a family session via conference call and pt's mother agreed. CSW will talk with MD about family session to schedule a time.  Jonathon JordanLynn B Ica Daye, MSW, Theresia MajorsLCSWA [phone]

## 2016-07-28 NOTE — Progress Notes (Signed)
[facility] Group Notes:  (Nursing/MHT/Case Management/Adjunct)  Date:  07/28/2016  Time:  1000  Type of Therapy:  Nurse Education  Participation Level:  Did Not Attend  Participation Quality:    Affect:    Cognitive:    Insight:    Engagement in Group:    Modes of Intervention:    Summary of Progress/Problems:  Beatrix ShipperWright, Natara Monfort Martin 07/28/2016, 3:32 PM

## 2016-07-29 MED ORDER — ARIPIPRAZOLE 2 MG PO TABS: 7.0000 mg | ORAL_TABLET | ORAL | 0 refills | Status: DC

## 2016-07-29 MED ORDER — TRAZODONE HCL 50 MG PO TABS: 50.0000 mg | ORAL_TABLET | Freq: Every evening | ORAL | 0 refills | Status: DC | PRN

## 2016-07-29 MED ORDER — SERTRALINE HCL 50 MG PO TABS: 50.0000 mg | ORAL_TABLET | Freq: Every day | ORAL | 0 refills | Status: DC

## 2016-07-29 MED ORDER — THYROID 90 MG PO TABS: 90.0000 mg | ORAL_TABLET | Freq: Every day | ORAL | 0 refills | Status: DC

## 2016-07-29 MED ORDER — HYDROXYZINE HCL 25 MG PO TABS: 25.0000 mg | ORAL_TABLET | Freq: Three times a day (TID) | ORAL | 0 refills | Status: DC | PRN

## 2016-07-29 MED ORDER — BUPROPION HCL ER (XL) 450 MG PO TB24: 450.0000 mg | ORAL_TABLET | Freq: Every day | ORAL | 0 refills | Status: DC

## 2016-07-29 NOTE — Tx Team (Signed)
Interdisciplinary Treatment and Diagnostic Plan Update 07/29/2016 Time of Session: 9:30am  Diona FoleyLaurie Kristin Masci  MRN: [id number]  Principal Diagnosis: Bipolar 1 disorder, mixed (HCC)  Secondary Diagnoses: Principal Problem:   Bipolar 1 disorder, mixed (HCC) Active Problems:   Rash and nonspecific skin eruption   Current Medications:  Current Facility-Administered Medications  Medication Dose Route Frequency Provider Last Rate Last Dose  . acetaminophen (TYLENOL) tablet 650 mg  650 mg Oral Q6H PRN Adonis BrookAgustin, Sheila, NP   650 mg at 07/24/16 1311  . alum & mag hydroxide-simeth (MAALOX/MYLANTA) 200-200-20 MG/5ML suspension 30 mL  30 mL Oral Q4H PRN Adonis BrookAgustin, Sheila, NP      . ARIPiprazole (ABILIFY) tablet 7 mg  7 mg Oral BH-q7a Cobos, Rockey SituFernando A, MD   7 mg at 07/29/16 0616  . buPROPion (WELLBUTRIN XL) 24 hr tablet 450 mg  450 mg Oral Daily Izediuno, Delight OvensVincent A, MD   450 mg at 07/29/16 0823  . feeding supplement (ENSURE ENLIVE) (ENSURE ENLIVE) liquid 237 mL  237 mL Oral BID BM Cobos, Fernando A, MD   237 mL at 07/29/16 1028  . hydrOXYzine (ATARAX/VISTARIL) tablet 25 mg  25 mg Oral TID PRN Adonis BrookAgustin, Sheila, NP   25 mg at 07/25/16 1427  . ibuprofen (ADVIL,MOTRIN) tablet 400 mg  400 mg Oral Q6H PRN Adonis BrookAgustin, Sheila, NP   400 mg at 07/19/16 2146  . magnesium hydroxide (MILK OF MAGNESIA) suspension 30 mL  30 mL Oral Daily PRN Adonis BrookAgustin, Sheila, NP   30 mL at 07/26/16 1429  . neomycin-bacitracin-polymyxin (NEOSPORIN) ointment   Topical TID Calvert Cantorizwan, Saima, MD      . nicotine (NICODERM CQ - dosed in mg/24 hours) patch 21 mg  21 mg Transdermal Daily Adonis BrookAgustin, Sheila, NP   21 mg at 07/29/16 0824  . sertraline (ZOLOFT) tablet 50 mg  50 mg Oral Daily Cobos, Rockey SituFernando A, MD   50 mg at 07/29/16 [id number]  . thyroid (ARMOUR) tablet 90 mg  90 mg Oral QAC breakfast Cobos, Rockey SituFernando A, MD   90 mg at 07/29/16 0616  . traZODone (DESYREL) tablet 50 mg  50 mg Oral QHS PRN Adonis BrookAgustin, Sheila, NP   50 mg at 07/28/16 2132    PTA  Medications: Prescriptions Prior to Admission  Medication Sig Dispense Refill Last Dose  . lamoTRIgine (LAMICTAL) 25 MG tablet Take 1 tablet (25 mg total) by mouth 2 (two) times daily. 30 tablet 0   . mupirocin cream (BACTROBAN) 2 % Apply topically 2 (two) times daily. 15 g 0   . thyroid (ARMOUR) 90 MG tablet Take 1 tablet (90 mg total) by mouth daily before breakfast. 30 tablet 0 unk  . venlafaxine XR (EFFEXOR-XR) 75 MG 24 hr capsule Take 1 capsule (75 mg total) by mouth daily with breakfast. 30 capsule 0 unk    Treatment Modalities: Medication Management, Group therapy, Case management,  1 to 1 session with clinician, Psychoeducation, Recreational therapy.  Patient Stressors: Financial difficulties Occupational concerns Patient Strengths: Average or above average intelligence Capable of independent living Supportive family/friends  Physician Treatment Plan for Primary Diagnosis: Bipolar 1 disorder, mixed (HCC) Long Term Goal(s): Improvement in symptoms so as ready for discharge Short Term Goals: Ability to maintain clinical measurements within normal limits will improve Compliance with prescribed medications will improve Ability to identify changes in lifestyle to reduce recurrence of condition will improve Ability to verbalize feelings will improve Ability to disclose and discuss suicidal ideas Ability to demonstrate self-control will improve Ability to identify and develop  effective coping behaviors will improve  Medication Management: Evaluate patient's response, side effects, and tolerance of medication regimen.  Therapeutic Interventions: 1 to 1 sessions, Unit Group sessions and Medication administration.  Evaluation of Outcomes: Adequate for Discharge  Physician Treatment Plan for Secondary Diagnosis: Principal Problem:   Bipolar 1 disorder, mixed (HCC) Active Problems:   Rash and nonspecific skin eruption  Long Term Goal(s): Improvement in symptoms so as ready for  discharge  Short Term Goals: Ability to maintain clinical measurements within normal limits will improve Compliance with prescribed medications will improve Ability to identify changes in lifestyle to reduce recurrence of condition will improve Ability to verbalize feelings will improve Ability to disclose and discuss suicidal ideas Ability to demonstrate self-control will improve Ability to identify and develop effective coping behaviors will improve  Medication Management: Evaluate patient's response, side effects, and tolerance of medication regimen.  Therapeutic Interventions: 1 to 1 sessions, Unit Group sessions and Medication administration.  Evaluation of Outcomes: Adequate for Discharge  RN Treatment Plan for Primary Diagnosis: Bipolar 1 disorder, mixed (HCC) Long Term Goal(s): Knowledge of disease and therapeutic regimen to maintain health will improve  Short Term Goals: Ability to remain free from injury will improve and Compliance with prescribed medications will improve  Medication Management: RN will administer medications as ordered by provider, will assess and evaluate patient's response and provide education to patient for prescribed medication. RN will report any adverse and/or side effects to prescribing provider.  Therapeutic Interventions: 1 on 1 counseling sessions, Psychoeducation, Medication administration, Evaluate responses to treatment, Monitor vital signs and CBGs as ordered, Perform/monitor CIWA, COWS, AIMS and Fall Risk screenings as ordered, Perform wound care treatments as ordered.  Evaluation of Outcomes: Adequate for Discharge  LCSW Treatment Plan for Primary Diagnosis: Bipolar 1 disorder, mixed (HCC) Long Term Goal(s): Safe transition to appropriate next level of care at discharge, Engage patient in therapeutic group addressing interpersonal concerns. Short Term Goals: Engage patient in aftercare planning with referrals and resources, Increase ability to  appropriately verbalize feelings, Increase emotional regulation, Identify triggers associated with mental health/substance abuse issues and Increase skills for wellness and recovery  Therapeutic Interventions: Assess for all discharge needs, 1 to 1 time with Social worker, Explore available resources and support systems, Assess for adequacy in community support network, Educate family and significant other(s) on suicide prevention, Complete Psychosocial Assessment, Interpersonal group therapy.  Evaluation of Outcomes: Adequate for Discharge  Progress in Treatment: Attending groups: Yes Participating in groups: Yes Taking medication as prescribed: Yes, MD continues to assess for medication changes as needed Toleration medication: Yes, no side effects reported at this time Family/Significant other contact made: Yes, pt's mother contacted. Patient understands diagnosis: Yes, AEB pt's willingness to participate in treatment. Discussing patient identified problems/goals with staff: Yes Medical problems stabilized or resolved: Yes Denies suicidal/homicidal ideation: Yes Issues/concerns per patient self-inventory: None Other: N/A  New problem(s) identified: None identified at this time.   New Short Term/Long Term Goal(s): None identified at this time.   Discharge Plan or Barriers: Pt will return home and follow up outpatient with IOP or the [facility].  07/29/16: Pt will discharge to her mother's home and follow- up outpatient with the [facility].  Reason for Continuation of Hospitalization:  None identified at this time.  Estimated Length of Stay: 0 days  Attendees: Patient: 07/29/2016 10:31 AM  Physician: Dr. Jama Flavors 07/29/2016 10:31 AM  Nursing: Alexia Freestone RN; Boyd Kerbs, RN 07/29/2016 10:31 AM  RN Care Manager: Onnie Boer, RN  07/29/2016 10:31 AM  Social Worker: Donnelly Stager, LCSWA 07/29/2016 10:31 AM  Recreational Therapist:  07/29/2016 10:31 AM  Other: Armandina Stammer, NP; Gray Bernhardt, NP  07/29/2016 10:31 AM  Other:  07/29/2016 10:31 AM  Other: 07/29/2016 10:31 AM   Scribe for Treatment Team: Jonathon Jordan, MSW,LCSWA 07/29/2016 10:31 AM

## 2016-07-29 NOTE — Progress Notes (Signed)
  [facility] :  Will you be returning to the same living situation after discharge:  No. Pt will be going to live with her mother. At discharge, do you have transportation home?: Yes,  pt's mother will transport. Do you have the ability to pay for your medications: Yes,  pt has insurance.  Release of information consent forms completed and in the chart;  Patient's signature needed at discharge.  Patient to Follow up at: Follow-up Information    [facility] Follow up.   Specialty:  Behavioral Health Why:  [facility] 5/29 at 2:30pm for medication management with Dr. Mila HomerSena. Please call the office if you need to cancel/reschedule your appointments. Contact information: [address] [place] [address] [phone]        [facility] Follow up.   Specialty:  Behavioral Health Why:  Patient will be followed by Transitional Care Team, services to begin on day of discharge.   Contact information: [address] [place] [address] [phone]           Next level of care provider [facility]:no  Safety Planning and Suicide Prevention discussed: Yes,  with pt, and with pt's mother.  Have you used any form of tobacco in the last 30 days? (Cigarettes, Smokeless Tobacco, Cigars, and/or Pipes): Yes  Has patient been referred to the Quitline?: Patient refused referral  Patient has been referred for addiction treatment: Yes  Jonathon JordanLynn B Rayan Ines, MSW, LCSWA  07/29/2016, 10:33 AM

## 2016-07-29 NOTE — Progress Notes (Signed)
D: Pt was in the dayroom upon initial approach.  Pt presents with appropriate affect and mood.  Her goal today was to "talk about my discharge plan more."  Pt reports she is discharging tomorrow and she feels safe to do so.  She reports she plans to follow up at the "[facility]."  Pt denies SI/HI, denies hallucinations, denies pain.  Pt has been visible in milieu interacting with peers and staff appropriately.  Pt attended evening group.    A: Introduced self to pt.  Actively listened to pt and offered support and encouragement. PRN medication administered for sleep.  Q15 minute safety checks maintained.  R: Pt is safe on the unit.  Pt verbally contracts for safety.  Will continue to monitor and assess.

## 2016-07-29 NOTE — Progress Notes (Signed)
Recreation Therapy Notes  Date: 07/29/16 Time: 0930 Location: [address] Dayroom  Group Topic: Stress Management  Goal Area(s) Addresses:  Patient will verbalize importance of using healthy stress management.  Patient will identify positive emotions associated with healthy stress management.   Intervention: Stress Management  Activity :  Guided Imagery.  LRT introduced the stress management technique of guided imagery.  LRT read a script to guide patients and engage them in the technique.  Patients were to follow along as LRT read script to fully participate in the technique.  Education:  Stress Management, Discharge Planning.   Education Outcome: Acknowledges edcuation/In group clarification offered/Needs additional education  Clinical Observations/Feedback: Pt did not attend group.   Briceyda Abdullah, LRT/CTRS         Tina [name] A 07/29/2016 12:26 PM 

## 2016-07-29 NOTE — Plan of Care (Signed)
Problem: Activity: Goal: Sleeping patterns will improve Outcome: Progressing Pt slept 6.75 hours last night according to flowsheet.    

## 2016-07-29 NOTE — Discharge Summary (Signed)
Physician Discharge Summary Note  Patient:  Tina FoleyLaurie Kristin Slevin is an 46 y.o., female MRN:  [id number] DOB:  [birth year] Patient phone:  [phone] (home)  Patient address:   [address] [place] [id number],  Total Time spent with patient: 30 minutes  Date of Admission:  07/12/2016 Date of Discharge: 07/29/2016 Reason for Admission: 46 year old female, known to our unit from prior admissions, has history of Bipolar Disorder. [facility] for depression and suicidal ideations. She was diagnosed with Bipolar Disorder, Depressed, and was discharged with improved mood and affect on Lamictal and Effexor XR .She attempted suicide on 4/27 by overdosing on Tegretol , which she was not currently prescribed but  had left from a prior medication trial . Patient states she remembers feeling more depressed, overwhelmed, having suicidal ideations, but her recollection of overdose is limited/ fragmented . She was found confused and barely responsive by family members, and was brought to ED, requiring medical admission to stabilize . Patient reports she continues to feel depressed, overwhelmed, particularly about financial difficulties, but at this time denies any active suicidal ideations, and is able to identify her children as a protective factor against suicide . Denies hallucinations . Associated Signs/Symptoms: Depression Symptoms:  depressed mood, feelings of worthlessness/guilt, suicidal attempt, anxiety, loss of energy/fatigue, (Hypo) Manic Symptoms:  none at this time  Anxiety Symptoms:  anxious ruminations, mainly about financial difficulties  Psychotic Symptoms:  denies  PTSD Symptoms: does not endorse  Total Time spent with patient: 45 minutes  Past Psychiatric History: reports history of bipolar disorder. History of prior psychiatric admissions, most recently earlier in April, 2018, as above. History of prior suicidal ideations and attempts,  most recently in 2016, by overdosing . No history of self cutting, no history of psychosis. States that she feels Lamictal has helped the best for her.   Previous Psychotropic Medications: Most recently was on Lamictal, Effexor XR . States Lamictal has worked in the past, was recently restarted but has not yet titrated to prior doses . States Marlon PelSymbiax was helpful and felt better, but also gained 50 lbs and expresses reluctance to try this medication or any other medication often associated with weight gain.  Principal Problem: Bipolar 1 disorder, mixed [facility](HCC) Discharge Diagnoses: Patient Active Problem List   Diagnosis Date Noted  . Rash and nonspecific skin eruption [R21] 07/14/2016  . Tegretol toxicity [T42.1X1A] 07/09/2016  . Electrolyte imbalance [E87.8] 07/09/2016  . Bipolar I disorder, most recent episode depressed (HCC) [F31.30] 06/19/2016  . Bipolar 1 disorder, manic, full remission (HCC) [F31.74] 06/18/2016  . Migraine [G43.909] 02/20/2015  . Affective psychosis, bipolar (HCC) [F31.9]   . Bipolar disorder, current episode depressed, severe, without psychotic features (HCC) [F31.4]   . Bipolar affective disorder, current episode severe (HCC) [F31.9] 12/02/2014  . Bipolar I disorder, current or most recent episode depressed, in partial remission (HCC) [F31.75] 07/11/2014  . Bipolar 1 disorder, mixed (HCC) [F31.60] 06/27/2014  . Disease of thyroid gland [E07.9] 06/27/2014  . Anxiety [F41.9] 06/27/2014  . 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 Bipolar 1 disorder, depressed, partial remission (HCC) [F31.75] 06/27/2014  . 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 Alcohol use disorder, severe, dependence (HCC) [F10.20] 06/24/2014  . Bipolar disorder, unspecified (HCC) [F31.9] 02/16/2013  . Adrenal insufficiency (HCC) [E27.40]   . Acne [L70.9]   . Hx of Lyme disease [Z86.19]   . 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 Bipolar I disorder, most recent episode (or current) manic (HCC) [F31.10] 08/29/2012  . Hypothyroidism [E03.9] 12/23/2010  . Myalgia [M79.1] 12/23/2010  . Fatigue [R53.83] 12/23/2010  . Unspecified vitamin deficiency [E56.9] 12/23/2010  . Depression [F32.9]      Past Medical History:  Past Medical History:  Diagnosis Date  . Acne   . Adrenal insufficiency (HCC)   . Bipolar 2 disorder (HCC)   . Cellulitis   . Depression   . Genital warts   . Headache   . History of chicken pox   . Hx of Lyme disease   . Hypothyroidism     Past Surgical History:  Procedure Laterality Date  . CESAREAN SECTION  2003, 2007, 2011  . INGUINAL HERNIA REPAIR Bilateral   . TONSILLECTOMY    . UMBILICAL HERNIA REPAIR     Family History:  Family History  Problem Relation Age of Onset  . Other Mother        PAD  . Heart disease Other        Grandparent  . Alcohol abuse Paternal Grandmother   . Mental retardation Paternal Grandmother   . Arthritis Maternal Grandfather   . Heart disease Maternal Grandfather    Family Psychiatric  History:  mother supportive , reports history of depression and alcohol abuse in extended family   Social History:  History  Alcohol Use  . Yes    Comment: social drinker-few drinks a week      History  Drug Use No    Social History   Social History  . Marital status: Single    Spouse name: N/A  . Number of children: N/A  . Years of education: 46   Occupational History  . HOMEMAKER    Social History Main Topics  . Smoking status: Current Every Day Smoker    Packs/day: 1.50    Years: 29.00    Types: Cigarettes  . Smokeless tobacco: Never Used  . Alcohol use Yes     Comment: social drinker-few drinks a week   . Drug use: No  . Sexual activity: Not Currently   Other Topics Concern  . None   Social History Narrative   Regular exercise-no    Hospital Course:  Giannamarie Paulus was admitted for Bipolar 1 disorder, mixed  (HCC)  and crisis management.  Pt was treated discharged with the medications listed below under Medication List.  Medical problems were identified and treated as needed.  Home medications were restarted as appropriate.  Improvement was monitored by observation and Tina [name] 's daily report of symptom reduction.  Emotional and mental status was monitored by daily self-inventory reports completed by Tina [name] and clinical staff.         Tina [name] was evaluated by the treatment team for stability and plans for continued recovery upon discharge. Tina [name] 's motivation was an integral factor for scheduling further treatment. Employment, transportation, bed availability, health status, family support, and any pending legal issues were also considered during hospital stay. Pt was offered further treatment options upon discharge including but not limited to Residential, Intensive Outpatient, and Outpatient treatment.  Tina [name] will follow up with the services as listed below under Follow Up Information.     Upon completion of this admission the patient was both mentally and medically stable for discharge denying suicidal/homicidal ideation, auditory/visual/tactile hallucinations, delusional thoughts and paranoia.     Pertinent labs were obtained during recent admission  to include: Cortisol, BMP and TSH for which outpatient follow-up is necessary for lab recheck as mentioned below. Reviewed CBC, CMP with abnormal AST, magnesium 1.6, Calcium Ionized 1.01, CK total 301. BAL negative , and UDS positive for Benzodiazepines; all unremarkable aside from noted exceptions.   Physical Findings: AIMS: Facial and Oral Movements Muscles of Facial Expression: None, normal Lips and Perioral Area: None, normal Jaw: None, normal Tongue: None, normal,Extremity Movements Upper (arms, wrists, hands, fingers): None, normal Lower (legs, knees, ankles, toes): None, normal,  Trunk Movements Neck, shoulders, hips: None, normal, Overall Severity Severity of abnormal movements (highest score from questions above): None, normal Incapacitation due to abnormal movements: None, normal Patient's awareness of abnormal movements (rate only patient's report): No Awareness, Dental Status Current problems with teeth and/or dentures?: No Does patient usually wear dentures?: No  CIWA:  CIWA-Ar Total: 1 COWS:  COWS Total Score: 2  Musculoskeletal: Strength & Muscle Tone: within normal limits Gait & Station: normal Patient leans: N/A  Psychiatric Specialty Exam: See SRA by MD  Physical Exam  Nursing note and vitals reviewed. Constitutional: She is oriented to person, place, and time. She appears well-developed.  Neurological: She is alert and oriented to person, place, and time.  Psychiatric: She has a normal mood and affect. Her behavior is normal.    Review of Systems  Psychiatric/Behavioral: Negative for depression (stable). The patient is not nervous/anxious (stable).     Blood pressure (!) 91/53, pulse 76, temperature 97.6 F (36.4 C), temperature source Oral, resp. rate 18, height 5' (1.524 m), weight 53.1 kg (117 lb), SpO2 98 %.Body mass index is 22.85 kg/m.   Have you used any form of tobacco in the last 30 days? (Cigarettes, Smokeless Tobacco, Cigars, and/or Pipes): Yes  Has this patient used any form of tobacco in the last 30 days? (Cigarettes, Smokeless Tobacco, Cigars, and/or Pipes)  No  Blood Alcohol level:  Lab Results  Component Value Date   ETH <5 07/08/2016   ETH <5 06/18/2016    Metabolic Disorder Labs:  Lab Results  Component Value Date   HGBA1C 5.1 12/03/2014   MPG 100 12/03/2014   MPG 100 12/02/2014   No results found for: PROLACTIN Lab Results  Component Value Date   CHOL 195 12/03/2014   TRIG 122 12/03/2014   HDL 42 12/03/2014   CHOLHDL 4.6 12/03/2014   VLDL 24 12/03/2014   LDLCALC 129 (H) 12/03/2014    See Psychiatric  Specialty Exam and Suicide Risk Assessment completed by Attending Physician prior to discharge.  Discharge destination:  Home  Is patient on multiple antipsychotic therapies at discharge:  No   Has Patient had three or more failed trials of antipsychotic monotherapy by history:  No  Recommended Plan for Multiple Antipsychotic Therapies: NA  Discharge Instructions    Discharge instructions    Complete by:  As directed    Discharge patient    Complete by:  As directed    Discharge disposition:  01-Home or Self Care   Discharge patient date:  07/29/2016     Allergies as of 07/29/2016      Reactions   Stadol [butorphanol] Other (See Comments)   Dependence   Vancomycin Itching   Penicillins Hives, Rash   Sulfa Antibiotics Rash      Medication List    STOP taking these medications   lamoTRIgine 25 MG tablet Commonly known as:  LAMICTAL   mupirocin cream 2 % Commonly known as:  BACTROBAN   venlafaxine XR  75 MG 24 hr capsule Commonly known as:  EFFEXOR-XR     TAKE these medications     Indication  ARIPiprazole 2 MG tablet Commonly known as:  ABILIFY Take 3.5 tablets (7 mg total) by mouth every morning. Start taking on:  07/30/2016  Indication:  Major Depressive Disorder, Manic Phase of Manic-Depression   BuPROPion HCl ER (XL) 450 MG Tb24 Take 450 mg by mouth daily. Start taking on:  07/30/2016  Indication:  Major Depressive Disorder   hydrOXYzine 25 MG tablet Commonly known as:  ATARAX/VISTARIL Take 1 tablet (25 mg total) by mouth 3 (three) times daily as needed for anxiety.  Indication:  Anxiety Neurosis   sertraline 50 MG tablet Commonly known as:  ZOLOFT Take 1 tablet (50 mg total) by mouth daily. Start taking on:  07/30/2016  Indication:  Major Depressive Disorder   thyroid 90 MG tablet Commonly known as:  ARMOUR Take 1 tablet (90 mg total) by mouth daily before breakfast. Start taking on:  07/30/2016  Indication:  Underactive Thyroid   traZODone 50 MG  tablet Commonly known as:  DESYREL Take 1 tablet (50 mg total) by mouth at bedtime as needed for sleep.  Indication:  Trouble Sleeping      Follow-up Information    [facility] Follow up.   Specialty:  Behavioral Health Why:  5/23 at 10:00am with [facility] for therapy. 5/29 at 2:30pm for medication management with Dr. Mila Homer. Please call the office if you need to cancel/reschedule your appointments. Contact information: [address] [place] [address] [phone]        [facility] Follow up.   Specialty:  Behavioral Health Why:  Patient will be followed by Transitional Care Team, services to begin on day of discharge.   [address] [place] [address] [phone]           Follow-up recommendations:  Activity:  as tolerated Diet:  heart healthy Tests:  Cortisol level  Comments: Take all medications as prescribed. Keep all follow-up appointments as scheduled.  Do not consume alcohol or use illegal drugs while on prescription medications. Report any adverse effects from your medications to your primary care provider promptly.  In the event of recurrent symptoms or worsening symptoms, call 911, a crisis hotline, or go to the nearest emergency department for evaluation.   Signed: Truman Hayward, FNP 07/29/2016, 1:32 PM

## 2016-07-29 NOTE — Progress Notes (Signed)
Pt d/c from the hospital. All items returned. D/C instructions given and prescriptions given. Pt denies si and hi. 

## 2016-07-29 NOTE — [facility] Suicide Risk Assessment (Signed)
[facility]   Principal Problem: Bipolar 1 disorder, mixed [facility](HCC) Discharge Diagnoses:  Patient Active Problem List   Diagnosis Date Noted  . Rash and nonspecific skin eruption [R21] 07/14/2016  . Tegretol toxicity [T42.1X1A] 07/09/2016  . Electrolyte imbalance [E87.8] 07/09/2016  . Bipolar I disorder, most recent episode depressed (HCC) [F31.30] 06/19/2016  . Bipolar 1 disorder, manic, full remission (HCC) [F31.74] 06/18/2016  . Migraine [G43.909] 02/20/2015  . Affective psychosis, bipolar (HCC) [F31.9]   . Bipolar disorder, current episode depressed, severe, without psychotic features (HCC) [F31.4]   . Bipolar affective disorder, current episode severe (HCC) [F31.9] 12/02/2014  . Bipolar I disorder, current or most recent episode depressed, in partial remission (HCC) [F31.75] 07/11/2014  . Bipolar 1 disorder, mixed (HCC) [F31.60] 06/27/2014  . Disease of thyroid gland [E07.9] 06/27/2014  . Anxiety [F41.9] 06/27/2014  . 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 Bipolar 1 disorder, depressed, partial remission (HCC) [F31.75] 06/27/2014  . 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 Alcohol use disorder, severe, dependence (HCC) [F10.20] 06/24/2014  . Bipolar disorder, unspecified (HCC) [F31.9] 02/16/2013  . Adrenal insufficiency (HCC) [E27.40]   . Acne [L70.9]   . Hx of Lyme disease [Z86.19]   . 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 Bipolar I disorder, most recent episode (or current) manic (HCC) [F31.10] 08/29/2012  . Hypothyroidism [E03.9] 12/23/2010  . Myalgia [M79.1] 12/23/2010  . Fatigue [R53.83] 12/23/2010  . Unspecified vitamin deficiency [E56.9] 12/23/2010  . Depression [F32.9]     Total Time spent with patient: 30 minutes  Musculoskeletal: Strength & Muscle Tone: within normal limits Gait & Station: normal Patient leans: N/A  Psychiatric Specialty  Exam: ROS mild lightheadedness , no chest pain , no shortness of breath, no fever  Blood pressure (!) 91/53, pulse 76, temperature 97.6 F (36.4 C), temperature source Oral, resp. rate 18, height 5' (1.524 m), weight 53.1 kg (117 lb), SpO2 98 %.Body mass index is 22.85 kg/m.  General Appearance: improved grooming   Eye Contact::  Good  Speech:  Normal Rate409  Volume:  Normal  Mood:  improving mood, less depressed, states she is feeling better  Affect:  more reactive, smiles at times appropriately   Thought Process:  Linear and Descriptions of Associations: Intact  Orientation:  Full (Time, Place, and Person)  Thought Content:  denies any hallucinations, no delusions  Suicidal Thoughts:  No denies any suicidal or self injurious ideations  Homicidal Thoughts:  No denies any homicidal ideations   Memory:  recent and remote grossly intact   Judgement:  Other:  improving   Insight:  improving   Psychomotor Activity:  Normal  Concentration:  Good  Recall:  Good  Fund of Knowledge:Good  Language: Good  Akathisia:  Negative  Handed:  Right  AIMS (if indicated):     Assets:  Communication Skills Desire for Improvement Resilience Social Support  Sleep:  Number of Hours: 6.75  Cognition: WNL  ADL's:  Intact   Mental Status Per Nursing Assessment::   On Admission:  Suicidal ideation indicated by patient  Demographic Factors:  46 year old female, divorced, unemployed, was living alone, but plans to go live with her mother after discharge.  Loss Factors: Unemployment, financial difficulties   Historical Factors: Long history of mood disorder, has been diagnosed with Bipolar Disorder, history of suicide attempts   Risk Reduction Factors:   Responsible for children under 44 years of age, Sense of  responsibility to family, Living with another person, especially a relative and Positive coping skills or problem solving skills  Continued Clinical Symptoms:  Patient is presenting with  improvement compared to admission . She presents alert, attentive, well related, mood is improved, affect is more reactive, she is better related, less withdrawn , and more communicative, no thought disorder, denies any current suicidal or self injurious ideations, no homicidal or violent ideations.  She denies any medication side effects. Behavior on unit in good control, visible in day room, more interactive.  At patient's request spoke with her mother via phone- mother corroborates that patient is improved , and is agreeing for patient to go live with her for added support .  Reviewed medications, medication side effects and follow up recommendations with patient, mother. Patient has worked on suicide prevention plan, has identified mother as major support, has identified love for children as protective factor against suicide.   Cognitive Features That Contribute To Risk:  No gross cognitive deficits noted upon discharge. Is alert , attentive, and oriented x 3   Suicide Risk:  Moderate:  Frequent suicidal ideation with limited intensity, and duration, some specificity in terms of plans, no associated intent, good self-control, limited dysphoria/symptomatology, some risk factors present, and identifiable protective factors, including available and accessible social support.  Follow-up Information    [facility] Follow up.   Specialty:  Behavioral Health Why:  5/23 at 10:00am with [facility] for therapy. 5/29 at 2:30pm for medication management with Dr. Mila Homer. Please call the office if you need to cancel/reschedule your appointments. Contact information: [address] [place] [address] [phone]        [facility] Follow up.   Specialty:  Behavioral Health Why:  Patient will be followed by Transitional Care Team, services to begin on day of discharge.   Contact information: [address] [place] [address] [phone]         Plan Of Care/Follow-up recommendations:   Activity:  as tolerated  Diet:  Regular Tests:  NA Other:  See below Patient reports feeling better and ready for discharge today- going to live with her mother for added support  Mother picking her up later today Patient will follow up as above  She will also follow up with her endocrinologist , for further work up regarding possible adrenal insufficiency and treatment  Craige Cotta, MD 07/29/2016, 11:31 AM

## 2017-07-31 DIAGNOSIS — R87610 Atypical squamous cells of undetermined significance on cytologic smear of cervix (ASC-US): Secondary | ICD-10-CM | POA: Insufficient documentation

## 2017-07-31 DIAGNOSIS — R87810 Cervical high risk human papillomavirus (HPV) DNA test positive: Secondary | ICD-10-CM

## 2017-11-01 IMAGING — CR DG CHEST 1V PORT
1 series · 1 of 1 positions shown · non-contrast
Comparison: Chest radiograph dated 03/25/2016

CLINICAL DATA: 45-year-old female with altered mental status

EXAM:
PORTABLE CHEST 1 VIEW

[AP]
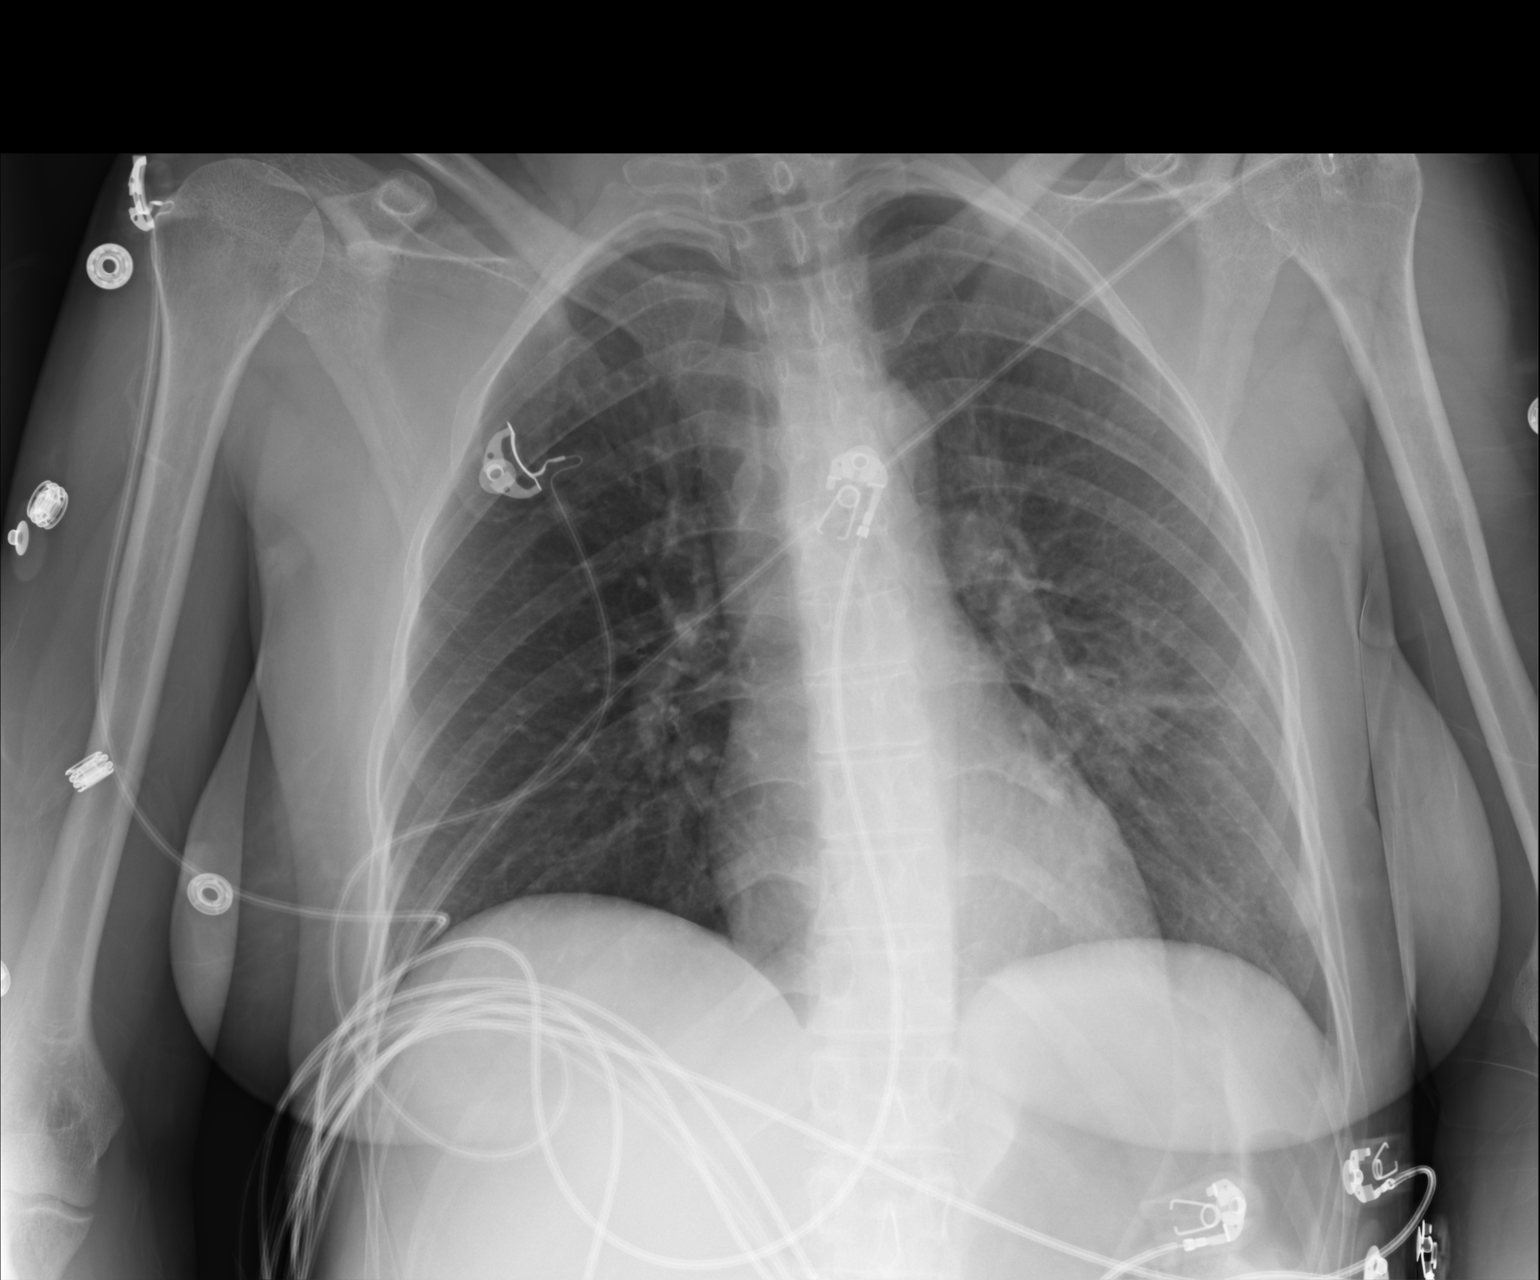

[1 of 1 positions shown; findings below may reference images not displayed]

FINDINGS: The heart size and mediastinal contours are within normal limits.
Both lungs are clear. The visualized skeletal structures are
unremarkable.
IMPRESSION: No active disease.

## 2018-04-03 ENCOUNTER — Other Ambulatory Visit: Payer: Self-pay

## 2018-04-03 ENCOUNTER — Inpatient Hospital Stay (HOSPITAL_COMMUNITY)
Admission: EM | Admit: 2018-04-03 | Discharge: 2018-04-09 | DRG: 885 | Payer: Medicaid Other | Attending: Internal Medicine | Admitting: Internal Medicine

## 2018-04-03 ENCOUNTER — Encounter (HOSPITAL_COMMUNITY): Payer: Self-pay

## 2018-04-03 DIAGNOSIS — T43212A Poisoning by selective serotonin and norepinephrine reuptake inhibitors, intentional self-harm, initial encounter: Secondary | ICD-10-CM | POA: Diagnosis present

## 2018-04-03 DIAGNOSIS — T450X2A Poisoning by antiallergic and antiemetic drugs, intentional self-harm, initial encounter: Secondary | ICD-10-CM | POA: Diagnosis present

## 2018-04-03 DIAGNOSIS — F32A Depression, unspecified: Secondary | ICD-10-CM | POA: Diagnosis present

## 2018-04-03 DIAGNOSIS — T43292A Poisoning by other antidepressants, intentional self-harm, initial encounter: Secondary | ICD-10-CM | POA: Diagnosis present

## 2018-04-03 DIAGNOSIS — F1020 Alcohol dependence, uncomplicated: Secondary | ICD-10-CM | POA: Diagnosis present

## 2018-04-03 DIAGNOSIS — I4581 Long QT syndrome: Secondary | ICD-10-CM | POA: Diagnosis present

## 2018-04-03 DIAGNOSIS — T50904A Poisoning by unspecified drugs, medicaments and biological substances, undetermined, initial encounter: Secondary | ICD-10-CM

## 2018-04-03 DIAGNOSIS — Z79899 Other long term (current) drug therapy: Secondary | ICD-10-CM

## 2018-04-03 DIAGNOSIS — T50901A Poisoning by unspecified drugs, medicaments and biological substances, accidental (unintentional), initial encounter: Secondary | ICD-10-CM | POA: Diagnosis present

## 2018-04-03 DIAGNOSIS — Z810 Family history of intellectual disabilities: Secondary | ICD-10-CM

## 2018-04-03 DIAGNOSIS — Y900 Blood alcohol level of less than 20 mg/100 ml: Secondary | ICD-10-CM | POA: Diagnosis present

## 2018-04-03 DIAGNOSIS — F3160 Bipolar disorder, current episode mixed, unspecified: Secondary | ICD-10-CM | POA: Diagnosis present

## 2018-04-03 DIAGNOSIS — F419 Anxiety disorder, unspecified: Secondary | ICD-10-CM | POA: Diagnosis not present

## 2018-04-03 DIAGNOSIS — T391X2A Poisoning by 4-Aminophenol derivatives, intentional self-harm, initial encounter: Secondary | ICD-10-CM | POA: Diagnosis present

## 2018-04-03 DIAGNOSIS — Z881 Allergy status to other antibiotic agents status: Secondary | ICD-10-CM

## 2018-04-03 DIAGNOSIS — T510X2A Toxic effect of ethanol, intentional self-harm, initial encounter: Secondary | ICD-10-CM | POA: Diagnosis present

## 2018-04-03 DIAGNOSIS — R339 Retention of urine, unspecified: Secondary | ICD-10-CM | POA: Diagnosis present

## 2018-04-03 DIAGNOSIS — E039 Hypothyroidism, unspecified: Secondary | ICD-10-CM | POA: Diagnosis present

## 2018-04-03 DIAGNOSIS — F29 Unspecified psychosis not due to a substance or known physiological condition: Secondary | ICD-10-CM | POA: Diagnosis present

## 2018-04-03 DIAGNOSIS — Z818 Family history of other mental and behavioral disorders: Secondary | ICD-10-CM

## 2018-04-03 DIAGNOSIS — F17200 Nicotine dependence, unspecified, uncomplicated: Secondary | ICD-10-CM | POA: Diagnosis present

## 2018-04-03 DIAGNOSIS — E2740 Unspecified adrenocortical insufficiency: Secondary | ICD-10-CM | POA: Diagnosis present

## 2018-04-03 DIAGNOSIS — Z880 Allergy status to penicillin: Secondary | ICD-10-CM

## 2018-04-03 DIAGNOSIS — M25519 Pain in unspecified shoulder: Secondary | ICD-10-CM | POA: Diagnosis present

## 2018-04-03 DIAGNOSIS — F418 Other specified anxiety disorders: Secondary | ICD-10-CM | POA: Diagnosis present

## 2018-04-03 DIAGNOSIS — F319 Bipolar disorder, unspecified: Secondary | ICD-10-CM | POA: Diagnosis not present

## 2018-04-03 DIAGNOSIS — F329 Major depressive disorder, single episode, unspecified: Secondary | ICD-10-CM | POA: Diagnosis present

## 2018-04-03 DIAGNOSIS — Z915 Personal history of self-harm: Secondary | ICD-10-CM

## 2018-04-03 DIAGNOSIS — T1491XA Suicide attempt, initial encounter: Secondary | ICD-10-CM

## 2018-04-03 DIAGNOSIS — F3181 Bipolar II disorder: Secondary | ICD-10-CM | POA: Diagnosis present

## 2018-04-03 DIAGNOSIS — F17210 Nicotine dependence, cigarettes, uncomplicated: Secondary | ICD-10-CM | POA: Diagnosis present

## 2018-04-03 DIAGNOSIS — Z882 Allergy status to sulfonamides status: Secondary | ICD-10-CM

## 2018-04-03 DIAGNOSIS — Z885 Allergy status to narcotic agent status: Secondary | ICD-10-CM

## 2018-04-03 DIAGNOSIS — M542 Cervicalgia: Secondary | ICD-10-CM | POA: Diagnosis present

## 2018-04-03 DIAGNOSIS — F1021 Alcohol dependence, in remission: Secondary | ICD-10-CM | POA: Diagnosis present

## 2018-04-03 DIAGNOSIS — Z8619 Personal history of other infectious and parasitic diseases: Secondary | ICD-10-CM

## 2018-04-03 DIAGNOSIS — R079 Chest pain, unspecified: Secondary | ICD-10-CM | POA: Diagnosis present

## 2018-04-03 DIAGNOSIS — Z630 Problems in relationship with spouse or partner: Secondary | ICD-10-CM

## 2018-04-03 DIAGNOSIS — Z720 Tobacco use: Secondary | ICD-10-CM

## 2018-04-03 LAB — ACETAMINOPHEN LEVEL: Acetaminophen (Tylenol), Serum: 17 ug/mL (ref 10–30)

## 2018-04-03 LAB — COMPREHENSIVE METABOLIC PANEL
ALT: 18 U/L (ref 0–44)
ANION GAP: 7 (ref 5–15)
AST: 24 U/L (ref 15–41)
Albumin: 3.6 g/dL (ref 3.5–5.0)
Alkaline Phosphatase: 63 U/L (ref 38–126)
BUN: 15 mg/dL (ref 6–20)
CHLORIDE: 105 mmol/L (ref 98–111)
CO2: 26 mmol/L (ref 22–32)
CREATININE: 0.64 mg/dL (ref 0.44–1.00)
Calcium: 8.9 mg/dL (ref 8.9–10.3)
Glucose, Bld: 85 mg/dL (ref 70–99)
Potassium: 4 mmol/L (ref 3.5–5.1)
SODIUM: 138 mmol/L (ref 135–145)
Total Bilirubin: 0.5 mg/dL (ref 0.3–1.2)
Total Protein: 6.5 g/dL (ref 6.5–8.1)

## 2018-04-03 LAB — CBC
HCT: 41.2 % (ref 36.0–46.0)
Hemoglobin: 13.6 g/dL (ref 12.0–15.0)
MCH: 33.6 pg (ref 26.0–34.0)
MCHC: 33 g/dL (ref 30.0–36.0)
MCV: 101.7 fL — ABNORMAL HIGH (ref 80.0–100.0)
NRBC: 0 % (ref 0.0–0.2)
Platelets: 271 10*3/uL (ref 150–400)
RBC: 4.05 MIL/uL (ref 3.87–5.11)
RDW: 11.6 % (ref 11.5–15.5)
WBC: 8 10*3/uL (ref 4.0–10.5)

## 2018-04-03 LAB — SALICYLATE LEVEL

## 2018-04-03 LAB — ETHANOL: Alcohol, Ethyl (B): <10 mg/dL (ref <10)

## 2018-04-03 LAB — HCG, QUANTITATIVE, PREGNANCY: hCG, Beta Chain, Quant, S: 3 m[IU]/mL (ref <5)

## 2018-04-03 MED ORDER — LORAZEPAM 1 MG PO TABS
1.0000 mg | ORAL_TABLET | Freq: Four times a day (QID) | ORAL | Status: AC | PRN
  Administered 2018-04-06: 1 mg via ORAL
  Filled 2018-04-03: qty 1

## 2018-04-03 MED ORDER — LORAZEPAM 2 MG/ML IJ SOLN
1.0000 mg | INTRAMUSCULAR | Status: DC | PRN
  Filled 2018-04-03: qty 1

## 2018-04-03 MED ORDER — NICOTINE 21 MG/24HR TD PT24
21.0000 mg | MEDICATED_PATCH | Freq: Every day | TRANSDERMAL | Status: DC
  Administered 2018-04-07 – 2018-04-09 (×3): 21 mg via TRANSDERMAL
  Filled 2018-04-03 (×3): qty 1

## 2018-04-03 MED ORDER — LORAZEPAM 2 MG/ML IJ SOLN
0.0000 mg | Freq: Four times a day (QID) | INTRAMUSCULAR | Status: AC
  Administered 2018-04-05: 2 mg via INTRAVENOUS
  Filled 2018-04-03: qty 1

## 2018-04-03 MED ORDER — THIAMINE HCL 100 MG/ML IJ SOLN
100.0000 mg | Freq: Every day | INTRAMUSCULAR | Status: DC
  Administered 2018-04-05: 100 mg via INTRAVENOUS
  Filled 2018-04-03: qty 2

## 2018-04-03 MED ORDER — SODIUM CHLORIDE 0.9 % IV SOLN
Freq: Once | INTRAVENOUS | Status: AC
  Administered 2018-04-03: 22:00:00 via INTRAVENOUS

## 2018-04-03 MED ORDER — ONDANSETRON HCL 4 MG PO TABS: 4.0000 mg | ORAL_TABLET | Freq: Four times a day (QID) | ORAL | Status: DC | PRN

## 2018-04-03 MED ORDER — SODIUM CHLORIDE 0.9 % IV BOLUS
1000.0000 mL | Freq: Once | INTRAVENOUS | Status: AC
  Administered 2018-04-03: 1000 mL via INTRAVENOUS

## 2018-04-03 MED ORDER — PANTOPRAZOLE SODIUM 40 MG PO TBEC: 40.0000 mg | DELAYED_RELEASE_TABLET | Freq: Every day | ORAL | Status: DC

## 2018-04-03 MED ORDER — ENOXAPARIN SODIUM 40 MG/0.4ML ~~LOC~~ SOLN
40.0000 mg | Freq: Every day | SUBCUTANEOUS | Status: DC
  Administered 2018-04-04 – 2018-04-08 (×6): 40 mg via SUBCUTANEOUS
  Filled 2018-04-03 (×6): qty 0.4

## 2018-04-03 MED ORDER — THYROID 120 MG PO TABS
120.0000 mg | ORAL_TABLET | Freq: Every day | ORAL | Status: DC
  Filled 2018-04-03: qty 1

## 2018-04-03 MED ORDER — ADULT MULTIVITAMIN W/MINERALS CH: 1.0000 | ORAL_TABLET | Freq: Every day | ORAL | Status: DC

## 2018-04-03 MED ORDER — LORAZEPAM 2 MG/ML IJ SOLN
0.0000 mg | Freq: Two times a day (BID) | INTRAMUSCULAR | Status: AC
  Administered 2018-04-07 (×2): 1 mg via INTRAVENOUS
  Filled 2018-04-03: qty 1

## 2018-04-03 MED ORDER — FOLIC ACID 1 MG PO TABS: 1.0000 mg | ORAL_TABLET | Freq: Every day | ORAL | Status: DC

## 2018-04-03 MED ORDER — ONDANSETRON HCL 4 MG/2ML IJ SOLN
4.0000 mg | Freq: Four times a day (QID) | INTRAMUSCULAR | Status: DC | PRN
  Administered 2018-04-06: 4 mg via INTRAVENOUS
  Filled 2018-04-03: qty 2

## 2018-04-03 MED ORDER — SENNOSIDES-DOCUSATE SODIUM 8.6-50 MG PO TABS: 1.0000 | ORAL_TABLET | Freq: Every evening | ORAL | Status: DC | PRN

## 2018-04-03 MED ORDER — LIOTHYRONINE SODIUM 25 MCG PO TABS
25.0000 ug | ORAL_TABLET | Freq: Every day | ORAL | Status: DC
  Filled 2018-04-03: qty 1

## 2018-04-03 MED ORDER — VITAMIN B-1 100 MG PO TABS: 100.0000 mg | ORAL_TABLET | Freq: Every day | ORAL | Status: DC

## 2018-04-03 MED ORDER — LORAZEPAM 2 MG/ML IJ SOLN
1.0000 mg | Freq: Four times a day (QID) | INTRAMUSCULAR | Status: AC | PRN
  Administered 2018-04-05: 1 mg via INTRAVENOUS
  Filled 2018-04-03: qty 1
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
## 2018-04-03 NOTE — ED Notes (Signed)
Pt placed on purewick 

## 2018-04-03 NOTE — ED Notes (Signed)
Called PC --- patti  Wellbutrin suggest 24 admit and full cardiac monitoring from time of ingestion at 1pm  Look for seizures, conduction delays and QRS elongation. HTN and Hypotension  With mixture of Nyquil and beer. Watch for tachycardia And respiratory depression.  Supportive care waiting blood levels and EKG

## 2018-04-03 NOTE — ED Notes (Signed)
Bed: [id number] Expected date:  Expected time:  Means of arrival:  Comments: Hold for OD/trazadone
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
## 2018-04-04 ENCOUNTER — Other Ambulatory Visit: Payer: Self-pay

## 2018-04-04 DIAGNOSIS — F1020 Alcohol dependence, uncomplicated: Secondary | ICD-10-CM

## 2018-04-04 DIAGNOSIS — T50902A Poisoning by unspecified drugs, medicaments and biological substances, intentional self-harm, initial encounter: Secondary | ICD-10-CM | POA: Diagnosis not present

## 2018-04-04 DIAGNOSIS — E039 Hypothyroidism, unspecified: Secondary | ICD-10-CM

## 2018-04-04 DIAGNOSIS — R410 Disorientation, unspecified: Secondary | ICD-10-CM | POA: Diagnosis not present

## 2018-04-04 LAB — BASIC METABOLIC PANEL
Anion gap: 8 (ref 5–15)
BUN: 14 mg/dL (ref 6–20)
CO2: 22 mmol/L (ref 22–32)
Calcium: 8 mg/dL — ABNORMAL LOW (ref 8.9–10.3)
Chloride: 109 mmol/L (ref 98–111)
Creatinine, Ser: 0.59 mg/dL (ref 0.44–1.00)
GFR calc Af Amer: >60 mL/min (ref >60)
GFR calc non Af Amer: >60 mL/min (ref >60)
GLUCOSE: 85 mg/dL (ref 70–99)
POTASSIUM: 3.9 mmol/L (ref 3.5–5.1)
Sodium: 139 mmol/L (ref 135–145)

## 2018-04-04 LAB — RAPID URINE DRUG SCREEN, HOSP PERFORMED
Amphetamines: POSITIVE — AB
BARBITURATES: NOT DETECTED
BENZODIAZEPINES: POSITIVE — AB
COCAINE: NOT DETECTED
Opiates: NOT DETECTED
TETRAHYDROCANNABINOL: POSITIVE — AB

## 2018-04-04 LAB — ACETAMINOPHEN LEVEL: Acetaminophen (Tylenol), Serum: <10 ug/mL — ABNORMAL LOW (ref 10–30)

## 2018-04-04 LAB — CBC
HCT: 39.6 % (ref 36.0–46.0)
Hemoglobin: 12.9 g/dL (ref 12.0–15.0)
MCH: 33.2 pg (ref 26.0–34.0)
MCHC: 32.6 g/dL (ref 30.0–36.0)
MCV: 102.1 fL — ABNORMAL HIGH (ref 80.0–100.0)
Platelets: 257 10*3/uL (ref 150–400)
RBC: 3.88 MIL/uL (ref 3.87–5.11)
RDW: 11.6 % (ref 11.5–15.5)
WBC: 5 10*3/uL (ref 4.0–10.5)
nRBC: 0 % (ref 0.0–0.2)

## 2018-04-04 LAB — TSH: TSH: 0.042 u[IU]/mL — ABNORMAL LOW (ref 0.350–4.500)

## 2018-04-04 LAB — MAGNESIUM: Magnesium: 2 mg/dL (ref 1.7–2.4)

## 2018-04-04 LAB — T4, FREE: Free T4: 0.46 ng/dL — ABNORMAL LOW (ref 0.82–1.77)

## 2018-04-04 LAB — TROPONIN I: Troponin I: <0.03 ng/mL (ref <0.03)

## 2018-04-04 LAB — HIV ANTIBODY (ROUTINE TESTING W REFLEX): HIV SCREEN 4TH GENERATION: NONREACTIVE

## 2018-04-04 MED ORDER — FOLIC ACID 5 MG/ML IJ SOLN
1.0000 mg | Freq: Every day | INTRAMUSCULAR | Status: DC
  Administered 2018-04-04: 1 mg via INTRAVENOUS
  Filled 2018-04-04 (×2): qty 0.2

## 2018-04-04 MED ORDER — LIP MEDEX EX OINT
TOPICAL_OINTMENT | CUTANEOUS | Status: DC | PRN
  Administered 2018-04-04: 1 via TOPICAL
  Filled 2018-04-04: qty 7

## 2018-04-04 MED ORDER — HYDRALAZINE HCL 20 MG/ML IJ SOLN
5.0000 mg | INTRAMUSCULAR | Status: DC | PRN
  Administered 2018-04-04: 5 mg via INTRAVENOUS
  Filled 2018-04-04 (×2): qty 1

## 2018-04-04 MED ORDER — POTASSIUM CHLORIDE 10 MEQ/100ML IV SOLN
10.0000 meq | INTRAVENOUS | Status: AC
  Administered 2018-04-04 (×2): 10 meq via INTRAVENOUS
  Filled 2018-04-04 (×2): qty 100

## 2018-04-04 MED ORDER — SODIUM CHLORIDE 0.45 % IV SOLN
INTRAVENOUS | Status: DC
  Administered 2018-04-04 – 2018-04-06 (×3): via INTRAVENOUS
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
## 2018-04-04 NOTE — Consult Note (Signed)
Attempted to see patient earlier but unable to meaningfully participate in interview secondary to sedation. Will attempt to interview again tomorrow.   Tina Beets, DO 04/04/18 3:01 PM

## 2018-04-04 NOTE — Progress Notes (Addendum)
Reported from day shift RN that patient is more alert and oriented this evening. Upon shift arrival patient alert and responding. No hallunctions reported. Patient c/o of chest pain. 9/10. Manual  BP 150/98. Will notify MD on call.
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
## 2018-04-05 ENCOUNTER — Observation Stay (HOSPITAL_COMMUNITY): Payer: Medicaid Other

## 2018-04-05 DIAGNOSIS — R079 Chest pain, unspecified: Secondary | ICD-10-CM | POA: Diagnosis not present

## 2018-04-05 DIAGNOSIS — E039 Hypothyroidism, unspecified: Secondary | ICD-10-CM | POA: Diagnosis not present

## 2018-04-05 DIAGNOSIS — F17200 Nicotine dependence, unspecified, uncomplicated: Secondary | ICD-10-CM

## 2018-04-05 DIAGNOSIS — T1491XA Suicide attempt, initial encounter: Secondary | ICD-10-CM

## 2018-04-05 DIAGNOSIS — F329 Major depressive disorder, single episode, unspecified: Secondary | ICD-10-CM

## 2018-04-05 DIAGNOSIS — T50902A Poisoning by unspecified drugs, medicaments and biological substances, intentional self-harm, initial encounter: Secondary | ICD-10-CM | POA: Diagnosis not present

## 2018-04-05 DIAGNOSIS — F3164 Bipolar disorder, current episode mixed, severe, with psychotic features: Secondary | ICD-10-CM | POA: Diagnosis not present

## 2018-04-05 LAB — TROPONIN I
Troponin I: <0.03 ng/mL (ref <0.03)
Troponin I: <0.03 ng/mL (ref <0.03)

## 2018-04-05 LAB — COMPREHENSIVE METABOLIC PANEL
ALK PHOS: 65 U/L (ref 38–126)
ALT: 15 U/L (ref 0–44)
AST: 19 U/L (ref 15–41)
Albumin: 3.8 g/dL (ref 3.5–5.0)
Anion gap: 8 (ref 5–15)
BUN: 10 mg/dL (ref 6–20)
CALCIUM: 8.8 mg/dL — AB (ref 8.9–10.3)
CO2: 24 mmol/L (ref 22–32)
CREATININE: 0.61 mg/dL (ref 0.44–1.00)
Chloride: 104 mmol/L (ref 98–111)
GFR calc Af Amer: >60 mL/min (ref >60)
GFR calc non Af Amer: >60 mL/min (ref >60)
Glucose, Bld: 122 mg/dL — ABNORMAL HIGH (ref 70–99)
Potassium: 3.7 mmol/L (ref 3.5–5.1)
Sodium: 136 mmol/L (ref 135–145)
TOTAL PROTEIN: 6.9 g/dL (ref 6.5–8.1)
Total Bilirubin: 0.8 mg/dL (ref 0.3–1.2)

## 2018-04-05 LAB — CBC
HEMATOCRIT: 38.4 % (ref 36.0–46.0)
Hemoglobin: 13 g/dL (ref 12.0–15.0)
MCH: 34 pg (ref 26.0–34.0)
MCHC: 33.9 g/dL (ref 30.0–36.0)
MCV: 100.5 fL — ABNORMAL HIGH (ref 80.0–100.0)
Platelets: 230 10*3/uL (ref 150–400)
RBC: 3.82 MIL/uL — ABNORMAL LOW (ref 3.87–5.11)
RDW: 11.7 % (ref 11.5–15.5)
WBC: 12.3 10*3/uL — ABNORMAL HIGH (ref 4.0–10.5)
nRBC: 0 % (ref 0.0–0.2)

## 2018-04-05 LAB — MAGNESIUM: Magnesium: 2 mg/dL (ref 1.7–2.4)

## 2018-04-05 MED ORDER — FOLIC ACID 1 MG PO TABS
1.0000 mg | ORAL_TABLET | Freq: Every day | ORAL | Status: DC
  Administered 2018-04-06 – 2018-04-09 (×4): 1 mg via ORAL
  Filled 2018-04-05 (×4): qty 1

## 2018-04-05 MED ORDER — THYROID 60 MG PO TABS
120.0000 mg | ORAL_TABLET | Freq: Every day | ORAL | Status: DC
  Administered 2018-04-05 – 2018-04-09 (×5): 120 mg via ORAL
  Filled 2018-04-05 (×5): qty 2
  Filled 2018-04-05: qty 1

## 2018-04-05 MED ORDER — VITAMIN B-1 100 MG PO TABS
100.0000 mg | ORAL_TABLET | Freq: Every day | ORAL | Status: DC
  Administered 2018-04-06 – 2018-04-09 (×4): 100 mg via ORAL
  Filled 2018-04-05 (×4): qty 1

## 2018-04-05 MED ORDER — POTASSIUM CHLORIDE CRYS ER 20 MEQ PO TBCR
40.0000 meq | EXTENDED_RELEASE_TABLET | Freq: Once | ORAL | Status: AC
  Administered 2018-04-05: 40 meq via ORAL
  Filled 2018-04-05: qty 2

## 2018-04-05 MED ORDER — ACETAMINOPHEN 325 MG PO TABS
650.0000 mg | ORAL_TABLET | Freq: Four times a day (QID) | ORAL | Status: DC | PRN
  Administered 2018-04-05 – 2018-04-09 (×9): 650 mg via ORAL
  Filled 2018-04-05 (×10): qty 2
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
## 2018-04-05 NOTE — Progress Notes (Signed)
Patient is resting comfortably, no further complaints.

## 2018-04-06 DIAGNOSIS — T43292A Poisoning by other antidepressants, intentional self-harm, initial encounter: Secondary | ICD-10-CM | POA: Diagnosis not present

## 2018-04-06 DIAGNOSIS — F332 Major depressive disorder, recurrent severe without psychotic features: Secondary | ICD-10-CM | POA: Diagnosis not present

## 2018-04-06 DIAGNOSIS — F1020 Alcohol dependence, uncomplicated: Secondary | ICD-10-CM | POA: Diagnosis not present

## 2018-04-06 DIAGNOSIS — Z810 Family history of intellectual disabilities: Secondary | ICD-10-CM | POA: Diagnosis not present

## 2018-04-06 DIAGNOSIS — E039 Hypothyroidism, unspecified: Secondary | ICD-10-CM | POA: Diagnosis not present

## 2018-04-06 DIAGNOSIS — T43212A Poisoning by selective serotonin and norepinephrine reuptake inhibitors, intentional self-harm, initial encounter: Secondary | ICD-10-CM | POA: Diagnosis not present

## 2018-04-06 DIAGNOSIS — Y900 Blood alcohol level of less than 20 mg/100 ml: Secondary | ICD-10-CM | POA: Diagnosis present

## 2018-04-06 DIAGNOSIS — Z630 Problems in relationship with spouse or partner: Secondary | ICD-10-CM | POA: Diagnosis not present

## 2018-04-06 DIAGNOSIS — M542 Cervicalgia: Secondary | ICD-10-CM | POA: Diagnosis present

## 2018-04-06 DIAGNOSIS — R079 Chest pain, unspecified: Secondary | ICD-10-CM | POA: Diagnosis present

## 2018-04-06 DIAGNOSIS — T1491XA Suicide attempt, initial encounter: Secondary | ICD-10-CM | POA: Diagnosis not present

## 2018-04-06 DIAGNOSIS — Z880 Allergy status to penicillin: Secondary | ICD-10-CM | POA: Diagnosis not present

## 2018-04-06 DIAGNOSIS — T391X2A Poisoning by 4-Aminophenol derivatives, intentional self-harm, initial encounter: Secondary | ICD-10-CM | POA: Diagnosis not present

## 2018-04-06 DIAGNOSIS — E2740 Unspecified adrenocortical insufficiency: Secondary | ICD-10-CM | POA: Diagnosis not present

## 2018-04-06 DIAGNOSIS — T510X2A Toxic effect of ethanol, intentional self-harm, initial encounter: Secondary | ICD-10-CM | POA: Diagnosis not present

## 2018-04-06 DIAGNOSIS — T50904A Poisoning by unspecified drugs, medicaments and biological substances, undetermined, initial encounter: Secondary | ICD-10-CM

## 2018-04-06 DIAGNOSIS — M25519 Pain in unspecified shoulder: Secondary | ICD-10-CM | POA: Diagnosis present

## 2018-04-06 DIAGNOSIS — I4581 Long QT syndrome: Secondary | ICD-10-CM | POA: Diagnosis present

## 2018-04-06 DIAGNOSIS — Z818 Family history of other mental and behavioral disorders: Secondary | ICD-10-CM | POA: Diagnosis not present

## 2018-04-06 DIAGNOSIS — F17210 Nicotine dependence, cigarettes, uncomplicated: Secondary | ICD-10-CM | POA: Diagnosis not present

## 2018-04-06 DIAGNOSIS — F3160 Bipolar disorder, current episode mixed, unspecified: Secondary | ICD-10-CM | POA: Diagnosis not present

## 2018-04-06 DIAGNOSIS — Z885 Allergy status to narcotic agent status: Secondary | ICD-10-CM | POA: Diagnosis not present

## 2018-04-06 DIAGNOSIS — F3181 Bipolar II disorder: Secondary | ICD-10-CM | POA: Diagnosis not present

## 2018-04-06 DIAGNOSIS — F29 Unspecified psychosis not due to a substance or known physiological condition: Secondary | ICD-10-CM | POA: Diagnosis present

## 2018-04-06 DIAGNOSIS — T450X2A Poisoning by antiallergic and antiemetic drugs, intentional self-harm, initial encounter: Secondary | ICD-10-CM | POA: Diagnosis not present

## 2018-04-06 DIAGNOSIS — R339 Retention of urine, unspecified: Secondary | ICD-10-CM | POA: Diagnosis present

## 2018-04-06 DIAGNOSIS — Z8619 Personal history of other infectious and parasitic diseases: Secondary | ICD-10-CM | POA: Diagnosis not present

## 2018-04-06 DIAGNOSIS — Z881 Allergy status to other antibiotic agents status: Secondary | ICD-10-CM | POA: Diagnosis not present

## 2018-04-06 DIAGNOSIS — T484X2A Poisoning by expectorants, intentional self-harm, initial encounter: Secondary | ICD-10-CM | POA: Diagnosis not present

## 2018-04-06 LAB — BASIC METABOLIC PANEL WITH GFR
Anion gap: 9 (ref 5–15)
BUN: 10 mg/dL (ref 6–20)
CO2: 25 mmol/L (ref 22–32)
Calcium: 9 mg/dL (ref 8.9–10.3)
Chloride: 105 mmol/L (ref 98–111)
Creatinine, Ser: 0.68 mg/dL (ref 0.44–1.00)
GFR calc Af Amer: >60 mL/min
GFR calc non Af Amer: >60 mL/min
Glucose, Bld: 99 mg/dL (ref 70–99)
Potassium: 4.5 mmol/L (ref 3.5–5.1)
Sodium: 139 mmol/L (ref 135–145)

## 2018-04-06 LAB — CBC
HCT: 41.2 % (ref 36.0–46.0)
Hemoglobin: 13.4 g/dL (ref 12.0–15.0)
MCH: 33.7 pg (ref 26.0–34.0)
MCHC: 32.5 g/dL (ref 30.0–36.0)
MCV: 103.5 fL — ABNORMAL HIGH (ref 80.0–100.0)
Platelets: 222 K/uL (ref 150–400)
RBC: 3.98 MIL/uL (ref 3.87–5.11)
RDW: 11.7 % (ref 11.5–15.5)
WBC: 6 K/uL (ref 4.0–10.5)
nRBC: 0 % (ref 0.0–0.2)

## 2018-04-06 LAB — T3, FREE: T3 FREE: 1.7 pg/mL — AB (ref 2.0–4.4)

## 2018-04-06 MED ORDER — LAMOTRIGINE 100 MG PO TABS
200.0000 mg | ORAL_TABLET | Freq: Every day | ORAL | Status: DC
  Administered 2018-04-06 – 2018-04-09 (×4): 200 mg via ORAL
  Filled 2018-04-06 (×4): qty 2

## 2018-04-06 NOTE — Clinical Social Work Note (Signed)
Clinical Social Work Assessment  Patient Details  Name: Tina [name] MRN: [id number] Date of Birth: 06-Jun-1970  Date of referral:  04/06/18               Reason for consult:  Suicide Risk/Attempt, Mental Health Concerns                Permission sought to share information with:  Family Supports Permission granted to share information::  Yes, Verbal Permission Granted  Name::     Mother Tina [name]  Agency::     Relationship::     Contact Information:     Housing/Transportation Living arrangements for the past 2 months:  Single Family Home Source of Information:  Patient, Parent, Medical Team Patient Interpreter Needed:  None Criminal Activity/Legal Involvement Pertinent to Current Situation/Hospitalization:  No - Comment as needed Significant Relationships:  Dependent Children, Parents, Significant Other Lives with:  Self, Minor Children Do you feel safe going back to the place where you live?  Yes Need for family participation in patient care:  Yes (Comment)(requests mother be involved)  Care giving concerns:  Pt admitted from home where she resides with her children or alone (has partial custody of 3 children). She reports intentionally overdosing on various medications (see provider notes for details).   Social Worker assessment / plan:  CSW consulted to assist with psychiatric disposition. Pt admitted for intentional overdose and in need of psychiatric inpatient treatment upon DC. Met with pt at bedside- pt requested mother also participate in assessment.  Pt reports having mental illness issues since teens- was first hospitalized at age 63. Has had 12 hospitalizations/suicide attempts. Reports at age 71 she [place] halfway house and found this environment helpful, however reports feeling that acute inpatient treatment has not benefited her. Has also done partial hospitalization programs in the past. Is in agreement to pursue inpatient psychiatric treatment and  wants to research some kind of residential treatment environment after stabilizing- mother assisting, unsure of options adult residential care with Medicaid funding.  Pt reports this suicide attempt was a result of various stressors including relational conflict, financial stress.  Pt reports seeing [facility] for both therapy and medication management. Seeking voluntary inpatient treatment.   Employment status:  Unemployed Forensic scientist:  Medicaid In [place] PT Recommendations:    Information / Referral to community resources:  Inpatient Psychiatric Care (Comment Required)  Patient/Family's Response to care:  appreciative  Patient/Family's Understanding of and Emotional Response to Diagnosis, Current Treatment, and Prognosis:  Pt shows adequate understanding of her triggers and events leading to overdose. Did not discuss medical treatment received here as conversation was focused on disposition and psychiatric history. Emotionally pt reports being irritable due to abruptly having a medication stopped (unclear to CSW what medication), however she was appropriate and pleasant during assessment. Mother was as well, seemed highly supportive of pt.  Emotional Assessment Appearance:  Appears stated age Attitude/Demeanor/Rapport:  Engaged Affect (typically observed):  Appropriate(reports irritability but was pleasant during interaction) Orientation:  Oriented to Self, Oriented to Place, Oriented to  Time, Oriented to Situation Alcohol / Substance use:  Alcohol Use, Illicit Drugs(THC positive) Psych involvement (Current and /or in the community):     Discharge Needs  Concerns to be addressed:  Mental Health Concerns Readmission within the last 30 days:  No Current discharge risk:  Psychiatric Illness Barriers to Discharge:  No Barriers Identified(seeking inpatient psychiatric treatment)   Nila Nephew, LCSW 04/06/2018, 2:28 PM [phone] coverage for  [phone]
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
## 2018-04-06 NOTE — Progress Notes (Signed)
Patient is more alert and oriented, able to carry on full conversations. She is crying and expressed to me that she is very stressed because she is battling bankruptcy. She asked me if there were resources or someone that could help her with her financial issue. She states that when she gets better she still will have these same problems and in fear that her kids wont be close enough to their school if she looses the house. I believe she will benefit from a social work consult. She requests resources for mothers who have bankkruptcy. Emotional support given. Will continue to monitor.

## 2018-04-07 DIAGNOSIS — T43212A Poisoning by selective serotonin and norepinephrine reuptake inhibitors, intentional self-harm, initial encounter: Secondary | ICD-10-CM

## 2018-04-07 DIAGNOSIS — T484X2A Poisoning by expectorants, intentional self-harm, initial encounter: Secondary | ICD-10-CM

## 2018-04-07 DIAGNOSIS — T43292A Poisoning by other antidepressants, intentional self-harm, initial encounter: Secondary | ICD-10-CM

## 2018-04-07 MED ORDER — VILAZODONE HCL 10 MG PO TABS
20.0000 mg | ORAL_TABLET | Freq: Every day | ORAL | Status: DC
  Filled 2018-04-07: qty 2

## 2018-04-07 MED ORDER — TRAZODONE HCL 50 MG PO TABS
50.0000 mg | ORAL_TABLET | Freq: Every evening | ORAL | Status: DC | PRN
  Administered 2018-04-07: 50 mg via ORAL
  Filled 2018-04-07: qty 1

## 2018-04-07 MED ORDER — VILAZODONE HCL 40 MG PO TABS: 20.0000 mg | ORAL_TABLET | Freq: Every day | ORAL | Status: DC

## 2018-04-07 MED ORDER — TRAZODONE HCL 50 MG PO TABS: 50.0000 mg | ORAL_TABLET | Freq: Every day | ORAL | Status: DC

## 2018-04-07 MED ORDER — VILAZODONE HCL 20 MG PO TABS
20.0000 mg | ORAL_TABLET | Freq: Every day | ORAL | Status: DC
  Administered 2018-04-07 – 2018-04-09 (×3): 20 mg via ORAL
  Filled 2018-04-07 (×4): qty 1

## 2018-04-07 NOTE — Consult Note (Signed)
[facility] Face-to-Face Psychiatry Consult   Reason for Consult: ''Patient asking about her other psych medications.'' Referring Physician:  Dr. Rito Ehrlich  Patient Identification: Tina [name] MRN:  [id number] Principal Diagnosis: Suicide attempt [facility]) Diagnosis:  Principal Problem:   Suicide attempt [facility]) Active Problems:   Depression   Hypothyroidism   Alcohol use disorder, severe, dependence (HCC)   Bipolar 1 disorder, mixed (HCC)   Anxiety   Affective psychosis, bipolar (HCC)   Overdose   Tobacco abuse   Total Time spent with patient: 30 minutes  Subjective:   Tina [name] is a 48 y.o. female patient admitted with overdose.  Objective:  Patient with  history ofBipolar disorder, depression, anxiety, adrenal insufficiency, tobacco abuse, hypothyroidism who was admitted after she overdosed on 3 beers, Trazodone, Bupropion and Xanax 4 days ago.  who presented with overdose. Patient is requesting to be back on her psych medications: Viibryd, Lamictal, Wellbutrin and Trazodone. Patient reports ongoing suicidal thoughts, depression, mood swings but denies psychosis, delusions or homicidal ideation, intent or plan.  Past Psychiatric History: BPAD, depression and anxiety. She has a prior history of suicide attempts.   Risk to Self:  Yes given suicide attempt.  Risk to Others:  None. Denies HI.  Prior Inpatient Therapy:  She was last admitted to [facility] in 07/2016 for suicide attempt by Tegretol overdose.  Prior Outpatient Therapy:  Prior medication trials: Symbyax and Effexor XR.   Past Medical History:  Past Medical History:  Diagnosis Date  . Acne   . Adrenal insufficiency (HCC)   . Bipolar 2 disorder (HCC)   . Cellulitis   . Depression   . Genital warts   . Headache   . History of chicken pox   . Hx of Lyme disease   . Hypothyroidism     Past Surgical History:  Procedure Laterality Date  . CESAREAN SECTION  2003, 2007, 2011  . INGUINAL HERNIA REPAIR Bilateral    . TONSILLECTOMY    . UMBILICAL HERNIA REPAIR     Family History:  Family History  Problem Relation Age of Onset  . Other Mother        PAD  . Heart disease Other        Grandparent  . Alcohol abuse Paternal Grandmother   . Mental retardation Paternal Grandmother   . Arthritis Maternal Grandfather   . Heart disease Maternal Grandfather    Family Psychiatric  History: As listed above. Also history of depression in extended family.   Social History:  Social History   Substance and Sexual Activity  Alcohol Use Yes   Comment: social drinker-few drinks a week      Social History   Substance and Sexual Activity  Drug Use No    Social History   Socioeconomic History  . Marital status: Single    Spouse name: Not on file  . Number of children: Not on file  . Years of education: 1  . Highest education level: Not on file  Occupational History  . Occupation: HOMEMAKER  Social Needs  . Financial resource strain: Not on file  . Food insecurity:    Worry: Not on file    Inability: Not on file  . Transportation needs:    Medical: Not on file    Non-medical: Not on file  Tobacco Use  . Smoking status: Current Every Day Smoker    Packs/day: 1.50    Years: 29.00    Pack years: 43.50    Types: Cigarettes  .  Smokeless tobacco: Never Used  Substance and Sexual Activity  . Alcohol use: Yes    Comment: social drinker-few drinks a week   . Drug use: No  . Sexual activity: Not Currently  Lifestyle  . Physical activity:    Days per week: Not on file    Minutes per session: Not on file  . Stress: Not on file  Relationships  . Social connections:    Talks on phone: Not on file    Gets together: Not on file    Attends religious service: Not on file    Active member of club or organization: Not on file    Attends meetings of clubs or organizations: Not on file    Relationship status: Not on file  Other Topics Concern  . Not on file  Social History Narrative   Regular  exercise-no   Additional Social History: She lives at home alone. She has joint custody of her 3 young children and gets them twice a week. 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 She denies illicit substance use. She denies alcohol use.      Allergies:   Allergies  Allergen Reactions  . Stadol [Butorphanol] Other (See Comments)    Dependence  . Vancomycin Itching  . Penicillins Hives and Rash    Pt states she does not have an allergy to penicillin  Did it involve swelling of the face/tongue/throat, SOB, or low BP?N Did it involve sudden or severe rash/hives, skin peeling, or any reaction on the inside of your mouth or nose? Y Did you need to seek medical attention at a hospital or doctor's office?Y When did it last happen?before 2010 If all above answers are "NO", may proceed with cephalosporin use.   . Sulfa Antibiotics Rash    Labs:  Results for orders placed or performed during the hospital encounter of 04/03/18 (from the past 48 hour(s))  CBC     Status: Abnormal   Collection Time: 04/06/18  7:47 AM  Result Value Ref Range   WBC 6.0 4.0 - 10.5 K/uL   RBC 3.98 3.87 - 5.11 MIL/uL   Hemoglobin 13.4 12.0 - 15.0 g/dL   HCT 21.3 08.6 - 57.8 %   MCV 103.5 (H) 80.0 - 100.0 fL   MCH 33.7 26.0 - 34.0 pg   MCHC 32.5 30.0 - 36.0 g/dL   RDW 46.9 62.9 - 52.8 %   Platelets 222 150 - 400 K/uL   nRBC 0.0 0.0 - 0.2 %    Comment: Performed at [facility], [address], [place], [place] [address]  Basic metabolic panel     Status: None   Collection Time: 04/06/18  7:47 AM  Result Value Ref Range   Sodium 139 135 - 145 mmol/L   Potassium 4.5 3.5 - 5.1 mmol/L    Comment: DELTA CHECK NOTED REPEATED TO VERIFY NO VISIBLE HEMOLYSIS    Chloride 105 98 - 111 mmol/L   CO2 25 22 - 32 mmol/L   Glucose, Bld 99 70 - 99 mg/dL   BUN 10 6 - 20 mg/dL   Creatinine, Ser 4.01 0.44 - 1.00 mg/dL   Calcium 9.0 8.9 - 02.7 mg/dL   GFR calc non Af Amer >60 >60 mL/min    GFR calc Af Amer >60 >60 mL/min   Anion gap 9 5 - 15    Comment: Performed at [facility], [address], [place], [place] [address]    Current Facility-Administered Medications  Medication Dose Route Frequency Provider Last Rate Last Dose  . acetaminophen (TYLENOL) tablet 650 mg  650 mg Oral Q6H PRN Osvaldo ShipperKrishnan, Gokul, MD   650 mg at 04/07/18 0458  . enoxaparin (LOVENOX) injection 40 mg  40 mg Subcutaneous QHS Lorretta HarpNiu, Xilin, MD   40 mg at 04/06/18 2223  . folic acid (FOLVITE) tablet 1 mg  1 mg Oral Daily Osvaldo ShipperKrishnan, Gokul, MD   1 mg at 04/07/18 0935  . hydrALAZINE (APRESOLINE) injection 5 mg  5 mg Intravenous Q4H PRN Leda GauzeKirby-Graham, Karen J, NP   5 mg at 04/04/18 2115  . lamoTRIgine (LAMICTAL) tablet 200 mg  200 mg Oral Daily Osvaldo ShipperKrishnan, Gokul, MD   200 mg at 04/07/18 0935  . lip balm (CARMEX) ointment   Topical PRN Osvaldo ShipperKrishnan, Gokul, MD   1 application at 04/04/18 2343  . LORazepam (ATIVAN) injection 0-4 mg  0-4 mg Intravenous Q12H Lorretta HarpNiu, Xilin, MD   1 mg at 04/07/18 0457  . LORazepam (ATIVAN) injection 1 mg  1 mg Intravenous Q2H PRN Lorretta HarpNiu, Xilin, MD      . nicotine (NICODERM CQ - dosed in mg/24 hours) patch 21 mg  21 mg Transdermal Daily Lorretta HarpNiu, Xilin, MD   21 mg at 04/07/18 0936  . ondansetron (ZOFRAN) tablet 4 mg  4 mg Oral Q6H PRN Lorretta HarpNiu, Xilin, MD       Or  . ondansetron [facility](ZOFRAN) injection 4 mg  4 mg Intravenous Q6H PRN Lorretta HarpNiu, Xilin, MD   4 mg at 04/06/18 0549  . thiamine (VITAMIN B-1) tablet 100 mg  100 mg Oral Daily Osvaldo ShipperKrishnan, Gokul, MD   100 mg at 04/07/18 0935  . thyroid (ARMOUR) tablet 120 mg  120 mg Oral Daily Osvaldo ShipperKrishnan, Gokul, MD   120 mg at 04/07/18 0935    Musculoskeletal: Strength & Muscle Tone: within normal limits Gait & Station: UTA since patient is lying in bed. Patient leans: N/A  Psychiatric Specialty Exam: Physical Exam  Nursing note and vitals reviewed. Constitutional: She is oriented to person, place, and time. She appears well-developed and well-nourished.   HENT:  Head: Normocephalic and atraumatic.  Neck: Normal range of motion.  Respiratory: Effort normal.  Musculoskeletal: Normal range of motion.  Neurological: She is alert and oriented to person, place, and time.  Psychiatric: Her speech is normal. She is slowed. Cognition and memory are normal. She expresses impulsivity. She exhibits a depressed mood. She expresses suicidal ideation.    Review of Systems  Cardiovascular: Positive for chest pain.  Gastrointestinal: Positive for abdominal pain. Negative for constipation, diarrhea, nausea and vomiting.  Musculoskeletal: Positive for myalgias.  Psychiatric/Behavioral: Positive for depression and suicidal ideas. Negative for hallucinations. The patient does not have insomnia.   All other systems reviewed and are negative.   Blood pressure (!) 142/100, pulse 83, temperature 98 F (36.7 C), temperature source Oral, resp. rate 16, height 5' (1.524 m), weight 62.1 kg, last menstrual period 12/02/2017, SpO2 98 %.Body mass index is 26.76 kg/m.  General Appearance: Fairly Groomed, middle aged, Caucasian female, wearing a hospital gown who is lying in bed. NAD.   Eye Contact:  Poor due to drowsiness.   Speech:  Clear and Coherent and Slow  Volume:  Decreased  Mood:  Depressed  Affect:  Congruent  Thought Process:  Linear and Descriptions of Associations: Intact and nonsensical at times due to drowsiness.   Orientation:  Full (Time, Place, and Person)  Thought Content:  Logical  Suicidal Thoughts:  No  Homicidal Thoughts:  No  Memory:  Immediate;   Fair Recent;   Fair Remote;   Fair  Judgement:  Poor  Insight:  Fair  Psychomotor Activity:  Decreased  Concentration:  Concentration: Fair and Attention Span: Fair  Recall:  [employer]  Fund of Knowledge:  Fair  Language:  Good  Akathisia:  No  Handed:  Right  AIMS (if indicated):   N/A  Assets:  Communication Skills Desire for Improvement Financial Resources/Insurance Housing Physical  Health Social Support  ADL's:  Intact  Cognition:  WNL  Sleep:   fair      Treatment Plan Summary: -Patient warrants inpatient psychiatric hospitalization given high risk of harm to self. -Continue 1:1  bedside sitter for safety -Continue Lamictal 200 mg daily for mood stabilization.  -Restart Viibryd 20 mg daily for depression, Trazodone 50 mg at bedtime as needed for sleep. -Hold Wellbutrin XL until QTc is within normal limit. -Continue CIWA protocol given risk for benzodiazepine withdrawal.  -EKG reviewed and QTc 466 (down from 495) on 1/21. Please closely monitor when starting or increasing QTc prolonging agents.  -Please pursue involuntary commitment if patient refuses voluntary psychiatric hospitalization or attempts to leave the hospital.  -Will sign off on patient at this time. Please consult psychiatry again as needed.     Disposition: Recommend psychiatric Inpatient admission when medically cleared.  Thedore MinsMojeed Armeda Plumb, MD 04/07/2018 1:06 PM

## 2018-04-07 NOTE — Plan of Care (Signed)
  Problem: Nutrition: Goal: Adequate fluids and nutrition will be maintained Outcome: Progressing   Problem: Medication Management: Goal: Adhere to prescribed medication regimen Outcome: Progressing   

## 2018-04-07 NOTE — Progress Notes (Signed)
Pt refused to have PIV inserted.  Primary RN was notified of pt's refusal.

## 2018-04-07 NOTE — Progress Notes (Signed)
Patient IV site is occluded, red, swollen, and tender. Not able to flush. IV team consulted. Patient refused IV administering. NO IV ACCESS at this time. Will continue to monitor.
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
## 2018-04-08 MED ORDER — TRAZODONE HCL 100 MG PO TABS
100.0000 mg | ORAL_TABLET | Freq: Every evening | ORAL | Status: DC | PRN
  Administered 2018-04-08: 100 mg via ORAL
  Filled 2018-04-08: qty 1

## 2018-04-08 MED ORDER — BUPROPION HCL ER (XL) 150 MG PO TB24
150.0000 mg | ORAL_TABLET | Freq: Every day | ORAL | Status: DC
  Administered 2018-04-08: 150 mg via ORAL
  Filled 2018-04-08: qty 1

## 2018-04-08 NOTE — Plan of Care (Signed)
  Problem: Coping: Goal: Ability to disclose and discuss thoughts of suicide and self-harm will improve Outcome: Progressing   Problem: Nutrition: Goal: Adequate fluids and nutrition will be maintained Outcome: Progressing   Problem: Sleep Hygiene: Goal: Ability to obtain adequate restful sleep will improve Outcome: Progressing
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
## 2018-04-09 ENCOUNTER — Encounter: Payer: Self-pay | Admitting: *Deleted

## 2018-04-09 ENCOUNTER — Inpatient Hospital Stay
Admission: AD | Admit: 2018-04-09 | Discharge: 2018-04-11 | DRG: 885 | Disposition: A | Discharge status: Home / Self Care | Payer: Medicaid Other | Source: Intra-hospital | Attending: Psychiatry | Admitting: Psychiatry

## 2018-04-09 ENCOUNTER — Other Ambulatory Visit: Payer: Self-pay

## 2018-04-09 DIAGNOSIS — F332 Major depressive disorder, recurrent severe without psychotic features: Secondary | ICD-10-CM | POA: Diagnosis not present

## 2018-04-09 DIAGNOSIS — Z716 Tobacco abuse counseling: Secondary | ICD-10-CM

## 2018-04-09 DIAGNOSIS — Z915 Personal history of self-harm: Secondary | ICD-10-CM

## 2018-04-09 DIAGNOSIS — R45851 Suicidal ideations: Secondary | ICD-10-CM | POA: Diagnosis present

## 2018-04-09 DIAGNOSIS — Z888 Allergy status to other drugs, medicaments and biological substances status: Secondary | ICD-10-CM

## 2018-04-09 DIAGNOSIS — Z8261 Family history of arthritis: Secondary | ICD-10-CM | POA: Diagnosis not present

## 2018-04-09 DIAGNOSIS — T50901A Poisoning by unspecified drugs, medicaments and biological substances, accidental (unintentional), initial encounter: Secondary | ICD-10-CM | POA: Diagnosis present

## 2018-04-09 DIAGNOSIS — F17210 Nicotine dependence, cigarettes, uncomplicated: Secondary | ICD-10-CM | POA: Diagnosis present

## 2018-04-09 DIAGNOSIS — F314 Bipolar disorder, current episode depressed, severe, without psychotic features: Principal | ICD-10-CM | POA: Diagnosis present

## 2018-04-09 DIAGNOSIS — Z8619 Personal history of other infectious and parasitic diseases: Secondary | ICD-10-CM

## 2018-04-09 DIAGNOSIS — Z79899 Other long term (current) drug therapy: Secondary | ICD-10-CM

## 2018-04-09 DIAGNOSIS — Z880 Allergy status to penicillin: Secondary | ICD-10-CM | POA: Diagnosis not present

## 2018-04-09 DIAGNOSIS — Z811 Family history of alcohol abuse and dependence: Secondary | ICD-10-CM | POA: Diagnosis not present

## 2018-04-09 DIAGNOSIS — Z810 Family history of intellectual disabilities: Secondary | ICD-10-CM

## 2018-04-09 DIAGNOSIS — Z882 Allergy status to sulfonamides status: Secondary | ICD-10-CM

## 2018-04-09 DIAGNOSIS — Z8249 Family history of ischemic heart disease and other diseases of the circulatory system: Secondary | ICD-10-CM | POA: Diagnosis not present

## 2018-04-09 DIAGNOSIS — E039 Hypothyroidism, unspecified: Secondary | ICD-10-CM | POA: Diagnosis present

## 2018-04-09 DIAGNOSIS — Z881 Allergy status to other antibiotic agents status: Secondary | ICD-10-CM

## 2018-04-09 MED ORDER — ACETAMINOPHEN 325 MG PO TABS: 650.0000 mg | ORAL_TABLET | Freq: Four times a day (QID) | ORAL | Status: DC | PRN

## 2018-04-09 MED ORDER — ALUM & MAG HYDROXIDE-SIMETH 200-200-20 MG/5ML PO SUSP: 30.0000 mL | ORAL | Status: DC | PRN

## 2018-04-09 MED ORDER — MAGNESIUM HYDROXIDE 400 MG/5ML PO SUSP: 30.0000 mL | Freq: Every day | ORAL | Status: DC | PRN

## 2018-04-09 MED ORDER — THYROID 60 MG PO TABS
120.0000 mg | ORAL_TABLET | Freq: Every day | ORAL | Status: DC
  Administered 2018-04-10 – 2018-04-11 (×2): 120 mg via ORAL
  Filled 2018-04-09 (×2): qty 2

## 2018-04-09 MED ORDER — VILAZODONE HCL 20 MG PO TABS: 20.0000 mg | ORAL_TABLET | Freq: Every day | ORAL | Status: DC

## 2018-04-09 MED ORDER — BUPROPION HCL ER (XL) 300 MG PO TB24
300.0000 mg | ORAL_TABLET | Freq: Every day | ORAL | Status: DC
  Administered 2018-04-09: 300 mg via ORAL
  Filled 2018-04-09: qty 1

## 2018-04-09 MED ORDER — LAMOTRIGINE 100 MG PO TABS
200.0000 mg | ORAL_TABLET | Freq: Every day | ORAL | Status: DC
  Administered 2018-04-10 – 2018-04-11 (×2): 200 mg via ORAL
  Filled 2018-04-09 (×2): qty 2

## 2018-04-09 MED ORDER — BUPROPION HCL ER (XL) 150 MG PO TB24
300.0000 mg | ORAL_TABLET | Freq: Every day | ORAL | Status: DC
  Administered 2018-04-10 – 2018-04-11 (×2): 300 mg via ORAL
  Filled 2018-04-09 (×2): qty 2

## 2018-04-09 MED ORDER — TRAZODONE HCL 100 MG PO TABS
100.0000 mg | ORAL_TABLET | Freq: Every evening | ORAL | Status: DC | PRN
  Administered 2018-04-09 – 2018-04-10 (×2): 100 mg via ORAL
  Filled 2018-04-09 (×2): qty 1

## 2018-04-09 MED ORDER — ACETAMINOPHEN 325 MG PO TABS
650.0000 mg | ORAL_TABLET | Freq: Four times a day (QID) | ORAL | Status: DC | PRN
  Administered 2018-04-09 – 2018-04-10 (×2): 650 mg via ORAL
  Filled 2018-04-09 (×2): qty 2

## 2018-04-09 MED ORDER — LIP MEDEX EX OINT
TOPICAL_OINTMENT | CUTANEOUS | Status: DC | PRN
  Filled 2018-04-09: qty 7

## 2018-04-09 MED ORDER — FOLIC ACID 1 MG PO TABS
1.0000 mg | ORAL_TABLET | Freq: Every day | ORAL | Status: DC
  Administered 2018-04-10: 1 mg via ORAL
  Filled 2018-04-09: qty 1

## 2018-04-09 MED ORDER — HYDROXYZINE HCL 25 MG PO TABS: 25.0000 mg | ORAL_TABLET | Freq: Three times a day (TID) | ORAL | Status: DC | PRN

## 2018-04-09 MED ORDER — HYDRALAZINE HCL 20 MG/ML IJ SOLN
5.0000 mg | INTRAMUSCULAR | Status: DC | PRN
  Filled 2018-04-09: qty 0.25

## 2018-04-09 MED ORDER — THIAMINE HCL 100 MG PO TABS: 100.0000 mg | ORAL_TABLET | Freq: Every day | ORAL | Status: DC

## 2018-04-09 MED ORDER — FOLIC ACID 1 MG PO TABS: 1.0000 mg | ORAL_TABLET | Freq: Every day | ORAL | Status: DC

## 2018-04-09 MED ORDER — NICOTINE 21 MG/24HR TD PT24
21.0000 mg | MEDICATED_PATCH | Freq: Every day | TRANSDERMAL | Status: DC
  Filled 2018-04-09 (×2): qty 1

## 2018-04-09 MED ORDER — VITAMIN B-1 100 MG PO TABS
100.0000 mg | ORAL_TABLET | Freq: Every day | ORAL | Status: DC
  Administered 2018-04-10: 100 mg via ORAL
  Filled 2018-04-09: qty 1

## 2018-04-09 MED ORDER — VILAZODONE HCL 20 MG PO TABS
20.0000 mg | ORAL_TABLET | Freq: Every day | ORAL | Status: DC
  Administered 2018-04-10 – 2018-04-11 (×2): 20 mg via ORAL
  Filled 2018-04-09 (×3): qty 1

## 2018-04-09 MED ORDER — NICOTINE 21 MG/24HR TD PT24: 21.0000 mg | MEDICATED_PATCH | Freq: Every day | TRANSDERMAL | 0 refills | Status: DC

## 2018-04-09 NOTE — Progress Notes (Signed)
D: Pt denies SI/HI/AV hallucinations. Pt is pleasant and cooperative. Pt goal for today is to get adjusted to unit. A: Pt was offered support and encouragement. Pt was given scheduled medications. Pt was encourage to attend groups. Q 15 minute checks were done for safety.  R:Pt attends groups and interacts well with peers and staff. Pt is taking medication. Pt has no complaints.Pt receptive to treatment and safety maintained on unit. 

## 2018-04-09 NOTE — Progress Notes (Addendum)
12:03 PM Patient has bed at [facility]. LCSW awaiting bed info. LCSW notified attending.     10:20AM LCSW following for inpatient psych placement.   Per attending, patient is medically stable to transfer to psych facility.   LCSW faxed patient out to the following facilities:   [facility]  LCSW will continue to follow for placement.   Beulah Gandy [facility] CSW [phone]

## 2018-04-09 NOTE — BH Assessment (Signed)
Patient has been accepted to [facility].  Accepting physician is Dr. Viviano Simas.  Attending Physician will be Dr. Jennet Maduro.  Patient has been assigned to room 320, by [facility] Charge Nurse [place].  Call report to [phone].  Representative/Transfer Coordinator is Warden/ranger Patient pre-admitted by [facility] Patient Access Gerilyn Pilgrim)  [facility] Staff Doristine Locks, Social Worker) made aware of acceptance.

## 2018-04-09 NOTE — Plan of Care (Signed)
  Problem: Coping: °Goal: Will verbalize feelings °Outcome: Progressing °  °Problem: Health Behavior/Discharge Planning: °Goal: Compliance with therapeutic regimen will improve °Outcome: Progressing °  °Problem: Safety: °Goal: Ability to disclose and discuss suicidal ideas will improve °Outcome: Progressing °  °

## 2018-04-09 NOTE — Progress Notes (Signed)
Report on patient given to [place], RN [facility] and informed of patient pick-up time from [facility] at 1430.
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
## 2018-04-09 NOTE — [facility] Group Notes (Signed)
[facility] Group Notes:  (Nursing/MHT/Case Management/Adjunct)  Date:  04/09/2018  Time:  9:10 PM  Type of Therapy:  Group Therapy  Participation Level:  Active  Participation Quality:  Appropriate  Affect:  Appropriate  Cognitive:  Appropriate  Insight:  Appropriate  Engagement in Group:  Engaged  Modes of Intervention:  Discussion  Summary of Progress/Problems:  Tina [name] 04/09/2018, 9:10 PM
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
## 2018-04-09 NOTE — Progress Notes (Signed)
Tina [name] to be D/C'd [facility] per MD order.  Discussed prescriptions and follow up appointments with the patient. Prescriptions given to patient, medication list explained in detail. Pt verbalized understanding.  Allergies as of 04/09/2018      Reactions   Stadol [butorphanol] Other (See Comments)   Dependence   Vancomycin Itching   Penicillins Hives, Rash   Pt states she does not have an allergy to penicillin Did it involve swelling of the face/tongue/throat, SOB, or low BP?N Did it involve sudden or severe rash/hives, skin peeling, or any reaction on the inside of your mouth or nose? Y Did you need to seek medical attention at a hospital or doctor's office?Y When did it last happen?before 2010 If all above answers are "NO", may proceed with cephalosporin use.   Sulfa Antibiotics Rash      Medication List    STOP taking these medications   ALPRAZolam 0.5 MG tablet Commonly known as:  XANAX   ARIPiprazole 2 MG tablet Commonly known as:  ABILIFY   Echinacea 400 MG Caps   hydrOXYzine 25 MG tablet Commonly known as:  ATARAX/VISTARIL   liothyronine 25 MCG tablet Commonly known as:  CYTOMEL   NYQUIL COLD & FLU PO   sertraline 50 MG tablet Commonly known as:  ZOLOFT     TAKE these medications   acetaminophen 325 MG tablet Commonly known as:  TYLENOL Take 2 tablets (650 mg total) by mouth every 6 (six) hours as needed for mild pain or fever.   ARMOUR THYROID 120 MG tablet Generic drug:  thyroid Take 120 mg by mouth daily.   buPROPion 300 MG 24 hr tablet Commonly known as:  WELLBUTRIN XL Take 300 mg by mouth daily. Pt takes a total of 450mg  daily using 150mg  and 300mg  tablets What changed:  Another medication with the same name was removed. Continue taking this medication, and follow the directions you see here.   folic acid 1 MG tablet Commonly known as:  FOLVITE Take 1 tablet (1 mg total) by mouth daily. Start taking on:  April 10, 2018    lamoTRIgine 200 MG tablet Commonly known as:  LAMICTAL Take 200 mg by mouth daily.   nicotine 21 mg/24hr patch Commonly known as:  NICODERM CQ - dosed in mg/24 hours Place 1 patch (21 mg total) onto the skin daily. Start taking on:  April 10, 2018   thiamine 100 MG tablet Take 1 tablet (100 mg total) by mouth daily. Start taking on:  April 10, 2018   traZODone 100 MG tablet Commonly known as:  DESYREL Take 100 mg by mouth at bedtime.   Vilazodone HCl 20 MG Tabs Take 1 tablet (20 mg total) by mouth daily. Start taking on:  April 10, 2018 What changed:    medication strength  how much to take       Vitals:   04/08/18 2144 04/09/18 0617  BP: 113/80 116/65  Pulse: 73 79  Resp: 18 18  Temp: 98.3 F (36.8 C) 98.5 F (36.9 C)  SpO2: 98% 98%    Patient transferred to [facility] via [facility] [employer] [facility].    Rondel Jumbo 04/09/2018 2:39 PM

## 2018-04-09 NOTE — Tx Team (Signed)
Initial Treatment Plan 04/09/2018 5:48 PM Tina [name] JTT:[id number]    PATIENT STRESSORS: Financial difficulties Marital or family conflict Occupational concerns   PATIENT STRENGTHS: Average or above average intelligence Capable of independent living Supportive family/friends   PATIENT IDENTIFIED PROBLEMS: Depression  Anxiety  Financial Difficulties  Custody Issues  Occupational Concerns  Multiple Hospital Admissions  Multiple Suicide Attempts  Suicide Risk       DISCHARGE CRITERIA:  Motivation to continue treatment in a less acute level of care Verbal commitment to aftercare and medication compliance  PRELIMINARY DISCHARGE PLAN: Outpatient therapy Return to previous living arrangement  PATIENT/FAMILY INVOLVEMENT: This treatment plan has been presented to and reviewed with the patient, Tina [name].  The patient and family have been given the opportunity to ask questions and make suggestions.  Cranford Mon, RN 04/09/2018, 5:48 PM

## 2018-04-10 DIAGNOSIS — F332 Major depressive disorder, recurrent severe without psychotic features: Secondary | ICD-10-CM

## 2018-04-10 NOTE — Plan of Care (Signed)
Pt Denies any suicide ideation and contracts for safety. Pt Compliance with all medications.  Pt still anxious about her situation at home.   Problem: Education: Goal: Utilization of techniques to improve thought processes will improve 04/10/2018 2212 by Sharia Reeve, RN Outcome: Progressing 04/10/2018 2207 by Sharia Reeve, RN Outcome: Progressing Goal: Knowledge of the prescribed therapeutic regimen will improve 04/10/2018 2212 by Sharia Reeve, RN Outcome: Progressing 04/10/2018 2207 by Sharia Reeve, RN Outcome: Progressing   Problem: Activity: Goal: Interest or engagement in leisure activities will improve 04/10/2018 2212 by Sharia Reeve, RN Outcome: Progressing 04/10/2018 2207 by Sharia Reeve, RN Outcome: Progressing Goal: Imbalance in normal sleep/wake cycle will improve 04/10/2018 2212 by Sharia Reeve, RN Outcome: Progressing 04/10/2018 2207 by Sharia Reeve, RN Outcome: Progressing   Problem: Coping: Goal: Coping ability will improve Outcome: Progressing Goal: Will verbalize feelings 04/10/2018 2212 by Sharia Reeve, RN Outcome: Progressing 04/10/2018 2207 by Sharia Reeve, RN Outcome: Progressing   Problem: Health Behavior/Discharge Planning: Goal: Ability to make decisions will improve 04/10/2018 2212 by Sharia Reeve, RN Outcome: Progressing 04/10/2018 2207 by Sharia Reeve, RN Outcome: Progressing Goal: Compliance with therapeutic regimen will improve 04/10/2018 2212 by Sharia Reeve, RN Outcome: Progressing 04/10/2018 2207 by Sharia Reeve, RN Outcome: Progressing   Problem: Role Relationship: Goal: Will demonstrate positive changes in social behaviors and relationships 04/10/2018 2212 by Sharia Reeve, RN Outcome: Progressing 04/10/2018 2207 by Sharia Reeve, RN Outcome: Progressing   Problem: Safety: Goal: Ability to disclose and discuss suicidal ideas will improve 04/10/2018 2212 by Sharia Reeve, RN Outcome:  Progressing 04/10/2018 2207 by Sharia Reeve, RN Outcome: Progressing   Problem: Education: Goal: Knowledge of [facility] Education information/materials will improve 04/10/2018 2212 by Sharia Reeve, RN Outcome: Progressing 04/10/2018 2207 by Sharia Reeve, RN Outcome: Progressing Goal: Emotional status will improve 04/10/2018 2212 by Sharia Reeve, RN Outcome: Progressing 04/10/2018 2207 by Sharia Reeve, RN Outcome: Progressing Goal: Mental status will improve 04/10/2018 2212 by Sharia Reeve, RN Outcome: Progressing 04/10/2018 2207 by Sharia Reeve, RN Outcome: Progressing Goal: Verbalization of understanding the information provided will improve 04/10/2018 2212 by Sharia Reeve, RN Outcome: Progressing 04/10/2018 2207 by Sharia Reeve, RN Outcome: Progressing   Problem: Activity: Goal: Interest or engagement in activities will improve 04/10/2018 2212 by Sharia Reeve, RN Outcome: Progressing 04/10/2018 2207 by Sharia Reeve, RN Outcome: Progressing Goal: Sleeping patterns will improve 04/10/2018 2212 by Sharia Reeve, RN Outcome: Progressing 04/10/2018 2207 by Sharia Reeve, RN Outcome: Progressing   Problem: Coping: Goal: Ability to verbalize frustrations and anger appropriately will improve 04/10/2018 2212 by Sharia Reeve, RN Outcome: Progressing 04/10/2018 2207 by Sharia Reeve, RN Outcome: Progressing Goal: Ability to demonstrate self-control will improve 04/10/2018 2212 by Sharia Reeve, RN Outcome: Progressing 04/10/2018 2207 by Sharia Reeve, RN Outcome: Progressing   Problem: Health Behavior/Discharge Planning: Goal: Identification of resources available to assist in meeting health care needs will improve 04/10/2018 2212 by Sharia Reeve, RN Outcome: Progressing 04/10/2018 2207 by Sharia Reeve, RN Outcome: Progressing Goal: Compliance with treatment plan for underlying cause of condition will improve 04/10/2018 2212 by  Sharia Reeve, RN Outcome: Progressing 04/10/2018 2207 by Sharia Reeve, RN Outcome: Progressing   Problem: Physical Regulation: Goal: Ability to maintain clinical measurements within normal limits will improve 04/10/2018 2212 by Sharia Reeve,  RN Outcome: Progressing 04/10/2018 2207 by Sharia ReeveBatse, Ryley Teater E, RN Outcome: Progressing   Problem: Safety: Goal: Periods of time without injury will increase 04/10/2018 2212 by Sharia ReeveBatse, Ocie Stanzione E, RN Outcome: Progressing 04/10/2018 2207 by Sharia ReeveBatse, Maysen Sudol E, RN Outcome: Progressing   Problem: Education: Goal: Ability to make informed decisions regarding treatment will improve 04/10/2018 2212 by Sharia ReeveBatse, Hajar Penninger E, RN Outcome: Progressing 04/10/2018 2207 by Sharia ReeveBatse, Markan Cazarez E, RN Outcome: Progressing   Problem: Coping: Goal: Coping ability will improve 04/10/2018 2212 by Sharia ReeveBatse, Archie Shea E, RN Outcome: Progressing 04/10/2018 2207 by Sharia ReeveBatse, Raven Furnas E, RN Outcome: Progressing   Problem: Health Behavior/Discharge Planning: Goal: Identification of resources available to assist in meeting health care needs will improve 04/10/2018 2212 by Sharia ReeveBatse, Anijah Spohr E, RN Outcome: Progressing 04/10/2018 2207 by Sharia ReeveBatse, Manford Sprong E, RN Outcome: Progressing   Problem: Medication: Goal: Compliance with prescribed medication regimen will improve 04/10/2018 2212 by Sharia ReeveBatse, Greenley Martone E, RN Outcome: Progressing 04/10/2018 2207 by Sharia ReeveBatse, Tynisa Vohs E, RN Outcome: Progressing   Problem: Self-Concept: Goal: Ability to disclose and discuss suicidal ideas will improve 04/10/2018 2212 by Sharia ReeveBatse, Deborah Dondero E, RN Outcome: Progressing 04/10/2018 2207 by Sharia ReeveBatse, Eryc Bodey E, RN Outcome: Progressing
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
## 2018-04-10 NOTE — [facility] Suicide Risk Assessment (Signed)
[facility] INPATIENT:  Family/Significant Other Suicide Prevention Education  Suicide Prevention Education:  Patient Refusal for Family/Significant Other Suicide Prevention Education: The patient Tina [name] has refused to provide written consent for family/significant other to be provided Family/Significant Other Suicide Prevention Education during admission and/or prior to discharge.  Physician notified.  SPE completed with pt, as pt refused to consent to family contact. SPI pamphlet provided to pt and pt was encouraged to share information with support network, ask questions, and talk about any concerns relating to SPE. Pt denies access to guns/firearms and verbalized understanding of information provided. Mobile Crisis information also provided to pt.    Tina [name] 04/10/2018, 9:59 AM

## 2018-04-10 NOTE — Discharge Summary (Signed)
Physician Discharge Summary Note  Patient:  Tina [name] is an 48 y.o., female MRN:  [id number] DOB:  1971-01-27 Patient phone:  [phone] (home)  Patient address:   [address] [place] [address],  Total Time spent with patient: 20 minutes plus 15 min on care coordination and documentation  Date of Admission:  04/09/2018 Date of Discharge: 04/11/2018  Reason for Admission:  Overdose.  History of Present Illness:   Identifying data. Tina [name] is a 48 year old female with a history of bipolar disorder.  Chief complaint. "It was deliberate."  History of present illness. Information was obtained from the patient and the chart. The patient was transferred from [facility] medical floor where she was hospitalized after overdose on 04/03/2018 on multiple medications. The patient has been divorced for over six years and has joined custody of her 3 children. She was doing well, working and able to buy a house independently, until she had another bout of Lyme disease. She lost employment and is about to foreclose. 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 Because of partial employment, she lost $200 in food stamps. She does not receive alimony but the kids get child support. She has been to court recently to learn that she can not file for bankruptcy under chapter 11 (has not enough money). She has support from her ex-husband who remarried and has a baby now, her mother and her sister but became overwhelmed with her situation and overdosed. She alarmed but one friend, who did not respond I time. She was found by her sister after the patient did not show up to pick up her kids. She has been a patient at the [facility] where she sees a psychiatrist and a therapist. Happy with services and compliant with medications. She denies psychotic symptoms or symptoms suggestive of bipolar mania. No drugs or alcohol involved.  On my interview, the patient is very  peasant and polite. She is slightly tearful talking about her troubles but no longer suicidal or homicidal. I shared a letter I received from her mother that is rather alarmist suggesting that the patient is going to kill herself foe sure at discharge, suggesting long term hospitalization. The patient will consider family meeting but for now, I do not have permission to talk to the family. They are not allowed to visit or call.  Past psychiatric history. First hospitalization at the age of 3. Lifelong struggle with bipolar. She was doing better when in stable marriage but left her husband years ago. Multiple hospitalizations and medication trials. Severel suicide attempt by overdose and one by cutting. Happy with her current regimen and care. There were several manic episodes in addition to depression. In 2017, she went missing for one month, found in another state.  Family psychiatric history. None.  Social history. Graduated from college with a degree in [place] literature. Has been gainfully employed since divorce until second bout of Lyme disease. Has Medicaid.  Principal Problem: Bipolar I disorder, most recent episode depressed, severe without psychotic features [facility]) Discharge Diagnoses: Principal Problem:   Bipolar I disorder, most recent episode depressed, severe without psychotic features (HCC) Active Problems:   Hypothyroidism   Overdose  Past Medical History:  Past Medical History:  Diagnosis Date  . Acne   . Adrenal insufficiency (HCC)   . Bipolar 2 disorder (HCC)   . Cellulitis   . Depression   . Genital warts   . Headache   . History of chicken  pox   . Hx of Lyme disease   . Hypothyroidism     Past Surgical History:  Procedure Laterality Date  . CESAREAN SECTION  2003, 2007, 2011  . INGUINAL HERNIA REPAIR Bilateral   . TONSILLECTOMY    . UMBILICAL HERNIA REPAIR     Family History:  Family History  Problem Relation Age of Onset  . Other Mother        PAD  .  Heart disease Other        Grandparent  . Alcohol abuse Paternal Grandmother   . Mental retardation Paternal Grandmother   . Arthritis Maternal Grandfather   . Heart disease Maternal Grandfather    Social History:  Social History   Substance and Sexual Activity  Alcohol Use Yes   Comment: social drinker-few drinks a week      Social History   Substance and Sexual Activity  Drug Use No    Social History   Socioeconomic History  . Marital status: Single    Spouse name: Not on file  . Number of children: Not on file  . Years of education: 3416  . Highest education level: Not on file  Occupational History  . Occupation: HOMEMAKER  Social Needs  . Financial resource strain: Not on file  . Food insecurity:    Worry: Not on file    Inability: Not on file  . Transportation needs:    Medical: Not on file    Non-medical: Not on file  Tobacco Use  . Smoking status: Current Every Day Smoker    Packs/day: 1.50    Years: 29.00    Pack years: 43.50    Types: Cigarettes  . Smokeless tobacco: Never Used  Substance and Sexual Activity  . Alcohol use: Yes    Comment: social drinker-few drinks a week   . Drug use: No  . Sexual activity: Not Currently  Lifestyle  . Physical activity:    Days per week: Not on file    Minutes per session: Not on file  . Stress: Not on file  Relationships  . Social connections:    Talks on phone: Not on file    Gets together: Not on file    Attends religious service: Not on file    Active member of club or organization: Not on file    Attends meetings of clubs or organizations: Not on file    Relationship status: Not on file  Other Topics Concern  . Not on file  Social History Narrative   Regular exercise-no    Hospital Course:    [name] is a[age] year old female with a history of bipolar disorder [facility] where she was hospitalized after overdose on medication on 04/03/2018 in the context of severe social  stressors. She was restarted on her regular medications and tolerated them well. At the time of discharge, the patient is no longer suicidal or homicidal. She is able to contract for safety. She is forward thinking and optimistic about te future.  #Mood, improved -continue Lamictal 200 mg nightly -Wellbutrin 300 mg daily -Viibryd 20 mg daily -Trazodone 100 mg nightly PRN  #Hypothyroidism -Armour thyroid 120 mg daily  #Smoking cessation -nicotine patch is available  #Disposition -discharge to home -follow up with [facility]  Physical Findings: AIMS: Facial and Oral Movements Muscles of Facial Expression: None, normal Lips and Perioral Area: None, normal Jaw: None, normal Tongue: None, normal,Extremity Movements Upper (arms, wrists, hands, fingers): None, normal Lower (legs, knees,  ankles, toes): None, normal, Trunk Movements Neck, shoulders, hips: None, normal, Overall Severity Severity of abnormal movements (highest score from questions above): None, normal Incapacitation due to abnormal movements: None, normal Patient's awareness of abnormal movements (rate only patient's report): No Awareness, Dental Status Current problems with teeth and/or dentures?: No Does patient usually wear dentures?: No  CIWA:    COWS:     Musculoskeletal: Strength & Muscle Tone: within normal limits Gait & Station: normal Patient leans: N/A  Psychiatric Specialty Exam: Physical Exam  Nursing note and vitals reviewed. Psychiatric: Her speech is normal and behavior is normal. Thought content normal. Her mood appears anxious. Cognition and memory are normal. She expresses impulsivity.    Review of Systems  Neurological: Negative.   Psychiatric/Behavioral: Negative.   All other systems reviewed and are negative.   Blood pressure 103/75, pulse 75, temperature 98.1 F (36.7 C), temperature source Oral, resp. rate 18, height 5' (1.524 m), weight 63.5 kg, SpO2 98 %.Body mass index is 27.34  kg/m.  General Appearance: Casual  Eye Contact:  Good  Speech:  Clear and Coherent  Volume:  Normal  Mood:  Anxious  Affect:  Appropriate  Thought Process:  Goal Directed and Descriptions of Associations: Intact  Orientation:  Full (Time, Place, and Person)  Thought Content:  WDL  Suicidal Thoughts:  No  Homicidal Thoughts:  No  Memory:  Immediate;   Fair Recent;   Fair Remote;   Fair  Judgement:  Impaired  Insight:  Present  Psychomotor Activity:  Normal  Concentration:  Concentration: Fair and Attention Span: Fair  Recall:  [employer] of Knowledge:  Fair  Language:  Fair  Akathisia:  No  Handed:  Right  AIMS (if indicated):     Assets:  Communication Skills Desire for Improvement Financial Resources/Insurance Resilience Social Support Transportation Vocational/Educational  ADL's:  Intact  Cognition:  WNL  Sleep:  Number of Hours: 7     Have you used any form of tobacco in the last 30 days? (Cigarettes, Smokeless Tobacco, Cigars, and/or Pipes): No  Has this patient used any form of tobacco in the last 30 days? (Cigarettes, Smokeless Tobacco, Cigars, and/or Pipes) Yes, Yes, A prescription for an FDA-approved tobacco cessation medication was offered at discharge and the patient refused  Blood Alcohol level:  Lab Results  Component Value Date   [facility] <10 04/03/2018   ETH <5 07/08/2016    Metabolic Disorder Labs:  Lab Results  Component Value Date   HGBA1C 5.1 12/03/2014   MPG 100 12/03/2014   MPG 100 12/02/2014   No results found for: PROLACTIN Lab Results  Component Value Date   CHOL 195 12/03/2014   TRIG 122 12/03/2014   HDL 42 12/03/2014   CHOLHDL 4.6 12/03/2014   VLDL 24 12/03/2014   LDLCALC 129 (H) 12/03/2014    See Psychiatric Specialty Exam and Suicide Risk Assessment completed by Attending Physician prior to discharge.  Discharge destination:  Home  Is patient on multiple antipsychotic therapies at discharge:  No   Has Patient had three or  more failed trials of antipsychotic monotherapy by history:  No  Recommended Plan for Multiple Antipsychotic Therapies: NA   Allergies as of 04/11/2018      Reactions   Stadol [butorphanol] Other (See Comments)   Dependence   Vancomycin Itching   Penicillins Hives, Rash   Pt states she does not have an allergy to penicillin Did it involve swelling of the face/tongue/throat, SOB, or low BP?N Did  it involve sudden or severe rash/hives, skin peeling, or any reaction on the inside of your mouth or nose? Y Did you need to seek medical attention at a hospital or doctor's office?Y When did it last happen?before 2010 If all above answers are "NO", may proceed with cephalosporin use.   Sulfa Antibiotics Rash      Medication List    STOP taking these medications   acetaminophen 325 MG tablet Commonly known as:  TYLENOL   folic acid 1 MG tablet Commonly known as:  FOLVITE   nicotine 21 mg/24hr patch Commonly known as:  NICODERM CQ - dosed in mg/24 hours   thiamine 100 MG tablet     TAKE these medications     Indication  ARMOUR THYROID 120 MG tablet Generic drug:  thyroid Take 120 mg by mouth daily.  Indication:  Underactive Thyroid   buPROPion 300 MG 24 hr tablet Commonly known as:  WELLBUTRIN XL Take 300 mg by mouth daily. Pt takes a total of 450mg  daily using 150mg  and 300mg  tablets  Indication:  Major Depressive Disorder   lamoTRIgine 200 MG tablet Commonly known as:  LAMICTAL Take 200 mg by mouth daily.  Indication:  Manic-Depression   traZODone 100 MG tablet Commonly known as:  DESYREL Take 100 mg by mouth at bedtime.  Indication:  Trouble Sleeping   Vilazodone HCl 20 MG Tabs Take 1 tablet (20 mg total) by mouth daily.  Indication:  Major Depressive Disorder      Follow-up Information    [facility] Follow up on 04/16/2018.   Specialty:  Behavioral Health Why:  You have an appointment scheduled with Shanda BumpsJessica on Monday 04/16/2018 at 11:00. Please  discuss need for medication management follow-up with psychiatrist at appointment with Shanda BumpsJessica. Thank you! Contact information: [address] [place] [address] [phone]           Follow-up recommendations:  Activity:  as tolerateed Diet:  regular Other:  keep follow up apointments  Comments:     Signed: Kristine LineaJolanta Rahkim Rabalais, MD 04/11/2018, 10:07 AM

## 2018-04-10 NOTE — [facility] Suicide Risk Assessment (Signed)
[facility] Admission Suicide Risk Assessment   Nursing information obtained from:  Patient Demographic factors:  Caucasian, Low socioeconomic status, Living alone, Unemployed Current Mental Status:  Suicidal ideation indicated by patient, Suicide plan, Self-harm thoughts Loss Factors:  Decrease in vocational status, Loss of significant relationship, Financial problems / change in socioeconomic status Historical Factors:  Prior suicide attempts, Impulsivity Risk Reduction Factors:  Responsible for children under 54 years of age, Sense of responsibility to family, Positive therapeutic relationship  Total Time spent with patient: 1 hour Principal Problem: MDD (major depressive disorder), recurrent severe, without psychosis (HCC) Diagnosis:  Principal Problem:   MDD (major depressive disorder), recurrent severe, without psychosis (HCC) Active Problems:   Hypothyroidism   Overdose  Subjective Data: overdose  Continued Clinical Symptoms:  Alcohol Use Disorder Identification Test Final Score (AUDIT): 4 The "Alcohol Use Disorders Identification Test", Guidelines for Use in Primary Care, Second Edition.  World Science writer [facility]). Score between 0-7:  no or low risk or alcohol related problems. Score between 8-15:  moderate risk of alcohol related problems. Score between 16-19:  high risk of alcohol related problems. Score 20 or above:  warrants further diagnostic evaluation for alcohol dependence and treatment.   CLINICAL FACTORS:   Bipolar Disorder:   Depressive phase Depression:   Impulsivity   Musculoskeletal: Strength & Muscle Tone: within normal limits Gait & Station: normal Patient leans: N/A  Psychiatric Specialty Exam: Physical Exam  Nursing note and vitals reviewed. Psychiatric: She has a normal mood and affect. Her speech is normal and behavior is normal. Thought content normal. Cognition and memory are normal. She expresses impulsivity.    Review of Systems   Neurological: Negative.   Psychiatric/Behavioral: Negative.   All other systems reviewed and are negative.   Blood pressure 98/75, pulse 99, temperature 98.4 F (36.9 C), temperature source Oral, resp. rate 18, height 5' (1.524 m), weight 63.5 kg, SpO2 96 %.Body mass index is 27.34 kg/m.  General Appearance: Casual  Eye Contact:  Good  Speech:  Clear and Coherent  Volume:  Normal  Mood:  Euthymic  Affect:  Tearful  Thought Process:  Goal Directed and Descriptions of Associations: Intact  Orientation:  Full (Time, Place, and Person)  Thought Content:  WDL  Suicidal Thoughts:  No  Homicidal Thoughts:  No  Memory:  Immediate;   Fair Recent;   Fair Remote;   Fair  Judgement:  Poor  Insight:  Shallow  Psychomotor Activity:  Normal  Concentration:  Concentration: Fair and Attention Span: Fair  Recall:  [employer] of Knowledge:  Fair  Language:  Fair  Akathisia:  No  Handed:  Right  AIMS (if indicated):     Assets:  Communication Skills Desire for Improvement Financial Resources/Insurance Physical Health Resilience Social Support  ADL's:  Intact  Cognition:  WNL  Sleep:  Number of Hours: 6.5      COGNITIVE FEATURES THAT CONTRIBUTE TO RISK:  None    SUICIDE RISK:   Minimal: No identifiable suicidal ideation.  Patients presenting with no risk factors but with morbid ruminations; may be classified as minimal risk based on the severity of the depressive symptoms  PLAN OF CARE: hospital admission, medication management, discharge planning.  [name] is a48 year old female with a history of bipolar disorder transferred from [facility] medical floor where she was hospitalized after overdose on medication on 04/03/2018 in the context of severe social stressors.  #Suicidal ideation -patient adamantly denies any thoughts, intentions or plans to hurt herself or  others  #Mood -continue Lamictal 200 mg nightly -Wellbutrin 300 mg daily -Viibryd 20 mg daily -Trazodone 100 mg nightly  PRN  #Hypothyroidism -Armour thyroid 120 mg daily  #Smoking cessation -nicotine patch is available  #Disposition -discharge to home -follow up with [facility]    I certify that inpatient services furnished can reasonably be expected to improve the patient's condition.   Kristine Linea, MD 04/10/2018, 1:57 PM

## 2018-04-10 NOTE — B[facility] Group Notes (Signed)
  LCSW Group Therapy Note  04/10/2018 2:19 PM   Type of Therapy/Topic:  Group Therapy:  Feelings about Diagnosis  Participation Level:  Did Not Attend. Pt invited, chose not to attend.   Description of Group:   This group will allow patients to explore their thoughts and feelings about diagnoses they have received. Patients will be guided to explore their level of understanding and acceptance of these diagnoses. Facilitator will encourage patients to process their thoughts and feelings about the reactions of others to their diagnosis and will guide patients in identifying ways to discuss their diagnosis with significant others in their lives. This group will be process-oriented, with patients participating in exploration of their own experiences, giving and receiving support, and processing challenge from other group members.   Therapeutic Goals: 1. Patient will demonstrate understanding of diagnosis as evidenced by identifying two or more symptoms of the disorder 2. Patient will be able to express two feelings regarding the diagnosis 3. Patient will demonstrate their ability to communicate their needs through discussion and/or role play  Summary of Patient Progress: x   Therapeutic Modalities:   Cognitive Behavioral Therapy Brief Therapy Feelings Identification    Iris Pert, MSW, LCSW Clinical Social Work 04/10/2018 2:19 PM

## 2018-04-10 NOTE — H&P (Signed)
Psychiatric Admission Assessment Adult  Patient Identification: Tina [name] MRN:  [id number] Date of Evaluation:  04/10/2018 Chief Complaint:  Depression Principal Diagnosis: MDD (major depressive disorder), recurrent severe, without psychosis (HCC) Diagnosis:  Principal Problem:   MDD (major depressive disorder), recurrent severe, without psychosis (HCC) Active Problems:   Hypothyroidism   Overdose  History of Present Illness:   Identifying data. Tina [name] is a 48 year old female with a history of bipolar disorder.  Chief complaint. "It was deliberate."  History of present illness. Information was obtained from the patient and the chart. The [facility] where she was hospitalized after overdose on 04/03/2018 on multiple medications. The patient has been divorced for over six years and has joined custody of her 3 children. She was doing well, working and able to buy a house independently, until she had another bout of Lyme disease. She lost employment and is about to foreclose. 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 Because of partial employment, she lost $200 in food stamps. She does not receive alimony but the kids get child support. She has been to court recently to learn that she can not file for bankruptcy under chapter 11 (has not enough money). She has support from her ex-husband who remarried and has a baby now, her mother and her sister but became overwhelmed with her situation and overdosed. She alarmed but one friend, who did not respond I time. She was found by her sister after the patient did not show up to pick up her kids. She has been a patient at the [facility] where she sees a psychiatrist and a therapist. Happy with services and compliant with medications. She denies psychotic symptoms or symptoms suggestive of bipolar mania. No drugs or alcohol involved.  On my interview, the patient is very  peasant and polite. She is slightly tearful talking about her troubles but no longer suicidal or homicidal. I shared a letter I received from her mother that is rather alarmist suggesting that the patient is going to kill herself foe sure at discharge, suggesting long term hospitalization. The patient will consider family meeting but for now, I do not have permission to talk to the family. They are not allowed to visit or call.  Past psychiatric history. First hospitalization at the age of 89. Lifelong struggle with bipolar. She was doing better when in stable marriage but left her husband years ago. Multiple hospitalizations and medication trials. Severel suicide attempt by overdose and one by cutting. Happy with her current regimen and care. There were several manic episodes in addition to depression. In 2017, she went missing for one month, found in another state.  Family psychiatric history. None.  Social history. Graduated from college with a degree in [place]. Has been gainfully employed since divorce until second bout of Lyme disease. Has Medicaid.  Total Time spent with patient: 1 hour  Is the patient at risk to self? No.  Has the patient been a risk to self in the past 6 months? Yes.    Has the patient been a risk to self within the distant past? Yes.    Is the patient a risk to others? No.  Has the patient been a risk to others in the past 6 months? No.  Has the patient been a risk to others within the distant past? No.   Prior Inpatient Therapy:   Prior Outpatient Therapy:    Alcohol Screening: 1.  How often do you have a drink containing alcohol?: Never 2. How many drinks containing alcohol do you have on a typical day when you are drinking?: 3 or 4 3. How often do you have six or more drinks on one occasion?: Weekly AUDIT-C Score: 4 4. How often during the last year have you found that you were not able to stop drinking once you had started?: Never 5. How often during  the last year have you failed to do what was normally expected from you becasue of drinking?: Never 6. How often during the last year have you needed a first drink in the morning to get yourself going after a heavy drinking session?: Never 7. How often during the last year have you had a feeling of guilt of remorse after drinking?: Never 8. How often during the last year have you been unable to remember what happened the night before because you had been drinking?: Never 9. Have you or someone else been injured as a result of your drinking?: No 10. Has a relative or friend or a doctor or another health worker been concerned about your drinking or suggested you cut down?: No Alcohol Use Disorder Identification Test Final Score (AUDIT): 4 Alcohol Brief Interventions/Follow-up: AUDIT Score <7 follow-up not indicated Substance Abuse History in the last 12 months:  No. Consequences of Substance Abuse: NA Previous Psychotropic Medications: Yes  Psychological Evaluations: No  Past Medical History:  Past Medical History:  Diagnosis Date  . Acne   . Adrenal insufficiency (HCC)   . Bipolar 2 disorder (HCC)   . Cellulitis   . Depression   . Genital warts   . Headache   . History of chicken pox   . Hx of Lyme disease   . Hypothyroidism     Past Surgical History:  Procedure Laterality Date  . CESAREAN SECTION  2003, 2007, 2011  . INGUINAL HERNIA REPAIR Bilateral   . TONSILLECTOMY    . UMBILICAL HERNIA REPAIR     Family History:  Family History  Problem Relation Age of Onset  . Other Mother        PAD  . Heart disease Other        Grandparent  . Alcohol abuse Paternal Grandmother   . Mental retardation Paternal Grandmother   . Arthritis Maternal Grandfather   . Heart disease Maternal Grandfather     Tobacco Screening: Have you used any form of tobacco in the last 30 days? (Cigarettes, Smokeless Tobacco, Cigars, and/or Pipes): No Social History:  Social History   Substance and  Sexual Activity  Alcohol Use Yes   Comment: social drinker-few drinks a week      Social History   Substance and Sexual Activity  Drug Use No    Additional Social History:                           Allergies:   Allergies  Allergen Reactions  . Stadol [Butorphanol] Other (See Comments)    Dependence  . Vancomycin Itching  . Penicillins Hives and Rash    Pt states she does not have an allergy to penicillin  Did it involve swelling of the face/tongue/throat, SOB, or low BP?N Did it involve sudden or severe rash/hives, skin peeling, or any reaction on the inside of your mouth or nose? Y Did you need to seek medical attention at a hospital or doctor's office?Y When did it last happen?before 2010 If all above  answers are "NO", may proceed with cephalosporin use.   . Sulfa Antibiotics Rash   Lab Results: No results found for this or any previous visit (from the past 48 hour(s)).  Blood Alcohol level:  Lab Results  Component Value Date   ETH <10 04/03/2018   ETH <5 07/08/2016    Metabolic Disorder Labs:  Lab Results  Component Value Date   HGBA1C 5.1 12/03/2014   MPG 100 12/03/2014   MPG 100 12/02/2014   No results found for: PROLACTIN Lab Results  Component Value Date   CHOL 195 12/03/2014   TRIG 122 12/03/2014   HDL 42 12/03/2014   CHOLHDL 4.6 12/03/2014   VLDL 24 12/03/2014   LDLCALC 129 (H) 12/03/2014    Current Medications: Current Facility-Administered Medications  Medication Dose Route Frequency Provider Last Rate Last Dose  . acetaminophen (TYLENOL) tablet 650 mg  650 mg Oral Q6H PRN Mariel Craft, MD   650 mg at 04/09/18 1743  . alum & mag hydroxide-simeth (MAALOX/MYLANTA) 200-200-20 MG/5ML suspension 30 mL  30 mL Oral Q4H PRN Mariel Craft, MD      . buPROPion (WELLBUTRIN XL) 24 hr tablet 300 mg  300 mg Oral Daily Mariel Craft, MD   300 mg at 04/10/18 5597  . lamoTRIgine (LAMICTAL) tablet 200 mg  200 mg Oral Daily Mariel Craft, MD   200 mg at 04/10/18 4163  . lip balm (CARMEX) ointment   Topical PRN Mariel Craft, MD      . magnesium hydroxide (MILK OF MAGNESIA) suspension 30 mL  30 mL Oral Daily PRN Mariel Craft, MD      . nicotine (NICODERM CQ - dosed in mg/24 hours) patch 21 mg  21 mg Transdermal Daily Mariel Craft, MD   Stopped at 04/10/18 [id number]  . thyroid (ARMOUR) tablet 120 mg  120 mg Oral Daily Mariel Craft, MD   120 mg at 04/10/18 6468  . traZODone (DESYREL) tablet 100 mg  100 mg Oral QHS PRN Mariel Craft, MD   100 mg at 04/09/18 2141  . Vilazodone HCl TABS 20 mg  20 mg Oral Daily Mariel Craft, MD   20 mg at 04/10/18 0840   PTA Medications: Medications Prior to Admission  Medication Sig Dispense Refill Last Dose  . acetaminophen (TYLENOL) 325 MG tablet Take 2 tablets (650 mg total) by mouth every 6 (six) hours as needed for mild pain or fever.     Mack Guise THYROID 120 MG tablet Take 120 mg by mouth daily.   04/03/2018 at Unknown time  . buPROPion (WELLBUTRIN XL) 300 MG 24 hr tablet Take 300 mg by mouth daily. Pt takes a total of 450mg  daily using 150mg  and 300mg  tablets   04/03/2018 at Unknown time  . folic acid (FOLVITE) 1 MG tablet Take 1 tablet (1 mg total) by mouth daily.     Marland Kitchen lamoTRIgine (LAMICTAL) 200 MG tablet Take 200 mg by mouth daily.   04/03/2018 at Unknown time  . nicotine (NICODERM CQ - DOSED IN MG/24 HOURS) 21 mg/24hr patch Place 1 patch (21 mg total) onto the skin daily. 28 patch 0   . thiamine 100 MG tablet Take 1 tablet (100 mg total) by mouth daily.     . traZODone (DESYREL) 100 MG tablet Take 100 mg by mouth at bedtime.   04/03/2018 at Unknown time  . Vilazodone HCl 20 MG TABS Take 1 tablet (20 mg total) by mouth daily.  7 tablet      Musculoskeletal: Strength & Muscle Tone: within normal limits Gait & Station: normal Patient leans: N/A  Psychiatric Specialty Exam: I reviewed physical exam performed on medical floor and agree with the findings. Physical Exam   Nursing note and vitals reviewed. Psychiatric: She has a normal mood and affect. Her speech is normal and behavior is normal. Thought content normal. Cognition and memory are normal. She expresses impulsivity.    Review of Systems  Neurological: Negative.   Psychiatric/Behavioral: Negative.   All other systems reviewed and are negative.   Blood pressure 98/75, pulse 99, temperature 98.4 F (36.9 C), temperature source Oral, resp. rate 18, height 5' (1.524 m), weight 63.5 kg, SpO2 96 %.Body mass index is 27.34 kg/m.  See SRA                                                  Sleep:  Number of Hours: 6.5    Treatment Plan Summary: Daily contact with patient to assess and evaluate symptoms and progress in treatment and Medication management   Tina [name] is a68 year old female with a history of bipolar disorder transferred from [facility] medical floor where she was hospitalized after overdose on medication on 04/03/2018 in the context of severe social stressors.  #Suicidal ideation -patient adamantly denies any thoughts, intentions or plans to hurt herself or others  #Mood -continue Lamictal 200 mg nightly -Wellbutrin 300 mg daily -Viibryd 20 mg daily -Trazodone 100 mg nightly PRN  #Hypothyroidism -Armour thyroid 120 mg daily  #Smoking cessation -nicotine patch is available  #Disposition -discharge to home -follow up with [facility]    Observation Level/Precautions:  15 minute checks  Laboratory:  CBC Chemistry Profile UDS UA  Psychotherapy:    Medications:    Consultations:    Discharge Concerns:    Estimated LOS:  Other:     Physician Treatment Plan for Primary Diagnosis: MDD (major depressive disorder), recurrent severe, without psychosis (HCC) Long Term Goal(s): Improvement in symptoms so as ready for discharge  Short Term Goals: Ability to identify changes in lifestyle to reduce recurrence of condition will improve, Ability to verbalize  feelings will improve, Ability to disclose and discuss suicidal ideas, Ability to demonstrate self-control will improve, Ability to identify and develop effective coping behaviors will improve, Ability to maintain clinical measurements within normal limits will improve and Ability to identify triggers associated with substance abuse/mental health issues will improve  Physician Treatment Plan for Secondary Diagnosis: Principal Problem:   MDD (major depressive disorder), recurrent severe, without psychosis (HCC) Active Problems:   Hypothyroidism   Overdose  Long Term Goal(s): Improvement in symptoms so as ready for discharge  Short Term Goals: NA  I certify that inpatient services furnished can reasonably be expected to improve the patient's condition.    Kristine Linea, MD 1/28/20202:08 PM

## 2018-04-10 NOTE — Progress Notes (Signed)
Recreation Therapy Notes   Date: 04/10/2018  Time: 9:30 am  Location: Craft Room  Behavioral response: Appropriate  Intervention Topic: Communication   Discussion/Intervention:  Group content today was focused on communication. The group defined communication and ways to communicate with others. Individuals stated reason why communication is important and some reasons to communicate with others. Patients expressed if they thought they were good at communicating with others and ways they could improve their communication skills. The group identified important parts of communication and some experiences they have had in the past with communication. The group participated in the intervention "What is that?", where they had a chance to test out their communication skills and identify ways to improve their communication techniques.  Clinical Observations/Feedback:  Patient came to group and defined communication as saying what you feel. She compared communication to a Magazine features editor. Participant explained that communication can prevent assumptions and the unknown. Individual was social with peers and staff while participating in the intervention. Yuvraj Pfeifer LRT/CTRS         Elliyah Liszewski 04/10/2018 11:42 AM

## 2018-04-10 NOTE — BHH Suicide Risk Assessment (Signed)
[facility] Discharge Suicide Risk Assessment   Principal Problem: Bipolar I disorder, most recent episode depressed, severe without psychotic features (HCC) Discharge Diagnoses: Principal Problem:   Bipolar I disorder, most recent episode depressed, severe without psychotic features (HCC) Active Problems:   Hypothyroidism   Overdose   Total Time spent with patient: 20 minutes  Musculoskeletal: Strength & Muscle Tone: within normal limits Gait & Station: normal Patient leans: N/A  Psychiatric Specialty Exam: Review of Systems  Neurological: Negative.   Psychiatric/Behavioral: Negative.   All other systems reviewed and are negative.   Blood pressure 103/75, pulse 75, temperature 98.1 F (36.7 C), temperature source Oral, resp. rate 18, height 5' (1.524 m), weight 63.5 kg, SpO2 98 %.Body mass index is 27.34 kg/m.  General Appearance: Casual  Eye Contact::  Good  Speech:  Clear and Coherent409  Volume:  Normal  Mood:  Anxious  Affect:  Appropriate  Thought Process:  Goal Directed and Descriptions of Associations: Intact  Orientation:  Full (Time, Place, and Person)  Thought Content:  WDL  Suicidal Thoughts:  No  Homicidal Thoughts:  No  Memory:  Immediate;   Good Recent;   Good Remote;   Good  Judgement:  Impaired  Insight:  Present  Psychomotor Activity:  Normal  Concentration:  Fair  Recall:  [employer] of Knowledge:Fair  Language: Fair  Akathisia:  No  Handed:  Right  AIMS (if indicated):     Assets:  Communication Skills Desire for Improvement Financial Resources/Insurance Physical Health Resilience Social Support Transportation  Sleep:  Number of Hours: 7  Cognition: WNL  ADL's:  Intact   Mental Status Per Nursing Assessment::   On Admission:  Suicidal ideation indicated by patient, Suicide plan, Self-harm thoughts  Demographic Factors:  Divorced or widowed, Caucasian, Low socioeconomic status and Unemployed  Loss Factors: Decrease in vocational status,  Decline in physical health, Legal issues and Financial problems/change in socioeconomic status  Historical Factors: Prior suicide attempts and Impulsivity  Risk Reduction Factors:   Responsible for children under 26 years of age, Sense of responsibility to family, Living with another person, especially a relative, Positive social support and Positive therapeutic relationship  Continued Clinical Symptoms:  Bipolar Disorder:   Depressive phase  Cognitive Features That Contribute To Risk:  None    Suicide Risk:  Minimal: No identifiable suicidal ideation.  Patients presenting with no risk factors but with morbid ruminations; may be classified as minimal risk based on the severity of the depressive symptoms  Follow-up Information    [facility] Follow up on 04/16/2018.   Specialty:  Behavioral Health Why:  You have an appointment scheduled with Shanda Bumps on Monday 04/16/2018 at 11:00. Please discuss need for medication management follow-up with psychiatrist at appointment with Shanda Bumps. Thank you! Contact information: [address] [place] [address] [phone]           Plan Of Care/Follow-up recommendations:  Activity:  as tolerated Diet:  regular Other:  keep follow up appointments  Kristine Linea, MD 04/11/2018, 10:07 AM
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
## 2018-04-11 NOTE — Progress Notes (Signed)
Pt was isolative in her room reading a book most of the shift. Pt stated she been feeling anxious about her possible D/C because did not want to deal with her mother and sister. Pt also wanted to talk to social worker to arrange transportation her to get home since did not want her mother to take her home. She denies any suicide ideation and contracts for safety.  Currently resting in bed with eyes close and respiration noted. Will continue q 15-minute safety checks.

## 2018-04-11 NOTE — BHH Group Notes (Signed)
LCSW Group Therapy Note  04/11/2018 1:48 PM  Type of Therapy/Topic:  Group Therapy:  Emotion Regulation  Participation Level:  Did Not Attend. Pt invited, chose not to attend.   Description of Group:   The purpose of this group is to assist patients in learning to regulate negative emotions and experience positive emotions. Patients will be guided to discuss ways in which they have been vulnerable to their negative emotions. These vulnerabilities will be juxtaposed with experiences of positive emotions or situations, and patients will be challenged to use positive emotions to combat negative ones. Special emphasis will be placed on coping with negative emotions in conflict situations, and patients will process healthy conflict resolution skills.  Therapeutic Goals: 1. Patient will identify two positive emotions or experiences to reflect on in order to balance out negative emotions 2. Patient will label two or more emotions that they find the most difficult to experience 3. Patient will demonstrate positive conflict resolution skills through discussion and/or role plays  Summary of Patient Progress: x   Therapeutic Modalities:   Cognitive Behavioral Therapy Feelings Identification Dialectical Behavioral Therapy   Tina [name], MSW, LCSW Clinical Social Work 04/11/2018 1:48 PM

## 2018-04-11 NOTE — Progress Notes (Signed)
Patient discharged home via [facility]. Patient alert, oriented and ambulatory at discharge. Patient AVS/Follow-up and medications to continue reviewed with her and written copy given to patient. Patient verbalized understanding. Patient given note for work. Patient denies SI/HI and A/V hallucinations at discharge. Patient vitals 98.9-122/81-70-18-100% room air at discharge. All patient belongings returned to her. Patient escorted off unit by staff.

## 2018-04-11 NOTE — Plan of Care (Signed)
D: Pt denies SI/HI/AV hallucinations. Pt is pleasant and cooperative. Pt rates her depression 1/10, hopelessness 0/10 and anxiety 3/10. Patient goal for the day is to work on transportation home when discharged and talk with Child psychotherapistsocial worker. A: Pt was offered support and encouragement. Pt was given scheduled medications. Pt was encourage to attend groups. Q 15 minute checks were done for safety.  R:Pt attends groups and interacts well with peers and staff. Pt is taking medication. Pt has no complaints.Pt receptive to treatment and safety maintained on unit.    Problem: Education: Goal: Knowledge of the prescribed therapeutic regimen will improve Outcome: Progressing   Problem: Activity: Goal: Imbalance in normal sleep/wake cycle will improve Outcome: Progressing   Problem: Coping: Goal: Coping ability will improve Outcome: Progressing Goal: Will verbalize feelings Outcome: Progressing   Problem: Health Behavior/Discharge Planning: Goal: Compliance with therapeutic regimen will improve Outcome: Progressing   Problem: Safety: Goal: Ability to disclose and discuss suicidal ideas will improve Outcome: Progressing   Problem: Self-Concept: Goal: Level of anxiety will decrease Outcome: Progressing

## 2018-04-11 NOTE — Progress Notes (Signed)
  [facility] Adult Case Management Discharge Plan :  Will you be returning to the same living situation after discharge:  Yes,  home At discharge, do you have transportation home?: Yes,  pt plans to take a taxi Do you have the ability to pay for your medications: Yes,  [facility]  Release of information consent forms completed and in the chart;   Patient to Follow up at: Follow-up Information    [facility] Follow up on 04/16/2018.   Specialty:  Behavioral Health Why:  You have an appointment scheduled with Shanda Bumps on Monday 04/16/2018 at 11:00. Please discuss need for medication management follow-up with psychiatrist at appointment with Shanda Bumps. Thank you! Contact information: [address] [place] [address] [phone]           Next level of care provider has access to [facility] Link:no  Safety Planning and Suicide Prevention discussed: Yes,  SPE completed with pt as pt refused collateral contact.   Have you used any form of tobacco in the last 30 days? (Cigarettes, Smokeless Tobacco, Cigars, and/or Pipes): No  Has patient been referred to the Quitline?: Patient refused referral  Patient has been referred for addiction treatment: N/A  Mechele Dawley, LCSW 04/11/2018, 11:15 AM

## 2018-04-11 NOTE — Tx Team (Addendum)
Interdisciplinary Treatment and Diagnostic Plan Update  04/11/2018 Time of Session: 1030am Tina [name] MRN: [id number]  Principal Diagnosis: Bipolar I disorder, most recent episode depressed, severe without psychotic features (HCC)  Secondary Diagnoses: Principal Problem:   Bipolar I disorder, most recent episode depressed, severe without psychotic features (HCC) Active Problems:   Hypothyroidism   Overdose   Current Medications:  Current Facility-Administered Medications  Medication Dose Route Frequency Provider Last Rate Last Dose  . acetaminophen (TYLENOL) tablet 650 mg  650 mg Oral Q6H PRN Mariel CraftMaurer, Sheila M, MD   650 mg at 04/10/18 1805  . alum & mag hydroxide-simeth (MAALOX/MYLANTA) 200-200-20 MG/5ML suspension 30 mL  30 mL Oral Q4H PRN Mariel CraftMaurer, Sheila M, MD      . buPROPion (WELLBUTRIN XL) 24 hr tablet 300 mg  300 mg Oral Daily Mariel CraftMaurer, Sheila M, MD   300 mg at 04/11/18 0954  . lamoTRIgine (LAMICTAL) tablet 200 mg  200 mg Oral Daily Mariel CraftMaurer, Sheila M, MD   200 mg at 04/11/18 0954  . lip balm (CARMEX) ointment   Topical PRN Mariel CraftMaurer, Sheila M, MD      . magnesium hydroxide (MILK OF MAGNESIA) suspension 30 mL  30 mL Oral Daily PRN Mariel CraftMaurer, Sheila M, MD      . nicotine (NICODERM CQ - dosed in mg/24 hours) patch 21 mg  21 mg Transdermal Daily Mariel CraftMaurer, Sheila M, MD   Stopped at 04/10/18 [phone]  . thyroid (ARMOUR) tablet 120 mg  120 mg Oral Daily Mariel CraftMaurer, Sheila M, MD   120 mg at 04/11/18 0954  . traZODone (DESYREL) tablet 100 mg  100 mg Oral QHS PRN Mariel CraftMaurer, Sheila M, MD   100 mg at 04/10/18 2205  . Vilazodone HCl TABS 20 mg  20 mg Oral Daily Mariel CraftMaurer, Sheila M, MD   20 mg at 04/11/18 [id number]   PTA Medications: Medications Prior to Admission  Medication Sig Dispense Refill Last Dose  . acetaminophen (TYLENOL) 325 MG tablet Take 2 tablets (650 mg total) by mouth every 6 (six) hours as needed for mild pain or fever.     Mack Guise. ARMOUR THYROID 120 MG tablet Take 120 mg by mouth daily.   04/03/2018 at  Unknown time  . buPROPion (WELLBUTRIN XL) 300 MG 24 hr tablet Take 300 mg by mouth daily. Pt takes a total of 450mg  daily using 150mg  and 300mg  tablets   04/03/2018 at Unknown time  . folic acid (FOLVITE) 1 MG tablet Take 1 tablet (1 mg total) by mouth daily.     Marland Kitchen. lamoTRIgine (LAMICTAL) 200 MG tablet Take 200 mg by mouth daily.   04/03/2018 at Unknown time  . nicotine (NICODERM CQ - DOSED IN MG/24 HOURS) 21 mg/24hr patch Place 1 patch (21 mg total) onto the skin daily. 28 patch 0   . thiamine 100 MG tablet Take 1 tablet (100 mg total) by mouth daily.     . traZODone (DESYREL) 100 MG tablet Take 100 mg by mouth at bedtime.   04/03/2018 at Unknown time  . Vilazodone HCl 20 MG TABS Take 1 tablet (20 mg total) by mouth daily. 7 tablet      Patient Stressors: Financial difficulties Marital or family conflict Occupational concerns  Patient Strengths: Average or above average intelligence Capable of independent living Supportive family/friends  Treatment Modalities: Medication Management, Group therapy, Case management,  1 to 1 session with clinician, Psychoeducation, Recreational therapy.   Physician Treatment Plan for Primary Diagnosis: Bipolar I disorder, most recent episode depressed, severe  without psychotic features (HCC) Long Term Goal(s): Improvement in symptoms so as ready for discharge Improvement in symptoms so as ready for discharge   Short Term Goals: Ability to identify changes in lifestyle to reduce recurrence of condition will improve Ability to verbalize feelings will improve Ability to disclose and discuss suicidal ideas Ability to demonstrate self-control will improve Ability to identify and develop effective coping behaviors will improve Ability to maintain clinical measurements within normal limits will improve Ability to identify triggers associated with substance abuse/mental health issues will improve NA  Medication Management: Evaluate patient's response, side  effects, and tolerance of medication regimen.  Therapeutic Interventions: 1 to 1 sessions, Unit Group sessions and Medication administration.  Evaluation of Outcomes: Adequate for Discharge  Physician Treatment Plan for Secondary Diagnosis: Principal Problem:   Bipolar I disorder, most recent episode depressed, severe without psychotic features (HCC) Active Problems:   Hypothyroidism   Overdose  Long Term Goal(s): Improvement in symptoms so as ready for discharge Improvement in symptoms so as ready for discharge   Short Term Goals: Ability to identify changes in lifestyle to reduce recurrence of condition will improve Ability to verbalize feelings will improve Ability to disclose and discuss suicidal ideas Ability to demonstrate self-control will improve Ability to identify and develop effective coping behaviors will improve Ability to maintain clinical measurements within normal limits will improve Ability to identify triggers associated with substance abuse/mental health issues will improve NA     Medication Management: Evaluate patient's response, side effects, and tolerance of medication regimen.  Therapeutic Interventions: 1 to 1 sessions, Unit Group sessions and Medication administration.  Evaluation of Outcomes: Adequate for Discharge   RN Treatment Plan for Primary Diagnosis: Bipolar I disorder, most recent episode depressed, severe without psychotic features (HCC) Long Term Goal(s): Knowledge of disease and therapeutic regimen to maintain health will improve  Short Term Goals: Ability to demonstrate self-control, Ability to verbalize feelings will improve, Ability to disclose and discuss suicidal ideas and Ability to identify and develop effective coping behaviors will improve  Medication Management: RN will administer medications as ordered by provider, will assess and evaluate patient's response and provide education to patient for prescribed medication. RN will report  any adverse and/or side effects to prescribing provider.  Therapeutic Interventions: 1 on 1 counseling sessions, Psychoeducation, Medication administration, Evaluate responses to treatment, Monitor vital signs and CBGs as ordered, Perform/monitor CIWA, COWS, AIMS and Fall Risk screenings as ordered, Perform wound care treatments as ordered.  Evaluation of Outcomes: Adequate for Discharge   LCSW Treatment Plan for Primary Diagnosis: Bipolar I disorder, most recent episode depressed, severe without psychotic features (HCC) Long Term Goal(s): Safe transition to appropriate next level of care at discharge, Engage patient in therapeutic group addressing interpersonal concerns.  Short Term Goals: Engage patient in aftercare planning with referrals and resources, Increase social support, Increase ability to appropriately verbalize feelings and Increase emotional regulation  Therapeutic Interventions: Assess for all discharge needs, 1 to 1 time with Social worker, Explore available resources and support systems, Assess for adequacy in community support network, Educate family and significant other(s) on suicide prevention, Complete Psychosocial Assessment, Interpersonal group therapy.  Evaluation of Outcomes: Adequate for Discharge   Progress in Treatment: Attending groups: No. Participating in groups: No. Taking medication as prescribed: Yes. Toleration medication: Yes. Family/Significant other contact made: No, pt declined collateral contact. Patient understands diagnosis: Yes. Discussing patient identified problems/goals with staff: Yes. Medical problems stabilized or resolved: Yes. Denies suicidal/homicidal ideation: Yes. Issues/concerns per  patient self-inventory: No. Other: n/a  New problem(s) identified: No, Describe:  n/a  New Short Term/Long Term Goal(s): , medication management for mood stabilization; development of comprehensive mental wellness/sobriety plan.    Patient Goals:   "To get some rest, get back home to my cat, and see my kids"  Discharge Plan or Barriers: Pt plans to call a taxi and go to her sister's house to retrieve her keys for her car and home.   Reason for Continuation of Hospitalization: none  Estimated Length of Stay: Today 04/11/2018  Attendees: Patient: Tina [name] 04/11/2018 11:20 AM  Physician: Dr. Jennet Maduro, MD 04/11/2018 11:20 AM  Nursing:  04/11/2018 11:20 AM  RN Care Manager: 04/11/2018 11:20 AM  Social Worker: Iris Pert, LCSW 04/11/2018 11:20 AM  Recreational Therapist: Garret Reddish, Drue Flirt, LRT  04/11/2018 11:20 AM  Other: Penni Homans, LCSW 04/11/2018 11:20 AM  Other: Lowella Dandy, LCSW 04/11/2018 11:20 AM  Other: 04/11/2018 11:20 AM    Scribe for Treatment Team: Charlann Lange Iria Jamerson, LCSW 04/11/2018 11:20 AM

## 2018-04-13 ENCOUNTER — Telehealth (HOSPITAL_COMMUNITY): Payer: Self-pay

## 2018-04-13 ENCOUNTER — Other Ambulatory Visit (HOSPITAL_COMMUNITY): Payer: Self-pay

## 2018-04-13 MED ORDER — LAMOTRIGINE 200 MG PO TABS: 200.0000 mg | ORAL_TABLET | Freq: Every day | ORAL | 0 refills | Status: DC

## 2018-04-13 MED ORDER — BUPROPION HCL ER (XL) 300 MG PO TB24: 300.0000 mg | ORAL_TABLET | Freq: Every day | ORAL | 0 refills | Status: DC

## 2018-04-13 MED ORDER — VILAZODONE HCL 20 MG PO TABS: 20.0000 mg | ORAL_TABLET | Freq: Every day | ORAL | 0 refills | Status: DC

## 2018-04-13 MED ORDER — TRAZODONE HCL 100 MG PO TABS: 100.0000 mg | ORAL_TABLET | Freq: Every day | ORAL | 0 refills | Status: DC

## 2018-04-13 NOTE — Telephone Encounter (Signed)
Patient called our office about medication refills. I reviewed her chart and advised her that she is a patient at [facility] and should call them. Patient informed me that due to the overdose she was discharged from the practice. I called Dr. Jennet Maduro at [facility] as she was the discharging doctor. I explained the situation and let her know that patient would be scheduling her care with [place]. Dr. Jennet Maduro agreed to give the patient a 30 day supply of the Wellbutrin, Trazodone and the Lamictal. These were called into the pharmacy for the patient and she was advised.

## 2018-04-21 ENCOUNTER — Ambulatory Visit (INDEPENDENT_AMBULATORY_CARE_PROVIDER_SITE_OTHER): Payer: Medicaid Other | Admitting: Psychiatry

## 2018-04-21 ENCOUNTER — Encounter (HOSPITAL_COMMUNITY): Payer: Self-pay | Admitting: Psychiatry

## 2018-04-21 ENCOUNTER — Other Ambulatory Visit: Payer: Self-pay

## 2018-04-21 VITALS — BP 129/78 | HR 78 | Temp 98.8°F | Wt 138.8 lb

## 2018-04-21 DIAGNOSIS — F339 Major depressive disorder, recurrent, unspecified: Secondary | ICD-10-CM

## 2018-04-21 DIAGNOSIS — F3160 Bipolar disorder, current episode mixed, unspecified: Secondary | ICD-10-CM

## 2018-04-21 MED ORDER — TRAZODONE HCL 100 MG PO TABS: 100.0000 mg | ORAL_TABLET | Freq: Every day | ORAL | 2 refills | Status: DC

## 2018-04-21 MED ORDER — LAMOTRIGINE 200 MG PO TABS: 200.0000 mg | ORAL_TABLET | Freq: Every day | ORAL | 2 refills | Status: DC

## 2018-04-21 MED ORDER — BUPROPION HCL ER (XL) 300 MG PO TB24: 300.0000 mg | ORAL_TABLET | Freq: Every day | ORAL | 2 refills | Status: DC

## 2018-04-21 MED ORDER — VILAZODONE HCL 20 MG PO TABS: 20.0000 mg | ORAL_TABLET | Freq: Every day | ORAL | 2 refills | Status: DC
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
## 2018-05-16 ENCOUNTER — Ambulatory Visit (HOSPITAL_COMMUNITY): Payer: Medicaid Other | Admitting: Psychiatry

## 2018-05-18 ENCOUNTER — Ambulatory Visit (HOSPITAL_COMMUNITY): Payer: Medicaid Other | Admitting: Psychiatry

## 2018-05-23 ENCOUNTER — Ambulatory Visit (INDEPENDENT_AMBULATORY_CARE_PROVIDER_SITE_OTHER): Payer: Medicaid Other | Admitting: Psychiatry

## 2018-05-23 ENCOUNTER — Encounter (HOSPITAL_COMMUNITY): Payer: Self-pay | Admitting: Psychiatry

## 2018-05-23 ENCOUNTER — Ambulatory Visit (HOSPITAL_COMMUNITY): Payer: Medicaid Other | Admitting: Psychiatry

## 2018-05-23 ENCOUNTER — Other Ambulatory Visit: Payer: Self-pay

## 2018-05-23 VITALS — BP 122/70 | Ht 60.0 in | Wt 136.0 lb

## 2018-05-23 DIAGNOSIS — F3177 Bipolar disorder, in partial remission, most recent episode mixed: Secondary | ICD-10-CM

## 2018-05-23 DIAGNOSIS — F909 Attention-deficit hyperactivity disorder, unspecified type: Secondary | ICD-10-CM

## 2018-05-23 MED ORDER — AMPHETAMINE-DEXTROAMPHETAMINE 20 MG PO TABS: 20.0000 mg | ORAL_TABLET | Freq: Two times a day (BID) | ORAL | 0 refills | Status: DC
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
## 2018-07-03 ENCOUNTER — Telehealth (HOSPITAL_COMMUNITY): Payer: Self-pay

## 2018-07-03 ENCOUNTER — Other Ambulatory Visit (HOSPITAL_COMMUNITY): Payer: Self-pay | Admitting: Psychiatry

## 2018-07-03 MED ORDER — AMPHETAMINE-DEXTROAMPHETAMINE 20 MG PO TABS: 20.0000 mg | ORAL_TABLET | Freq: Two times a day (BID) | ORAL | 0 refills | Status: DC

## 2018-07-03 NOTE — Telephone Encounter (Signed)
Patient called about her Adderall. Two of the transmissions to the pharmacy failed. Could you please resend them? Thank you.

## 2018-07-03 NOTE — Telephone Encounter (Signed)
I did resend it.  I also ordered Ambien for a female patient with PTSD who called today that Belsomra is not working. He is on maximum dose of Cymbalta 120 mg so I will jot increase dose. I deleted by mistake your note before I responded and I only remember that his name is Tina [name].  OP.

## 2018-07-05 ENCOUNTER — Ambulatory Visit (INDEPENDENT_AMBULATORY_CARE_PROVIDER_SITE_OTHER): Payer: Medicaid Other | Admitting: Psychiatry

## 2018-07-05 ENCOUNTER — Other Ambulatory Visit: Payer: Self-pay

## 2018-07-05 DIAGNOSIS — F3178 Bipolar disorder, in full remission, most recent episode mixed: Secondary | ICD-10-CM

## 2018-07-05 DIAGNOSIS — F909 Attention-deficit hyperactivity disorder, unspecified type: Secondary | ICD-10-CM

## 2018-07-05 DIAGNOSIS — F3177 Bipolar disorder, in partial remission, most recent episode mixed: Secondary | ICD-10-CM

## 2018-07-05 MED ORDER — BUPROPION HCL ER (XL) 300 MG PO TB24: 300.0000 mg | ORAL_TABLET | Freq: Every day | ORAL | 0 refills | Status: DC

## 2018-07-05 MED ORDER — LAMOTRIGINE 200 MG PO TABS: 200.0000 mg | ORAL_TABLET | Freq: Every day | ORAL | 0 refills | Status: DC

## 2018-07-05 MED ORDER — TRAZODONE HCL 100 MG PO TABS: 100.0000 mg | ORAL_TABLET | Freq: Every evening | ORAL | 0 refills | Status: DC | PRN
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
## 2018-08-21 ENCOUNTER — Other Ambulatory Visit: Payer: Self-pay

## 2018-08-21 ENCOUNTER — Ambulatory Visit (INDEPENDENT_AMBULATORY_CARE_PROVIDER_SITE_OTHER): Payer: Medicaid Other | Admitting: Psychiatry

## 2018-08-21 DIAGNOSIS — F909 Attention-deficit hyperactivity disorder, unspecified type: Secondary | ICD-10-CM | POA: Diagnosis not present

## 2018-08-21 DIAGNOSIS — F3174 Bipolar disorder, in full remission, most recent episode manic: Secondary | ICD-10-CM | POA: Diagnosis not present

## 2018-08-21 DIAGNOSIS — F3178 Bipolar disorder, in full remission, most recent episode mixed: Secondary | ICD-10-CM

## 2018-08-21 MED ORDER — AMPHETAMINE-DEXTROAMPHETAMINE 20 MG PO TABS: 20.0000 mg | ORAL_TABLET | Freq: Two times a day (BID) | ORAL | 0 refills | Status: DC
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
## 2018-10-02 ENCOUNTER — Other Ambulatory Visit: Payer: Self-pay

## 2018-10-02 ENCOUNTER — Ambulatory Visit (INDEPENDENT_AMBULATORY_CARE_PROVIDER_SITE_OTHER): Payer: Medicaid Other | Admitting: Psychiatry

## 2018-10-02 DIAGNOSIS — F3178 Bipolar disorder, in full remission, most recent episode mixed: Secondary | ICD-10-CM

## 2018-10-02 DIAGNOSIS — F1021 Alcohol dependence, in remission: Secondary | ICD-10-CM | POA: Diagnosis not present

## 2018-10-02 DIAGNOSIS — F909 Attention-deficit hyperactivity disorder, unspecified type: Secondary | ICD-10-CM | POA: Diagnosis not present

## 2018-10-02 DIAGNOSIS — F1221 Cannabis dependence, in remission: Secondary | ICD-10-CM

## 2018-10-02 MED ORDER — AMPHETAMINE-DEXTROAMPHETAMINE 20 MG PO TABS: 20.0000 mg | ORAL_TABLET | Freq: Two times a day (BID) | ORAL | 0 refills | Status: DC

## 2018-10-02 MED ORDER — LAMOTRIGINE 200 MG PO TABS: 200.0000 mg | ORAL_TABLET | Freq: Every day | ORAL | 0 refills | Status: DC

## 2018-10-02 MED ORDER — BUPROPION HCL ER (XL) 300 MG PO TB24: 300.0000 mg | ORAL_TABLET | Freq: Every day | ORAL | 0 refills | Status: DC

## 2018-10-02 MED ORDER — TRAZODONE HCL 100 MG PO TABS: 100.0000 mg | ORAL_TABLET | Freq: Every evening | ORAL | 0 refills | Status: DC | PRN
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
## 2018-12-05 ENCOUNTER — Telehealth (HOSPITAL_COMMUNITY): Payer: Self-pay

## 2018-12-05 NOTE — Telephone Encounter (Signed)
Patient is calling for a refill on her Adderall, [facility] [address]

## 2018-12-06 ENCOUNTER — Other Ambulatory Visit (HOSPITAL_COMMUNITY): Payer: Self-pay | Admitting: Psychiatry

## 2018-12-06 MED ORDER — AMPHETAMINE-DEXTROAMPHETAMINE 20 MG PO TABS: 20.0000 mg | ORAL_TABLET | Freq: Two times a day (BID) | ORAL | 0 refills | Status: DC

## 2018-12-06 NOTE — Telephone Encounter (Signed)
Done

## 2018-12-24 ENCOUNTER — Ambulatory Visit (INDEPENDENT_AMBULATORY_CARE_PROVIDER_SITE_OTHER): Payer: Medicaid Other | Admitting: Psychiatry

## 2018-12-24 ENCOUNTER — Other Ambulatory Visit: Payer: Self-pay

## 2018-12-24 DIAGNOSIS — F909 Attention-deficit hyperactivity disorder, unspecified type: Secondary | ICD-10-CM

## 2018-12-24 DIAGNOSIS — F1221 Cannabis dependence, in remission: Secondary | ICD-10-CM | POA: Diagnosis not present

## 2018-12-24 DIAGNOSIS — F1021 Alcohol dependence, in remission: Secondary | ICD-10-CM

## 2018-12-24 DIAGNOSIS — F3178 Bipolar disorder, in full remission, most recent episode mixed: Secondary | ICD-10-CM

## 2018-12-24 MED ORDER — AMPHETAMINE-DEXTROAMPHETAMINE 20 MG PO TABS: 20.0000 mg | ORAL_TABLET | Freq: Two times a day (BID) | ORAL | 0 refills | Status: DC

## 2018-12-24 MED ORDER — LAMOTRIGINE 200 MG PO TABS: 200.0000 mg | ORAL_TABLET | Freq: Every day | ORAL | 0 refills | Status: DC

## 2018-12-24 MED ORDER — BUPROPION HCL ER (XL) 300 MG PO TB24: 300.0000 mg | ORAL_TABLET | Freq: Every day | ORAL | 0 refills | Status: DC

## 2018-12-24 MED ORDER — TRAZODONE HCL 100 MG PO TABS: 100.0000 mg | ORAL_TABLET | Freq: Every evening | ORAL | 0 refills | Status: DC | PRN

## 2018-12-25 ENCOUNTER — Ambulatory Visit (HOSPITAL_COMMUNITY): Payer: Medicaid Other | Admitting: Psychiatry
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
## 2019-01-11 ENCOUNTER — Other Ambulatory Visit: Payer: Self-pay

## 2019-01-11 ENCOUNTER — Encounter (HOSPITAL_COMMUNITY): Payer: Self-pay | Admitting: Emergency Medicine

## 2019-01-11 ENCOUNTER — Emergency Department (HOSPITAL_COMMUNITY)
Admission: EM | Admit: 2019-01-11 | Discharge: 2019-01-11 | Disposition: A | Discharge status: Home / Self Care | Payer: BC Managed Care – PPO | Attending: Emergency Medicine | Admitting: Emergency Medicine

## 2019-01-11 ENCOUNTER — Emergency Department (HOSPITAL_COMMUNITY): Payer: BC Managed Care – PPO

## 2019-01-11 DIAGNOSIS — R109 Unspecified abdominal pain: Secondary | ICD-10-CM | POA: Diagnosis present

## 2019-01-11 DIAGNOSIS — Z79899 Other long term (current) drug therapy: Secondary | ICD-10-CM | POA: Diagnosis not present

## 2019-01-11 DIAGNOSIS — M791 Myalgia, unspecified site: Secondary | ICD-10-CM | POA: Diagnosis not present

## 2019-01-11 DIAGNOSIS — F17210 Nicotine dependence, cigarettes, uncomplicated: Secondary | ICD-10-CM | POA: Diagnosis not present

## 2019-01-11 DIAGNOSIS — E039 Hypothyroidism, unspecified: Secondary | ICD-10-CM | POA: Insufficient documentation

## 2019-01-11 LAB — COMPREHENSIVE METABOLIC PANEL
ALT: 21 U/L (ref 0–44)
AST: 23 U/L (ref 15–41)
Albumin: 4.2 g/dL (ref 3.5–5.0)
Alkaline Phosphatase: 66 U/L (ref 38–126)
Anion gap: 9 (ref 5–15)
BUN: 16 mg/dL (ref 6–20)
CO2: 22 mmol/L (ref 22–32)
Calcium: 9.2 mg/dL (ref 8.9–10.3)
Chloride: 107 mmol/L (ref 98–111)
Creatinine, Ser: 0.68 mg/dL (ref 0.44–1.00)
GFR calc Af Amer: >60 mL/min (ref >60)
GFR calc non Af Amer: >60 mL/min (ref >60)
Glucose, Bld: 101 mg/dL — ABNORMAL HIGH (ref 70–99)
Potassium: 4.2 mmol/L (ref 3.5–5.1)
Sodium: 138 mmol/L (ref 135–145)
Total Bilirubin: 0.6 mg/dL (ref 0.3–1.2)
Total Protein: 7.3 g/dL (ref 6.5–8.1)

## 2019-01-11 LAB — CBC WITH DIFFERENTIAL/PLATELET
Abs Immature Granulocytes: 0.03 10*3/uL (ref 0.00–0.07)
Basophils Absolute: 0.1 10*3/uL (ref 0.0–0.1)
Basophils Relative: 1 %
Eosinophils Absolute: 0.1 10*3/uL (ref 0.0–0.5)
Eosinophils Relative: 1 %
HCT: 41 % (ref 36.0–46.0)
Hemoglobin: 14 g/dL (ref 12.0–15.0)
Immature Granulocytes: 0 %
Lymphocytes Relative: 18 %
Lymphs Abs: 1.9 10*3/uL (ref 0.7–4.0)
MCH: 32.9 pg (ref 26.0–34.0)
MCHC: 34.1 g/dL (ref 30.0–36.0)
MCV: 96.5 fL (ref 80.0–100.0)
Monocytes Absolute: 0.6 10*3/uL (ref 0.1–1.0)
Monocytes Relative: 6 %
Neutro Abs: 8.2 10*3/uL — ABNORMAL HIGH (ref 1.7–7.7)
Neutrophils Relative %: 74 %
Platelets: 305 10*3/uL (ref 150–400)
RBC: 4.25 MIL/uL (ref 3.87–5.11)
RDW: 11.9 % (ref 11.5–15.5)
WBC: 10.9 10*3/uL — ABNORMAL HIGH (ref 4.0–10.5)
nRBC: 0 % (ref 0.0–0.2)

## 2019-01-11 LAB — I-STAT BETA HCG BLOOD, ED (MC, WL, AP ONLY): I-stat hCG, quantitative: <5 m[IU]/mL (ref <5)

## 2019-01-11 LAB — URINALYSIS, ROUTINE W REFLEX MICROSCOPIC
Bilirubin Urine: NEGATIVE
Glucose, UA: NEGATIVE mg/dL
Ketones, ur: 20 mg/dL — AB
Leukocytes,Ua: NEGATIVE
Nitrite: NEGATIVE
Protein, ur: NEGATIVE mg/dL
Specific Gravity, Urine: 1.01 (ref 1.005–1.030)
pH: 5 (ref 5.0–8.0)

## 2019-01-11 LAB — LIPASE, BLOOD: Lipase: 36 U/L (ref 11–51)

## 2019-01-11 MED ORDER — LIDOCAINE 5 % EX PTCH: 1.0000 | MEDICATED_PATCH | CUTANEOUS | 0 refills | Status: DC

## 2019-01-11 MED ORDER — ONDANSETRON 4 MG PO TBDP: 4.0000 mg | ORAL_TABLET | Freq: Three times a day (TID) | ORAL | 0 refills | Status: DC | PRN

## 2019-01-11 MED ORDER — KETOROLAC TROMETHAMINE 30 MG/ML IJ SOLN
30.0000 mg | Freq: Once | INTRAMUSCULAR | Status: AC
  Administered 2019-01-11: 30 mg via INTRAVENOUS
  Filled 2019-01-11: qty 1

## 2019-01-11 MED ORDER — NAPROXEN 500 MG PO TABS: 500.0000 mg | ORAL_TABLET | Freq: Two times a day (BID) | ORAL | 0 refills | Status: DC

## 2019-01-11 NOTE — Discharge Instructions (Signed)
Take the medications as prescribed  Return for new or worsening symptoms 

## 2019-01-11 NOTE — ED Triage Notes (Signed)
Pt c/o left flank pains that been ongoing for 3-4 days. Feels nauseated.

## 2019-01-11 NOTE — ED Provider Notes (Signed)
[facility]-EMERGENCY DEPT Provider Note   CSN: [id number] Arrival date & time: 01/11/19  0919     History   Chief Complaint Chief Complaint  Patient presents with  . Flank Pain  . Nausea    HPI Kaysea Raya is a 48 y.o. female with past medical history significant for hypothyroidism, bipolar disorder who presents for evaluation of left flank pain.  Patient states she has had pain located to her left flank x3 days.  Symptoms worse with movement.  Patient states if she lays flat her symptoms disappear.  She has been taking Tylenol and ibuprofen without leaf of her pain.  She denies any new injury or trauma.  Denies prior history of kidney stones.  Denies fever, chills, nausea, vomiting, chest pain, shortness of breath, hemoptysis, abdominal pain, diarrhea, dysuria, hematuria.  Pain does not radiate.  With movement her pain is an 8/10 she lays still her pain is a 0/10.  No unilateral leg swelling, redness, warmth.  No history of PE or DVT.  Denies additional aggravating or alleviating factors.  History of pain from patient and past medical records. No interpreter was used.     HPI  Past Medical History:  Diagnosis Date  . Acne   . Adrenal insufficiency (HCC)   . Bipolar 2 disorder (HCC)   . Cellulitis   . Depression   . Genital warts   . Headache   . History of chicken pox   . Hx of Lyme disease   . Hypothyroidism     Patient Active Problem List   Diagnosis Date Noted  . Bipolar 1 disorder, mixed, full remission (HCC) 07/05/2018  . Adult ADHD 05/23/2018  . Suicide attempt (HCC)   . Overdose 04/03/2018  . Tobacco use disorder 04/03/2018  . ASCUS with positive high risk HPV cervical 07/31/2017  . Rash and nonspecific skin eruption 07/14/2016  . Tegretol toxicity 07/09/2016  . Electrolyte imbalance 07/09/2016  . Migraine 02/20/2015  . Disease of thyroid gland 06/27/2014  . Opiate misuse 06/27/2014  . Cannabis use disorder, moderate, in  sustained remission (HCC) 06/27/2014  . 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 Alcohol use disorder, severe, in sustained remission (HCC) 06/24/2014  . Adrenal insufficiency (HCC)   . Acne   . Hx of Lyme disease   . 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 Hypothyroidism 12/23/2010  . Myalgia 12/23/2010  . Fatigue 12/23/2010  . Unspecified vitamin deficiency 12/23/2010    Past Surgical History:  Procedure Laterality Date  . CESAREAN SECTION  2003, 2007, 2011  . INGUINAL HERNIA REPAIR Bilateral   . TONSILLECTOMY    . UMBILICAL HERNIA REPAIR       OB History   No obstetric history on file.      Home Medications    Prior to Admission medications   Medication Sig Start Date End Date Taking? Authorizing Provider  amphetamine-dextroamphetamine (ADDERALL) 20 MG tablet Take 1 tablet (20 mg total) by mouth 2 (two) times daily. Patient taking differently: Take 20 mg by mouth 2 (two) times daily as needed (adhd).  01/05/19 02/04/19 Yes Pucilowski, Roosvelt Maser, MD  ARMOUR THYROID 120 MG tablet Take 120 mg by mouth daily. 02/22/18  Yes [provider]  Aspirin-Salicylamide-Caffeine (BC HEADACHE POWDER PO) Take 1 packet by mouth as needed (pain/headache).   Yes [provider]  buPROPion (WELLBUTRIN XL) 300 MG 24 hr tablet Take 1 tablet (300 mg total) by mouth daily. 12/24/18 03/24/19 Yes Pucilowski, Roosvelt Maser, MD  cholecalciferol (  VITAMIN D3) 25 MCG (1000 UT) tablet Take 1,000 Units by mouth daily.   Yes [provider]  fluticasone (FLONASE) 50 MCG/ACT nasal spray Place 1-2 sprays into both nostrils daily.   Yes [provider]  ibuprofen (ADVIL) 200 MG tablet Take 800 mg by mouth every 6 (six) hours as needed for fever, headache or moderate pain.   Yes [provider]  liothyronine (CYTOMEL) 5 MCG tablet Take 10 mcg by mouth every morning. 11/28/18  Yes [provider]  progesterone (PROMETRIUM) 100 MG capsule Take 100 mg by mouth at bedtime.  11/27/18  Yes [provider]  traZODone (DESYREL) 100 MG tablet Take 1 tablet (100 mg total) by mouth at bedtime as needed for sleep. 12/24/18 03/24/19 Yes Pucilowski, Olgierd A, MD  lidocaine (LIDODERM) 5 % Place 1 patch onto the skin daily. Remove & Discard patch within 12 hours or as directed by MD 01/11/19   Maurya Nethery A, PA-C  naproxen (NAPROSYN) 500 MG tablet Take 1 tablet (500 mg total) by mouth 2 (two) times daily. 01/11/19   Carney Saxton A, PA-C  ondansetron (ZOFRAN ODT) 4 MG disintegrating tablet Take 1 tablet (4 mg total) by mouth every 8 (eight) hours as needed for nausea or vomiting. 01/11/19   Yaseen Gilberg A, PA-C    Family History Family History  Problem Relation Age of Onset  . Other Mother        PAD  . Heart disease Other        Grandparent  . Alcohol abuse Paternal Grandmother   . Mental retardation Paternal Grandmother   . Arthritis Maternal Grandfather   . Heart disease Maternal Grandfather   . Anxiety disorder Sister   . Depression Sister     Social History Social History   Tobacco Use  . Smoking status: Current Every Day Smoker    Packs/day: 1.00    Years: 29.00    Pack years: 29.00    Types: Cigarettes  . Smokeless tobacco: Never Used  Substance Use Topics  . Alcohol use: Yes    Alcohol/week: 3.0 standard drinks    Types: 3 Shots of liquor per week    Comment: social drinker-few drinks a week   . Drug use: No     Allergies   Stadol [butorphanol] and Vancomycin   Review of Systems Review of Systems  Constitutional: Negative.   HENT: Negative.   Respiratory: Negative.   Cardiovascular: Negative.   Gastrointestinal: Negative.   Genitourinary: Positive for flank pain. Negative for decreased urine volume, difficulty urinating, dysuria, frequency, genital sores, hematuria, menstrual problem, pelvic pain, urgency, vaginal bleeding, vaginal discharge and vaginal pain.  Musculoskeletal: Negative for arthralgias, back pain, gait  problem, neck pain and neck stiffness.  Skin: Negative.   Neurological: Negative.   All other systems reviewed and are negative.    Physical Exam Updated Vital Signs BP (!) 149/83   Pulse 72   Temp 98.7 F (37.1 C) (Oral)   Resp 19   SpO2 100%   Physical Exam Vitals signs and nursing note reviewed.  Constitutional:      General: She is not in acute distress.    Appearance: She is well-developed. She is not ill-appearing, toxic-appearing or diaphoretic.  HENT:     Head: Normocephalic and atraumatic.     Nose: Nose normal.     Mouth/Throat:     Mouth: Mucous membranes are moist.     Pharynx: Oropharynx is clear.  Eyes:  Pupils: Pupils are equal, round, and reactive to light.  Neck:     Musculoskeletal: Normal range of motion.  Cardiovascular:     Rate and Rhythm: Normal rate.     Pulses: Normal pulses.     Heart sounds: Normal heart sounds.  Pulmonary:     Effort: Pulmonary effort is normal. No respiratory distress.     Breath sounds: Normal breath sounds.  Chest:     Chest wall: No mass, deformity, swelling, tenderness, crepitus or edema.  Abdominal:     General: Bowel sounds are normal. There is no distension.     Tenderness: There is no abdominal tenderness. There is no right CVA tenderness, guarding or rebound.  Musculoskeletal: Normal range of motion.     Cervical back: Normal.     Thoracic back: Normal.     Lumbar back: Normal.       Back:     Comments: Moves all 4 extremities without difficulty. Tenderness over posterior left lower ribs, left flank. Pain reproduced to palpation. Full ROM to back without difficulty.  Skin:    General: Skin is warm and dry.     Capillary Refill: Capillary refill takes less than 2 seconds.     Comments: No overlying skin changes. No vesicles, erythema, warmth, fluctuance or induration.   Neurological:     General: No focal deficit present.     Mental Status: She is alert.     Cranial Nerves: Cranial nerves are intact.      Sensory: Sensation is intact.     Motor: Motor function is intact.     Coordination: Coordination is intact.     Gait: Gait is intact.     Comments: CN 2-12 intact.     ED Treatments / Results  Labs (all labs ordered are listed, but only abnormal results are displayed) Labs Reviewed  CBC WITH DIFFERENTIAL/PLATELET - Abnormal; Notable for the following components:      Result Value   WBC 10.9 (*)    Neutro Abs 8.2 (*)    All other components within normal limits  COMPREHENSIVE METABOLIC PANEL - Abnormal; Notable for the following components:   Glucose, Bld 101 (*)    All other components within normal limits  URINALYSIS, ROUTINE W REFLEX MICROSCOPIC - Abnormal; Notable for the following components:   APPearance HAZY (*)    Hgb urine dipstick SMALL (*)    Ketones, ur 20 (*)    Bacteria, UA RARE (*)    All other components within normal limits  URINE CULTURE  LIPASE, BLOOD  I-STAT BETA HCG BLOOD, ED ([facility], [facility], [facility] ONLY)    EKG None  Radiology Ct Renal Stone Study  Result Date: 01/11/2019 CLINICAL DATA:  Left-sided flank pain EXAM: CT ABDOMEN AND PELVIS WITHOUT CONTRAST TECHNIQUE: Multidetector CT imaging of the abdomen and pelvis was performed following the standard protocol without oral or IV contrast. COMPARISON:  None. FINDINGS: Lower chest: There is slight posterior basilar atelectasis. No lung base edema or consolidation evident. On axial slice 10 series 4, there is a 2 mm nodular opacity in the inferior lingula abutting the pleura. Hepatobiliary: No focal liver lesions are evident on this noncontrast enhanced study. The gallbladder wall is not appreciably thickened. There is no biliary duct dilatation. Pancreas: There is no pancreatic mass or inflammatory focus. Spleen: No splenic lesions are evident. Adrenals/Urinary Tract: Adrenals bilaterally appear normal. Kidneys bilaterally show no evident mass or hydronephrosis on either side. There is no appreciable renal or ureteral  calculus on either side. Urinary bladder is midline with wall thickness within normal limits. Stomach/Bowel: There is no appreciable bowel wall or mesenteric thickening. No evident bowel obstruction. Terminal ileum appears unremarkable. There is no evident free air or portal venous air. Vascular/Lymphatic: No abdominal aortic aneurysm. No vascular lesions are demonstrable on this noncontrast enhanced study. There is no appreciable adenopathy in the abdomen or pelvis. Reproductive: Uterus is anteverted. No evident pelvic mass. Tubal ligation clip noted on the right. Other: Appendix appears normal. No abscess or ascites evident in the abdomen or pelvis. There is mild thinning of the rectus muscle in the midline without frank herniation. Musculoskeletal: No blastic or lytic bone lesions. No intramuscular lesions. IMPRESSION: 1. No renal or ureteral calculus. No hydronephrosis. Urinary bladder wall thickness normal. 2. No bowel obstruction. No abscess in the abdomen pelvis. Appendix appears normal. 3.  Tubal ligation clip on the right. 4. 2 mm nodular opacity in the inferior lingula. No follow-up needed if patient is low-risk. Non-contrast chest CT can be considered in 12 months if patient is high-risk. This recommendation follows the consensus statement: Guidelines for Management of Incidental Pulmonary Nodules Detected on CT Images: From the Fleischner Society 2017; Radiology 2017; 284:228-243. Comment: A cause for patient's symptoms has not been established with this study. Electronically Signed   By: Bretta Bang III M.D.   On: 01/11/2019 12:34    Procedures Procedures (including critical care time)  Medications Ordered in ED Medications  ketorolac (TORADOL) 30 MG/ML injection 30 mg (30 mg Intravenous Given 01/11/19 1057)   Initial Impression / Assessment and Plan / ED Course  I have reviewed the triage vital signs and the nursing notes.  Pertinent labs & imaging results that were available during  my care of the patient were reviewed by me and considered in my medical decision making (see chart for details).  48 year old otherwise well presents for evaluation of left flank pain.  She is afebrile, nonseptic, non-ill-appearing.  Pain reproducible to palpation to her left posterior flank, left posterior lower ribs.  She denies any chest pain, shortness of breath, hemoptysis.  She has no risk factors for PE or DVT.  No evidence of DVT on exam.  PERC negative.  Wells criteria low risk.  Pain worse with movement.  She has no midline back pain.  Shared decision making with patient.  Likely feel patient's pain is MSK related or cannot rule out stone.  Given no urinary symptoms I have low suspicion for pyelonephritis.  Patient has elected for CT stone protocol.  CBC with mild leukocytosis at 10.9 Lipase 36 Metabolic panel without electrolyte, renal abnormality Pregnancy test negative Urinalysis with small blood, no evidence of infection.  Will culture.  CT stone without acute findings. Pain managed in ED. toward tolerating p.o. intake.  High suspicion for MSK pain.  Symptomatic management given.  Patient is nontoxic, nonseptic appearing, in no apparent distress.   Patient does not meet the SIRS or Sepsis criteria.  On repeat exam patient does not have a surgical abdomin and there are no peritoneal signs.  No indication of appendicitis, bowel obstruction, bowel perforation, cholecystitis, diverticulitis, PID or ectopic pregnancy.  Patient discharged home with symptomatic treatment and given strict instructions for follow-up with their primary care physician.  I have also discussed reasons to return immediately to the ER.  Patient expresses understanding and agrees with plan.      Final Clinical Impressions(s) / ED Diagnoses   Final diagnoses:  Flank pain  Muscle  pain    ED Discharge Orders         Ordered    naproxen (NAPROSYN) 500 MG tablet  2 times daily     01/11/19 1255    lidocaine  (LIDODERM) 5 %  Every 24 hours     01/11/19 1255    ondansetron (ZOFRAN ODT) 4 MG disintegrating tablet  Every 8 hours PRN     01/11/19 1255           Alanmichael Barmore A, PA-C 01/11/19 1259    Pattricia Boss, MD 01/11/19 1506

## 2019-01-11 NOTE — ED Notes (Signed)
Pt ambulatory to bathroom for urine sample.

## 2019-01-12 LAB — URINE CULTURE: Culture: <10000 — AB

## 2019-01-23 ENCOUNTER — Other Ambulatory Visit (HOSPITAL_COMMUNITY): Payer: Self-pay | Admitting: Psychiatry

## 2019-01-23 ENCOUNTER — Telehealth (HOSPITAL_COMMUNITY): Payer: Self-pay

## 2019-01-23 MED ORDER — AMPHETAMINE-DEXTROAMPHETAMINE 20 MG PO TABS: 20.0000 mg | ORAL_TABLET | Freq: Two times a day (BID) | ORAL | 0 refills | Status: DC

## 2019-01-23 NOTE — Telephone Encounter (Signed)
Patient has an Adderall prescription at [facility], however her insurance prefers [facility]. Patient would like it resent to [facility] on [address]. I can call [facility] and discontinue the rx that is there

## 2019-01-23 NOTE — Telephone Encounter (Signed)
I did send Rx to [facility].

## 2019-03-27 ENCOUNTER — Other Ambulatory Visit (HOSPITAL_COMMUNITY): Payer: Self-pay | Admitting: Psychiatry

## 2019-03-27 ENCOUNTER — Other Ambulatory Visit: Payer: Self-pay

## 2019-03-27 ENCOUNTER — Ambulatory Visit (HOSPITAL_COMMUNITY): Payer: Medicaid Other | Admitting: Psychiatry

## 2019-03-27 MED ORDER — BUPROPION HCL ER (XL) 300 MG PO TB24: 300.0000 mg | ORAL_TABLET | Freq: Every day | ORAL | 0 refills | Status: DC

## 2019-03-27 MED ORDER — TRAZODONE HCL 100 MG PO TABS: 100.0000 mg | ORAL_TABLET | Freq: Every evening | ORAL | 0 refills | Status: DC | PRN

## 2019-11-25 ENCOUNTER — Other Ambulatory Visit: Payer: Self-pay

## 2019-11-25 ENCOUNTER — Encounter (HOSPITAL_COMMUNITY): Payer: Self-pay | Admitting: Emergency Medicine

## 2019-11-25 ENCOUNTER — Emergency Department (HOSPITAL_COMMUNITY)
Admission: EM | Admit: 2019-11-25 | Discharge: 2019-11-26 | Disposition: A | Discharge status: Home / Self Care | Payer: Medicaid Other | Attending: Emergency Medicine | Admitting: Emergency Medicine

## 2019-11-25 DIAGNOSIS — E039 Hypothyroidism, unspecified: Secondary | ICD-10-CM | POA: Diagnosis not present

## 2019-11-25 DIAGNOSIS — F450 Somatization disorder: Secondary | ICD-10-CM | POA: Insufficient documentation

## 2019-11-25 DIAGNOSIS — E871 Hypo-osmolality and hyponatremia: Secondary | ICD-10-CM | POA: Insufficient documentation

## 2019-11-25 DIAGNOSIS — Z79899 Other long term (current) drug therapy: Secondary | ICD-10-CM | POA: Insufficient documentation

## 2019-11-25 DIAGNOSIS — R1110 Vomiting, unspecified: Secondary | ICD-10-CM | POA: Diagnosis not present

## 2019-11-25 DIAGNOSIS — J029 Acute pharyngitis, unspecified: Secondary | ICD-10-CM | POA: Insufficient documentation

## 2019-11-25 DIAGNOSIS — F1210 Cannabis abuse, uncomplicated: Secondary | ICD-10-CM | POA: Diagnosis not present

## 2019-11-25 DIAGNOSIS — F17210 Nicotine dependence, cigarettes, uncomplicated: Secondary | ICD-10-CM | POA: Insufficient documentation

## 2019-11-25 DIAGNOSIS — Z7289 Other problems related to lifestyle: Secondary | ICD-10-CM | POA: Diagnosis not present

## 2019-11-25 DIAGNOSIS — F319 Bipolar disorder, unspecified: Secondary | ICD-10-CM | POA: Insufficient documentation

## 2019-11-25 DIAGNOSIS — G4700 Insomnia, unspecified: Secondary | ICD-10-CM | POA: Diagnosis not present

## 2019-11-25 DIAGNOSIS — Z590 Homelessness: Secondary | ICD-10-CM | POA: Insufficient documentation

## 2019-11-25 DIAGNOSIS — L539 Erythematous condition, unspecified: Secondary | ICD-10-CM | POA: Diagnosis not present

## 2019-11-25 DIAGNOSIS — F411 Generalized anxiety disorder: Secondary | ICD-10-CM | POA: Diagnosis not present

## 2019-11-25 DIAGNOSIS — Z20822 Contact with and (suspected) exposure to covid-19: Secondary | ICD-10-CM | POA: Insufficient documentation

## 2019-11-25 DIAGNOSIS — F430 Acute stress reaction: Secondary | ICD-10-CM | POA: Diagnosis not present

## 2019-11-25 DIAGNOSIS — Z915 Personal history of self-harm: Secondary | ICD-10-CM | POA: Insufficient documentation

## 2019-11-25 DIAGNOSIS — F329 Major depressive disorder, single episode, unspecified: Secondary | ICD-10-CM | POA: Diagnosis present

## 2019-11-25 DIAGNOSIS — Z79890 Hormone replacement therapy (postmenopausal): Secondary | ICD-10-CM | POA: Diagnosis not present

## 2019-11-25 DIAGNOSIS — F439 Reaction to severe stress, unspecified: Secondary | ICD-10-CM

## 2019-11-25 LAB — COMPREHENSIVE METABOLIC PANEL
ALT: 17 U/L (ref 0–44)
AST: 25 U/L (ref 15–41)
Albumin: 4 g/dL (ref 3.5–5.0)
Alkaline Phosphatase: 51 U/L (ref 38–126)
Anion gap: 12 (ref 5–15)
BUN: 5 mg/dL — ABNORMAL LOW (ref 6–20)
CO2: 24 mmol/L (ref 22–32)
Calcium: 9 mg/dL (ref 8.9–10.3)
Chloride: 86 mmol/L — ABNORMAL LOW (ref 98–111)
Creatinine, Ser: 0.53 mg/dL (ref 0.44–1.00)
GFR calc Af Amer: >60 mL/min (ref >60)
GFR calc non Af Amer: >60 mL/min (ref >60)
Glucose, Bld: 82 mg/dL (ref 70–99)
Potassium: 3.3 mmol/L — ABNORMAL LOW (ref 3.5–5.1)
Sodium: 122 mmol/L — ABNORMAL LOW (ref 135–145)
Total Bilirubin: 0.4 mg/dL (ref 0.3–1.2)
Total Protein: 6.5 g/dL (ref 6.5–8.1)

## 2019-11-25 LAB — CBC WITH DIFFERENTIAL/PLATELET
Abs Immature Granulocytes: 0.01 10*3/uL (ref 0.00–0.07)
Basophils Absolute: 0 10*3/uL (ref 0.0–0.1)
Basophils Relative: 1 %
Eosinophils Absolute: 0 10*3/uL (ref 0.0–0.5)
Eosinophils Relative: 1 %
HCT: 35.4 % — ABNORMAL LOW (ref 36.0–46.0)
Hemoglobin: 13.2 g/dL (ref 12.0–15.0)
Immature Granulocytes: 0 %
Lymphocytes Relative: 35 %
Lymphs Abs: 2 10*3/uL (ref 0.7–4.0)
MCH: 35 pg — ABNORMAL HIGH (ref 26.0–34.0)
MCHC: 37.3 g/dL — ABNORMAL HIGH (ref 30.0–36.0)
MCV: 93.9 fL (ref 80.0–100.0)
Monocytes Absolute: 0.6 10*3/uL (ref 0.1–1.0)
Monocytes Relative: 10 %
Neutro Abs: 3.1 10*3/uL (ref 1.7–7.7)
Neutrophils Relative %: 53 %
Platelets: 280 10*3/uL (ref 150–400)
RBC: 3.77 MIL/uL — ABNORMAL LOW (ref 3.87–5.11)
RDW: 11.5 % (ref 11.5–15.5)
WBC: 5.8 10*3/uL (ref 4.0–10.5)
nRBC: 0 % (ref 0.0–0.2)

## 2019-11-25 LAB — I-STAT BETA HCG BLOOD, ED (MC, WL, AP ONLY): I-stat hCG, quantitative: <5 m[IU]/mL (ref <5)

## 2019-11-25 LAB — RAPID URINE DRUG SCREEN, HOSP PERFORMED
Amphetamines: NOT DETECTED
Barbiturates: NOT DETECTED
Benzodiazepines: NOT DETECTED
Cocaine: NOT DETECTED
Opiates: NOT DETECTED
Tetrahydrocannabinol: NOT DETECTED

## 2019-11-25 LAB — ETHANOL: Alcohol, Ethyl (B): 77 mg/dL — ABNORMAL HIGH (ref <10)

## 2019-11-25 LAB — GROUP A STREP BY PCR: Group A Strep by PCR: NOT DETECTED

## 2019-11-25 MED ORDER — LORAZEPAM 1 MG PO TABS: 0.0000 mg | ORAL_TABLET | Freq: Four times a day (QID) | ORAL | Status: DC

## 2019-11-25 MED ORDER — LORAZEPAM 2 MG/ML IJ SOLN: 0.0000 mg | Freq: Four times a day (QID) | INTRAMUSCULAR | Status: DC

## 2019-11-25 MED ORDER — THIAMINE HCL 100 MG/ML IJ SOLN: 100.0000 mg | Freq: Every day | INTRAMUSCULAR | Status: DC

## 2019-11-25 MED ORDER — LORAZEPAM 1 MG PO TABS: 0.0000 mg | ORAL_TABLET | Freq: Two times a day (BID) | ORAL | Status: DC

## 2019-11-25 MED ORDER — POTASSIUM CHLORIDE CRYS ER 20 MEQ PO TBCR
40.0000 meq | EXTENDED_RELEASE_TABLET | Freq: Once | ORAL | Status: AC
  Administered 2019-11-25: 40 meq via ORAL
  Filled 2019-11-25: qty 2

## 2019-11-25 MED ORDER — LORAZEPAM 2 MG/ML IJ SOLN: 0.0000 mg | Freq: Two times a day (BID) | INTRAMUSCULAR | Status: DC

## 2019-11-25 MED ORDER — THIAMINE HCL 100 MG PO TABS
100.0000 mg | ORAL_TABLET | Freq: Every day | ORAL | Status: DC
  Administered 2019-11-26: 100 mg via ORAL
  Filled 2019-11-25: qty 1

## 2019-11-25 MED ORDER — SODIUM CHLORIDE 0.9 % IV BOLUS
1000.0000 mL | Freq: Once | INTRAVENOUS | Status: AC
  Administered 2019-11-25: 1000 mL via INTRAVENOUS

## 2019-11-25 NOTE — ED Provider Notes (Signed)
[facility]-EMERGENCY DEPT Provider Note   CSN: [id number] Arrival date & time: 11/25/19  1643     History Chief Complaint  Patient presents with  . Psychiatric Evaluation    Tina [name] is a 49 y.o. female with a history of bipolar disorder, depression, adrenal insufficiency, hypothyroidism, & alcohol abuse who presents to the ED requesting psychiatric evaluation. She states she has been having a very hard time recently, she lost her job, lost her home, and has been feeling very depressed and anxious. She was living with her mother at one time. Currently is homeless. She has had prior suicide attempts in the past, not recently, no SI currently. Denies HI or hallucinations. She states she does drink alcohol, frequency is variable, not daily, denies alcohol withdrawal. She has had poor PO intake over the past several months. She is not currently taking any prescription medications and has not been for several months.   She is requesting to be tested for strep throat due to some throat irritation over the past few days. Reports 1 episode of emesis yesterday when she was very stressed. Has had headaches & fatigue but these are each chronic issues which she attributes to her chronic lymes disease. She denies fever, syncope, numbness, focal weakness, abdomina pain, diarrhea, cough, dyspnea, or chest pain.   HPI     Past Medical History:  Diagnosis Date  . Acne   . Adrenal insufficiency (HCC)   . Bipolar 2 disorder (HCC)   . Cellulitis   . Depression   . Genital warts   . Headache   . History of chicken pox   . Hx of Lyme disease   . Hypothyroidism     Patient Active Problem List   Diagnosis Date Noted  . Bipolar 1 disorder, mixed, full remission (HCC) 07/05/2018  . Adult ADHD 05/23/2018  . Suicide attempt (HCC)   . Overdose 04/03/2018  . Tobacco use disorder 04/03/2018  . ASCUS with positive high risk HPV cervical 07/31/2017  . Rash and nonspecific  skin eruption 07/14/2016  . Tegretol toxicity 07/09/2016  . Electrolyte imbalance 07/09/2016  . Migraine 02/20/2015  . Disease of thyroid gland 06/27/2014  . Opiate misuse 06/27/2014  . Cannabis use disorder, moderate, in sustained remission (HCC) 06/27/2014  . 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 Alcohol use disorder, severe, in sustained remission (HCC) 06/24/2014  . Adrenal insufficiency (HCC)   . Acne   . Hx of Lyme disease   . 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 Hypothyroidism 12/23/2010  . Myalgia 12/23/2010  . Fatigue 12/23/2010  . Unspecified vitamin deficiency 12/23/2010    Past Surgical History:  Procedure Laterality Date  . CESAREAN SECTION  2003, 2007, 2011  . INGUINAL HERNIA REPAIR Bilateral   . TONSILLECTOMY    . UMBILICAL HERNIA REPAIR       OB History   No obstetric history on file.     Family History  Problem Relation Age of Onset  . Other Mother        PAD  . Heart disease Other        Grandparent  . Alcohol abuse Paternal Grandmother   . Mental retardation Paternal Grandmother   . Arthritis Maternal Grandfather   . Heart disease Maternal Grandfather   . Anxiety disorder Sister   . Depression Sister     Social History   Tobacco Use  . Smoking status: Current Every Day Smoker    Packs/day: 1.00    Years: 29.00  Pack years: 29.00    Types: Cigarettes  . Smokeless tobacco: Never Used  Vaping Use  . Vaping Use: Never used  Substance Use Topics  . Alcohol use: Yes    Alcohol/week: 3.0 standard drinks    Types: 3 Shots of liquor per week    Comment: social drinker-few drinks a week   . Drug use: No    Home Medications Prior to Admission medications   Medication Sig Start Date End Date Taking? Authorizing Provider  amphetamine-dextroamphetamine (ADDERALL) 20 MG tablet Take 1 tablet (20 mg total) by mouth 2 (two) times daily. 01/23/19 02/22/19  Pucilowski, Roosvelt Maser, MD  ARMOUR THYROID 120 MG tablet Take 120 mg by mouth daily. 02/22/18    [provider]  Aspirin-Salicylamide-Caffeine (BC HEADACHE POWDER PO) Take 1 packet by mouth as needed (pain/headache).    [provider]  buPROPion (WELLBUTRIN XL) 300 MG 24 hr tablet Take 1 tablet (300 mg total) by mouth daily. 03/27/19 06/25/19  Pucilowski, Roosvelt Maser, MD  cholecalciferol (VITAMIN D3) 25 MCG (1000 UT) tablet Take 1,000 Units by mouth daily.    [provider]  fluticasone (FLONASE) 50 MCG/ACT nasal spray Place 1-2 sprays into both nostrils daily.    [provider]  ibuprofen (ADVIL) 200 MG tablet Take 800 mg by mouth every 6 (six) hours as needed for fever, headache or moderate pain.    [provider]  lidocaine (LIDODERM) 5 % Place 1 patch onto the skin daily. Remove & Discard patch within 12 hours or as directed by MD 01/11/19   Henderly, Britni A, Tina [name]  liothyronine (CYTOMEL) 5 MCG tablet Take 10 mcg by mouth every morning. 11/28/18   [provider]  naproxen (NAPROSYN) 500 MG tablet Take 1 tablet (500 mg total) by mouth 2 (two) times daily. 01/11/19   Henderly, Britni A, Tina [name]  ondansetron (ZOFRAN ODT) 4 MG disintegrating tablet Take 1 tablet (4 mg total) by mouth every 8 (eight) hours as needed for nausea or vomiting. 01/11/19   Henderly, Britni A, Tina [name]  progesterone (PROMETRIUM) 100 MG capsule Take 100 mg by mouth at bedtime. 11/27/18   [provider]  traZODone (DESYREL) 100 MG tablet Take 1 tablet (100 mg total) by mouth at bedtime as needed for sleep. 03/27/19 06/25/19  Pucilowski, Roosvelt Maser, MD    Allergies    Stadol [butorphanol] and Vancomycin  Review of Systems   Review of Systems  Constitutional: Positive for fatigue (chronic). Negative for chills and fever.  HENT: Positive for sore throat.   Respiratory: Negative for cough and shortness of breath.   Gastrointestinal: Positive for vomiting (x1). Negative for abdominal pain, blood in stool, constipation and diarrhea.  Genitourinary: Negative for  dysuria.  Neurological: Negative for syncope, weakness and numbness.  Psychiatric/Behavioral: Positive for dysphoric mood. Negative for hallucinations and suicidal ideas. The patient is nervous/anxious.   All other systems reviewed and are negative.   Physical Exam Updated Vital Signs BP (!) 156/92 (BP Location: Left Arm)   Pulse 66   Temp 98.2 F (36.8 C) (Oral)   Resp 17   Ht 5' (1.524 m)   Wt 49.9 kg   SpO2 100%   BMI 21.48 kg/m   Physical Exam Vitals and nursing note reviewed.  Constitutional:      General: She is not in acute distress.    Appearance: She is well-developed.  HENT:     Head: Normocephalic and atraumatic.     Right Ear: Ear canal normal. Tympanic  membrane is not perforated, erythematous, retracted or bulging.     Left Ear: Ear canal normal. Tympanic membrane is not perforated, erythematous, retracted or bulging.     Ears:     Comments: No mastoid erythema/swelling/tenderness.     Nose:     Right Sinus: No maxillary sinus tenderness or frontal sinus tenderness.     Left Sinus: No maxillary sinus tenderness or frontal sinus tenderness.     Mouth/Throat:     Pharynx: Uvula midline. Posterior oropharyngeal erythema present. No oropharyngeal exudate.     Comments: Posterior oropharynx is symmetric appearing. Patient tolerating own secretions without difficulty. No trismus. No drooling. No hot potato voice. No swelling beneath the tongue, submandibular compartment is soft.  Eyes:     General:        Right eye: No discharge.        Left eye: No discharge.     Conjunctiva/sclera: Conjunctivae normal.     Pupils: Pupils are equal, round, and reactive to light.  Cardiovascular:     Rate and Rhythm: Normal rate and regular rhythm.     Heart sounds: No murmur heard.   Pulmonary:     Effort: Pulmonary effort is normal. No respiratory distress.     Breath sounds: Normal breath sounds. No wheezing, rhonchi or rales.  Abdominal:     General: There is no  distension.     Palpations: Abdomen is soft.     Tenderness: There is no abdominal tenderness.  Musculoskeletal:     Cervical back: Normal range of motion and neck supple. No edema or rigidity.  Lymphadenopathy:     Cervical: No cervical adenopathy.  Skin:    General: Skin is warm and dry.     Findings: No rash.  Neurological:     Mental Status: She is alert.  Psychiatric:        Attention and Perception: She does not perceive auditory or visual hallucinations.        Mood and Affect: Affect is flat.        Thought Content: Thought content does not include homicidal or suicidal ideation.    ED Results / Procedures / Treatments   Labs (all labs ordered are listed, but only abnormal results are displayed) Labs Reviewed  COMPREHENSIVE METABOLIC PANEL - Abnormal; Notable for the following components:      Result Value   Sodium 122 (*)    Potassium 3.3 (*)    Chloride 86 (*)    BUN 5 (*)    All other components within normal limits  ETHANOL - Abnormal; Notable for the following components:   Alcohol, Ethyl (B) 77 (*)    All other components within normal limits  CBC WITH DIFFERENTIAL/PLATELET - Abnormal; Notable for the following components:   RBC 3.77 (*)    HCT 35.4 (*)    MCH 35.0 (*)    MCHC 37.3 (*)    All other components within normal limits  RAPID URINE DRUG SCREEN, HOSP PERFORMED  TSH  I-STAT BETA HCG BLOOD, ED ([facility], [facility], [facility] ONLY)    EKG None  Radiology No results found.  Procedures Procedures (including critical care time)  Medications Ordered in ED Medications  LORazepam (ATIVAN) injection 0-4 mg (has no administration in time range)    Or  LORazepam (ATIVAN) tablet 0-4 mg (has no administration in time range)  LORazepam (ATIVAN) injection 0-4 mg (has no administration in time range)    Or  LORazepam (ATIVAN) tablet 0-4 mg (has  no administration in time range)  thiamine tablet 100 mg (has no administration in time range)    Or  thiamine (B-1)  injection 100 mg (has no administration in time range)  sodium chloride 0.9 % bolus 1,000 mL (1,000 mLs Intravenous New Bag/Given 11/25/19 2305)  potassium chloride SA (KLOR-CON) CR tablet 40 mEq (40 mEq Oral Given 11/25/19 2304)    ED Course  I have reviewed the triage vital signs and the nursing notes.  Pertinent labs & imaging results that were available during my care of the patient were reviewed by me and considered in my medical decision making (see chart for details).    MDM Rules/Calculators/A&P                         Patient presents to the ED with complaints of increased stress & depression.  She is nontoxic, BP mildly elevated, low suspicion for HTN emergency.   Additional history obtained:  Additional history obtained from chart review & nursing note review.  Lab Tests:  I reviewed and interpreted labs, which included:  CBC: No significant anemia/leukocytosis.  CMP: Hyponatremia @ 122 and hypochloremia @ 86- new, most recent labs are from 10 months prior. Mild hypokalemia.  Ethanol level mildly elevated UDS: Negative Preg test: Negative.   In regards to hyponatremia- not taking any medications that would cause this, may be related to her poor PO intake, possible EtOH component, she does have some fatigue and headaches but these seem to be chronic issues as opposed to acute symptoms from hyponatremia. Will give 1L NS & recheck BMP following per discussion w/ Dr. Judd Lienelo. Oral potassium ordered for hypokalemia. Strep ordered @ patient's request, exam is not consistent w/ RPA/PTA. Ethanol level mildly elevated- place on CIWA protocol. Consult placed to TTS for psych eval.   Patient care signed out to Mia McDonald Tina [name] at change of shift pending re-assessment, repeat BMP, & disposition. If improving sodium anticipate medical clearance.   Findings and plan of care discussed with supervising physician Dr. Judd Lienelo who is in agreement.   Portions of this note were generated with Administrator, [employer]  dictation software. Dictation errors may occur despite best attempts at proofreading.  Final Clinical Impression(s) / ED Diagnoses Final diagnoses:  Stress  Hyponatremia    Rx / DC Orders ED Discharge Orders    None       Cherly Andersonetrucelli, Tina Saltsman [name], Tina [name] 11/25/19 2327    Geoffery Lyonselo, [place], MD 11/26/19 [phone]

## 2019-11-25 NOTE — ED Provider Notes (Signed)
49 year old female received at signout from [facility] pending repeat metabolic panel and TTS consult.  Per her HPI:  "Tina [name] is a 49 y.o. female with a history of bipolar disorder, depression, adrenal insufficiency, hypothyroidism, & alcohol abuse who presents to the ED requesting psychiatric evaluation. She states she has been having a very hard time recently, she lost her job, lost her home, and has been feeling very depressed and anxious. She was living with her mother at one time. Currently is homeless. She has had prior suicide attempts in the past, not recently, no SI currently. Denies HI or hallucinations. She states she does drink alcohol, frequency is variable, not daily, denies alcohol withdrawal. She has had poor PO intake over the past several months. She is not currently taking any prescription medications and has not been for several months.   She is requesting to be tested for strep throat due to some throat irritation over the past few days. Reports 1 episode of emesis yesterday when she was very stressed. Has had headaches & fatigue but these are each chronic issues which she attributes to her chronic lymes disease. She denies fever, syncope, numbness, focal weakness, abdomina pain, diarrhea, cough, dyspnea, or chest pain."   Physical Exam  BP (!) 156/92   Pulse 66   Temp 98.2 F (36.8 C) (Oral)   Resp 17   Ht 5' (1.524 m)   Wt 49.9 kg   SpO2 100%   BMI 21.48 kg/m   Physical Exam Vitals and nursing note reviewed.  Constitutional:      Appearance: She is well-developed.     Comments: Sleeping, but rouses easily to voice.   HENT:     Head: Normocephalic and atraumatic.  Cardiovascular:     Rate and Rhythm: Normal rate.  Pulmonary:     Effort: Pulmonary effort is normal.  Abdominal:     General: There is no distension.  Musculoskeletal:        General: Normal range of motion.     Cervical back: Normal range of motion.  Skin:    Coloration: Skin is  not jaundiced.     ED Course/Procedures     Procedures  MDM   49 year old female received at signout from [facility] pending repeat metabolic panel.  Please see her note for further work-up and medical decision making.  In brief, this is a 49 year old female with a history of adrenal insufficiency, a longstanding history of alcohol use disorder, and bipolar use disorder presenting with worsening anxiety and depression.  She was found to be hyponatremic to 122, new from October 2020.  There is concern this may be secondary to arterial insufficiency versus secondary to heavy alcohol use.  She was given a liter of IV fluids and on repeat metabolic panel sodium significantly improved to 131.  CIWA score was 0.  No evidence of withdrawal.  No other metabolic derangements.  She is not on any corticosteroids at home.  Her medications none ordered as she has been discontinued all of them.  Pt medically cleared at this time. Psych hold orders placed. TTS consult pending; please see psych team notes for further documentation of care/dispo. Pt stable at time of med clearance.     Frederik Pear A, PA-C 11/26/19 0501    Shon Baton, MD 11/26/19 [phone]

## 2019-11-25 NOTE — ED Triage Notes (Signed)
Per patient, states she has been under a lot of stress-lost her house, has 3 kids-thinks she needs her meds adjusted-wants to be admitted to [facility]

## 2019-11-26 ENCOUNTER — Ambulatory Visit (HOSPITAL_COMMUNITY)
Admission: EM | Admit: 2019-11-26 | Discharge: 2019-11-27 | Disposition: A | Discharge status: Home / Self Care | Payer: Medicaid Other | Source: Home / Self Care

## 2019-11-26 DIAGNOSIS — Z915 Personal history of self-harm: Secondary | ICD-10-CM | POA: Insufficient documentation

## 2019-11-26 DIAGNOSIS — F411 Generalized anxiety disorder: Secondary | ICD-10-CM | POA: Insufficient documentation

## 2019-11-26 DIAGNOSIS — F17210 Nicotine dependence, cigarettes, uncomplicated: Secondary | ICD-10-CM | POA: Insufficient documentation

## 2019-11-26 DIAGNOSIS — Z7289 Other problems related to lifestyle: Secondary | ICD-10-CM | POA: Insufficient documentation

## 2019-11-26 DIAGNOSIS — Z79899 Other long term (current) drug therapy: Secondary | ICD-10-CM | POA: Insufficient documentation

## 2019-11-26 DIAGNOSIS — R450 Nervousness: Secondary | ICD-10-CM | POA: Insufficient documentation

## 2019-11-26 DIAGNOSIS — F319 Bipolar disorder, unspecified: Secondary | ICD-10-CM | POA: Insufficient documentation

## 2019-11-26 DIAGNOSIS — Z590 Homelessness: Secondary | ICD-10-CM | POA: Insufficient documentation

## 2019-11-26 DIAGNOSIS — G4700 Insomnia, unspecified: Secondary | ICD-10-CM | POA: Insufficient documentation

## 2019-11-26 DIAGNOSIS — Z87898 Personal history of other specified conditions: Secondary | ICD-10-CM

## 2019-11-26 DIAGNOSIS — F909 Attention-deficit hyperactivity disorder, unspecified type: Secondary | ICD-10-CM | POA: Diagnosis present

## 2019-11-26 LAB — BASIC METABOLIC PANEL
Anion gap: 11 (ref 5–15)
BUN: <5 mg/dL — ABNORMAL LOW (ref 6–20)
CO2: 24 mmol/L (ref 22–32)
Calcium: 8.7 mg/dL — ABNORMAL LOW (ref 8.9–10.3)
Chloride: 96 mmol/L — ABNORMAL LOW (ref 98–111)
Creatinine, Ser: 0.45 mg/dL (ref 0.44–1.00)
GFR calc Af Amer: >60 mL/min (ref >60)
GFR calc non Af Amer: >60 mL/min (ref >60)
Glucose, Bld: 82 mg/dL (ref 70–99)
Potassium: 3.6 mmol/L (ref 3.5–5.1)
Sodium: 131 mmol/L — ABNORMAL LOW (ref 135–145)

## 2019-11-26 LAB — SARS CORONAVIRUS 2 BY RT PCR (HOSPITAL ORDER, PERFORMED IN ~~LOC~~ HOSPITAL LAB): SARS Coronavirus 2: NEGATIVE

## 2019-11-26 MED ORDER — ALUM & MAG HYDROXIDE-SIMETH 200-200-20 MG/5ML PO SUSP: 30.0000 mL | ORAL | Status: DC | PRN

## 2019-11-26 MED ORDER — TRAZODONE HCL 50 MG PO TABS
50.0000 mg | ORAL_TABLET | Freq: Every evening | ORAL | Status: DC | PRN
  Administered 2019-11-26: 50 mg via ORAL
  Filled 2019-11-26: qty 1

## 2019-11-26 MED ORDER — ACETAMINOPHEN 325 MG PO TABS
650.0000 mg | ORAL_TABLET | Freq: Four times a day (QID) | ORAL | Status: DC | PRN
  Administered 2019-11-26: 650 mg via ORAL
  Filled 2019-11-26: qty 2

## 2019-11-26 MED ORDER — BUPROPION HCL ER (XL) 150 MG PO TB24
150.0000 mg | ORAL_TABLET | Freq: Every day | ORAL | Status: DC
  Administered 2019-11-26 – 2019-11-27 (×2): 150 mg via ORAL
  Filled 2019-11-26 (×2): qty 1

## 2019-11-26 MED ORDER — HYDROXYZINE HCL 25 MG PO TABS
25.0000 mg | ORAL_TABLET | Freq: Three times a day (TID) | ORAL | Status: DC | PRN
  Administered 2019-11-26 – 2019-11-27 (×3): 25 mg via ORAL
  Filled 2019-11-26 (×3): qty 1

## 2019-11-26 NOTE — [facility] Counselor (Signed)
[facility] nurse notified that pt was recommended transfer to [facility] by Dr. Lucianne Muss. Counselor notified Maisie Fus of pt recommendations. Denice Bors, NP and Laurier Nancy, RN notified of pt transfer to [facility].

## 2019-11-26 NOTE — BH Assessment (Signed)
[facility] Assessment Progress Note  Per Nelly Rout, MD, pt is to be is transferred to the [facility].  Rex Kras, RN agrees to accept pt.  Please call report to [phone].  Pt is to be transported via [employer].  EDP Meridee Score, MD and pt's nurse, Waynetta Sandy, have been notified.  Doylene Canning, [place] [facility] Coordinator [phone]

## 2019-11-26 NOTE — ED Notes (Signed)
Patient offered meal; requested fruit cup, given two fruit cups.
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
## 2019-11-26 NOTE — Consult Note (Signed)
  Patient has been accepted to [facility] [place].  Accepting provider Dr. Nelly Rout.  Dr. Charm Barges has been informed of patients' acceptance.  [facility] has also been informed that patient from [facility] ED accepted; waiting on nursing report.

## 2019-11-26 NOTE — ED Provider Notes (Signed)
Emergency Medicine Observation Re-evaluation Note  Tina [name] is a 49 y.o. female, seen on rounds today.  Pt initially presented to the ED for complaints of Psychiatric Evaluation Currently, the patient is cooperative.  Physical Exam  BP (!) 156/92   Pulse 66   Temp 98.2 F (36.8 C) (Oral)   Resp 17   Ht 5' (1.524 m)   Wt 49.9 kg   SpO2 100%   BMI 21.48 kg/m  Physical Exam General: alert Cardiac: rrr Lungs: cta Psych: calm  ED Course / MDM  EKG:    I have reviewed the labs performed to date as well as medications administered while in observation.  Recent changes in the last 24 hours include TTS eval.  Plan  Current plan is for trnasfer to [facility] accepted by Dr Lucianne Muss. Patient is not under full IVC at this time.   Terrilee Files, MD 11/26/19 [phone]

## 2019-11-26 NOTE — ED Notes (Signed)
Pt DC off unit to [facility]. Pt alert, calm, cooperative, no s/s of distress. DC information and belongings given to [employer] for facility.  Report given to [place] at [facility]. Pt ambulatory off unit , escorted by NT. Pt transported by [employer].

## 2019-11-26 NOTE — ED Notes (Signed)
Pt awake and requesting water and crackers. Pt given water and graham crackers with peanut butter. No other needs at this time.
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
## 2019-11-26 NOTE — ED Notes (Signed)
Patient is alert and oriented. Patient denies SI/HI. Patient states she has been depressed. Patient given support and encouragement. Monitoring continues.

## 2019-11-26 NOTE — ED Notes (Signed)
Patient belongings in locker # 13. 

## 2019-11-26 NOTE — ED Notes (Signed)
Pt lying on stretcher asleep with no c/o of SI thoughts pt c/o of tiredness @ this time.

## 2019-11-27 MED ORDER — HYDROXYZINE HCL 25 MG PO TABS
25.0000 mg | ORAL_TABLET | Freq: Three times a day (TID) | ORAL | 0 refills | Status: AC | PRN
Start: 2019-11-27 — End: ?

## 2019-11-27 MED ORDER — TRAZODONE HCL 50 MG PO TABS
50.0000 mg | ORAL_TABLET | Freq: Every evening | ORAL | 0 refills | Status: AC | PRN
Start: 2019-11-27 — End: ?

## 2019-11-27 MED ORDER — BUPROPION HCL ER (XL) 150 MG PO TB24
150.0000 mg | ORAL_TABLET | Freq: Every day | ORAL | 0 refills | Status: AC
Start: 2019-11-28 — End: ?
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
## 2019-11-27 NOTE — Discharge Instructions (Signed)
Activity as tolerated. Diet as recommended by primary care physician. Keep all scheduled follow-up appointments as recommended.  

## 2019-11-27 NOTE — ED Notes (Signed)
D: Pt alert and oriented on the unit.   A: Education, support, and encouragement provided. Discharge summary, medications and follow up appointments reviewed with pt. Suicide prevention resources provided, including "My 3 App." Pt's belongings in locker returned and belongings sheet signed.   R: Pt denies SI/HI, A/VH, pain, or any concerns at this time. Pt ambulatory on and off unit. Pt discharged to lobby to be taken to [facility] to pick up her car via [employer].Marland Kitchen

## 2020-05-06 IMAGING — CT CT RENAL STONE PROTOCOL
2 of 4 series · 16 of 46 positions shown, 18 images · non-contrast
Comparison: None.

CLINICAL DATA: Left-sided flank pain

EXAM:
CT ABDOMEN AND PELVIS WITHOUT CONTRAST
TECHNIQUE: Multidetector CT imaging of the abdomen and pelvis was performed
following the standard protocol without oral or IV contrast.

[Series 2: axial st · axial · 0.65mm/px · z∈[-395,-35]mm · 13 of 82 slices shown, 15 images]
[im 5/82  soft-tissue]
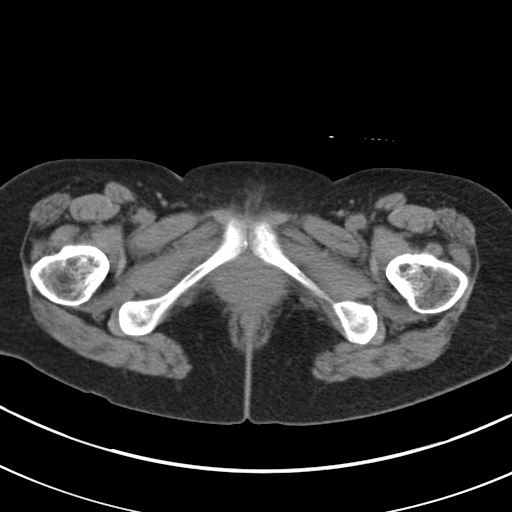
[im 5/82  bone]
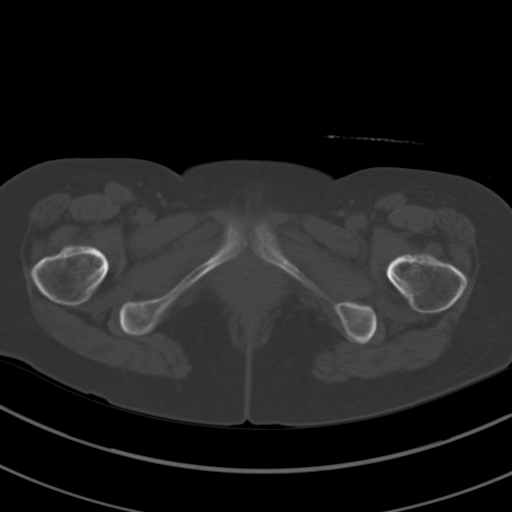
[im 13/82  soft-tissue]
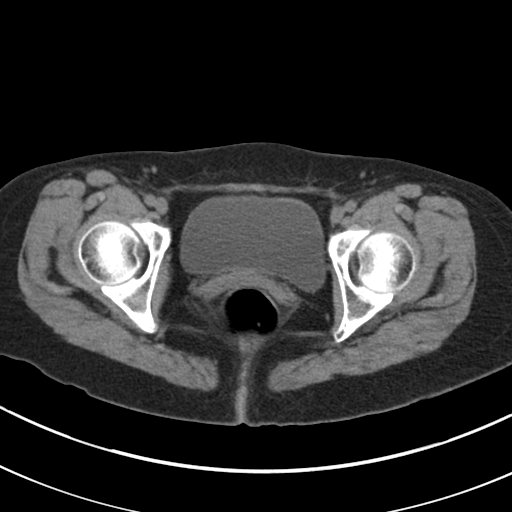
[im 18/82  soft-tissue]
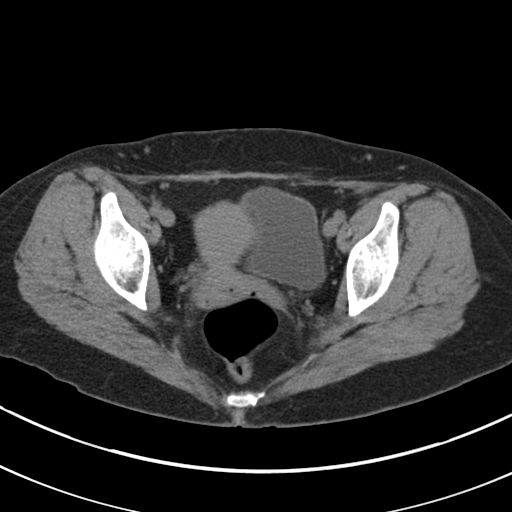
[im 22/82  soft-tissue]
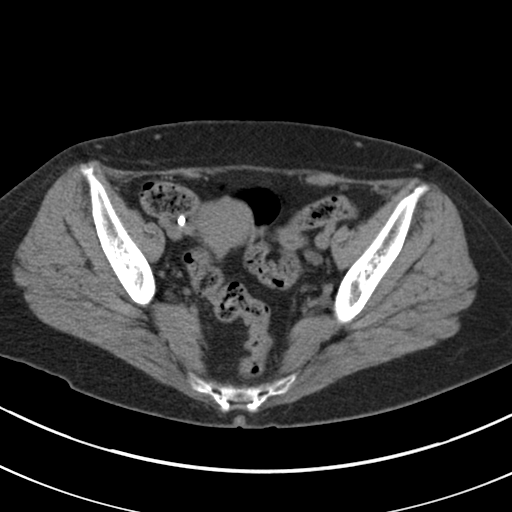
[im 30/82  soft-tissue]
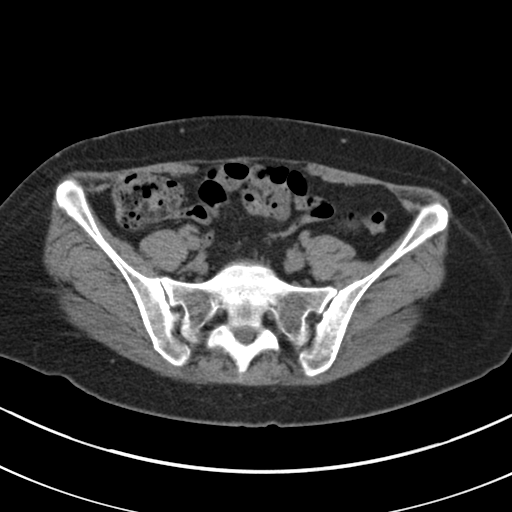
[im 35/82  soft-tissue]
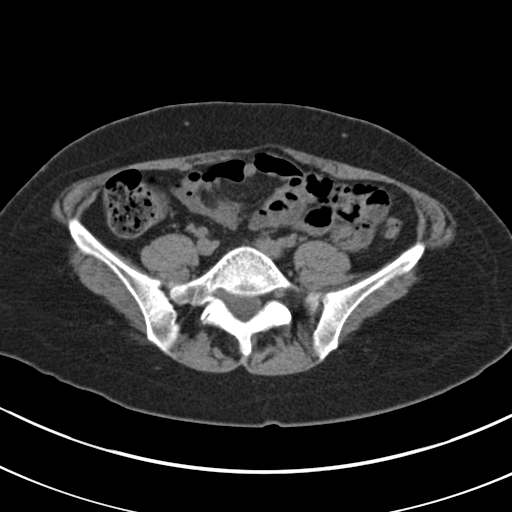
[im 43/82  soft-tissue]
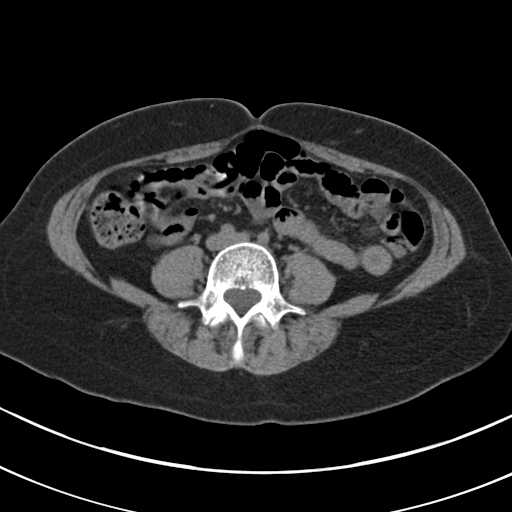
[im 47/82  soft-tissue]
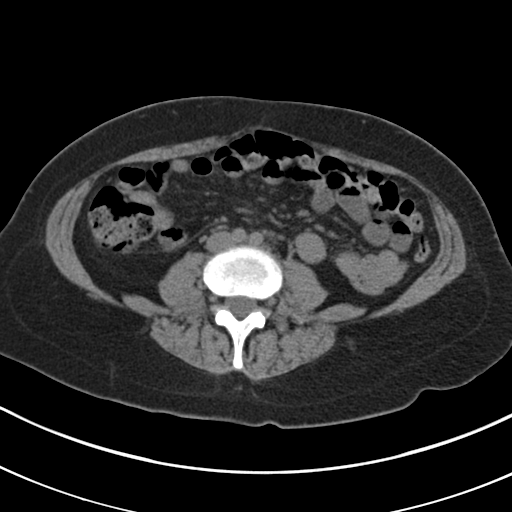
[im 52/82  soft-tissue]
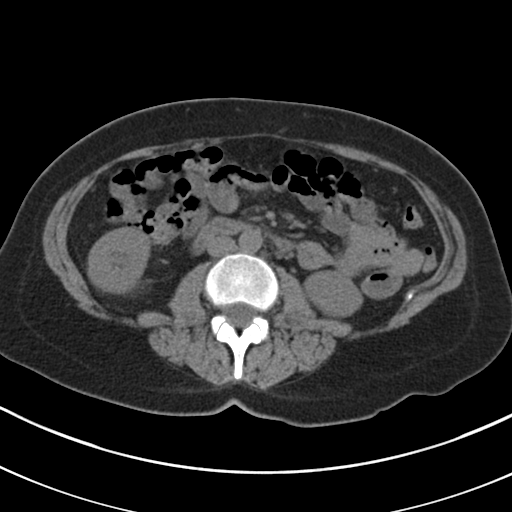
[im 52/82  bone]
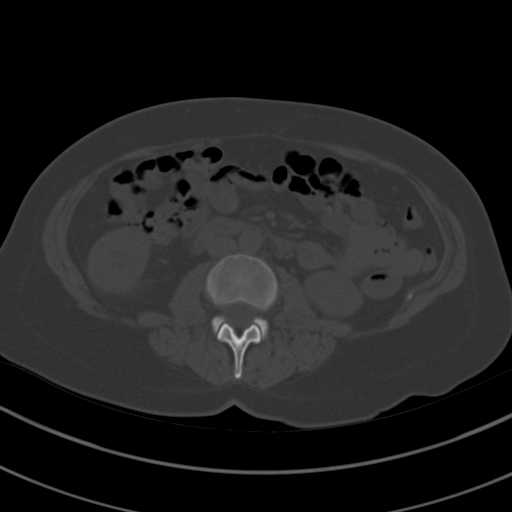
[im 60/82  soft-tissue]
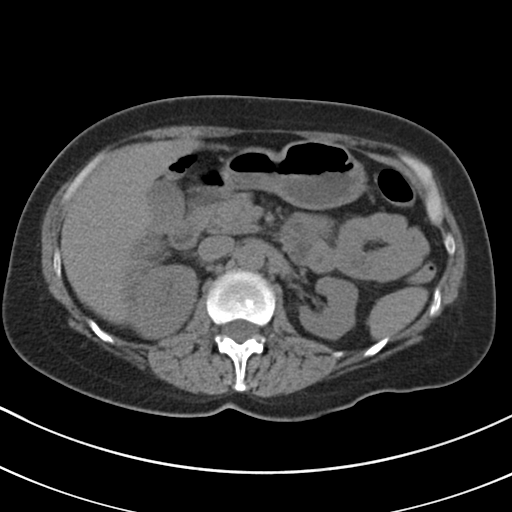
[im 64/82  soft-tissue]
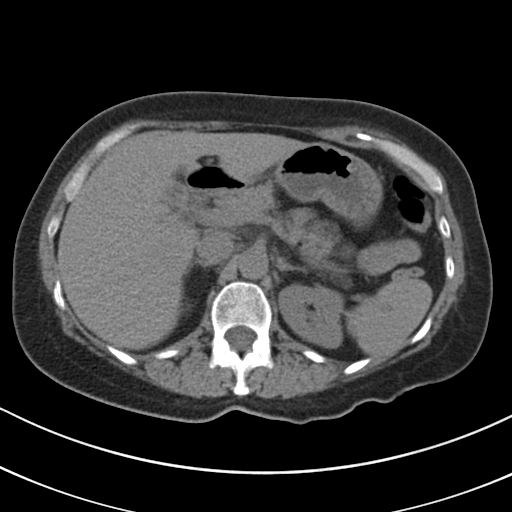
[im 69/82  soft-tissue]
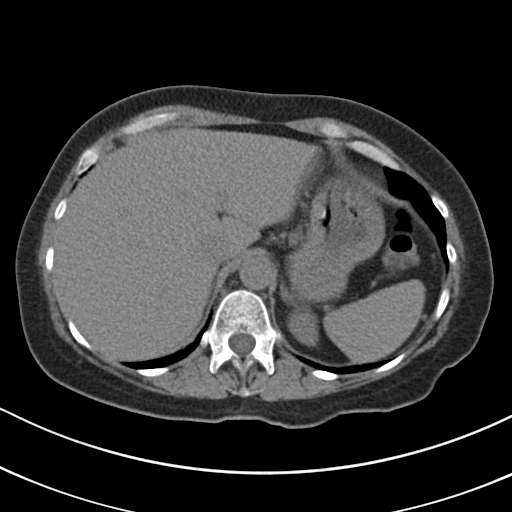
[im 77/82  soft-tissue]
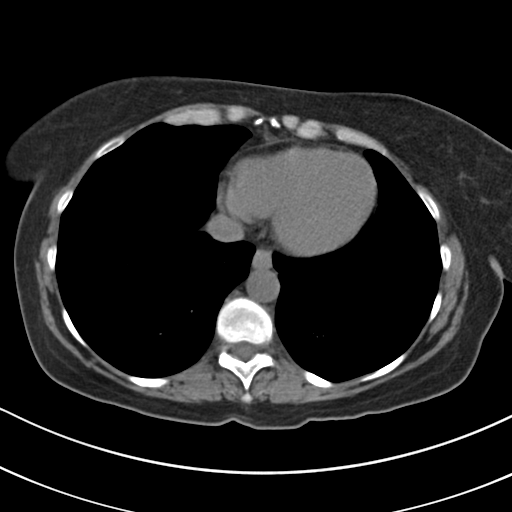

[Series 5: coronal · coronal · 0.69mm/px · 3 of 126 slices shown]
[im 42/126  soft-tissue]
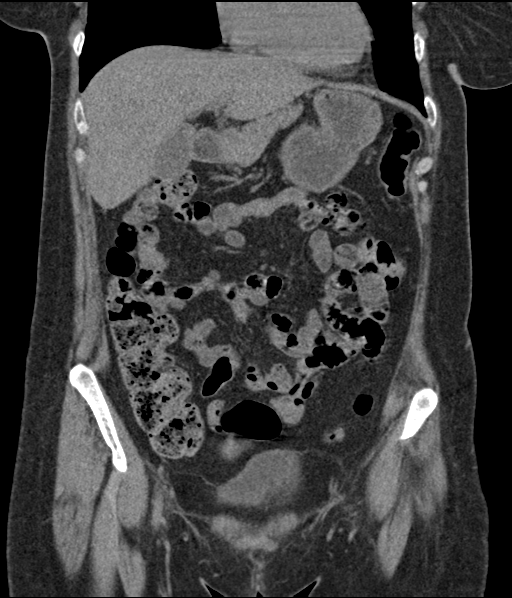
[im 56/126  soft-tissue]
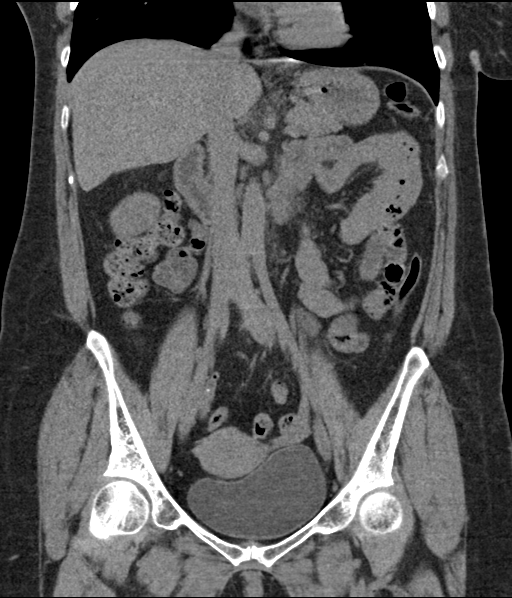
[im 70/126  soft-tissue]
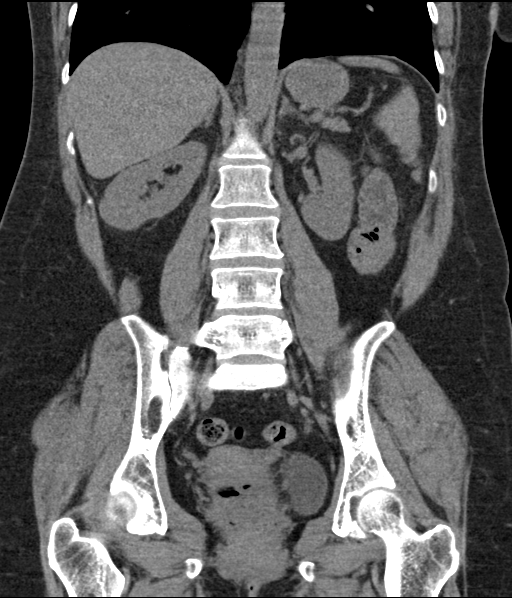

[16 of 46 positions shown; findings below may reference images not displayed]

FINDINGS: Lower chest: There is slight posterior basilar atelectasis. No lung
base edema or consolidation evident. On axial slice 10 series 4,
there is a 2 mm nodular opacity in the inferior lingula abutting the
pleura.

Hepatobiliary: No focal liver lesions are evident on this
noncontrast enhanced study. The gallbladder wall is not appreciably
thickened. There is no biliary duct dilatation.

Pancreas: There is no pancreatic mass or inflammatory focus.

Spleen: No splenic lesions are evident.

Adrenals/Urinary Tract: Adrenals bilaterally appear normal. Kidneys
bilaterally show no evident mass or hydronephrosis on either side.
There is no appreciable renal or ureteral calculus on either side.
Urinary bladder is midline with wall thickness within normal limits.

Stomach/Bowel: There is no appreciable bowel wall or mesenteric
thickening. No evident bowel obstruction. Terminal ileum appears
unremarkable. There is no evident free air or portal venous air.

Vascular/Lymphatic: No abdominal aortic aneurysm. No vascular
lesions are demonstrable on this noncontrast enhanced study. There
is no appreciable adenopathy in the abdomen or pelvis.

Reproductive: Uterus is anteverted. No evident pelvic mass. Tubal
ligation clip noted on the right.

Other: Appendix appears normal. No abscess or ascites evident in the
abdomen or pelvis. There is mild thinning of the rectus muscle in
the midline without frank herniation.

Musculoskeletal: No blastic or lytic bone lesions. No intramuscular
lesions.
IMPRESSION: 1. No renal or ureteral calculus. No hydronephrosis. Urinary bladder
wall thickness normal.

2. No bowel obstruction. No abscess in the abdomen pelvis. Appendix
appears normal.

3.  Tubal ligation clip on the right.

4. 2 mm nodular opacity in the inferior lingula. No follow-up needed
if patient is low-risk. Non-contrast chest CT can be considered in
12 months if patient is high-risk. This recommendation follows the
consensus statement: Guidelines for Management of Incidental
Pulmonary Nodules Detected on CT Images: From the [HOSPITAL]

Comment: A cause for patient's symptoms has not been established
with this study.
